# Patient Record
Sex: Male | Born: 1940 | Race: White | Hispanic: No | Marital: Married | Smoking: Former smoker
Health system: Southern US, Community
[De-identification: ages and names within clinical notes are randomized; demographics above are authoritative.]

## PROBLEM LIST (undated history)

## (undated) DIAGNOSIS — H3530 Unspecified macular degeneration: Secondary | ICD-10-CM

## (undated) DIAGNOSIS — R918 Other nonspecific abnormal finding of lung field: Secondary | ICD-10-CM

## (undated) DIAGNOSIS — K649 Unspecified hemorrhoids: Secondary | ICD-10-CM

## (undated) DIAGNOSIS — Z9889 Other specified postprocedural states: Secondary | ICD-10-CM

## (undated) DIAGNOSIS — Z972 Presence of dental prosthetic device (complete) (partial): Secondary | ICD-10-CM

## (undated) DIAGNOSIS — C61 Malignant neoplasm of prostate: Secondary | ICD-10-CM

## (undated) DIAGNOSIS — E785 Hyperlipidemia, unspecified: Secondary | ICD-10-CM

## (undated) DIAGNOSIS — J449 Chronic obstructive pulmonary disease, unspecified: Secondary | ICD-10-CM

## (undated) DIAGNOSIS — K5730 Diverticulosis of large intestine without perforation or abscess without bleeding: Secondary | ICD-10-CM

## (undated) DIAGNOSIS — R0600 Dyspnea, unspecified: Secondary | ICD-10-CM

## (undated) DIAGNOSIS — J302 Other seasonal allergic rhinitis: Secondary | ICD-10-CM

## (undated) DIAGNOSIS — I440 Atrioventricular block, first degree: Secondary | ICD-10-CM

## (undated) DIAGNOSIS — I2510 Atherosclerotic heart disease of native coronary artery without angina pectoris: Secondary | ICD-10-CM

## (undated) DIAGNOSIS — M1990 Unspecified osteoarthritis, unspecified site: Secondary | ICD-10-CM

## (undated) DIAGNOSIS — Z923 Personal history of irradiation: Secondary | ICD-10-CM

## (undated) DIAGNOSIS — I712 Thoracic aortic aneurysm, without rupture, unspecified: Secondary | ICD-10-CM

## (undated) DIAGNOSIS — Q6100 Congenital renal cyst, unspecified: Secondary | ICD-10-CM

## (undated) DIAGNOSIS — Z86010 Personal history of colonic polyps: Secondary | ICD-10-CM

## (undated) DIAGNOSIS — T7840XA Allergy, unspecified, initial encounter: Secondary | ICD-10-CM

## (undated) DIAGNOSIS — J189 Pneumonia, unspecified organism: Secondary | ICD-10-CM

## (undated) DIAGNOSIS — I10 Essential (primary) hypertension: Secondary | ICD-10-CM

## (undated) DIAGNOSIS — Z860101 Personal history of adenomatous and serrated colon polyps: Secondary | ICD-10-CM

## (undated) DIAGNOSIS — Z85828 Personal history of other malignant neoplasm of skin: Secondary | ICD-10-CM

## (undated) DIAGNOSIS — J432 Centrilobular emphysema: Secondary | ICD-10-CM

## (undated) DIAGNOSIS — J479 Bronchiectasis, uncomplicated: Secondary | ICD-10-CM

## (undated) DIAGNOSIS — I77819 Aortic ectasia, unspecified site: Secondary | ICD-10-CM

## (undated) DIAGNOSIS — R0609 Other forms of dyspnea: Secondary | ICD-10-CM

## (undated) DIAGNOSIS — H269 Unspecified cataract: Secondary | ICD-10-CM

## (undated) DIAGNOSIS — J439 Emphysema, unspecified: Secondary | ICD-10-CM

## (undated) DIAGNOSIS — H4010X0 Unspecified open-angle glaucoma, stage unspecified: Secondary | ICD-10-CM

## (undated) DIAGNOSIS — Z87442 Personal history of urinary calculi: Secondary | ICD-10-CM

## (undated) DIAGNOSIS — C801 Malignant (primary) neoplasm, unspecified: Secondary | ICD-10-CM

## (undated) HISTORY — DX: Emphysema, unspecified: J43.9

## (undated) HISTORY — DX: Malignant (primary) neoplasm, unspecified: C80.1

## (undated) HISTORY — PX: CARDIOVASCULAR STRESS TEST: SHX262

## (undated) HISTORY — DX: Atherosclerotic heart disease of native coronary artery without angina pectoris: I25.10

## (undated) HISTORY — PX: COLONOSCOPY: SHX174

## (undated) HISTORY — PX: PROSTATE BIOPSY: SHX241

## (undated) HISTORY — PX: POLYPECTOMY: SHX149

## (undated) HISTORY — DX: Unspecified osteoarthritis, unspecified site: M19.90

## (undated) HISTORY — DX: Unspecified macular degeneration: H35.30

## (undated) HISTORY — PX: CARDIAC CATHETERIZATION: SHX172

## (undated) HISTORY — DX: Unspecified cataract: H26.9

## (undated) HISTORY — PX: SHOULDER OPEN ROTATOR CUFF REPAIR: SHX2407

## (undated) HISTORY — DX: Allergy, unspecified, initial encounter: T78.40XA

## (undated) HISTORY — PX: COLONOSCOPY W/ POLYPECTOMY: SHX1380

## (undated) HISTORY — DX: Essential (primary) hypertension: I10

## (undated) HISTORY — PX: OTHER SURGICAL HISTORY: SHX169

---

## 1999-03-12 ENCOUNTER — Other Ambulatory Visit: Admission: RE | Admit: 1999-03-12 | Discharge: 1999-03-12 | Payer: Self-pay | Admitting: Gastroenterology

## 1999-03-12 ENCOUNTER — Encounter (INDEPENDENT_AMBULATORY_CARE_PROVIDER_SITE_OTHER): Payer: Self-pay | Admitting: Specialist

## 1999-04-09 ENCOUNTER — Encounter (INDEPENDENT_AMBULATORY_CARE_PROVIDER_SITE_OTHER): Payer: Self-pay | Admitting: Specialist

## 1999-04-09 ENCOUNTER — Ambulatory Visit (HOSPITAL_COMMUNITY): Admission: RE | Admit: 1999-04-09 | Discharge: 1999-04-09 | Payer: Self-pay | Admitting: Gastroenterology

## 1999-06-19 ENCOUNTER — Encounter: Payer: Self-pay | Admitting: General Surgery

## 1999-06-24 ENCOUNTER — Encounter (INDEPENDENT_AMBULATORY_CARE_PROVIDER_SITE_OTHER): Payer: Self-pay | Admitting: Specialist

## 1999-06-24 ENCOUNTER — Ambulatory Visit (HOSPITAL_COMMUNITY): Admission: RE | Admit: 1999-06-24 | Discharge: 1999-06-24 | Payer: Self-pay | Admitting: General Surgery

## 1999-07-03 HISTORY — PX: HEMORRHOID SURGERY: SHX153

## 2000-02-11 ENCOUNTER — Ambulatory Visit (HOSPITAL_COMMUNITY): Admission: RE | Admit: 2000-02-11 | Discharge: 2000-02-11 | Payer: Self-pay | Admitting: Gastroenterology

## 2000-02-11 ENCOUNTER — Encounter (INDEPENDENT_AMBULATORY_CARE_PROVIDER_SITE_OTHER): Payer: Self-pay | Admitting: Specialist

## 2000-05-28 ENCOUNTER — Emergency Department (HOSPITAL_COMMUNITY): Admission: EM | Admit: 2000-05-28 | Discharge: 2000-05-28 | Payer: Self-pay | Admitting: Emergency Medicine

## 2000-05-28 ENCOUNTER — Encounter: Payer: Self-pay | Admitting: Emergency Medicine

## 2000-06-02 ENCOUNTER — Emergency Department (HOSPITAL_COMMUNITY): Admission: EM | Admit: 2000-06-02 | Discharge: 2000-06-02 | Payer: Self-pay | Admitting: Emergency Medicine

## 2000-06-10 ENCOUNTER — Encounter: Admission: RE | Admit: 2000-06-10 | Discharge: 2000-08-09 | Payer: Self-pay | Admitting: Internal Medicine

## 2001-02-09 ENCOUNTER — Ambulatory Visit (HOSPITAL_COMMUNITY): Admission: RE | Admit: 2001-02-09 | Discharge: 2001-02-09 | Payer: Self-pay | Admitting: Gastroenterology

## 2001-02-17 ENCOUNTER — Inpatient Hospital Stay (HOSPITAL_COMMUNITY): Admission: EM | Admit: 2001-02-17 | Discharge: 2001-02-18 | Payer: Self-pay | Admitting: Emergency Medicine

## 2001-10-12 ENCOUNTER — Encounter: Payer: Self-pay | Admitting: Internal Medicine

## 2001-10-12 ENCOUNTER — Encounter: Admission: RE | Admit: 2001-10-12 | Discharge: 2001-10-12 | Payer: Self-pay | Admitting: Internal Medicine

## 2002-04-11 ENCOUNTER — Emergency Department (HOSPITAL_COMMUNITY): Admission: EM | Admit: 2002-04-11 | Discharge: 2002-04-11 | Payer: Self-pay | Admitting: Emergency Medicine

## 2002-04-11 ENCOUNTER — Encounter: Payer: Self-pay | Admitting: Emergency Medicine

## 2002-09-04 ENCOUNTER — Encounter: Payer: Self-pay | Admitting: Cardiology

## 2002-09-04 ENCOUNTER — Ambulatory Visit (HOSPITAL_COMMUNITY): Admission: RE | Admit: 2002-09-04 | Discharge: 2002-09-04 | Payer: Self-pay | Admitting: Cardiology

## 2003-03-15 ENCOUNTER — Ambulatory Visit (HOSPITAL_COMMUNITY): Admission: RE | Admit: 2003-03-15 | Discharge: 2003-03-15 | Payer: Self-pay | Admitting: Gastroenterology

## 2003-11-23 ENCOUNTER — Ambulatory Visit: Payer: Self-pay | Admitting: Internal Medicine

## 2004-08-04 ENCOUNTER — Ambulatory Visit: Payer: Self-pay | Admitting: Internal Medicine

## 2004-10-24 ENCOUNTER — Ambulatory Visit: Payer: Self-pay | Admitting: Internal Medicine

## 2004-11-04 ENCOUNTER — Ambulatory Visit: Payer: Self-pay | Admitting: Internal Medicine

## 2005-06-17 ENCOUNTER — Ambulatory Visit: Payer: Self-pay | Admitting: Internal Medicine

## 2005-11-18 ENCOUNTER — Ambulatory Visit: Payer: Self-pay | Admitting: Internal Medicine

## 2006-01-11 ENCOUNTER — Ambulatory Visit: Payer: Self-pay | Admitting: Internal Medicine

## 2006-01-15 ENCOUNTER — Ambulatory Visit: Payer: Self-pay | Admitting: Internal Medicine

## 2006-01-15 LAB — CONVERTED CEMR LAB
Cholesterol: 109 mg/dL (ref 0–200)
HDL: 30.2 mg/dL — ABNORMAL LOW (ref >39.0)

## 2006-04-05 ENCOUNTER — Ambulatory Visit: Payer: Self-pay | Admitting: Gastroenterology

## 2006-04-12 ENCOUNTER — Encounter (INDEPENDENT_AMBULATORY_CARE_PROVIDER_SITE_OTHER): Payer: Self-pay | Admitting: Specialist

## 2006-04-12 ENCOUNTER — Ambulatory Visit: Payer: Self-pay | Admitting: Gastroenterology

## 2006-09-24 ENCOUNTER — Ambulatory Visit: Payer: Self-pay | Admitting: Internal Medicine

## 2006-09-24 DIAGNOSIS — J309 Allergic rhinitis, unspecified: Secondary | ICD-10-CM | POA: Insufficient documentation

## 2006-09-24 DIAGNOSIS — I781 Nevus, non-neoplastic: Secondary | ICD-10-CM

## 2006-09-24 DIAGNOSIS — J45990 Exercise induced bronchospasm: Secondary | ICD-10-CM | POA: Insufficient documentation

## 2006-09-24 LAB — CONVERTED CEMR LAB
HDL goal, serum: 40 mg/dL
LDL Goal: 130 mg/dL

## 2006-12-03 ENCOUNTER — Ambulatory Visit: Payer: Self-pay | Admitting: Internal Medicine

## 2006-12-03 DIAGNOSIS — N400 Enlarged prostate without lower urinary tract symptoms: Secondary | ICD-10-CM

## 2006-12-03 DIAGNOSIS — I10 Essential (primary) hypertension: Secondary | ICD-10-CM

## 2006-12-03 DIAGNOSIS — I2510 Atherosclerotic heart disease of native coronary artery without angina pectoris: Secondary | ICD-10-CM

## 2006-12-03 DIAGNOSIS — E785 Hyperlipidemia, unspecified: Secondary | ICD-10-CM

## 2006-12-04 LAB — CONVERTED CEMR LAB
ALT: 26 units/L (ref 0–53)
AST: 31 units/L (ref 0–37)
Albumin: 4.1 g/dL (ref 3.5–5.2)
Alkaline Phosphatase: 40 units/L (ref 39–117)
BUN: 18 mg/dL (ref 6–23)
Basophils Absolute: 0 10*3/uL (ref 0.0–0.1)
Basophils Relative: 0.7 % (ref 0.0–1.0)
Bilirubin, Direct: 0.2 mg/dL (ref 0.0–0.3)
CO2: 28 meq/L (ref 19–32)
Calcium: 9.2 mg/dL (ref 8.4–10.5)
Chloride: 106 meq/L (ref 96–112)
Creatinine, Ser: 1.7 mg/dL — ABNORMAL HIGH (ref 0.4–1.5)
Eosinophils Absolute: 0.2 10*3/uL (ref 0.0–0.6)
Eosinophils Relative: 3.1 % (ref 0.0–5.0)
GFR calc Af Amer: 52 mL/min
GFR calc non Af Amer: 43 mL/min
Glucose, Bld: 94 mg/dL (ref 70–99)
HCT: 40.8 % (ref 39.0–52.0)
Hemoglobin: 14.6 g/dL (ref 13.0–17.0)
Lymphocytes Relative: 37.7 % (ref 12.0–46.0)
MCHC: 35.7 g/dL (ref 30.0–36.0)
MCV: 91.3 fL (ref 78.0–100.0)
Monocytes Absolute: 0.5 10*3/uL (ref 0.2–0.7)
Monocytes Relative: 7.9 % (ref 3.0–11.0)
Neutro Abs: 3.6 10*3/uL (ref 1.4–7.7)
Neutrophils Relative %: 50.6 % (ref 43.0–77.0)
PSA: 0.95 ng/mL (ref 0.10–4.00)
Platelets: 218 10*3/uL (ref 150–400)
Potassium: 3.4 meq/L — ABNORMAL LOW (ref 3.5–5.1)
RBC: 4.47 M/uL (ref 4.22–5.81)
RDW: 11.8 % (ref 11.5–14.6)
Sodium: 140 meq/L (ref 135–145)
TSH: 1.62 microintl units/mL (ref 0.35–5.50)
Total Bilirubin: 1.1 mg/dL (ref 0.3–1.2)
Total Protein: 7 g/dL (ref 6.0–8.3)
WBC: 6.9 10*3/uL (ref 4.5–10.5)

## 2006-12-06 ENCOUNTER — Encounter (INDEPENDENT_AMBULATORY_CARE_PROVIDER_SITE_OTHER): Payer: Self-pay | Admitting: *Deleted

## 2007-01-28 ENCOUNTER — Telehealth (INDEPENDENT_AMBULATORY_CARE_PROVIDER_SITE_OTHER): Payer: Self-pay | Admitting: *Deleted

## 2007-03-02 ENCOUNTER — Ambulatory Visit: Payer: Self-pay | Admitting: Internal Medicine

## 2007-03-28 ENCOUNTER — Ambulatory Visit: Payer: Self-pay | Admitting: Internal Medicine

## 2007-04-11 ENCOUNTER — Telehealth (INDEPENDENT_AMBULATORY_CARE_PROVIDER_SITE_OTHER): Payer: Self-pay | Admitting: *Deleted

## 2007-04-13 ENCOUNTER — Ambulatory Visit: Payer: Self-pay | Admitting: Internal Medicine

## 2007-04-14 ENCOUNTER — Encounter: Payer: Self-pay | Admitting: Internal Medicine

## 2007-04-14 ENCOUNTER — Encounter (INDEPENDENT_AMBULATORY_CARE_PROVIDER_SITE_OTHER): Payer: Self-pay | Admitting: *Deleted

## 2007-04-14 ENCOUNTER — Ambulatory Visit: Payer: Self-pay

## 2007-04-20 ENCOUNTER — Encounter (INDEPENDENT_AMBULATORY_CARE_PROVIDER_SITE_OTHER): Payer: Self-pay | Admitting: *Deleted

## 2007-04-28 ENCOUNTER — Telehealth (INDEPENDENT_AMBULATORY_CARE_PROVIDER_SITE_OTHER): Payer: Self-pay | Admitting: *Deleted

## 2007-05-26 ENCOUNTER — Ambulatory Visit: Payer: Self-pay | Admitting: Internal Medicine

## 2007-05-26 LAB — CONVERTED CEMR LAB: BUN: 18 mg/dL (ref 6–23)

## 2007-05-31 ENCOUNTER — Encounter: Payer: Self-pay | Admitting: Internal Medicine

## 2007-06-02 ENCOUNTER — Encounter (INDEPENDENT_AMBULATORY_CARE_PROVIDER_SITE_OTHER): Payer: Self-pay | Admitting: *Deleted

## 2007-08-25 ENCOUNTER — Telehealth (INDEPENDENT_AMBULATORY_CARE_PROVIDER_SITE_OTHER): Payer: Self-pay | Admitting: *Deleted

## 2007-08-30 ENCOUNTER — Telehealth (INDEPENDENT_AMBULATORY_CARE_PROVIDER_SITE_OTHER): Payer: Self-pay | Admitting: *Deleted

## 2007-10-28 ENCOUNTER — Telehealth (INDEPENDENT_AMBULATORY_CARE_PROVIDER_SITE_OTHER): Payer: Self-pay | Admitting: *Deleted

## 2007-11-03 ENCOUNTER — Telehealth: Payer: Self-pay | Admitting: Internal Medicine

## 2007-12-13 ENCOUNTER — Ambulatory Visit: Payer: Self-pay | Admitting: Internal Medicine

## 2007-12-13 DIAGNOSIS — Z860100 Personal history of colon polyps, unspecified: Secondary | ICD-10-CM | POA: Insufficient documentation

## 2007-12-13 LAB — CONVERTED CEMR LAB
Glucose, Urine, Semiquant: NEGATIVE
Nitrite: NEGATIVE
pH: 5

## 2007-12-15 ENCOUNTER — Encounter (INDEPENDENT_AMBULATORY_CARE_PROVIDER_SITE_OTHER): Payer: Self-pay | Admitting: *Deleted

## 2007-12-15 LAB — CONVERTED CEMR LAB
ALT: 34 units/L (ref 0–53)
Albumin: 4.3 g/dL (ref 3.5–5.2)
Alkaline Phosphatase: 46 units/L (ref 39–117)
Basophils Absolute: 0.1 10*3/uL (ref 0.0–0.1)
Bilirubin, Direct: 0.1 mg/dL (ref 0.0–0.3)
Cholesterol: 136 mg/dL (ref 0–200)
Eosinophils Absolute: 0.1 10*3/uL (ref 0.0–0.7)
GFR calc Af Amer: 60 mL/min
GFR calc non Af Amer: 50 mL/min
Glucose, Bld: 86 mg/dL (ref 70–99)
HCT: 43.9 % (ref 39.0–52.0)
LDL Cholesterol: 71 mg/dL (ref 0–99)
Lymphocytes Relative: 36.9 % (ref 12.0–46.0)
MCV: 94.6 fL (ref 78.0–100.0)
Monocytes Relative: 8.1 % (ref 3.0–12.0)
Neutro Abs: 3.5 10*3/uL (ref 1.4–7.7)
Neutrophils Relative %: 51.9 % (ref 43.0–77.0)
PSA: 1.31 ng/mL (ref 0.10–4.00)
Platelets: 202 10*3/uL (ref 150–400)
TSH: 1.91 microintl units/mL (ref 0.35–5.50)
Total Bilirubin: 1.4 mg/dL — ABNORMAL HIGH (ref 0.3–1.2)
Total CHOL/HDL Ratio: 3.7
Triglycerides: 141 mg/dL (ref 0–149)

## 2008-01-02 ENCOUNTER — Ambulatory Visit: Payer: Self-pay | Admitting: Internal Medicine

## 2008-01-02 LAB — CONVERTED CEMR LAB: OCCULT 3: NEGATIVE

## 2008-01-03 ENCOUNTER — Encounter (INDEPENDENT_AMBULATORY_CARE_PROVIDER_SITE_OTHER): Payer: Self-pay | Admitting: *Deleted

## 2008-03-08 ENCOUNTER — Telehealth (INDEPENDENT_AMBULATORY_CARE_PROVIDER_SITE_OTHER): Payer: Self-pay | Admitting: *Deleted

## 2008-05-22 ENCOUNTER — Telehealth (INDEPENDENT_AMBULATORY_CARE_PROVIDER_SITE_OTHER): Payer: Self-pay | Admitting: *Deleted

## 2008-05-24 ENCOUNTER — Telehealth (INDEPENDENT_AMBULATORY_CARE_PROVIDER_SITE_OTHER): Payer: Self-pay | Admitting: *Deleted

## 2008-12-03 ENCOUNTER — Telehealth (INDEPENDENT_AMBULATORY_CARE_PROVIDER_SITE_OTHER): Payer: Self-pay | Admitting: *Deleted

## 2009-01-15 ENCOUNTER — Telehealth (INDEPENDENT_AMBULATORY_CARE_PROVIDER_SITE_OTHER): Payer: Self-pay | Admitting: *Deleted

## 2009-02-21 ENCOUNTER — Telehealth (INDEPENDENT_AMBULATORY_CARE_PROVIDER_SITE_OTHER): Payer: Self-pay | Admitting: *Deleted

## 2009-02-21 ENCOUNTER — Ambulatory Visit: Payer: Self-pay | Admitting: Internal Medicine

## 2009-02-21 DIAGNOSIS — N529 Male erectile dysfunction, unspecified: Secondary | ICD-10-CM

## 2009-02-21 LAB — CONVERTED CEMR LAB
Nitrite: NEGATIVE
Specific Gravity, Urine: 1.02
WBC Urine, dipstick: NEGATIVE
pH: 5

## 2009-02-25 LAB — CONVERTED CEMR LAB
ALT: 29 units/L (ref 0–53)
BUN: 17 mg/dL (ref 6–23)
Calcium: 9.2 mg/dL (ref 8.4–10.5)
Cholesterol: 137 mg/dL (ref 0–200)
Direct LDL: 74.7 mg/dL
GFR calc non Af Amer: 49.55 mL/min (ref >60)
Glucose, Bld: 96 mg/dL (ref 70–99)
Lymphs Abs: 1.9 10*3/uL (ref 0.7–4.0)
MCV: 91.1 fL (ref 78.0–100.0)
Monocytes Relative: 9.8 % (ref 3.0–12.0)
Neutro Abs: 2.9 10*3/uL (ref 1.4–7.7)
Neutrophils Relative %: 50.3 % (ref 43.0–77.0)
PSA: 1.72 ng/mL (ref 0.10–4.00)
Platelets: 232 10*3/uL (ref 150.0–400.0)
RDW: 12.1 % (ref 11.5–14.6)
Sodium: 141 meq/L (ref 135–145)
TSH: 3.48 microintl units/mL (ref 0.35–5.50)
Total Protein: 7.4 g/dL (ref 6.0–8.3)
Triglycerides: 231 mg/dL — ABNORMAL HIGH (ref 0.0–149.0)
VLDL: 46.2 mg/dL — ABNORMAL HIGH (ref 0.0–40.0)
WBC: 5.7 10*3/uL (ref 4.5–10.5)

## 2009-03-07 ENCOUNTER — Ambulatory Visit: Payer: Self-pay | Admitting: Internal Medicine

## 2009-03-07 ENCOUNTER — Encounter (INDEPENDENT_AMBULATORY_CARE_PROVIDER_SITE_OTHER): Payer: Self-pay | Admitting: *Deleted

## 2009-03-07 LAB — CONVERTED CEMR LAB
OCCULT 1: NEGATIVE
OCCULT 2: NEGATIVE
OCCULT 3: NEGATIVE

## 2009-03-19 ENCOUNTER — Telehealth (INDEPENDENT_AMBULATORY_CARE_PROVIDER_SITE_OTHER): Payer: Self-pay | Admitting: *Deleted

## 2009-04-08 ENCOUNTER — Ambulatory Visit: Payer: Self-pay | Admitting: Internal Medicine

## 2009-04-08 DIAGNOSIS — J069 Acute upper respiratory infection, unspecified: Secondary | ICD-10-CM | POA: Insufficient documentation

## 2009-04-15 ENCOUNTER — Telehealth (INDEPENDENT_AMBULATORY_CARE_PROVIDER_SITE_OTHER): Payer: Self-pay | Admitting: *Deleted

## 2009-05-16 ENCOUNTER — Encounter: Payer: Self-pay | Admitting: Internal Medicine

## 2009-10-01 ENCOUNTER — Telehealth (INDEPENDENT_AMBULATORY_CARE_PROVIDER_SITE_OTHER): Payer: Self-pay | Admitting: *Deleted

## 2009-12-16 ENCOUNTER — Telehealth (INDEPENDENT_AMBULATORY_CARE_PROVIDER_SITE_OTHER): Payer: Self-pay | Admitting: *Deleted

## 2010-01-27 ENCOUNTER — Telehealth: Payer: Self-pay | Admitting: Internal Medicine

## 2010-02-11 NOTE — Letter (Signed)
Summary: Viann Fish MD Cardiology  Viann Fish MD Cardiology   Imported By: Lanelle Bal 05/27/2009 09:15:22  _____________________________________________________________________  External Attachment:    Type:   Image     Comment:   External Document

## 2010-02-11 NOTE — Progress Notes (Signed)
Summary: refill request  Phone Note Refill Request Call back at [phone] Message from:  Pharmacy on October 01, 2009 9:49 AM  Refills Requested: Medication #1:  FENOFIBRATE 200 MG  CAPS take one capsule by mouth each morning   Dosage confirmed as above?Dosage Confirmed   Supply Requested: 1 month   Last Refilled: 08/28/2009 [facility]  Next Appointment Scheduled: none Initial call taken by: Lavell Islam,  October 01, 2009 9:52 AM    Prescriptions: FENOFIBRATE 200 MG  CAPS (FENOFIBRATE MICRONIZED) take one capsule by mouth each morning  #30 x 5   Entered by:   Shonna Chock CMA   Authorized by:   Marga Melnick MD   Signed by:   Shonna Chock CMA on 10/01/2009   Method used:   Faxed to ...       [facility] (retail)       [address] [place]       [address]       [place], [place]  [address]       Ph: [phone]       Fax: [phone]   RxID:   [id number]

## 2010-02-11 NOTE — Assessment & Plan Note (Signed)
Summary: CHEST CONGESTION?/KDC   Vital Signs:  Patient profile:   70 year old male Weight:      228.6 pounds Temp:     98.7 degrees F oral Pulse rate:   72 / minute Resp:     15 per minute BP sitting:   124 / 80  (left arm) Cuff size:   large  Vitals Entered By: Shonna Chock (April 08, 2009 11:38 AM) CC: Cough(productive-discolored) and chest congestion since last Wed Comments REVIEWED MED LIST, PATIENT AGREED DOSE AND INSTRUCTION CORRECT    CC:  Cough(productive-discolored) and chest congestion since last Wed.  History of Present Illness: Onset 04/03/2009 as head & chest congestion with thick green mucus & DOE. Some PNDyspnea X 4-5 days . Associated  are wheezing , snoring, pleuritic chest pain & tightness and  fatigue. No f,c, or sweats.Pain resolves with productive cough. Quit smoking in 1972. PMH of EIB. PMH of PNA X 3, last in 2002. Rx: Mucinex , Neti pot w/o benefit. Flu shot & PNA vaccine up to date.  Allergies: 1)  ! Sulfa 2)  ! Codeine  Review of Systems General:  Denies chills, fever, and sweats. ENT:  Complains of nasal congestion; denies sinus pressure; No frontal headache, facial pain . Yellow from head; dental pain.. CV:  Complains of difficulty breathing at night and difficulty breathing while lying down. Resp:  Complains of chest pain with inspiration, pleuritic, shortness of breath, sputum productive, and wheezing; denies coughing up blood. MS:  Denies muscle aches.  Physical Exam  General:  Non toxic appearing,well-nourished,in no acute distress; alert,appropriate and cooperative throughout examination Ears:  External ear exam shows no significant lesions or deformities.  Otoscopic examination reveals clear canals, tympanic membranes are intact bilaterally without bulging, retraction, inflammation or discharge. Hearing is grossly normal bilaterally. Nose:  External nasal examination shows no deformity or inflammation. Nasal mucosa are pink and moist without  lesions or exudates. septal dislocation Mouth:  Oral mucosa and oropharynx without lesions or exudates.  Upper plate, lower partial. Mild pharyngeal erythema.   Lungs:  normal respiratory effort, no intercostal retractions, no accessory muscle use, R base crackles, and L base crackles. No whispered pectroriloquy or "E to A " changes.Dry cough; no increased WOB. O2 sats 98%   Heart:  Normal rate and regular rhythm. S1 and S2 normal without gallop, murmur, click, rub . S4 with slurring Cervical Nodes:  No lymphadenopathy noted Axillary Nodes:  No palpable lymphadenopathy Psych:  memory intact for recent and remote, normally interactive, and good eye contact.     Impression & Recommendations:  Problem # 1:  BRONCHITIS-ACUTE (ICD-466.0)  Probable RAD component His updated medication list for this problem includes:    Ventolin Hfa 108 (90 Base) Mcg/act Aers (Albuterol sulfate) .Marland Kitchen... 1 -2 puffs q 4 hrs as needed    Azithromycin 250 Mg Tabs (Azithromycin) .Marland Kitchen... As per pack    Cefuroxime Axetil 500 Mg Tabs (Cefuroxime axetil) .Marland Kitchen... 1 two times a day    Proair Hfa 108 (90 Base) Mcg/act Aers (Albuterol sulfate) .Marland Kitchen... 1-2 puffs every 4 hrs as needed    Symbicort 160-4.5 Mcg/act Aero (Budesonide-formoterol fumarate) .Marland Kitchen... 1-2 puffs every 12 hrs; gargle & spit after use  Orders: T-2 View CXR (71020TC)  Problem # 2:  URI (ICD-465.9)  Orders: T-2 View CXR (71020TC)  Complete Medication List: 1)  Fenofibrate 200 Mg Caps (Fenofibrate micronized) .... Take one capsule by mouth each morning 2)  Vytorin 10-40 Mg Tabs (Ezetimibe-simvastatin) .... Take one  tablet daily 3)  Levitra 20 Mg Tabs (Vardenafil hcl) .... 1/2-1 by mouth as needed 4)  Garlic  .... 2 once daily 5)  Fish Oil 1200 Mg Caps (Omega-3 fatty acids) .... 2 by mouth once daily 6)  Flax Seed 1300mg   .... 2 once daily 7)  B Complex  .... Once daily 8)  Baby Asa 81mg   .... 1 once daily 9)  Mucus Prn  10)  Ventolin Hfa 108 (90 Base)  Mcg/act Aers (Albuterol sulfate) .Marland Kitchen.. 1 -2 puffs q 4 hrs as needed 11)  Metoprolol Succinate 100 Mg Xr24h-tab (Metoprolol succinate) .... 1/4 of a tab daily 12)  Multivitamins Tabs (Multiple vitamin) .Marland Kitchen.. 1 by mouth once daily 13)  Vitamin C 1000 Mg Tabs (Ascorbic acid) .... 2am, 2pm 14)  Cyclobenzaprine Hcl 5 Mg Tabs (Cyclobenzaprine hcl) .... Take 1 to 2 tablets at bedtime as needed 15)  Coq 300mg   .... 1 by mouth once daily 16)  Azithromycin 250 Mg Tabs (Azithromycin) .... As per pack 17)  Cefuroxime Axetil 500 Mg Tabs (Cefuroxime axetil) .Marland Kitchen.. 1 two times a day 18)  Proair Hfa 108 (90 Base) Mcg/act Aers (Albuterol sulfate) .Marland Kitchen.. 1-2 puffs every 4 hrs as needed 19)  Symbicort 160-4.5 Mcg/act Aero (Budesonide-formoterol fumarate) .Marland Kitchen.. 1-2 puffs every 12 hrs; gargle & spit after use  Patient Instructions: 1)  Symbicort 2 puffs every 12 hrs ; gargle & spit after use.Pro Air 1-2 puffs every 4 hrs as needed wheezing. 2)  Drink as much fluid as you can tolerate for the next few days. Prescriptions: PROAIR HFA 108 (90 BASE) MCG/ACT AERS (ALBUTEROL SULFATE) 1-2 puffs every 4 hrs as needed  #1 x 0   Entered and Authorized by:   Marga Melnick MD   Signed by:   Marga Melnick MD on 04/08/2009   Method used:   Historical   RxID:   [id number] CEFUROXIME AXETIL 500 MG TABS (CEFUROXIME AXETIL) 1 two times a day  #14 x 0   Entered and Authorized by:   Marga Melnick MD   Signed by:   Marga Melnick MD on 04/08/2009   Method used:   Faxed to ...       [facility] (retail)       [address] [place]       [address]       [place], [place]  [address]       Ph: [phone]       Fax: [phone]   RxID:   [id number] AZITHROMYCIN 250 MG TABS (AZITHROMYCIN) as per pack  #1 x 0   Entered and Authorized by:   Marga Melnick MD   Signed by:   Marga Melnick MD on 04/08/2009   Method used:   Faxed to ...       [facility] (retail)       [address] [place]       [address]        [place], [place]  [address]       Ph: [phone]       Fax: [phone]   RxID:   [id number]

## 2010-02-11 NOTE — Progress Notes (Signed)
Summary: REFILL  Phone Note Refill Request Message from:  Fax from Pharmacy on April 15, 2009 12:00 PM  Refills Requested: Medication #1:  CEFUROXIME AXETIL 500 MG TABS 1 two times a day [facility] FAX [phone]   Method Requested: Fax to Local Pharmacy Next Appointment Scheduled: NO APPT Initial call taken by: Barb Merino,  April 15, 2009 12:00 PM  Follow-up for Phone Call        Left message for patient to return call when avaliable, reason for call:  ? what symptoms he is current having, the refill request is for an antibiotic (which we dont usually refill-unless valid reason) Follow-up by: Shonna Chock,  April 16, 2009 10:17 AM  Additional Follow-up for Phone Call Additional follow up Details #1::        Patient called to say he still has a lot of Head congestion and coughing up green stuff and thats why he feels he needs another round of ABX.  Dr.Hopper please advise Additional Follow-up by: Shonna Chock,  April 16, 2009 1:24 PM

## 2010-02-11 NOTE — Letter (Signed)
Summary: Results Follow up Letter  [facility]  [address] [place], [place] [address]   Phone: [phone]  Fax: [phone]    03/07/2009 MRN: [id number]  [facility] [address] [place], [place]  [address]  Dear Mr. BERGQUIST,  The following are the results of your recent test(s):  Test         Result    Pap Smear:        Normal _____  Not Normal _____ Comments: ______________________________________________________ Cholesterol: LDL(Bad cholesterol):         Your goal is less than:         HDL (Good cholesterol):       Your goal is more than: Comments:  ______________________________________________________ Mammogram:        Normal _____  Not Normal _____ Comments:  ___________________________________________________________________ Hemoccult:        Normal _X____  Not normal _______ Comments:    _____________________________________________________________________ Other Tests:    We routinely do not discuss normal results over the telephone.  If you desire a copy of the results, or you have any questions about this information we can discuss them at your next office visit.   Sincerely,

## 2010-02-11 NOTE — Assessment & Plan Note (Signed)
Summary: YEARLY EXAM, FASTING LABS///SPH---PER INS REP, DONT CHARGE COPAY   Vital Signs:  Patient profile:   70 year old male Height:      72.5 inches Weight:      225 pounds BMI:     30.20 Temp:     98.8 degrees F oral Pulse rate:   59 / minute Resp:     14 per minute BP sitting:   102 / 66  (left arm) Cuff size:   large  Vitals Entered By: Shonna Chock (February 21, 2009 8:19 AM)  Comments REVIEWED MED LIST, PATIENT AGREED DOSE AND INSTRUCTION CORRECT  Flu Vaccine Consent Questions     Do you have a history of severe allergic reactions to this vaccine? no    Any prior history of allergic reactions to egg and/or gelatin? no    Do you have a sensitivity to the preservative Thimersol? no    Do you have a past history of Guillan-Barre Syndrome? no    Do you currently have an acute febrile illness? no    Have you ever had a severe reaction to latex? no    Vaccine information given and explained to patient? yes    Are you currently pregnant? no    Lot Number:[id number]   Exp Date:07/11/2009   Site Given Right Deltoid IM    History of Present Illness: Mr Nuttall is here for a [employer]/ [employer] physical. Last week he had fever & myalgias/ arthralgias (2on 10 nscale); resolution with NSAIDS & Mucinex. No Flu shot.  Preventive Screening-Counseling & Management  Alcohol-Tobacco     Smoking Status: quit  Allergies: 1)  ! Sulfa 2)  ! Codeine  Past History:  Past Medical History: HYPERPLASIA PROSTATE UNS W/O UR OBST & OTH LUTS (ICD-600.90) C A D (ICD-414.00) HYPERTENSION (ICD-401.9) HYPERLIPIDEMIA (ICD-272.4) NEVUS, NON-NEOPLASTIC (ICD-448.1) RHINITIS, ALLERGIC NOS (ICD-477.9), Immunothrapy, PMH of  from  Dr  Corinda Gubler EXERCISE INDUCED ASTHMA (ICD-493.81) Colonic polyps, hx of, hyperplastic  Past Surgical History: Catheterization  2004 : 4 vessel disease, Dr Viann Fish Colon polypectomy 2008, Dr Jarold Motto, due 2011 Shoulder surgery RX2; L X1, Dr  Thomasena Edis Vasectomy Hemorrhoidectomy  Family History: Father: CVA, pacer Mother:d @  46 intraoperatively ,Gyn surgery for dysfunctional menses Siblings: bro MI @ 33, bro 2 stents; P uncle DM  Social History: [place]( modified) diet Married Alcohol use-no Regular exercise-no Retired Former Smoker: quit 1972  Review of Systems General:  Complains of sweats; denies chills and fever; Night sweats X 2 nights. Eyes:  Denies blurring, double vision, and vision loss-both eyes. ENT:  Denies difficulty swallowing, hoarseness, nasal congestion, sinus pressure, and sore throat; Intermittent R max sinus pressure. No purulence, facial pain , or frontal headache. CV:  Complains of shortness of breath with exertion; denies chest pain or discomfort, leg cramps with exertion, palpitations, swelling of feet, and swelling of hands. Resp:  Denies cough and sputum productive; Minor wheezing with recent URI. GI:  Denies bloody stools, constipation, dark tarry stools, diarrhea, indigestion, nausea, and vomiting; Intermittent brief lower abd pain past week. Occa loose stool X 1 month. GU:  Denies discharge, dysuria, hematuria, and nocturia. MS:  Complains of low back pain; denies joint pain, joint redness, joint swelling, mid back pain, and thoracic pain. Derm:  Denies changes in nail beds, dryness, and lesion(s). Neuro:  Complains of numbness and tingling; Occa N&T LUE . Psych:  Denies anxiety and depression. Endo:  Denies cold intolerance, excessive hunger, excessive thirst, excessive urination, and  heat intolerance. Heme:  Denies abnormal bruising and bleeding. Allergy:  Complains of itching eyes and sneezing; Perennial symptoms no better with Allergy shots.  Physical Exam  General:  well-nourished,in no acute distress; alert,appropriate and cooperative throughout examination Head:  Normocephalic and atraumatic without obvious abnormalities. Pattern  alopecia  Eyes:  No corneal or conjunctival  inflammation noted. Perrla. Funduscopic exam benign, without hemorrhages, exudates or papilledema. Slight ptosis bilaterally Ears:  External ear exam shows no significant lesions or deformities.  Otoscopic examination reveals clear canals, tympanic membranes are intact bilaterally without bulging, retraction, inflammation or discharge. Hearing is grossly normal bilaterally. Nose:  External nasal examination shows no deformity or inflammation. Nasal mucosa are pink and moist without lesions or exudates. septal dislocation to L Mouth:  Oral mucosa and oropharynx without lesions or exudates.  Upper plate; lower partial Neck:  No deformities, masses, or tenderness noted. Chest Wall:  R inferior thorax higher than L Lungs:  Normal respiratory effort, chest expands symmetrically. Lungs are clear to auscultation, no crackles or wheezes. Heart:  regular rhythm and bradycardia.   Slight slurring S2 Abdomen:  Bowel sounds positive,abdomen soft and non-tender without masses, organomegaly or hernias noted. Rectal:  No external abnormalities noted. Normal sphincter tone. No rectal masses or tenderness. Genitalia:  Testes bilaterally descended without nodularity, tenderness or masses. No scrotal masses or lesions. No penis lesions or urethral discharge. Prostate:  no nodules, no asymmetry, no induration, but  1+ enlarged.   Msk:  No deformity or scoliosis noted of thoracic or lumbar spine.   Pulses:  R and L carotid,radial,dorsalis pedis and posterior tibial pulses are full and equal bilaterally Extremities:  No clubbing, cyanosis, edema, or deformity noted with normal full range of motion of all joints.   Neurologic:  alert & oriented X3 and DTRs symmetrical and normal.   Skin:  Intact without suspicious lesions or rashes. Minor lipomata subcutaneously  Cervical Nodes:  No lymphadenopathy noted Axillary Nodes:  No palpable lymphadenopathy Inguinal Nodes:  No significant adenopathy Psych:  memory intact for  recent and remote, normally interactive, and good eye contact.     Impression & Recommendations:  Problem # 1:  PREVENTIVE HEALTH CARE (ICD-V70.0)  Orders: EKG w/ Interpretation ([address]) Venipuncture ([address]) TLB-Lipid Panel (80061-LIPID) TLB-BMP (Basic Metabolic Panel-BMET) (80048-METABOL) TLB-CBC Platelet - w/Differential (85025-CBCD) TLB-Hepatic/Liver Function Pnl (80076-HEPATIC) TLB-TSH (Thyroid Stimulating Hormone) (84443-TSH) TLB-PSA (Prostate Specific Antigen) (84153-PSA) UA Dipstick w/o Micro (manual) ([address])  Problem # 2:  C A D (ICD-414.00)  as per Dr Donnie Aho His updated medication list for this problem includes:    Metoprolol Succinate 100 Mg Xr24h-tab (Metoprolol succinate) .Marland Kitchen... 1/4 of a tab daily  Orders: EKG w/ Interpretation (93[address]) Venipuncture ([address])  Problem # 3:  HYPERTENSION (ICD-401.9)  Controlled His updated medication list for this problem includes:    Metoprolol Succinate 100 Mg Xr24h-tab (Metoprolol succinate) .Marland Kitchen... 1/4 of a tab daily  Orders: EKG w/ Interpretation (93[address]) Venipuncture (91478)  Problem # 4:  HYPERLIPIDEMIA (ICD-272.4)  His updated medication list for this problem includes:    Fenofibrate 200 Mg Caps (Fenofibrate micronized) .Marland Kitchen... Take one capsule by mouth each morning    Vytorin 10-40 Mg Tabs (Ezetimibe-simvastatin) .Marland Kitchen... Take one tablet daily  Orders: EKG w/ Interpretation (93[address]) Venipuncture ([address]) TLB-Lipid Panel (80061-LIPID)  Problem # 5:  HYPERPLASIA PROSTATE UNS W/O UR OBST & OTH LUTS (ICD-600.90)  Orders: EKG w/ Interpretation (93[address]) Venipuncture (13086) TLB-PSA (Prostate Specific Antigen) (84153-PSA)  Problem # 6:  ERECTILE DYSFUNCTION, ORGANIC (ICD-607.84)  His  updated medication list for this problem includes:    Levitra 20 Mg Tabs (Vardenafil hcl) .Marland Kitchen... 1/2-1 by mouth as needed  Orders: EKG w/ Interpretation (93000) Venipuncture ([address])  Problem # 7:  COLONIC POLYPS, HX OF (ICD-V12.72)  as per  Dr Jarold Motto  Orders: EKG w/ Interpretation (93[address]) Venipuncture ([address])  Complete Medication List: 1)  Fenofibrate 200 Mg Caps (Fenofibrate micronized) .... Take one capsule by mouth each morning 2)  Vytorin 10-40 Mg Tabs (Ezetimibe-simvastatin) .... Take one tablet daily 3)  Levitra 20 Mg Tabs (Vardenafil hcl) .... 1/2-1 by mouth as needed 4)  Garlic  .... 2 once daily 5)  Fish Oil 1200 Mg Caps (Omega-3 fatty acids) .... 2 by mouth once daily 6)  Flax Seed 1300mg   .... 2 once daily 7)  B Complex  .... Once daily 8)  Baby Asa 81mg   .... 1 once daily 9)  Mucus Prn  10)  Ventolin Hfa 108 (90 Base) Mcg/act Aers (Albuterol sulfate) .Marland Kitchen.. 1 -2 puffs q 4 hrs as needed 11)  Metoprolol Succinate 100 Mg Xr24h-tab (Metoprolol succinate) .... 1/4 of a tab daily 12)  Multivitamins Tabs (Multiple vitamin) .Marland Kitchen.. 1 by mouth once daily 13)  Vitamin C 1000 Mg Tabs (Ascorbic acid) .... 2am, 2pm 14)  Cyclobenzaprine Hcl 5 Mg Tabs (Cyclobenzaprine hcl) .... Take 1 to 2 tablets at bedtime as needed 15)  Coq 300mg   .... 1 by mouth once daily  Other Orders: Flu Vaccine 16yrs + ([address]) Administration Flu vaccine - MCR (B1478)  Patient Instructions: 1)  Check on cost of Crestor 20 mg once daily on your plan. Do not take Levitra  with NTG as discussed. Flu shot annually. Consider Shingles shot. Prescriptions: VIAGRA 100 MG  TABS (SILDENAFIL CITRATE) take one half or one tablet as needed  #6 x 6   Entered and Authorized by:   Marga Melnick MD   Signed by:   Marga Melnick MD on 02/21/2009   Method used:   Print then Give to Patient   RxID:   [id number]   Laboratory Results   Urine Tests   Date/Time Reported: February 21, 2009 9:38 AM   Routine Urinalysis   Color: yellow Appearance: Clear Glucose: negative   (Normal Range: Negative) Bilirubin: negative   (Normal Range: Negative) Ketone: negative   (Normal Range: Negative) Spec. Gravity: 1.020   (Normal Range: 1.003-1.035) Blood: negative    (Normal Range: Negative) pH: 5.0   (Normal Range: 5.0-8.0) Protein: negative   (Normal Range: Negative) Urobilinogen: negative   (Normal Range: 0-1) Nitrite: negative   (Normal Range: Negative) Leukocyte Esterace: negative   (Normal Range: Negative)    Comments: Floydene Flock  February 21, 2009 9:38 AM

## 2010-02-11 NOTE — Progress Notes (Signed)
Summary: REFILL  Phone Note Refill Request Message from:  Fax from Pharmacy on March 19, 2009 9:15 AM  Refills Requested: Medication #1:  FENOFIBRATE 200 MG  CAPS take one capsule by mouth each morning Geoffery Lyons [phone]   Method Requested: Fax to Local Pharmacy Next Appointment Scheduled: NO APPT Initial call taken by: Barb Merino,  March 19, 2009 9:15 AM    Prescriptions: FENOFIBRATE 200 MG  CAPS (FENOFIBRATE MICRONIZED) take one capsule by mouth each morning  #30 x 5   Entered by:   Shonna Chock   Authorized by:   Marga Melnick MD   Signed by:   Shonna Chock on 03/19/2009   Method used:   Faxed to ...       [facility] (retail)       [address] [place]       [address]       [place], [place]  [address]       Ph: [phone]       Fax: [phone]   RxID:   [id number]

## 2010-02-11 NOTE — Progress Notes (Signed)
Summary: Request for Med Change  Phone Note From Pharmacy Call back at [phone]   Caller: [facility] Summary of Call: Message left on VM: patient was given coupon for Viagra and it is expired. Patient would like to know if he could be swithched to Levitra or Cialis  Dr.Hopper please advise on med change./[facility]  February 21, 2009 4:08 PM   Follow-up for Phone Call        Sorry  about coupon. Levitra 20 mg # free allowed Follow-up by: Marga Melnick MD,  February 22, 2009 1:40 PM  Additional Follow-up for Phone Call Additional follow up Details #1::        Called the pharmacy and left RX change from Viagra to Levitra Additional Follow-up by: Shonna Chock,  February 22, 2009 2:03 PM    New/Updated Medications: LEVITRA 20 MG TABS (VARDENAFIL HCL) 1/2-1 by mouth as needed

## 2010-02-11 NOTE — Progress Notes (Signed)
Summary: REFILL  Phone Note Refill Request Message from:  Fax from Pharmacy on [facility] [phone]  Refills Requested: Medication #1:  FENOFIBRATE 200 MG  CAPS take one capsule by mouth each morning Initial call taken by: Barb Merino,  January 15, 2009 11:01 AM    Prescriptions: FENOFIBRATE 200 MG  CAPS (FENOFIBRATE MICRONIZED) take one capsule by mouth each morning  #30 x 1   Entered by:   Shonna Chock   Authorized by:   Marga Melnick MD   Signed by:   Shonna Chock on 01/15/2009   Method used:   Faxed to ...       [facility] (retail)       [address] [place]       [address]       [place], [place]  [address]       Ph: [phone]       Fax: [phone]   RxID:   [id number]

## 2010-02-11 NOTE — Progress Notes (Signed)
Summary: metoprolol refill   Phone Note Refill Request Message from:  Fax from Pharmacy on December 16, 2009 2:05 PM  Refills Requested: Medication #1:  METOPROLOL SUCCINATE 100 MG XR24H-TAB 1/4 OF A TAB DAILY [facility] fax [phone]  Initial call taken by: Okey Regal [place],  December 16, 2009 2:05 PM    Prescriptions: METOPROLOL SUCCINATE 100 MG XR24H-TAB (METOPROLOL SUCCINATE) 1/4 OF A TAB DAILY  #30 x 1   Entered by:   Doristine Devoid CMA   Authorized by:   Marga Melnick MD   Signed by:   Doristine Devoid CMA on 12/16/2009   Method used:   Faxed to ...       [facility] (retail)       [address] [place]       [address]       [place], [place]  [address]       Ph: [phone]       Fax: [phone]   RxID:   [id number]

## 2010-02-13 NOTE — Progress Notes (Signed)
Summary: Rx request  Phone Note Call from Patient Call back at Home Phone [phone]   Caller: Patient Call For: Marga Melnick MD Summary of Call: Patient is having the same symptoms his wife had on Friday and wants to know if Alfonse Flavors will call in an antibiotic.  Symptoms are Drainage, nausea, diarrhea.  Pharmacy: [facility]  Initial call taken by: Barnie Mort,  January 27, 2010 2:02 PM  Follow-up for Phone Call        antibiotics will very likely aggravate diarrhea; he should be seen if having fever or purulent secretions Follow-up by: Marga Melnick MD,  January 27, 2010 2:20 PM  Additional Follow-up for Phone Call Additional follow up Details #1::        Patient notified and states that he is having sinus issues/congestion and loose stools due to that. He will call back for appt if needed. Additional Follow-up by: Lucious Groves CMA,  January 27, 2010 3:48 PM

## 2010-04-01 ENCOUNTER — Other Ambulatory Visit: Payer: Self-pay | Admitting: Internal Medicine

## 2010-04-01 DIAGNOSIS — E785 Hyperlipidemia, unspecified: Secondary | ICD-10-CM

## 2010-04-01 MED ORDER — FENOFIBRATE MICRONIZED 200 MG PO CAPS: 200.0000 mg | ORAL_CAPSULE | Freq: Every day | ORAL | Status: DC

## 2010-04-25 ENCOUNTER — Other Ambulatory Visit: Payer: Self-pay | Admitting: *Deleted

## 2010-04-25 MED ORDER — METOPROLOL TARTRATE 100 MG PO TABS: ORAL_TABLET | ORAL | Status: DC

## 2010-05-30 NOTE — Op Note (Signed)
[place]  Patient:    ADALBERT, ALBERTO                        MRN: [id number] Proc. Date: 06/24/99 Adm. Date:  [id number] Disc. Date: [id number] Attending:  Carson Myrtle                           Operative Report  PREOPERATIVE DIAGNOSIS:  Internal and external hemorrhoids.  POSTOPERATIVE DIAGNOSIS:  Internal and external hemorrhoids.  PROCEDURE:  Hemorrhoidectomy.  SURGEON:  Timothy E. Earlene Plater, M.D.  ANESTHESIA:  General.  INDICATION:  Mr. Counsell is well-known to me, age 70, otherwise healthy, hypertension, treated, and has had hemorrhoids off and on for years. Recently, in the past six months, he has made every attempt at conservative management for pain, protrusion and bleeding without success.  Surgery was discussed and he is ready to proceed.  DESCRIPTION OF PROCEDURE:  Patient was brought to the operating room, placed supine, general LMA anesthesia provided.  He was placed in lithotomy position, perianal area inspected and prepped and draped in usual fashion.  Marcaine 0.5% with epinephrine mixed 9:1 with Wydase was injected around and about the anal orifice and massaged in well.  By far, the largest hemorrhoid was left lateral.  It was approached first by a suture ligature placed at its apex, careful incision around the hemorrhoidal complex and dissection from the underlying structures, notably the sphincters.  The hemorrhoidal mass was removed and the wound was closed with a running 2-0 chromic suture. Undermining of the edges removed the superficial varicosities and allowed for easy closure without tension.  The second largest hemorrhoid was the right anterior, which was removed in the same way through a smaller incision, and the right posterior hemorrhoid was internal only and in order to save circumference, I did a double-band ligation on the right posterior internal hemorrhoid.  An external tag posteriorly was removed and  closed with running 2-0 chromic.  There was a bit of bleeding from the apex of the left lateral, I suture-ligated that and it controlled it well. All wounds were checked, no evidence of trouble, Gelfoam and dry sterile dressing applied.  He tolerated it well and was removed to recovery room in good condition.  Written and verbal instructions including Percocet 5 mg, #40, were given to he and his wife and he will be followed as an outpatient. DD:  06/24/99 TD:  06/27/99 Job: [id number]

## 2010-05-30 NOTE — Cardiovascular Report (Signed)
NAMESTALIN, GRUENBERG                           ACCOUNT NO.:  [id number]   MEDICAL RECORD NO.:  [id number]                   PATIENT TYPE:  OIB   LOCATION:  2899                                 FACILITY:  [facility]   PHYSICIAN:  W. Ashley Royalty., M.D.         DATE OF BIRTH:  08-18-1940   DATE OF PROCEDURE:  09/04/2002  DATE OF DISCHARGE:                              CARDIAC CATHETERIZATION   HISTORY:  A 70 year old male with a positive family history of heart disease  who presented with prolonged episode of chest pain occurring at rest.  Previous efforts at noninvasive evaluation have not shown a cause for chest  pain.   PROCEDURE:  Left heart catheterization with coronary angiograms and left  ventriculogram.   COMMENTS ABOUT PROCEDURE:  The patient tolerated the procedure well without  complications.  The right femoral artery was entered using a single anterior  needle wall stick.  At the end of the procedure good hemostasis and pedal  pulses were noted.   HEMODYNAMIC DATA:  Aorta post contrast 129/77.  LV post contrast 129/9-13.   ANGIOGRAPHIC DATA:   LEFT VENTRICULOGRAM:  Performed in the 30 degree RAO projection.  The aortic  valve was normal.  The mitral valve was normal.  The left ventricle appears  normal in size.  The estimated ejection fraction is 60.   CORONARY ARTERIES:  1. Left dominant circulation with a small nondominant right coronary artery.     There is mild calcification involving the proximal LAD.  2. Left main coronary artery is normal.  3. Left anterior descending:  Large vessel that extends around the apex and     supplies the distal inferior wall.  There is a 40% proximal narrowing and     a 40% mid vessel narrowing noted.  4. Circumflex coronary artery:  Dominant vessel.  There are scattered     irregularities in the proximal vessel as well as the marginal branches.     The posterior descending artery has a segmental 70% stenosis prior to the  origin of two distal branches.  These are somewhat small and terminate in     the mid inferior wall.  The more lateral of these two branches has a     segmental 70-80% stenosis in its proximal portion.  The vessel appeared     to be around 1-1.5 mm vessel.  5. Right coronary artery:  Congenitally small vessel supplying mainly right     ventricular branches.   IMPRESSION:  1. Normal left ventricular function.  2. Coronary artery disease with mild to moderate disease involving the     proximal and mid left anterior descending,     significant disease involving the distal posterior descending artery off     the circumflex which appeared to be somewhat small and was not ideal for     percutaneous intervention.   RECOMMENDATIONS:  Continued medical therapy.  Intensive  lipid lowering.                                                Darden Palmer., M.D.    WST/MEDQ  D:  09/04/2002  T:  09/04/2002  Job:  [id number]   cc:   Titus Dubin. Alwyn Ren, M.D. [facility]

## 2010-05-30 NOTE — H&P (Signed)
[facility]  Patient:    David Cantrell, David Cantrell Visit Number: [id number] MRN: [id number]          Service Type: EMS Location: ED Attending Physician:  Ilene Qua Dictated by:   Sammuel Cooper, P.A.C. Admit Date:  02/17/2001   CC:         Vania Rea. Jarold Motto, M.D. [facility]  Titus Dubin. Alwyn Ren, M.D. [facility]   History and Physical  CHIEF COMPLAINT:  Rectal bleeding.  HISTORY OF PRESENT ILLNESS:  Mr. Manzo is a pleasant 70 year old white male, primary patient of Dr. Alwyn Ren, known to Dr. Sheryn Bison with history of multiple colon polyps.  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  He underwent follow-up colonoscopy on February 09, 2001, with the finding of a diminutive polyp in the sigmoid colon which was hot biopsied and one ascending colon polyp approximately 5 mm which was sessile and snared.  He was also noted to have large internal hemorrhoids.  The patient had done well until early this morning when he had a bowel movement and noticed a large amount of bright red blood.  She says he has not had any further episodes since that time.  He called the office and was advised to come to the ER.  He has no complaint of abdominal pain or cramping.  No nausea or vomiting.  No diaphoresis, shortness of breath, dizziness.  Vital signs are stable in the ER with blood pressure of 160/100, pulse of 75.  Laboratories in the ER show a WBC of 7.6, hemoglobin 14.6, hematocrit of 41.5, MCV of 87.4, platelets 214.  Protime 12.9, INR of 1, PTT of 30.  Electrolytes within normal limits.  LFTs within normal limits.  The patient is admitted at this time for an acute lower GI bleed, likely postpolypectomy etiology.  CURRENT MEDICATIONS:  Toprol XL 100 mg, 1/2 p.o. q.d.  No other regular medicines.  No aspirin or NSAIDs.  ALLERGIES:  SULFA, which causes hives.  CODEINE,  which produces constipation.  PAST MEDICAL HISTORY:  Pertinent for: 1. Hypertension. 2. Status post hemorrhoidectomy per Dr. Earlene Plater in June 2001. 3. History of multiple colon polyps. 4. Status post right shoulder repair x 2.  FAMILY HISTORY:  Pertinent for colon cancer in the patients father.  SOCIAL HISTORY:  The patient is married.  He is employed in refrigeration.  No tobacco and no ETOH.  REVIEW OF SYSTEMS:  CARDIOVASCULAR:  Negative for chest pain or anginal symptoms.  PULMONARY:  Negative for cough, shortness of breath, or sputum production.  GENITOURINARY:  Negative for dysuria, urgency, or frequency. MUSCULOSKELETAL:  Pertinent for aching rather diffusely over the past 24 hours.  GASTROINTESTINAL:  As outlined above.  PHYSICAL EXAMINATION:  GENERAL:  Well-developed white male in no acute distress.  He is alert and oriented x3.  SKIN:  Warm and dry.  VITAL SIGNS:  Blood pressure 160/100, pulse in the 70s, respirations 20, temperature 97.9.  HEENT:  Atraumatic, normocephalic.  EOMI.  PERRLA.  Sclerae anicteric.  NECK:  Supple, without nodes.  CARDIOVASCULAR:  Regular rate and rhythm with S1, S2.  LUNGS:  Clear to A&P.  ABDOMEN:  Soft and nontender.  There is no mass or hepatosplenomegaly.  Bowel sounds are active.  RECTAL:  Dark red blood on the examining glove.  EXTREMITIES:  Without clubbing, cyanosis, or edema.  IMPRESSION: 1. This is a 70 year old white male with  postpolypectomy hemorrhage, status    post polypectomy February 09, 2001. 2. History of multiple colon polyps and a large villous adenoma. 3. Positive family history of colon cancer. 4. Hypertension.  PLAN:  The patient is admitted to the service of Dr. Claudette Head, who is covering the hospital.  He will be kept at bowel rest, hydration, bed rest. Serial H&Hs.  Transfuse as needed for hemoglobin in the 8-9 range.  Will repeat colonoscopy with goal of control of bleeding if he continues to  bleed beyond 24 hours or becomes unstable.  Otherwise, will manage expectantly. Dictated by:   Sammuel Cooper, P.A.C. Attending Physician:  Ilene Qua DD:  02/17/01 TD:  02/17/01 Job: [id number]

## 2010-05-30 NOTE — Discharge Summary (Signed)
[facility]  Patient:    David Cantrell, David Cantrell Visit Number: [id number] MRN: [id number]          Service Type: MED Location: 3W 0345 01 Attending Physician:  Starr Sinclair Dictated by:   Sammuel Cooper, P.A. Admit Date:  02/17/2001 Discharge Date: 02/18/2001   CC:         David Cantrell, M.D. [facility]   Discharge Summary  ADMITTING DIAGNOSES: 18. A 70 year old male with post polypectomy hemorrhage status post colonoscopy    with polypectomy February 09, 2001. 2. History of multiple colon polyps and large villous adenoma. 3. Positive family history of colon cancer. 4. Hypertension.  DISCHARGE DIAGNOSES: 1. Stable status post resolved post polypectomy hemorrhage. 2. History of multiple colon polyps and large villous adenoma. 3. Positive family history of colon cancer. 4. Hypertension.  CONSULTATIONS:  None.  PROCEDURES:  None.  BRIEF HISTORY:  David Cantrell is a pleasant 70 year old white male primary patient of Dr. Alwyn Cantrell known to Dr. Sheryn Bison with history of multiple colon polyps.  The patient had undergone prior colonoscopy one year ago with removal of a large villous adenoma which had high grade dysplasia in the sigmoid colon.  He then underwent follow-up colonoscopy on February 09, 2001.  Was found to have a diminutive polyp in the sigmoid colon which was hot biopsied and one ascending colon polyp 5 mm which was sessile and snared.  He was also noted to have large internal hemorrhoids.  The patient had done well until early on the morning of admission when he had a bowel movement and noticed a large amount of bright red blood.  He says he has not noted any further episodes of bleeding since that time.  He called the office and was advised to come to the emergency room for evaluation.  He has had no associated complaints of shortness of breath, dizziness, lightheadedness, etcetera. Vital signs were stable in the emergency room with a  blood pressure 160/100, pulse 75.  Laboratories in the ER showed WBC 7.6, hemoglobin 14.6, hematocrit 41.5, protime 12.9, INR 1.  The patient is admitted to the hospital with an acute lower GI bleed likely post polypectomy in origin.  LABORATORIES:  February 17, 2001:  WBC 7.6, hemoglobin 14.6, hematocrit 41.5, MCV 87.4, platelets 214,000.  Serial H&H were obtained.  Later on February 6 hemoglobin was 14, hematocrit 39.3.  On the morning of February 7 hemoglobin was 13.8, hematocrit 39.5, protime 12.8, INR 1, PTT 30.  Electrolytes within normal limits.  BUN 16, creatinine 1.6, glucose 97, albumin 4.1.  Liver function studies normal.  Blood type A+.  HOSPITAL COURSE:  The patient was admitted to the service of Dr. Claudette Head who was covering the hospital.  He was initially kept at bed rest, placed on liquid diet, started on IV fluids, and had serial H&H.  The patient had a stable and benign hospital course.  He did not manifest any further active bleeding after admission.  The following morning his vital signs remained quite stable.  His hemoglobin had drifted a bit to 13.8 but he had not had any further bowel movements consistent with resolution of his bleed.  His diet was advanced and he was allowed discharge to home with instructions to limit his activity over the next few days, to go home and take it easy for two to three days.  He was advised to stay out of work next week due to necessity of heavy lifting at  his job.  He was advised no aspirin or anti-inflammatories and to use Tylenol if needed for pain.  He was to resume his Toprol XL as previous. He was discharged to home on a full liquid diet for 48 hours and then advance to low residue for one week.  He was to follow up with Dr. Jarold Motto on February 14 at 11:30 a.m. and to call for problems in the interim or evidence of rebleeding.  CONDITION ON DISCHARGE:  Stable. Dictated by:   Sammuel Cooper, P.A. Attending Physician:   Starr Sinclair DD:  02/28/01 TD:  02/28/01 Job: [id number]

## 2010-06-02 ENCOUNTER — Other Ambulatory Visit: Payer: Self-pay | Admitting: Cardiology

## 2010-06-02 ENCOUNTER — Ambulatory Visit
Admission: RE | Admit: 2010-06-02 | Discharge: 2010-06-02 | Disposition: A | Discharge status: Home / Self Care | Payer: Medicare Other | Source: Ambulatory Visit | Attending: Cardiology | Admitting: Cardiology

## 2010-06-02 DIAGNOSIS — I2510 Atherosclerotic heart disease of native coronary artery without angina pectoris: Secondary | ICD-10-CM

## 2010-06-05 ENCOUNTER — Inpatient Hospital Stay (HOSPITAL_BASED_OUTPATIENT_CLINIC_OR_DEPARTMENT_OTHER)
Admission: RE | Admit: 2010-06-05 | Discharge: 2010-06-05 | Disposition: A | Discharge status: Home / Self Care | Payer: Medicare Other | Source: Ambulatory Visit | Attending: Cardiology | Admitting: Cardiology

## 2010-06-05 DIAGNOSIS — R9439 Abnormal result of other cardiovascular function study: Secondary | ICD-10-CM | POA: Insufficient documentation

## 2010-06-05 DIAGNOSIS — E785 Hyperlipidemia, unspecified: Secondary | ICD-10-CM | POA: Insufficient documentation

## 2010-06-05 DIAGNOSIS — I10 Essential (primary) hypertension: Secondary | ICD-10-CM | POA: Insufficient documentation

## 2010-06-05 DIAGNOSIS — R0609 Other forms of dyspnea: Secondary | ICD-10-CM | POA: Insufficient documentation

## 2010-06-05 DIAGNOSIS — R0989 Other specified symptoms and signs involving the circulatory and respiratory systems: Secondary | ICD-10-CM | POA: Insufficient documentation

## 2010-06-05 DIAGNOSIS — I2510 Atherosclerotic heart disease of native coronary artery without angina pectoris: Secondary | ICD-10-CM | POA: Insufficient documentation

## 2010-06-06 ENCOUNTER — Other Ambulatory Visit: Payer: Self-pay | Admitting: *Deleted

## 2010-06-06 DIAGNOSIS — E785 Hyperlipidemia, unspecified: Secondary | ICD-10-CM

## 2010-06-06 MED ORDER — FENOFIBRATE MICRONIZED 200 MG PO CAPS: 200.0000 mg | ORAL_CAPSULE | Freq: Every day | ORAL | Status: DC

## 2010-07-04 ENCOUNTER — Other Ambulatory Visit: Payer: Self-pay | Admitting: *Deleted

## 2010-07-04 DIAGNOSIS — E785 Hyperlipidemia, unspecified: Secondary | ICD-10-CM

## 2010-07-04 MED ORDER — FENOFIBRATE MICRONIZED 200 MG PO CAPS: 200.0000 mg | ORAL_CAPSULE | Freq: Every day | ORAL | Status: DC

## 2010-07-15 NOTE — Cardiovascular Report (Signed)
  David Cantrell, David Cantrell                 ACCOUNT NO.:  [id number]  MEDICAL RECORD NO.:  [id number]            PATIENT TYPE:  LOCATION:                                 FACILITY:  PHYSICIAN:  Georga Hacking, M.D.DATE OF BIRTH:  30-Apr-1940  DATE OF PROCEDURE:  06/05/2010                           CARDIAC CATHETERIZATION   HISTORY:  This is a 69 year old male with a history of coronary artery disease, hypertension, and hyperlipidemia who had worsening dyspnea and has worsening inferolateral ischemia on Cardiolite study.  PROCEDURE:  Left heart catheterization with coronary angiograms and left ventriculogram.  DESCRIPTION OF PROCEDURE:  [facility] and prepped and draped in the usual manner.  After Xylocaine anesthesia, a 4-French sheath was placed in the right femoral percutaneously using a single anterior needle wall stick.  Angiograms were made using 4-French catheters and a 30-mL ventriculogram was performed.  Following the procedure, he was taken to the holding area for further sheath removal.  He tolerated the procedure well.  HEMODYNAMIC DATA:  Aorta postcontrast 113/64.  LV postcontrast 113/6-11.  ANGIOGRAPHIC DATA:  Left ventriculogram:  Performed in the 30-degree RAO projection.  The aortic valve is normal.  The mitral valve is normal. There is mild coronary calcification noted.  Left main coronary artery has a mild 10-20% distal stenosis.  Left anterior descending has mild irregularity with 20-30% stenosis noted.  A large intermediate branch arises and contained scattered irregularities.  The circumflex is a dominant vessel with scattered irregularities.  There is a severe 80% stenosis in the distal posterior descending artery proximally.  This was then a second branch of the PDA.  There is no significant proximal disease noted.  The right coronary artery is a nondominant vessel supplying predominantly right ventricular  branches.  IMPRESSION: 1. Severe coronary artery disease involving the posterior descending     artery of a dominant circumflex. 2. Very mild left main and mild left anterior descending disease. 3. Normal left ventricular function.  RECOMMENDATIONS:  Continued medical therapy.    Georga Hacking, M.D.    WST/MEDQ  D:  06/05/2010  T:  06/06/2010  Job:  [id number]  cc:   Titus Dubin. Alwyn Ren, MD,FACP,FCCP  Electronically Signed by Lacretia Nicks. Donnie Aho M.D. on 07/15/2010 05:00:52 PM

## 2010-07-28 ENCOUNTER — Other Ambulatory Visit: Payer: Self-pay | Admitting: Internal Medicine

## 2010-07-28 NOTE — Telephone Encounter (Signed)
Patient needs to schedule CPX with fasting labs  

## 2010-10-01 ENCOUNTER — Other Ambulatory Visit: Payer: Self-pay | Admitting: Internal Medicine

## 2010-10-30 ENCOUNTER — Other Ambulatory Visit: Payer: Self-pay | Admitting: Internal Medicine

## 2010-11-21 ENCOUNTER — Encounter: Payer: Self-pay | Admitting: Internal Medicine

## 2010-11-21 ENCOUNTER — Ambulatory Visit (INDEPENDENT_AMBULATORY_CARE_PROVIDER_SITE_OTHER): Payer: Medicare Other | Admitting: Internal Medicine

## 2010-11-21 VITALS — BP 124/88 | HR 72 | Temp 98.5°F | Wt 233.8 lb

## 2010-11-21 DIAGNOSIS — J019 Acute sinusitis, unspecified: Secondary | ICD-10-CM

## 2010-11-21 MED ORDER — AMOXICILLIN 500 MG PO CAPS: 500.0000 mg | ORAL_CAPSULE | Freq: Three times a day (TID) | ORAL | Status: AC

## 2010-11-21 NOTE — Progress Notes (Signed)
  Subjective:    Patient ID: David Cantrell, male    DOB: [birth year], 70 y.o.   MRN: [id number]  HPI Respiratory tract infection Onset/symptoms:10/18 as ST Exposures (illness/environmental/extrinsic):no Progression of symptoms:to headcongestion Treatments/response:Mucinex , Allegra, NSAIDS/ ? Some benefit Present symptoms: Fever/chills/sweats:intermittent fever to ? 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 Some benefit Smoking history:quit 1972           Review of Systems he describes cold-induced wheezing     Objective:   Physical Exam General appearance is of good health and nourishment; no acute distress or increased work of breathing is present.  No  lymphadenopathy about the head, neck, or axilla noted.   Eyes: No conjunctival inflammation or lid edema is present.   Ears:  External ear exam shows no significant lesions or deformities.  Otoscopic examination reveals clear canals, tympanic membranes are intact bilaterally without bulging, retraction, inflammation or discharge.  Nose:  External nasal examination shows no deformity or inflammation. Nasal mucosa are pink and moist without lesions or exudates. Minor  septal dislocation .No obstruction to airflow.   Oral exam: upper plate & lower partial; lips and gums are healthy appearing.There is no oropharyngeal erythema or exudate noted.      Heart:  Normal rate and regular rhythm. S1 and S2 normal without gallop, murmur, click, rub.S4 with minimal slurring intermittently Lungs:Chest clear to auscultation; no wheezes, rhonchi,rales ,or rubs present.No increased work of breathing.    Extremities:  No cyanosis,  edema, or clubbing  noted    Skin: Warm & dry .          Assessment & Plan:  #1 rhinosinusitis  Plan: See orders and recommendations

## 2010-11-21 NOTE — Patient Instructions (Signed)
Plain Mucinex for thick secretions ;force  fluids for next 48 hrs. Use a Neti pot daily as needed for sinus congestion

## 2010-12-01 ENCOUNTER — Other Ambulatory Visit: Payer: Self-pay | Admitting: Internal Medicine

## 2010-12-01 MED ORDER — CEFUROXIME AXETIL 500 MG PO TABS: 500.0000 mg | ORAL_TABLET | Freq: Two times a day (BID) | ORAL | Status: AC

## 2010-12-01 NOTE — Telephone Encounter (Signed)
Pt aware.

## 2010-12-01 NOTE — Telephone Encounter (Signed)
Dr.Hopper please advise on refill request for ABX 

## 2010-12-01 NOTE — Telephone Encounter (Signed)
Change to cefuroxime 500 mg twice a day dispense 14. The bacteria may be resistant to amoxicillin.

## 2010-12-01 NOTE — Telephone Encounter (Signed)
Pt seen on 11-21-10 for sinusitis, Pt still c/o cough up greenish mucous, drainage and head congestion,low grade fever,chills and sweats.. Pt denies sore throat, body aches

## 2010-12-03 ENCOUNTER — Other Ambulatory Visit: Payer: Self-pay | Admitting: Internal Medicine

## 2011-01-26 ENCOUNTER — Other Ambulatory Visit: Payer: Self-pay | Admitting: Gastroenterology

## 2011-01-26 ENCOUNTER — Other Ambulatory Visit: Payer: Self-pay | Admitting: Internal Medicine

## 2011-01-26 NOTE — Telephone Encounter (Signed)
Can not find an office visit in any system as far back as 2001. I do see a colonoscopy in 2008. Advised pt that he needs ov if he needs a rx. He wil lcall back if he decides he wants rx. He will call Dr Alwyn Ren to see if he can refills.

## 2011-02-16 ENCOUNTER — Ambulatory Visit (INDEPENDENT_AMBULATORY_CARE_PROVIDER_SITE_OTHER): Payer: Medicare Other | Admitting: Internal Medicine

## 2011-02-16 ENCOUNTER — Encounter: Payer: Self-pay | Admitting: Internal Medicine

## 2011-02-16 ENCOUNTER — Ambulatory Visit (INDEPENDENT_AMBULATORY_CARE_PROVIDER_SITE_OTHER)
Admission: RE | Admit: 2011-02-16 | Discharge: 2011-02-16 | Disposition: A | Discharge status: Home / Self Care | Payer: Medicare Other | Source: Ambulatory Visit | Attending: Internal Medicine | Admitting: Internal Medicine

## 2011-02-16 VITALS — BP 122/82 | HR 65 | Temp 98.5°F | Ht 73.5 in | Wt 238.0 lb

## 2011-02-16 DIAGNOSIS — J45909 Unspecified asthma, uncomplicated: Secondary | ICD-10-CM

## 2011-02-16 DIAGNOSIS — H109 Unspecified conjunctivitis: Secondary | ICD-10-CM

## 2011-02-16 DIAGNOSIS — H1010 Acute atopic conjunctivitis, unspecified eye: Secondary | ICD-10-CM

## 2011-02-16 MED ORDER — FLUTICASONE-SALMETEROL 250-50 MCG/DOSE IN AEPB: 1.0000 | INHALATION_SPRAY | Freq: Two times a day (BID) | RESPIRATORY_TRACT | Status: DC

## 2011-02-16 MED ORDER — MONTELUKAST SODIUM 10 MG PO TABS: 10.0000 mg | ORAL_TABLET | Freq: Every day | ORAL | Status: DC

## 2011-02-16 MED ORDER — ALBUTEROL SULFATE HFA 108 (90 BASE) MCG/ACT IN AERS: 2.0000 | INHALATION_SPRAY | RESPIRATORY_TRACT | Status: DC | PRN

## 2011-02-16 NOTE — Progress Notes (Signed)
  Subjective:    Patient ID: David Cantrell, male    DOB: 03/25/1940, 71 y.o.   MRN: [id number]  HPI SHORTNESS OF BREATH Onset: 2 weeks as EIB Context/character:now even @ rest @ times Severity/limitation:now unable to do yard work; winded taking garbage can to street Modifying factors:extreme temp changes exacerbate symptoms Associated signs/symptoms: variable  Wheezing; minor extrinsic symptoms which improve with Allegra(see below) Treatment/Response: old MDI did not help   Review of systems: Constitutional: No fever, chills, significant weight change, sleep dysfunction (no severe snoring or apnea), fatigue, night sweats Cardiovascular: No exertional chest pain, palpitations, racing, irregularity, syncope, diaphoresis, claudication Respiratory: Slight intermittent  Cough; no sputum production; no hemoptysis; no  Pleurisy; no hoarseness. Dr David Cantrell , Cardiologist recommended weight loss when seen 11/12 GI: No reflux, dysphagia, melena, rectal bleeding Endocrine: No skin/hair/nail changes Neuro/Muscular: No weakness, tremor, gait dysfunction Heme/lymph: No abnormal bruising, lymphadenopathy Allergy/immunologic: Some rhinoconjunctivitis; no  angioedema Past medical history: CAD; PMH of EIB w/o asthma Smoking history:quit 1972 Family history:no pulmonary disease; Father had CVAs & CAD.        Review of Systems He has not been exercising on regular basis. He denies paroxysmal nocturnal dyspnea or pedal edema.  He has been off allergy shots 4& a half years. 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  No  lymphadenopathy about the head, neck, or axilla noted.   Eyes: No conjunctival inflammation or lid edema is present.   Ears:  External ear exam shows no significant lesions or deformities.  Otoscopic examination reveals clear canals,  tympanic membranes are intact bilaterally without bulging, retraction, inflammation or discharge.  Nose:  External nasal examination shows no deformity or inflammation. Nasal mucosa are pink and moist without lesions or exudates. No septal dislocation or deviation.No obstruction to airflow.   Oral exam: Upper plate , lower partial; lips and gums are healthy appearing.There is no oropharyngeal erythema or exudate noted.   Neck:  No deformities, thyromegaly, masses, or tenderness noted. No neck vein distention is present @ 15   Heart:  Normal rate and regular rhythm. S1 and S2 normal without gallop, murmur, click, rub or other extra sounds.   Lungs:Chest clear to auscultation; no wheezes, rhonchi,rales ,or rubs present.No increased work of breathing. Intermittently some upper airway wheezing was noted.  Abdomen: He has no organomegaly, masses or tenderness. There is no hepatojugular reflux    Extremities:  No cyanosis, edema, or clubbing  noted . Homans sign is negative  Vasc: slightly decreased DPP; all others normal w/o bruits   Skin: Warm & dry w/o jaundice or tenting.          Assessment & Plan:    #1 exertional dyspnea and wheezing in the context of a past history of exercise-induced bronchospasm  #2 some extrinsic symptomatology  #3 coronary disease, clinically stable. Cardiac decompensation is not suggested  Plan: See orders and recommendations. Because of the extrinsic component singular will be added in addition to as needed bronchodilator and long-acting agent.  Pulmonary function testing would be indicated if symptoms don't respond to medications. Chest x-ray will be completed as well

## 2011-02-16 NOTE — Patient Instructions (Signed)
Order for x-rays entered into  the computer; these will be performed at [address] across from [facility]. No appointment is necessary. 

## 2011-02-17 ENCOUNTER — Other Ambulatory Visit: Payer: Self-pay

## 2011-02-17 NOTE — Telephone Encounter (Signed)
Pt called to advise that his insurance denied his medication for Ventolin and he wanted to make sure MD Alwyn Ren was aware of the change, spoke with MD Alwyn Ren CMA and was advised that MD Alwyn Ren has received a letter indicating that he no longer can receive this medication per insurnace, awaiting review from MD Hopper to advise a different medication for pt to replace the ventolin, called pt to advise the change is being processed. Pt understood

## 2011-02-17 NOTE — Telephone Encounter (Signed)
I called [phone] to begin Prior Auth process, I was told form will be faxed I gave back fax number [phone]

## 2011-02-17 NOTE — Telephone Encounter (Addendum)
Form received, signed by MD and Faxed back to [phone] along with last OV dated 02/16/2011

## 2011-03-03 ENCOUNTER — Other Ambulatory Visit: Payer: Self-pay | Admitting: Internal Medicine

## 2011-03-03 ENCOUNTER — Telehealth: Payer: Self-pay | Admitting: *Deleted

## 2011-03-03 MED ORDER — ALBUTEROL SULFATE HFA 108 (90 BASE) MCG/ACT IN AERS: 2.0000 | INHALATION_SPRAY | Freq: Four times a day (QID) | RESPIRATORY_TRACT | Status: DC | PRN

## 2011-03-03 NOTE — Telephone Encounter (Signed)
Per Dr Alwyn Ren ok to change to proair or whichever is preferred by insurance. Spoke to [employer] is the preferred med. New Rx sent in to pharmacy.

## 2011-04-01 ENCOUNTER — Other Ambulatory Visit: Payer: Self-pay | Admitting: Internal Medicine

## 2011-04-06 ENCOUNTER — Encounter: Payer: Self-pay | Admitting: Gastroenterology

## 2011-04-09 ENCOUNTER — Telehealth: Payer: Self-pay | Admitting: Internal Medicine

## 2011-04-09 ENCOUNTER — Emergency Department: Payer: Self-pay | Admitting: Emergency Medicine

## 2011-04-09 NOTE — Telephone Encounter (Signed)
Caller: David Cantrell/Patient; PCP: Marga Melnick; CB#: [phone]; ; ; Call regarding Injury/Trauma; states was fishing, and his fishing hook blew into his neck; his brother cut the line but the hook is stuck in the neck.  States is in [place] at [facility].  No bleeding currently.  Hook is stuck in the R side of the neck.  Occurred 1545 04/09/11.  Advised 911 due to #8 size hook in the side of the neck.  Declines; states is in the car and is almost at [facility] ED.  States is faster to go there now in the car than to call 911.  Again advised 911; patient states will go to ED via car.

## 2011-06-09 ENCOUNTER — Other Ambulatory Visit: Payer: Self-pay | Admitting: Internal Medicine

## 2011-06-10 NOTE — Telephone Encounter (Signed)
Patient needs to schedule fasting labs Lipid 272.4

## 2011-06-18 ENCOUNTER — Encounter: Payer: Self-pay | Admitting: Family Medicine

## 2011-06-18 ENCOUNTER — Ambulatory Visit (INDEPENDENT_AMBULATORY_CARE_PROVIDER_SITE_OTHER): Payer: Medicare Other | Admitting: Family Medicine

## 2011-06-18 VITALS — BP 142/84 | HR 64 | Temp 98.4°F | Wt 233.8 lb

## 2011-06-18 DIAGNOSIS — M549 Dorsalgia, unspecified: Secondary | ICD-10-CM

## 2011-06-18 DIAGNOSIS — J45990 Exercise induced bronchospasm: Secondary | ICD-10-CM

## 2011-06-18 DIAGNOSIS — E785 Hyperlipidemia, unspecified: Secondary | ICD-10-CM

## 2011-06-18 MED ORDER — HYDROCODONE-ACETAMINOPHEN 5-500 MG PO TABS: 1.0000 | ORAL_TABLET | Freq: Three times a day (TID) | ORAL | Status: AC | PRN

## 2011-06-18 MED ORDER — CYCLOBENZAPRINE HCL 10 MG PO TABS: 10.0000 mg | ORAL_TABLET | Freq: Three times a day (TID) | ORAL | Status: AC | PRN

## 2011-06-18 MED ORDER — ALBUTEROL SULFATE HFA 108 (90 BASE) MCG/ACT IN AERS: 2.0000 | INHALATION_SPRAY | Freq: Four times a day (QID) | RESPIRATORY_TRACT | Status: DC | PRN

## 2011-06-18 NOTE — Progress Notes (Signed)
  Subjective:    David Cantrell is a 71 y.o. male who presents for evaluation of low back pain. The patient has had no prior back problems. Symptoms have been present for 7 week and are gradually worsening.  Onset was related to / precipitated by no known injury. The pain is located in the interscapular area and does not radiate. The pain is described as burning and sharp and occurs all day. He rates his pain as severe. Symptoms are exacerbated by coughing, deep breathing and lying down. Symptoms are improved by heat and ice. He has also tried nothing which provided no symptom relief. He has no other symptoms associated with the back pain. The patient has no "red flag" history indicative of complicated back pain.  The following portions of the patient's history were reviewed and updated as appropriate: allergies, current medications, past family history, past medical history, past social history, past surgical history and problem list.  Review of Systems Pertinent items are noted in HPI.    Objective:   Normal reflexes, gait, strength and negative straight-leg raise. Inspection and palpation: paraspinal tenderness noted T4-7 on right, antalgic gait. Muscle tone and ROM exam: muscle spasm noted t4-7.    Assessment:    thoracic muscle spasm  Plan:    Natural history and expected course discussed. Questions answered. Short (2-4 day) period of relative rest recommended until acute symptoms improve. Heat to affected area as needed for local pain relief. Muscle relaxants per medication orders.  rto if symptoms do not improve

## 2011-06-18 NOTE — Patient Instructions (Signed)
Back Pain, Adult Low back pain is very common. About 1 in 5 people have back pain.The cause of low back pain is rarely dangerous. The pain often gets better over time.About half of people with a sudden onset of back pain feel better in just 2 weeks. About 8 in 10 people feel better by 6 weeks.  CAUSES Some common causes of back pain include:  Strain of the muscles or ligaments supporting the spine.   Wear and tear (degeneration) of the spinal discs.   Arthritis.   Direct injury to the back.  DIAGNOSIS Most of the time, the direct cause of low back pain is not known.However, back pain can be treated effectively even when the exact cause of the pain is unknown.Answering your caregiver's questions about your overall health and symptoms is one of the most accurate ways to make sure the cause of your pain is not dangerous. If your caregiver needs more information, he or she may order lab work or imaging tests (X-rays or MRIs).However, even if imaging tests show changes in your back, this usually does not require surgery. HOME CARE INSTRUCTIONS For many people, back pain returns.Since low back pain is rarely dangerous, it is often a condition that people can learn to manageon their own.   Remain active. It is stressful on the back to sit or stand in one place. Do not sit, drive, or stand in one place for more than 30 minutes at a time. Take short walks on level surfaces as soon as pain allows.Try to increase the length of time you walk each day.   Do not stay in bed.Resting more than 1 or 2 days can delay your recovery.   Do not avoid exercise or work.Your body is made to move.It is not dangerous to be active, even though your back may hurt.Your back will likely heal faster if you return to being active before your pain is gone.   Pay attention to your body when you bend and lift. Many people have less discomfortwhen lifting if they bend their knees, keep the load close to their  bodies,and avoid twisting. Often, the most comfortable positions are those that put less stress on your recovering back.   Find a comfortable position to sleep. Use a firm mattress and lie on your side with your knees slightly bent. If you lie on your back, put a pillow under your knees.   Only take over-the-counter or prescription medicines as directed by your caregiver. Over-the-counter medicines to reduce pain and inflammation are often the most helpful.Your caregiver may prescribe muscle relaxant drugs.These medicines help dull your pain so you can more quickly return to your normal activities and healthy exercise.   Put ice on the injured area.   Put ice in a plastic bag.   Place a towel between your skin and the bag.   Leave the ice on for 15 to 20 minutes, 3 to 4 times a day for the first 2 to 3 days. After that, ice and heat may be alternated to reduce pain and spasms.   Ask your caregiver about trying back exercises and gentle massage. This may be of some benefit.   Avoid feeling anxious or stressed.Stress increases muscle tension and can worsen back pain.It is important to recognize when you are anxious or stressed and learn ways to manage it.Exercise is a great option.  SEEK MEDICAL CARE IF:  You have pain that is not relieved with rest or medicine.   You have   pain that does not improve in 1 week.   You have new symptoms.   You are generally not feeling well.  SEEK IMMEDIATE MEDICAL CARE IF:   You have pain that radiates from your back into your legs.   You develop new bowel or bladder control problems.   You have unusual weakness or numbness in your arms or legs.   You develop nausea or vomiting.   You develop abdominal pain.   You feel faint.  Document Released: 12/29/2004 Document Revised: 12/18/2010 Document Reviewed: 05/19/2010 Exit[employer] Patient Information 2012 [employer]. 

## 2011-06-19 LAB — HEPATIC FUNCTION PANEL
Albumin: 4.3 g/dL (ref 3.5–5.2)
Bilirubin, Direct: 0.1 mg/dL (ref 0.0–0.3)
Total Protein: 7.5 g/dL (ref 6.0–8.3)

## 2011-06-26 ENCOUNTER — Encounter: Payer: Self-pay | Admitting: Cardiology

## 2011-07-11 ENCOUNTER — Other Ambulatory Visit: Payer: Self-pay | Admitting: Internal Medicine

## 2011-07-15 ENCOUNTER — Telehealth: Payer: Self-pay | Admitting: *Deleted

## 2011-07-15 NOTE — Telephone Encounter (Signed)
Pt states that since OV he has seen a chiropractor for the back pain which seem to get better for a while but has return. Pt would like to know if it would be possible for him to get a MRI done to see what is going on with his back. .Please advise

## 2011-07-15 NOTE — Telephone Encounter (Signed)
Radiology  & insurance organizations  require an updated, current  assessment and written note from the Primary Care physician  to review before they will schedule an MRI to assess symptoms or problems. If we do not have such  a current  assessment of your health issue or complaint in the chart (electronic medical record);you will need to  make an appointment to create this document THEY REQUIRE. It will be necessary to know prior evaluations and treatments of this symptom and response to these interventions. Please bring that medical history & all medications & supplements to that appointment so I can complete the required document.

## 2011-07-15 NOTE — Telephone Encounter (Signed)
Discuss with patient, Appt scheduled. 

## 2011-07-17 ENCOUNTER — Ambulatory Visit (INDEPENDENT_AMBULATORY_CARE_PROVIDER_SITE_OTHER): Payer: Medicare Other | Admitting: Internal Medicine

## 2011-07-17 ENCOUNTER — Encounter: Payer: Self-pay | Admitting: Internal Medicine

## 2011-07-17 ENCOUNTER — Ambulatory Visit (INDEPENDENT_AMBULATORY_CARE_PROVIDER_SITE_OTHER)
Admission: RE | Admit: 2011-07-17 | Discharge: 2011-07-17 | Disposition: A | Discharge status: Home / Self Care | Payer: Medicare Other | Source: Ambulatory Visit | Attending: Internal Medicine | Admitting: Internal Medicine

## 2011-07-17 VITALS — BP 126/82 | HR 83

## 2011-07-17 DIAGNOSIS — M545 Low back pain: Secondary | ICD-10-CM

## 2011-07-17 MED ORDER — CYCLOBENZAPRINE HCL 5 MG PO TABS: ORAL_TABLET | ORAL | Status: DC

## 2011-07-17 MED ORDER — HYDROCODONE-ACETAMINOPHEN 7.5-500 MG PO TABS: 1.0000 | ORAL_TABLET | ORAL | Status: AC | PRN

## 2011-07-17 NOTE — Patient Instructions (Addendum)
The best exercises for the low back include freestyle swimming, stretch aerobics, and yoga. Order for x-rays entered into  the computer; these will be performed at [address] [facility] across from [facility]. No appointment is necessary. Please try to go on My Chart within the next 24 hours to allow me to release the results directly to you.

## 2011-07-17 NOTE — Progress Notes (Signed)
Subjective:    Patient ID: David Cantrell, male    DOB: Jul 14, 1940, 71 y.o.   MRN: [id number]  HPI He describes constant dull right lumbosacral area pain for over a month. There was no  trigger or trauma prior to the onset of pain. He was seen and muscle relaxants and pain medications (generic Flexeril & Vicodin) prescribed previously; these were only partially of benefit. The pain will radiate to the left lumbosacral area and also to the right hip. It does seem to be better temporarily with rest such as reclining or with sitting or leaning forward.  He is seeing the chiropractor for manipulation of the spine; he's had some benefit with this on 2 occasions. 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 He has had open procedures on the right shoulder x2 and arthroscopy on the left shoulder x1. There is no other history of chronic steroid use or osteoporosis. Past medical history/family history/social history were all reviewed and updated.        Review of Systems Fecal/urinary incontinence: no Numbness/Weakness: no  Fever/chills/sweats: no  Night pain: some in early am Unexplained weight loss:no  h/o cancer/immunosuppression: no  He denies abdominal pain, melena, or rectal bleeding. There's been no associated dysuria, pyuria, or hematuria.     Objective:   Physical Exam Gen.:  well-nourished in appearance. Alert, appropriate and cooperative throughout exam. Neck: No deformities, masses, or tenderness noted. Range of motion normal Lungs: Normal respiratory effort; chest expands symmetrically. Lungs are clear to auscultation without rales, wheezes, or increased work of breathing. Heart: Normal rate and rhythm. Normal S1 and S2. No gallop, click, or rub. S4 w/o murmur. Abdomen: Bowel sounds normal; abdomen soft and nontender. No masses, organomegaly or hernias noted.No  AAA                                                                              Musculoskeletal/extremities: No deformity or scoliosis noted of  the thoracic or lumbar spine; but there is some asymmetry of the lower posterior thoracic musculature suggesting occult scoliosis. . No clubbing, cyanosis, edema, or deformity noted. Range of motion  normal .Tone & strength  normal.Joints normal. Nail health  good. He is able to complete heel and toe walking but has pain in the right LS area with these maneuvers. He is able to lie back and sit up without help but does so slowly with some obvious discomfort. Straight leg raising is negative bilaterally. Vascular: Carotid, radial artery, dorsalis pedis and  posterior tibial pulses are full and equal. No bruits present. Neurologic: Alert and oriented x3. Deep tendon reflexes symmetrical and normal.         Skin: Intact without suspicious lesions or rashes. He has multiple keratoses of the posterior spine Lymph: No cervical, axillary lymphadenopathy present. Psych: Mood and affect are normal. Normally interactive  Assessment & Plan:  #1 low back syndrome without neuromuscular deficit. Ruptured disc is not present clinically. Plain film should be performed. Medications will be renewed; referral to physical therapy is anticipated if  films reveal no acute change

## 2011-07-20 ENCOUNTER — Telehealth: Payer: Self-pay | Admitting: Internal Medicine

## 2011-07-20 ENCOUNTER — Other Ambulatory Visit: Payer: Self-pay | Admitting: Internal Medicine

## 2011-07-20 DIAGNOSIS — M545 Low back pain: Secondary | ICD-10-CM

## 2011-07-20 NOTE — Telephone Encounter (Signed)
See My Chart notes on report copy

## 2011-07-20 NOTE — Telephone Encounter (Signed)
Patient wife called and has questions regarding the visit on Friday as far as moving forward after getting xray results Please all patient at [phone], cell [phone]

## 2011-07-20 NOTE — Telephone Encounter (Signed)
Entered by Pecola Lawless, MD at 07/17/2011 2:57 PM Read by Raquel Sarna Mazer at 07/18/2011 7:17 AM There is no significant finding on the plain films. If symptoms persist or progress despite the stretching exercises and the pain medication; I recommend referral to Dr. Ethelene Hal. He has physical therapists in his practice. Thank you for using My Chart; it has served [place] well. Hopp   Pt informed; per patient, please place referral in EMR to begin process for OV w/Dr. Ramos/SLS

## 2011-07-31 ENCOUNTER — Other Ambulatory Visit: Payer: Self-pay | Admitting: Internal Medicine

## 2011-08-03 ENCOUNTER — Telehealth: Payer: Self-pay | Admitting: Internal Medicine

## 2011-08-03 NOTE — Telephone Encounter (Signed)
Caller: David Cantrell/Patient; PCP: Marga Melnick; CB#: [phone];  Call regarding Back Pain - lower back past few months/ joints hurt for past week. Xrays done 07/17/11 normal. Feel achy all over. Temp = 98.9 oral. Started with R shoulder muscle tightness and then pain in lower back. Referred to Ortho MD and took Prednisone, Hydrocodone and Muscle Relaxor and sx better - Prednisone stopped on 07/28/11 and now hurts just as before. PT on 07/29/11. Cancelled appnt on 08/05/11 since not feeling well. Coughed up some greenish mucos this morning -08/03/11. Headhaches for past week and facial pressure- too 2 Alleve this morning/ Took Ibuprofen 600mg s at 1300 and helped with headache. Triage and Care advice per Back Sx and Headache Protocol and appnt advised within 24 hours. Scheduled for  1115 08/04/11 with Dr. Laury Axon.

## 2011-08-04 ENCOUNTER — Encounter: Payer: Self-pay | Admitting: Family Medicine

## 2011-08-04 ENCOUNTER — Ambulatory Visit (INDEPENDENT_AMBULATORY_CARE_PROVIDER_SITE_OTHER): Payer: Medicare Other | Admitting: Family Medicine

## 2011-08-04 VITALS — BP 124/76 | HR 68 | Temp 98.4°F | Wt 228.2 lb

## 2011-08-04 DIAGNOSIS — J329 Chronic sinusitis, unspecified: Secondary | ICD-10-CM

## 2011-08-04 MED ORDER — CEFUROXIME AXETIL 500 MG PO TABS: 500.0000 mg | ORAL_TABLET | Freq: Two times a day (BID) | ORAL | Status: AC

## 2011-08-04 NOTE — Progress Notes (Signed)
  Subjective:     David Cantrell is a 71 y.o. male who presents for evaluation of sinus pain. Symptoms include: congestion, cough, facial pain, headaches, nasal congestion and sinus pressure. Onset of symptoms was a few days ago. Symptoms have been gradually worsening since that time. Past history is significant for emphysema. Patient is a former smoker, quit 1972..  The following portions of the patient's history were reviewed and updated as appropriate: allergies, current medications, past family history, past medical history, past social history, past surgical history and problem list.  Review of Systems Pertinent items are noted in HPI.   Objective:    BP 124/76  Pulse 68  Temp 98.4 F (36.9 C) (Oral)  Wt 228 lb 3.2 oz (103.511 kg)  SpO2 95% General appearance: alert, cooperative, appears stated age and mild distress Ears: normal TM's and external ear canals both ears Nose: green discharge, moderate congestion, turbinates red, swollen, sinus tenderness bilateral Throat: lips, mucosa, and tongue normal; teeth and gums normal Neck: mild anterior cervical adenopathy, supple, symmetrical, trachea midline and thyroid not enlarged, symmetric, no tenderness/mass/nodules Lungs: clear to auscultation bilaterally    Assessment:    Acute bacterial sinusitis.  Back pain-- encouraged pt to use pain med he has,   He has appointment with Dr Ethelene Hal Thursday Plan:    Nasal saline sprays. Nasal steroids per medication orders. Antihistamines per medication orders. Ceftin per medication orders. f/u prn

## 2011-08-04 NOTE — Patient Instructions (Signed)

## 2011-08-18 ENCOUNTER — Other Ambulatory Visit: Payer: Self-pay | Admitting: Internal Medicine

## 2011-08-18 NOTE — Telephone Encounter (Signed)
Dr.Hopper please advise, last OV 08/04/10 (Acute-[place]), seen you on 07/17/11. Medication last filled on 07/31/11 #20

## 2011-08-18 NOTE — Telephone Encounter (Signed)
Okay x1 as written. If symptoms persist; back specialist should be seen again

## 2011-09-21 ENCOUNTER — Encounter: Payer: Medicare Other | Admitting: Internal Medicine

## 2011-11-18 ENCOUNTER — Encounter: Payer: Self-pay | Admitting: Gastroenterology

## 2011-12-12 ENCOUNTER — Other Ambulatory Visit: Payer: Self-pay | Admitting: Internal Medicine

## 2011-12-14 NOTE — Telephone Encounter (Signed)
OV 08/04/11 last filled 08/18/11 # 20 no refills plz advise     MW

## 2011-12-14 NOTE — Telephone Encounter (Signed)
OK #14 , 1-2 qhs prn

## 2011-12-14 NOTE — Telephone Encounter (Signed)
Rx printed and faxed.   MW  

## 2011-12-18 ENCOUNTER — Other Ambulatory Visit: Payer: Self-pay

## 2011-12-18 NOTE — Telephone Encounter (Signed)
Because of age & associated risk of potential balance & mental status status impact; this would be the safest dose (beer's criteria). If this does not control symptoms he would need to be seen.

## 2011-12-18 NOTE — Telephone Encounter (Signed)
Pharmacy called [facility] for pt to advise pt stated usually takes Flexeril 10 mg but we sent in Flexeril 5 mg. Need to verify dosage. Plz advise     MW

## 2011-12-18 NOTE — Telephone Encounter (Signed)
Hopp please advise, I see where Dr.Lowne gave patient 10 mg TID for back pain when he seen her acutely on 06/18/2011. All other times that rx prescribed it was for 5 mg 1-2 by mouth at bedtime as needed (Rx'ed by you)

## 2011-12-18 NOTE — Telephone Encounter (Signed)
Pharmacy aware

## 2011-12-24 ENCOUNTER — Other Ambulatory Visit: Payer: Self-pay | Admitting: Internal Medicine

## 2012-01-05 ENCOUNTER — Ambulatory Visit (INDEPENDENT_AMBULATORY_CARE_PROVIDER_SITE_OTHER): Payer: Medicare Other | Admitting: Internal Medicine

## 2012-01-05 VITALS — BP 136/80 | HR 81 | Temp 98.3°F | Wt 235.0 lb

## 2012-01-05 DIAGNOSIS — B9789 Other viral agents as the cause of diseases classified elsewhere: Secondary | ICD-10-CM

## 2012-01-05 DIAGNOSIS — B349 Viral infection, unspecified: Secondary | ICD-10-CM

## 2012-01-05 DIAGNOSIS — J45990 Exercise induced bronchospasm: Secondary | ICD-10-CM

## 2012-01-05 MED ORDER — ALBUTEROL SULFATE HFA 108 (90 BASE) MCG/ACT IN AERS: 2.0000 | INHALATION_SPRAY | Freq: Four times a day (QID) | RESPIRATORY_TRACT | Status: DC | PRN

## 2012-01-05 MED ORDER — OSELTAMIVIR PHOSPHATE 75 MG PO CAPS: 75.0000 mg | ORAL_CAPSULE | Freq: Two times a day (BID) | ORAL | Status: DC

## 2012-01-05 NOTE — Patient Instructions (Addendum)
Rest, fluids , tylenol For cough, take Mucinex DM twice a day as needed  If the cough continue, take hydrocodone (norco) as needed It wheezing, use albuterol as prescribed  Take Tamiflu twice a day for 5 days if the symptoms are severe, you have high fever, short of breath: go to the ER Call in few days if not improving

## 2012-01-05 NOTE — Progress Notes (Signed)
  Subjective:    Patient ID: David Cantrell, male    DOB: 1940-02-04, 71 y.o.   MRN: [id number]  HPI Symptoms started yesterday acutely: A "raw"feeling in the trachea and upper chest, subjective fever, muscle aches upper and lower extremities, cough with green yellow sputum, moderate to severe headache with cough only.  Past Medical History  Diagnosis Date  . CAD (coronary artery disease)   . Exercise induced bronchospasm    Past Surgical History  Procedure Date  . Coronary catherization     Dr Donnie Aho; X 2 . No stents or CBAG  . Esi     Dr Ethelene Hal; for cervical radiculopathy  . Colonoscopy with polypectomy     X 5; Dr Jarold Motto  . Shoulder surgery     open procedure x2; Dr Fannie Knee & Dr Simonne Come  . Shoulder arthroscopy     L ; Dr Thomasena Edis    Review of Systems No nausea, vomiting, diarrhea or any rash. No shortness of breath per se, some wheezing. No lower extremity edema. Did not have a flu shot this season.     Objective:   Physical Exam General -- alert, well-developed, VSS HEENT -- TMs normal, throat w/o redness, face symmetric and not tender to palpation, no slt congested   Lungs -- normal respiratory effort, no intercostal retractions, no accessory muscle use, and normal breath sounds.   Heart-- normal rate, regular rhythm, no murmur, and no gallop.   Abdomen--soft, non-tender, no distention, no masses, no HSM, no guarding, and no rigidity.   Extremities-- no pretibial edema bilaterally  Psych-- Cognition and judgment appear intact. Alert and cooperative with normal attention span and concentration.  not anxious appearing and not depressed appearing.       Assessment & Plan:  Viral syndrome, Given presentation, this could be influenza. Plan: Will treat with tamiflu, see instructions Early pneumonia may present this way, he knows to the ER if not improving. Currently in no respiratory distress but reports wheezing, Rx  albuterol as needed.

## 2012-01-07 ENCOUNTER — Encounter: Payer: Self-pay | Admitting: Internal Medicine

## 2012-01-12 ENCOUNTER — Telehealth: Payer: Self-pay

## 2012-01-12 MED ORDER — MELOXICAM 7.5 MG PO TABS: ORAL_TABLET | ORAL | Status: DC

## 2012-01-12 NOTE — Telephone Encounter (Signed)
Spoke with patient, informed him per letter from [employer], cyclobenzaprine will no longer be covered as of 01/13/2012. Dr.Hopper selected alternative Meloxicam 7.5 mg daily as needed #30. (letter sent for scanning). Patient verbalized understanding and indicated he received letter as well

## 2012-01-21 ENCOUNTER — Ambulatory Visit (INDEPENDENT_AMBULATORY_CARE_PROVIDER_SITE_OTHER): Payer: PRIVATE HEALTH INSURANCE | Admitting: Internal Medicine

## 2012-01-21 ENCOUNTER — Encounter: Payer: Self-pay | Admitting: Internal Medicine

## 2012-01-21 VITALS — BP 122/88 | HR 76 | Temp 97.5°F | Resp 12 | Ht 73.0 in | Wt 229.0 lb

## 2012-01-21 DIAGNOSIS — I2510 Atherosclerotic heart disease of native coronary artery without angina pectoris: Secondary | ICD-10-CM

## 2012-01-21 DIAGNOSIS — E785 Hyperlipidemia, unspecified: Secondary | ICD-10-CM

## 2012-01-21 DIAGNOSIS — I10 Essential (primary) hypertension: Secondary | ICD-10-CM

## 2012-01-21 DIAGNOSIS — Z0000 Encounter for general adult medical examination without abnormal findings: Secondary | ICD-10-CM

## 2012-01-21 DIAGNOSIS — Z23 Encounter for immunization: Secondary | ICD-10-CM

## 2012-01-21 LAB — BASIC METABOLIC PANEL
BUN: 18 mg/dL (ref 6–23)
Calcium: 9.3 mg/dL (ref 8.4–10.5)
GFR: 46.61 mL/min — ABNORMAL LOW (ref >60.00)
Glucose, Bld: 105 mg/dL — ABNORMAL HIGH (ref 70–99)
Sodium: 138 mEq/L (ref 135–145)

## 2012-01-21 NOTE — Patient Instructions (Addendum)
Please review the medication list in the After Visit Summary provided.Verify name & dosage of meds.Please write the name of the prescribing physician to the right of the medication and share this with all medical staff seen at each appointment. This will help provide continuity of care; help optimize therapeutic interventions;and help prevent drug:drug adverse reaction.  Blood Pressure Goal  Ideally is an AVERAGE < 135/85. This AVERAGE should be calculated from @ least 5-7 BP readings taken @ different times of day on different days of week. You should not respond to isolated BP readings , but rather the AVERAGE for that week . Cardiovascular exercise, this can be as simple a program as walking, is recommended 30-45 minutes 3-4 times per week. If you're not exercising you should take 6-8 weeks to build up to this level.  Eat a low-fat diet with lots of fruits and vegetables, up to 7-9 servings per day. Consume less than 40 grams of sugar per day from foods & drinks with High Fructose Corn Sugar as #2,3 or # 4 on label. Health Care Power of Attorney & Living Will. Complete if not in place ; these place you in charge of your health care decisions.  As per the Standard of Care , screening Colonoscopy recommended @ 50 & every 5-10 years thereafter . More frequent monitor would be dictated by family history or findings @ Colonoscopy Review and correct the record as indicated.  Please share record with all medical staff seen.   If you activate My Chart; the results can be released to you as soon as they populate from the lab. If you choose not to use this program; the labs have to be reviewed, copied & mailed   causing a delay in getting the results to you.

## 2012-01-21 NOTE — Progress Notes (Signed)
Subjective:    Patient ID: David Cantrell, male    DOB: 1940/03/01, 72 y.o.   MRN: [id number]  HPI Medicare Wellness Visit:  The following psychosocial & medical history were reviewed as required by Medicare.   Social history: caffeine: 2-3 cups coffee & occasional chocolate , alcohol:  no ,  tobacco use : quit 1972  & exercise : minimal.   Home & personal  safety / fall risk: no issues, activities of daily living: no limitations , seatbelt use : yes , and smoke alarm employment : yes .  Power of Attorney/Living Will status : NO  Vision ( as recorded per Nurse) & Hearing  evaluation : Ophth exam 7/13; no hearing exam. Orientation :oriented X 3 , memory & recall : , spelling or math testing: good,and mood & affect : normal . Depression / anxiety: denied Travel history : 42 [place]  , immunization status : Flu  needed , transfusion history: no; and preventive health surveillance ( colonoscopies, BMD , etc as per protocol/ [facility]): colonoscopy due, Dental care: as needed . Chart reviewed &  Updated. Active issues reviewed & addressed.       Review of Systems HYPERTENSION: Disease Monitoring: Blood pressure range-118-120/72  Chest pain, palpitations- no      Dyspnea- only EIB Medications: Compliance- yes  Lightheadedness,Syncope- no    Edema- no  HYPERLIPIDEMIA: Disease Monitoring: See symptoms for Hypertension Medications: Compliance- meds & dosage unclear(to be verified)  Abd pain, bowel changes- some temporary viral related bowel changes  Muscle aches- no Labs:performed by Dr Donnie Aho. LFTs normal 06/18/11       Objective:   Physical Exam Gen.:  well-nourished in appearance. Alert, appropriate and cooperative throughout exam. Head: Normocephalic without obvious abnormalities;  pattern alopecia  Eyes: No corneal or conjunctival inflammation noted. Pupils equal round reactive to light and accommodation.  Extraocular motion intact. Vision grossly normal. Ears: External  ear exam  reveals no significant lesions or deformities. Canals clear .TMs normal. Hearing is grossly normal bilaterally. Nose: External nasal exam reveals no deformity or inflammation. Nasal mucosa are pink and moist. No lesions or exudates noted.  Mouth: Oral mucosa and oropharynx reveal no lesions or exudates. Upper plate & lower partial. Neck: No deformities, masses, or tenderness noted. Range of motion &Thyroid normal Lungs: Normal respiratory effort; chest expands symmetrically. Lungs are clear to auscultation without rales, wheezes, or increased work of breathing. Heart: Normal rate and rhythm. Normal S1 and S2. No gallop, click, or rub. S4 w/o murmur. Slightly distant heart sounds Abdomen: Bowel sounds normal; abdomen soft and nontender. No masses, organomegaly or hernias noted. Genitalia/DRE:  Genitalia normal .Prostate is normal without enlargement, asymmetry, nodularity, or induration.    Musculoskeletal/extremities: No deformity or scoliosis noted of  the thoracic or lumbar spine. No clubbing, cyanosis, edema, or deformity noted. Range of motion  normal .Tone & strength  normal.Joints normal. Nail health  good. Vascular: Carotid, radial artery, dorsalis pedis and  posterior tibial pulses are full and equal. No bruits present. Neurologic: Alert and oriented x3. Deep tendon reflexes symmetrical and normal.          Skin: Intact with myriad keratoses. Small polyp R buttock Lymph: No cervical, axillary, or inguinal lymphadenopathy present. Psych: Mood and affect are normal. Normally interactive  Assessment & Plan:  #1 Medicare Wellness Exam; criteria met ; data entered #2 Problem List reviewed ; Assessment/ Recommendations made Plan: see Orders

## 2012-01-22 ENCOUNTER — Encounter: Payer: Self-pay | Admitting: Gastroenterology

## 2012-02-04 ENCOUNTER — Ambulatory Visit (AMBULATORY_SURGERY_CENTER): Payer: PRIVATE HEALTH INSURANCE | Admitting: *Deleted

## 2012-02-04 ENCOUNTER — Telehealth: Payer: Self-pay | Admitting: Gastroenterology

## 2012-02-04 VITALS — Ht 73.0 in | Wt 234.2 lb

## 2012-02-04 DIAGNOSIS — Z1211 Encounter for screening for malignant neoplasm of colon: Secondary | ICD-10-CM

## 2012-02-04 DIAGNOSIS — Z860100 Personal history of colon polyps, unspecified: Secondary | ICD-10-CM

## 2012-02-04 MED ORDER — MOVIPREP 100 G PO SOLR: 1.0000 | Freq: Once | ORAL | Status: DC

## 2012-02-04 NOTE — Progress Notes (Signed)
No allergy to eggs or soy. ewm  No problems with sedation or intubation. ewm

## 2012-02-04 NOTE — Telephone Encounter (Signed)
Rx for MoviPrep called in to [facility] on [address].  Pt notified.

## 2012-02-09 ENCOUNTER — Encounter: Payer: Self-pay | Admitting: Gastroenterology

## 2012-02-17 ENCOUNTER — Encounter: Payer: Self-pay | Admitting: Gastroenterology

## 2012-02-17 ENCOUNTER — Ambulatory Visit (AMBULATORY_SURGERY_CENTER): Payer: PRIVATE HEALTH INSURANCE | Admitting: Gastroenterology

## 2012-02-17 VITALS — BP 103/56 | HR 57 | Temp 97.3°F | Resp 17 | Ht 73.0 in | Wt 234.0 lb

## 2012-02-17 DIAGNOSIS — D126 Benign neoplasm of colon, unspecified: Secondary | ICD-10-CM

## 2012-02-17 DIAGNOSIS — Z1211 Encounter for screening for malignant neoplasm of colon: Secondary | ICD-10-CM

## 2012-02-17 DIAGNOSIS — Z86010 Personal history of colonic polyps: Secondary | ICD-10-CM

## 2012-02-17 DIAGNOSIS — K5730 Diverticulosis of large intestine without perforation or abscess without bleeding: Secondary | ICD-10-CM

## 2012-02-17 MED ORDER — SODIUM CHLORIDE 0.9 % IV SOLN: 500.0000 mL | INTRAVENOUS | Status: DC

## 2012-02-17 MED ORDER — HYOSCYAMINE SULFATE 0.125 MG SL SUBL: 0.1250 mg | SUBLINGUAL_TABLET | SUBLINGUAL | Status: DC | PRN

## 2012-02-17 NOTE — Op Note (Signed)
[facility] [address] [place], [address]   COLONOSCOPY PROCEDURE REPORT  PATIENT: David Cantrell, David Cantrell  MR#: [id number] BIRTHDATE: 1940-05-30 , 71  yrs. old GENDER: Male ENDOSCOPIST: Mardella Layman, MD, [facility] REFERRED BY: PROCEDURE DATE:  02/17/2012 PROCEDURE:   Colonoscopy with snare polypectomy ASA CLASS:   Class III INDICATIONS:Patient's personal history of adenomatous colon polyps.  MEDICATIONS: propofol (Diprivan) 300mg  IV  DESCRIPTION OF PROCEDURE:   After the risks and benefits and of the procedure were explained, informed consent was obtained.  A digital rectal exam revealed no abnormalities of the rectum.    The LB CF-[id number]  endoscope was introduced through the anus and advanced to the cecum, which was identified by both the appendix and ileocecal valve .  The quality of the prep was excellent, using MoviPrep .  The instrument was then slowly withdrawn as the colon was fully examined.     COLON FINDINGS: Flat 5 mmm polyp cold snare removed from cecim...jar #1. Five small smooth sessile polyps were found in the sigmoid colon.  A polypectomy was performed with a cold snare.  The resection was complete and the polyp tissue was completely retrieved.   There was moderate diverticulosis noted in the sigmoid colon and descending colon with associated petechiae, colonic narrowing and muscular hypertrophy.     Retroflexed views revealed no abnormalities.     The scope was then withdrawn from the patient and the procedure completed.  COMPLICATIONS: There were no complications. ENDOSCOPIC IMPRESSION: 1.   Five small sessile polyps were found in the sigmoid colon; polypectomy was performed with a cold snare 2.   There was moderate diverticulosis noted in the sigmoid colon and descending colon 3. Cecal polyp removed.Marland KitchenMarland Kitchenprobable adenoma RECOMMENDATIONS: 1.  Await biopsy results 2.  Repeat Colonoscopy in 3 years. 3.  High fiber diet   REPEAT  EXAM:  cc:  _______________________________ eSignedMardella Layman, MD, [facility] 02/17/2012 9:03 AM

## 2012-02-17 NOTE — Patient Instructions (Signed)
YOU HAD AN ENDOSCOPIC PROCEDURE TODAY AT THE [facility]: Refer to the procedure report that was given to you for any specific questions about what was found during the examination.  If the procedure report does not answer your questions, please call your gastroenterologist to clarify.  If you requested that your care partner not be given the details of your procedure findings, then the procedure report has been included in a sealed envelope for you to review at your convenience later.  YOU SHOULD EXPECT: Some feelings of bloating in the abdomen. Passage of more gas than usual.  Walking can help get rid of the air that was put into your GI tract during the procedure and reduce the bloating. If you had a lower endoscopy (such as a colonoscopy or flexible sigmoidoscopy) you may notice spotting of blood in your stool or on the toilet paper. If you underwent a bowel prep for your procedure, then you may not have a normal bowel movement for a few days.  DIET: Your first meal following the procedure should be a light meal and then it is ok to progress to your normal diet.  A half-sandwich or bowl of soup is an example of a good first meal.  Heavy or fried foods are harder to digest and may make you feel nauseous or bloated.  Likewise meals heavy in dairy and vegetables can cause extra gas to form and this can also increase the bloating.  Drink plenty of fluids but you should avoid alcoholic beverages for 24 hours.  ACTIVITY: Your care partner should take you home directly after the procedure.  You should plan to take it easy, moving slowly for the rest of the day.  You can resume normal activity the day after the procedure however you should NOT DRIVE or use heavy machinery for 24 hours (because of the sedation medicines used during the test).    SYMPTOMS TO REPORT IMMEDIATELY: A gastroenterologist can be reached at any hour.  During normal business hours, 8:30 AM to 5:00 PM Monday through Friday,  call [phone].  After hours and on weekends, please call the GI answering service at [phone] who will take a message and have the physician on call contact you.   Following lower endoscopy (colonoscopy or flexible sigmoidoscopy):  Excessive amounts of blood in the stool  Significant tenderness or worsening of abdominal pains  Swelling of the abdomen that is new, acute  Fever of 100F or higher    FOLLOW UP: If any biopsies were taken you will be contacted by phone or by letter within the next 1-3 weeks.  Call your gastroenterologist if you have not heard about the biopsies in 3 weeks.  Our staff will call the home number listed on your records the next business day following your procedure to check on you and address any questions or concerns that you may have at that time regarding the information given to you following your procedure. This is a courtesy call and so if there is no answer at the home number and we have not heard from you through the emergency physician on call, we will assume that you have returned to your regular daily activities without incident.  SIGNATURES/CONFIDENTIALITY: You and/or your care partner have signed paperwork which will be entered into your electronic medical record.  These signatures attest to the fact that that the information above on your After Visit Summary has been reviewed and is understood.  Full responsibility of the confidentiality   of this discharge information lies with you and/or your care-partner.   Information on polyps ,diverticulosis ,& high fiber diet given to you today 

## 2012-02-17 NOTE — Progress Notes (Signed)
Patient did not experience any of the following events: a burn prior to discharge; a fall within the facility; wrong site/side/patient/procedure/implant event; or a hospital transfer or hospital admission upon discharge from the facility. (G8907) Patient did not have preoperative order for IV antibiotic SSI prophylaxis. (G8918)  

## 2012-02-18 ENCOUNTER — Telehealth: Payer: Self-pay

## 2012-02-18 NOTE — Telephone Encounter (Signed)
  Follow up Call-  Call back number 02/17/2012  Post procedure Call Back phone  # [phone]  Permission to leave phone message Yes     Patient questions:  Do you have a fever, pain , or abdominal swelling? no Pain Score  0 *  Have you tolerated food without any problems? yes  Have you been able to return to your normal activities? yes  Do you have any questions about your discharge instructions: Diet   no Medications  no Follow up visit  no  Do you have questions or concerns about your Care? no  Actions: * If pain score is 4 or above: No action needed, pain <4.

## 2012-02-22 ENCOUNTER — Encounter: Payer: Self-pay | Admitting: Gastroenterology

## 2012-04-08 ENCOUNTER — Telehealth: Payer: Self-pay | Admitting: Internal Medicine

## 2012-04-08 MED ORDER — FENOFIBRATE MICRONIZED 200 MG PO CAPS: ORAL_CAPSULE | ORAL | Status: DC

## 2012-04-08 NOTE — Telephone Encounter (Signed)
RX sent electronically 

## 2012-04-08 NOTE — Telephone Encounter (Signed)
Patient's wife called stating they have switched to [facility] [address]. Patient needs fenofibrate 200mg  sent ASAP.

## 2012-04-29 ENCOUNTER — Telehealth: Payer: Self-pay | Admitting: Internal Medicine

## 2012-04-29 MED ORDER — METOPROLOL TARTRATE 25 MG PO TABS: ORAL_TABLET | ORAL | Status: DC

## 2012-04-29 NOTE — Telephone Encounter (Signed)
Pt wife called bc her husband needs a rx for his blood pressure medicine called into [facility]  on [address] [place]

## 2012-04-29 NOTE — Telephone Encounter (Signed)
Refill done.  

## 2012-04-29 NOTE — Telephone Encounter (Signed)
Pt wife called in and stated that her husband is out of blood pressure medicine please call [facility] on [address] in [place] also patient would like to change his pharmacy to [facility].

## 2012-05-31 ENCOUNTER — Encounter: Payer: Self-pay | Admitting: Cardiology

## 2012-05-31 NOTE — Progress Notes (Signed)
Patient ID: David Cantrell, male   DOB: 1940/01/25, 72 y.o.   MRN: [id number]  Cantrell, David T  Date of visit:  05/31/2012 DOB:  04/06/1940    Age:  71 yrs. Medical record number:  [id number]     Account number:  [id number] Primary Care Provider: Marga Melnick Cantrell ____________________________ CURRENT DIAGNOSES  1. CAD,Native  2. Hypertension-Essential (Benign)  3. Hyperlipidemia  4. Angina Pectoris  5. Obesity(BMI30-40)  6. Dyspnea  7. Long Term Use of Other Medicine ____________________________ ALLERGIES  Altace, Cough  codeine sulfate, Constipation  Lipitor, Leg aches  Niaspan Extended-Release, Flushing  Sulfonamides, Rash ____________________________ MEDICATIONS  1. Fish Oil 300-1,000 mg capsule, BID  2. garlic 1 mg capsule, 1 p.o. daily  3. Flaxseed Oil 1,000 mg Capsule, BID  4. Multivitamin Tablet, 1 p.o. daily  5. Ventolin HFA 90 mcg/Actuation HFA Aerosol Inhaler, PRN  6. ProAir HFA 90 mcg/Actuation HFA Aerosol Inhaler, PRN  7. metoprolol tartrate 25 mg Tablet, 1/2 tab b.i.d.  8. Vitamin B-12 1,000 mcg Tablet, BID  9. Tylenol PM Extra Strength 25-500 mg Tablet, QHS  10. coQ10 (ubiquinol) 200 mg Capsule, 1 p.o. daily  11. aspirin 81 mg Tablet, Chewable, 1 p.o. q.d.  12. nitroglycerin 0.4 mg tablet, sublingual, PRN  13. fenofibrate 160 mg tablet, 1 p.o. daily  14. Crestor 10 mg tablet, 1 p.o. daily ____________________________ CHIEF COMPLAINTS  Followup of CAD,Native ____________________________ HISTORY OF PRESENT ILLNESS  Patient seen for cardiac followup. He has had a good year since he was previously here. He is exercising on a regular basis. He denies angina and has no PND, orthopnea, syncope, palpitations, or claudication. Since he was previously here he discontinued taking atorvastatin because of myalgias and is not currently taking a statin agent despite the presence of CAD. He was started on gemfibrozil recently. On reviewing the records I don't see that he has ever  been tried on Crestor. ___________________________ PAST HISTORY  Past Medical Illnesses:  hypertension, hyperlipidemia, cervical disc, obesity;  Cardiovascular Illnesses:  CAD;  Surgical Procedures:  hemorrhoidectomy, shoulder surgery-bil;  Cardiology Procedures-Invasive:  cardiac cath (left) May 2012;  Cardiology Procedures-Noninvasive:  treadmill cardiolite September 2008, treadmill cardiolite May 2012;  Cardiac Cath Results:  normal Left main, 40% stenosis proximal mid LAD, dominant CFX, 70% stenosis distal CFX, 80% stenosis proximal PDA, small and nondominant RCA;  LVEF of 57% documented via nuclear study on 05/29/2010,   ____________________________ CARDIO-PULMONARY TEST DATES EKG Date:  05/15/2010;   Cardiac Cath Date:  06/05/2010;  Nuclear Study Date:  05/29/2010;  Chest Xray Date: 06/02/2010;   ____________________________ SOCIAL HISTORY Alcohol Use:  no alcohol use;  Smoking:  used to smoke but quit Prior to 1980;  Diet:  [employer];  Lifestyle:  divorced, remarried and 13 years;  Exercise:  weight lifting;  Occupation:  retired, worked at [employer];  Residence:  lives with wife;   ____________________________ REVIEW OF SYSTEMS General:  denies recent weight change, fatique or change in exercise tolerance. Ears, Nose, Throat, Mouth:  tinnitus, recent sinusitis Respiratory: denies dyspnea, cough, wheezing or hemoptysis. Cardiovascular:  please review HPI Abdominal: hemmorroids Genitourinary-Male: erectile dysfunction  Musculoskeletal:  cervical spondylosis  ____________________________ PHYSICAL EXAMINATION VITAL SIGNS  Blood Pressure:  128/80 Sitting, Left arm, regular cuff  , 128/82 Standing, Left arm and regular cuff   Pulse:  76/min. Weight:  232.00 lbs. Height:  73"BMI: 30  Constitutional:  pleasant white male in no acute distress Skin:  warm and dry to touch, no apparent  skin lesions, or masses noted. Head:  normocephalic, balding male hair pattern Neck:  supple, without  massess. No JVD, thyromegaly or carotid bruits. Carotid upstroke normal. Chest:  normal symmetry, clear to auscultation and percussion. Cardiac:  regular rhythm, normal S1 and S2, No S3 or S4, no murmurs, gallops or rubs detected. Peripheral Pulses:  the femoral,dorsalis pedis, and posterior tibial pulses are full and equal bilaterally with no bruits auscultated. Extremities & Back:  no deformities, clubbing, cyanosis, erythema or edema observed. Normal muscle strength and tone. Neurological:  no gross motor or sensory deficits noted, affect appropriate, oriented x3. ____________________________ MOST RECENT LIPID PANEL 03/21/12  CHOL TOTL 224 mg/dl, LDL 161 calc, HDL 32 mg/dl, TRIGLYCER 096 mg/dl and CHOL/HDL 6.9 (Calc) ____________________________ IMPRESSIONS/PLAN  1. Coronary artery disease with no angina 2. Hyperlipidemia not currently on a statin 3. Hypertension 4. Obesity with need to lose weight  Recommendations:  Discussed importance of weight loss and dietary compliance. Recommended followup in one year. I initiated him on Crestor 10 mg twice weekly and he should have a followup lipid panel in 2 months. Hopefully he will be able to take intermittent statins in light of his known coronary artery disease. ____________________________ TODAYS ORDERS  1. Return Visit: 1 year  2. 12 Lead EKG: 1 year  3. Lipid Panel: 2 months                       ____________________________ Cardiology Physician:  Darden Palmer MD [facility]

## 2012-07-09 ENCOUNTER — Other Ambulatory Visit: Payer: Self-pay | Admitting: Internal Medicine

## 2012-07-11 NOTE — Telephone Encounter (Signed)
Refill done.  

## 2012-10-12 ENCOUNTER — Other Ambulatory Visit: Payer: Self-pay | Admitting: Internal Medicine

## 2012-10-13 NOTE — Telephone Encounter (Signed)
Refill done.  

## 2012-10-18 ENCOUNTER — Ambulatory Visit (INDEPENDENT_AMBULATORY_CARE_PROVIDER_SITE_OTHER): Payer: PRIVATE HEALTH INSURANCE

## 2012-10-18 DIAGNOSIS — Z23 Encounter for immunization: Secondary | ICD-10-CM

## 2012-10-31 ENCOUNTER — Other Ambulatory Visit: Payer: Self-pay | Admitting: Dermatology

## 2012-11-03 ENCOUNTER — Ambulatory Visit: Payer: PRIVATE HEALTH INSURANCE

## 2012-11-03 ENCOUNTER — Ambulatory Visit (INDEPENDENT_AMBULATORY_CARE_PROVIDER_SITE_OTHER): Payer: PRIVATE HEALTH INSURANCE | Admitting: *Deleted

## 2012-11-03 DIAGNOSIS — Z23 Encounter for immunization: Secondary | ICD-10-CM

## 2012-11-03 DIAGNOSIS — Z2911 Encounter for prophylactic immunotherapy for respiratory syncytial virus (RSV): Secondary | ICD-10-CM

## 2012-12-19 ENCOUNTER — Other Ambulatory Visit: Payer: Self-pay | Admitting: Internal Medicine

## 2012-12-20 ENCOUNTER — Other Ambulatory Visit: Payer: Self-pay | Admitting: Internal Medicine

## 2012-12-20 NOTE — Telephone Encounter (Signed)
Meloxicam refilled per protocol

## 2012-12-21 NOTE — Telephone Encounter (Signed)
Meloxicam refilled per protocol 

## 2012-12-31 ENCOUNTER — Encounter: Payer: Self-pay | Admitting: Internal Medicine

## 2012-12-31 ENCOUNTER — Ambulatory Visit (INDEPENDENT_AMBULATORY_CARE_PROVIDER_SITE_OTHER): Payer: PRIVATE HEALTH INSURANCE | Admitting: Internal Medicine

## 2012-12-31 VITALS — BP 118/72 | HR 65 | Temp 97.7°F | Wt 238.4 lb

## 2012-12-31 DIAGNOSIS — R350 Frequency of micturition: Secondary | ICD-10-CM

## 2012-12-31 DIAGNOSIS — R319 Hematuria, unspecified: Secondary | ICD-10-CM

## 2012-12-31 DIAGNOSIS — R109 Unspecified abdominal pain: Secondary | ICD-10-CM

## 2012-12-31 LAB — POCT URINALYSIS DIPSTICK
Glucose, UA: NEGATIVE
Nitrite, UA: NEGATIVE
Urobilinogen, UA: 0.2

## 2012-12-31 MED ORDER — CEFTRIAXONE SODIUM 1 G IJ SOLR
1.0000 g | Freq: Once | INTRAMUSCULAR | Status: AC
  Administered 2012-12-31: 1 g via INTRAMUSCULAR

## 2012-12-31 MED ORDER — AMPICILLIN 500 MG PO CAPS: 500.0000 mg | ORAL_CAPSULE | Freq: Four times a day (QID) | ORAL | Status: DC

## 2012-12-31 MED ORDER — OXYCODONE-ACETAMINOPHEN 5-325 MG PO TABS: 1.0000 | ORAL_TABLET | Freq: Three times a day (TID) | ORAL | Status: DC | PRN

## 2012-12-31 NOTE — Progress Notes (Signed)
   Subjective:    Patient ID: David Cantrell, male    DOB: 12/17/1940, 72 y.o.   MRN: [id number]  Urinary Frequency  This is a new problem. The current episode started yesterday. The problem has been waxing and waning. The quality of the pain is described as aching, burning and shooting. The pain is moderate. There has been no fever. There is a history of pyelonephritis (in 1960s). Associated symptoms include flank pain (right x 3 weeks), frequency, hesitancy (chronic) and urgency. Pertinent negatives include no chills, hematuria, nausea or vomiting. He has tried increased fluids and acetaminophen for the symptoms. The treatment provided mild relief. There is no history of kidney stones (but FH stones), urinary stasis or a urological procedure.   Past Medical History  Diagnosis Date  . CAD (coronary artery disease)   . Exercise induced bronchospasm     no childhood asthma  . Other and unspecified hyperlipidemia   . Hypertension   . Allergy     seasonal allergies    Review of Systems  Constitutional: Negative for chills.  Gastrointestinal: Negative for nausea and vomiting.  Genitourinary: Positive for hesitancy (chronic), urgency, frequency and flank pain (right x 3 weeks). Negative for hematuria.       Objective:   Physical Exam BP 118/72  Pulse 65  Temp(Src) 97.7 F (36.5 C) (Oral)  Wt 238 lb 6.4 oz (108.138 kg)  SpO2 93% Wt Readings from Last 3 Encounters:  12/31/12 238 lb 6.4 oz (108.138 kg)  02/17/12 234 lb (106.142 kg)  02/04/12 234 lb 3.2 oz (106.232 kg)   Constitutional: he appears well-developed and well-nourished. No distress.  wife at side  Neck: Normal range of motion. Neck supple. No JVD present. No thyromegaly present.  Cardiovascular: Normal rate, regular rhythm and normal heart sounds.  No murmur heard. No BLE edema. Pulmonary/Chest: Effort normal and breath sounds normal. No respiratory distress. he has no wheezes.  Abdominal: suprapubic discomfort to palp  but soft. Bowel sounds are normal. he exhibits no distension. no masses Musculoskeletal: mild R flank pain -back with normal range of motion, no joint effusions. No gross deformities Skin: no shingles. Skin is warm and dry. No rash noted. No erythema.    Lab Results  Component Value Date   WBC 5.7 02/21/2009   HGB 14.4 02/21/2009   HCT 42.8 02/21/2009   PLT 232.0 02/21/2009   GLUCOSE 105* 01/21/2012   CHOL 137 02/21/2009   TRIG 231.0* 02/21/2009   HDL 33.50* 02/21/2009   LDLDIRECT 74.7 02/21/2009   LDLCALC 71 12/13/2007   ALT 29 06/18/2011   AST 34 06/18/2011   NA 138 01/21/2012   K 4.1 01/21/2012   CL 105 01/21/2012   CREATININE 1.6* 01/21/2012   BUN 18 01/21/2012   CO2 27 01/21/2012   TSH 3.35 01/21/2012   PSA 1.72 02/21/2009   POC urine with mod blood and few LE, no nitrate    Assessment & Plan:   R flank pain - radiating to R testicle  ?stone vs infection associated with Urinary freq and dysuria -  Abn POC urine reviewed   IM rocpehin today and start ampicillin pending Ucx - erx done  Refer for CT noncm to r/o stone (to be done 12/22 - ok for pt to cancel if MARKEDLY improved symptoms)  #20 oxycodone to use if needed for increase pain

## 2012-12-31 NOTE — Progress Notes (Signed)
Pre-visit discussion using our clinic review tool. No additional management support is needed unless otherwise documented below in the visit note.  

## 2012-12-31 NOTE — Patient Instructions (Addendum)
It was good to see you today.  We have reviewed your prior records including labs and tests today  Test(s) ordered today. Your results will be released to MyChart (or called to you) after review, usually within 72hours after test completion. If any changes need to be made, you will be notified at that same time.  Injection of Rocehin antibiotics given today  we'll make referral for CT scan to look for kidney stone on Mon 01/02/13 . Our office will contact you regarding appointment(s) once made.  Percocet to use if needed for pain Start ampicillin antibiotics 3x/day until Urine culture results are back  Kidney Stones Kidney stones (urolithiasis) are solid masses that form inside your kidneys. The intense pain is caused by the stone moving through the kidney, ureter, bladder, and urethra (urinary tract). When the stone moves, the ureter starts to spasm around the stone. The stone is usually passed in your pee (urine).  HOME CARE  Drink enough fluids to keep your pee clear or pale yellow. This helps to get the stone out.  Strain all pee through the provided strainer. Do not pee without peeing through the strainer, not even once. If you pee the stone out, catch it in the strainer. The stone may be as small as a grain of salt. Take this to your doctor. This will help your doctor figure out what you can do to try to prevent more kidney stones.  Only take medicine as told by your doctor.  Follow up with your doctor as told.  Get follow-up X-rays as told by your doctor. GET HELP IF: You have pain that gets worse even if you have been taking pain medicine. GET HELP RIGHT AWAY IF:   Your pain does not get better with medicine.  You have a fever or shaking chills.  Your pain increases and gets worse over 18 hours.  You have new belly (abdominal) pain.  You feel faint or pass out.  You are unable to pee. MAKE SURE YOU:   Understand these instructions.  Will watch your  condition.  Will get help right away if you are not doing well or get worse. Document Released: 06/17/2007 Document Revised: 08/31/2012 Document Reviewed: 06/01/2012 [facility] Patient Information 2014 [place], [place].

## 2013-01-02 ENCOUNTER — Encounter: Payer: Self-pay | Admitting: Internal Medicine

## 2013-01-02 ENCOUNTER — Ambulatory Visit (INDEPENDENT_AMBULATORY_CARE_PROVIDER_SITE_OTHER)
Admission: RE | Admit: 2013-01-02 | Discharge: 2013-01-02 | Disposition: A | Discharge status: Home / Self Care | Payer: PRIVATE HEALTH INSURANCE | Source: Ambulatory Visit | Attending: Internal Medicine | Admitting: Internal Medicine

## 2013-01-02 DIAGNOSIS — R109 Unspecified abdominal pain: Secondary | ICD-10-CM

## 2013-01-02 DIAGNOSIS — R350 Frequency of micturition: Secondary | ICD-10-CM

## 2013-01-02 DIAGNOSIS — R319 Hematuria, unspecified: Secondary | ICD-10-CM

## 2013-01-03 ENCOUNTER — Telehealth: Payer: Self-pay | Admitting: *Deleted

## 2013-01-03 NOTE — Telephone Encounter (Signed)
Gayle,pts wife called states pain medication is causing a headache.  Please advise

## 2013-01-04 MED ORDER — TRAMADOL HCL 50 MG PO TABS: 50.0000 mg | ORAL_TABLET | Freq: Three times a day (TID) | ORAL | Status: DC | PRN

## 2013-01-04 NOTE — Telephone Encounter (Signed)
Ok to try tramadol as needed in place of oxy - rx faxed to local pharmacy

## 2013-01-04 NOTE — Telephone Encounter (Signed)
Called pt no answer LMOM with md response. Faxed script to [facility]...Raechel Chute

## 2013-01-07 ENCOUNTER — Other Ambulatory Visit: Payer: Self-pay | Admitting: Internal Medicine

## 2013-01-09 ENCOUNTER — Other Ambulatory Visit: Payer: Self-pay | Admitting: Internal Medicine

## 2013-01-09 NOTE — Telephone Encounter (Signed)
Fenofibrate and Metoprolol refilled per protocol. 30 days only since pt needs OV. JG//CMA

## 2013-01-09 NOTE — Telephone Encounter (Signed)
Fenofibrate and metoprolol refilled per protocol. JG//CMA

## 2013-01-10 ENCOUNTER — Other Ambulatory Visit: Payer: Self-pay | Admitting: Internal Medicine

## 2013-01-10 NOTE — Telephone Encounter (Signed)
Fenofibrate refilled per protocol. JG//CMA 

## 2013-01-11 ENCOUNTER — Other Ambulatory Visit: Payer: Self-pay | Admitting: Internal Medicine

## 2013-01-11 ENCOUNTER — Telehealth: Payer: Self-pay | Admitting: Internal Medicine

## 2013-01-11 NOTE — Telephone Encounter (Signed)
Is a message attached to this patient's chart thank you

## 2013-01-11 NOTE — Telephone Encounter (Signed)
No message. Phone encounter was error for this patient and routed in error. My apologies.

## 2013-01-11 NOTE — Telephone Encounter (Addendum)
error 

## 2013-01-16 ENCOUNTER — Telehealth: Payer: Self-pay

## 2013-01-16 NOTE — Telephone Encounter (Signed)
Update pharmacy info

## 2013-02-02 ENCOUNTER — Ambulatory Visit (HOSPITAL_COMMUNITY): Payer: Medicare HMO | Admitting: Anesthesiology

## 2013-02-02 ENCOUNTER — Other Ambulatory Visit: Payer: Self-pay | Admitting: Urology

## 2013-02-02 ENCOUNTER — Encounter (HOSPITAL_COMMUNITY): Payer: Self-pay | Admitting: *Deleted

## 2013-02-02 ENCOUNTER — Encounter (HOSPITAL_COMMUNITY): Payer: Medicare HMO | Admitting: Anesthesiology

## 2013-02-02 ENCOUNTER — Ambulatory Visit (HOSPITAL_COMMUNITY)
Admission: RE | Admit: 2013-02-02 | Discharge: 2013-02-02 | Disposition: A | Discharge status: Home / Self Care | Payer: Medicare HMO | Source: Ambulatory Visit | Attending: Urology | Admitting: Urology

## 2013-02-02 ENCOUNTER — Encounter (HOSPITAL_COMMUNITY)
Admission: RE | Disposition: A | Discharge status: Home / Self Care | Payer: Self-pay | Source: Ambulatory Visit | Attending: Urology

## 2013-02-02 DIAGNOSIS — E785 Hyperlipidemia, unspecified: Secondary | ICD-10-CM | POA: Insufficient documentation

## 2013-02-02 DIAGNOSIS — I2510 Atherosclerotic heart disease of native coronary artery without angina pectoris: Secondary | ICD-10-CM | POA: Insufficient documentation

## 2013-02-02 DIAGNOSIS — N1330 Unspecified hydronephrosis: Secondary | ICD-10-CM | POA: Insufficient documentation

## 2013-02-02 DIAGNOSIS — N200 Calculus of kidney: Secondary | ICD-10-CM | POA: Insufficient documentation

## 2013-02-02 DIAGNOSIS — I10 Essential (primary) hypertension: Secondary | ICD-10-CM | POA: Insufficient documentation

## 2013-02-02 DIAGNOSIS — N201 Calculus of ureter: Secondary | ICD-10-CM | POA: Insufficient documentation

## 2013-02-02 DIAGNOSIS — N135 Crossing vessel and stricture of ureter without hydronephrosis: Secondary | ICD-10-CM

## 2013-02-02 HISTORY — PX: CYSTOSCOPY WITH RETROGRADE PYELOGRAM, URETEROSCOPY AND STENT PLACEMENT: SHX5789

## 2013-02-02 LAB — BASIC METABOLIC PANEL
BUN: 22 mg/dL (ref 6–23)
CHLORIDE: 103 meq/L (ref 96–112)
CO2: 23 mEq/L (ref 19–32)
Calcium: 9.3 mg/dL (ref 8.4–10.5)
Creatinine, Ser: 1.89 mg/dL — ABNORMAL HIGH (ref 0.50–1.35)
GFR calc Af Amer: 40 mL/min — ABNORMAL LOW (ref >90)
GFR, EST NON AFRICAN AMERICAN: 34 mL/min — AB (ref >90)
Glucose, Bld: 103 mg/dL — ABNORMAL HIGH (ref 70–99)
Potassium: 3.9 mEq/L (ref 3.7–5.3)
Sodium: 141 mEq/L (ref 137–147)

## 2013-02-02 LAB — CBC
HEMATOCRIT: 41.5 % (ref 39.0–52.0)
HEMOGLOBIN: 14.5 g/dL (ref 13.0–17.0)
MCH: 30.9 pg (ref 26.0–34.0)
MCHC: 34.9 g/dL (ref 30.0–36.0)
MCV: 88.5 fL (ref 78.0–100.0)
Platelets: 220 10*3/uL (ref 150–400)
RBC: 4.69 MIL/uL (ref 4.22–5.81)
RDW: 12.6 % (ref 11.5–15.5)
WBC: 9 10*3/uL (ref 4.0–10.5)

## 2013-02-02 SURGERY — CYSTOURETEROSCOPY, WITH RETROGRADE PYELOGRAM AND STENT INSERTION
Anesthesia: General | Laterality: Right

## 2013-02-02 MED ORDER — LIDOCAINE HCL 2 % EX GEL
CUTANEOUS | Status: AC
  Filled 2013-02-02: qty 10

## 2013-02-02 MED ORDER — FENTANYL CITRATE 0.05 MG/ML IJ SOLN: 25.0000 ug | INTRAMUSCULAR | Status: DC | PRN

## 2013-02-02 MED ORDER — PROPOFOL 10 MG/ML IV BOLUS
INTRAVENOUS | Status: AC
  Filled 2013-02-02: qty 20

## 2013-02-02 MED ORDER — FENTANYL CITRATE 0.05 MG/ML IJ SOLN
INTRAMUSCULAR | Status: DC | PRN
  Administered 2013-02-02 (×2): 100 ug via INTRAVENOUS

## 2013-02-02 MED ORDER — PHENYLEPHRINE HCL 10 MG/ML IJ SOLN
INTRAMUSCULAR | Status: DC | PRN
  Administered 2013-02-02: 80 ug via INTRAVENOUS

## 2013-02-02 MED ORDER — ONDANSETRON HCL 4 MG/2ML IJ SOLN
INTRAMUSCULAR | Status: DC | PRN
  Administered 2013-02-02: 4 mg via INTRAVENOUS

## 2013-02-02 MED ORDER — BELLADONNA ALKALOIDS-OPIUM 16.2-60 MG RE SUPP
RECTAL | Status: DC | PRN
  Administered 2013-02-02: 1 via RECTAL

## 2013-02-02 MED ORDER — CEPHALEXIN 500 MG PO CAPS: 500.0000 mg | ORAL_CAPSULE | Freq: Three times a day (TID) | ORAL | Status: DC

## 2013-02-02 MED ORDER — ALFUZOSIN HCL ER 10 MG PO TB24: 10.0000 mg | ORAL_TABLET | Freq: Every day | ORAL | Status: DC

## 2013-02-02 MED ORDER — ONDANSETRON HCL 4 MG/2ML IJ SOLN
INTRAMUSCULAR | Status: AC
Start: 2013-02-02 — End: 2013-02-02
  Filled 2013-02-02: qty 2

## 2013-02-02 MED ORDER — MIDAZOLAM HCL 5 MG/5ML IJ SOLN
INTRAMUSCULAR | Status: DC | PRN
  Administered 2013-02-02: 1 mg via INTRAVENOUS

## 2013-02-02 MED ORDER — DEXAMETHASONE SODIUM PHOSPHATE 10 MG/ML IJ SOLN
INTRAMUSCULAR | Status: DC | PRN
  Administered 2013-02-02: 10 mg via INTRAVENOUS

## 2013-02-02 MED ORDER — MIDAZOLAM HCL 2 MG/2ML IJ SOLN
INTRAMUSCULAR | Status: AC
  Filled 2013-02-02: qty 2

## 2013-02-02 MED ORDER — BELLADONNA ALKALOIDS-OPIUM 16.2-60 MG RE SUPP
RECTAL | Status: AC
  Filled 2013-02-02: qty 1

## 2013-02-02 MED ORDER — LIDOCAINE HCL 2 % EX GEL
CUTANEOUS | Status: DC | PRN
  Administered 2013-02-02: 1

## 2013-02-02 MED ORDER — PHENAZOPYRIDINE HCL 100 MG PO TABS
100.0000 mg | ORAL_TABLET | Freq: Three times a day (TID) | ORAL | Status: DC | PRN
Start: 2013-02-02 — End: 2013-02-24

## 2013-02-02 MED ORDER — IOHEXOL 300 MG/ML  SOLN
INTRAMUSCULAR | Status: DC | PRN
  Administered 2013-02-02: 10 mL

## 2013-02-02 MED ORDER — CEFAZOLIN SODIUM-DEXTROSE 2-3 GM-% IV SOLR
2.0000 g | INTRAVENOUS | Status: AC
  Administered 2013-02-02: 2 g via INTRAVENOUS

## 2013-02-02 MED ORDER — PROMETHAZINE HCL 25 MG/ML IJ SOLN: 6.2500 mg | INTRAMUSCULAR | Status: DC | PRN

## 2013-02-02 MED ORDER — DEXAMETHASONE SODIUM PHOSPHATE 10 MG/ML IJ SOLN
INTRAMUSCULAR | Status: AC
Start: 2013-02-02 — End: 2013-02-02
  Filled 2013-02-02: qty 1

## 2013-02-02 MED ORDER — SENNOSIDES-DOCUSATE SODIUM 8.6-50 MG PO TABS: 1.0000 | ORAL_TABLET | Freq: Two times a day (BID) | ORAL | Status: DC

## 2013-02-02 MED ORDER — CEFAZOLIN SODIUM-DEXTROSE 2-3 GM-% IV SOLR
INTRAVENOUS | Status: AC
  Filled 2013-02-02: qty 50

## 2013-02-02 MED ORDER — LIDOCAINE HCL (CARDIAC) 10 MG/ML IV SOLN
INTRAVENOUS | Status: DC | PRN
  Administered 2013-02-02: 25 mg via INTRAVENOUS
  Administered 2013-02-02: 50 mg via INTRAVENOUS

## 2013-02-02 MED ORDER — FENTANYL CITRATE 0.05 MG/ML IJ SOLN
INTRAMUSCULAR | Status: AC
  Filled 2013-02-02: qty 5

## 2013-02-02 MED ORDER — PROPOFOL 10 MG/ML IV BOLUS
INTRAVENOUS | Status: DC | PRN
  Administered 2013-02-02: 200 mg via INTRAVENOUS

## 2013-02-02 MED ORDER — LIDOCAINE HCL (CARDIAC) 20 MG/ML IV SOLN
INTRAVENOUS | Status: AC
  Filled 2013-02-02: qty 5

## 2013-02-02 MED ORDER — LACTATED RINGERS IV SOLN
INTRAVENOUS | Status: DC | PRN
  Administered 2013-02-02: 16:00:00 via INTRAVENOUS

## 2013-02-02 MED ORDER — SODIUM CHLORIDE 0.9 % IR SOLN
Status: DC | PRN
  Administered 2013-02-02 (×2): 3000 mL

## 2013-02-02 SURGICAL SUPPLY — 30 items
BAG URO CATCHER STRL LF (DRAPE) IMPLANT
BASKET LASER NITINOL 1.9FR (BASKET) IMPLANT
BASKET STNLS GEMINI 4WIRE 3FR (BASKET) IMPLANT
BASKET ZERO TIP NITINOL 2.4FR (BASKET) IMPLANT
BSKT STON RTRVL 120 1.9FR (BASKET)
BSKT STON RTRVL GEM 120X11 3FR (BASKET)
BSKT STON RTRVL ZERO TP 2.4FR (BASKET)
CATH CLEAR GEL 3F BACKSTOP (CATHETERS) IMPLANT
CATH URET 5FR 28IN CONE TIP (BALLOONS)
CATH URET 5FR 28IN OPEN ENDED (CATHETERS) IMPLANT
CATH URET 5FR 70CM CONE TIP (BALLOONS) IMPLANT
CATH URET DUAL LUMEN 6-10FR 50 (CATHETERS) IMPLANT
CLOTH BEACON ORANGE TIMEOUT ST (SAFETY) ×3 IMPLANT
DRAPE CAMERA CLOSED 9X96 (DRAPES) ×3 IMPLANT
FIBER LASER FLEXIVA 200 (UROLOGICAL SUPPLIES) IMPLANT
FIBER LASER FLEXIVA 365 (UROLOGICAL SUPPLIES) IMPLANT
GLOVE BIOGEL M 7.0 STRL (GLOVE) ×3 IMPLANT
GOWN STRL REUS W/TWL LRG LVL3 (GOWN DISPOSABLE) ×3 IMPLANT
GUIDEWIRE ANG ZIPWIRE 038X150 (WIRE) IMPLANT
GUIDEWIRE STR DUAL SENSOR (WIRE) ×3 IMPLANT
IV NS IRRIG 3000ML ARTHROMATIC (IV SOLUTION) ×3 IMPLANT
PACK CYSTO (CUSTOM PROCEDURE TRAY) ×3 IMPLANT
SCRUB PCMX 4 OZ (MISCELLANEOUS) IMPLANT
SHEATH ACCESS URETERAL 38CM (SHEATH) IMPLANT
SHEATH URET ACCESS 12FR/35CM (UROLOGICAL SUPPLIES) IMPLANT
SHEATH URET ACCESS 12FR/55CM (UROLOGICAL SUPPLIES) IMPLANT
STENT POLARIS LOOP 8FR X 28 CM (STENTS) ×3 IMPLANT
SYRINGE IRR TOOMEY STRL 70CC (SYRINGE) IMPLANT
TUBING CONNECTING 10 (TUBING) ×2 IMPLANT
TUBING CONNECTING 10' (TUBING) ×1

## 2013-02-02 NOTE — Discharge Instructions (Signed)
DISCHARGE INSTRUCTIONS FOR KIDNEY STONES OR URETERAL STENT ° °MEDICATIONS:  ° °1.  Resume all your other meds from home. ° °ACTIVITY °1. No strenuous activity x 1week °2. No driving while on narcotic pain medications °3. Drink plenty of water °4. Continue to walk at home - you can still get blood clots when you are at home, so keep active, but don't over do it. °5. May return to work in 3 days. ° °BATHING °1. You can shower or take a bath. ° ° ° °SIGNS/SYMPTOMS TO CALL: °1. Please call us if you have a fever greater than 101.5, uncontrolled  °nausea/vomiting, uncontrolled pain, dizziness, unable to urinate, chest pain, shortness of breath, leg swelling, leg pain, redness around wound, drainage from wound, or any other concerns or questions. ° °You can reach us at [phone]. ° ° °

## 2013-02-02 NOTE — Anesthesia Preprocedure Evaluation (Addendum)
Anesthesia Evaluation  Patient identified by MRN, date of birth, ID band Patient awake    Reviewed: Allergy & Precautions, H&P , NPO status , Patient's Chart, lab work & pertinent test results  Airway Mallampati: II TM Distance: >3 FB Neck ROM: Full    Dental no notable dental hx.    Pulmonary neg pulmonary ROS, former smoker,  breath sounds clear to auscultation  Pulmonary exam normal       Cardiovascular hypertension, Pt. on medications and Pt. on home beta blockers Rhythm:Regular Rate:Normal     Neuro/Psych negative neurological ROS  negative psych ROS   GI/Hepatic negative GI ROS, Neg liver ROS,   Endo/Other  negative endocrine ROS  Renal/GU negative Renal ROS  negative genitourinary   Musculoskeletal negative musculoskeletal ROS (+)   Abdominal   Peds negative pediatric ROS (+)  Hematology negative hematology ROS (+)   Anesthesia Other Findings   Reproductive/Obstetrics negative OB ROS                         Anesthesia Physical Anesthesia Plan  ASA: II  Anesthesia Plan: General   Post-op Pain Management:    Induction: Intravenous  Airway Management Planned: Oral ETT  Additional Equipment:   Intra-op Plan:   Post-operative Plan:   Informed Consent: I have reviewed the patients History and Physical, chart, labs and discussed the procedure including the risks, benefits and alternatives for the proposed anesthesia with the patient or authorized representative who has indicated his/her understanding and acceptance.   Dental advisory given  Plan Discussed with: CRNA and Surgeon  Anesthesia Plan Comments: (Solids at 0700)       Anesthesia Quick Evaluation

## 2013-02-02 NOTE — Op Note (Signed)
Urology Operative Report  Date of Procedure: 02/02/13  Surgeon: Rema Jasmine, MD Assistant:  None  Preoperative Diagnosis: Right nephrolithiasis. Right ureter stone. Postoperative Diagnosis: Right hydronephrosis. Right distal ureter stenosis. Right nephrolithiasis.  Procedure(s): Cystoscopy. Right retrograde pyelogram with interpretation. Right ureter stent placement (8 x 28 polaris)  Estimated blood loss: None  Specimen: None  Drains: None  Complications: None  Findings: This patient had stenosis of his right distal ureter. He also had right proximal hydroureter and right hydronephrosis.  History of present illness: Patient presented to clinic today with symptomatic right nephrolithiasis and a history of a recent right ureter stone. He presents to the operating room for right ureter stent placement.   Procedure in detail: After informed consent was obtained, the patient was taken to the operating room. They were placed in the supine position. SCDs were turned on and in place. IV antibiotics were infused, and general anesthesia was induced. A timeout was performed in which the correct patient, surgical site, and procedure were identified and agreed upon by the team.  The patient was placed in a dorsolithotomy position, making sure to pad all pertinent neurovascular pressure points. The genitals were prepped and draped in the usual sterile fashion.  A rigid cystoscope was made through the urethra and into the bladder. The bladder was drained. It was then fully distended and evaluated in a systematic fashion with a 12 and 70 lens to evaluate the entire surface of the bladder. This was negative for bladder tumors. Attention was turned to the right ureter orifice. It was noted to be stenotic. It was cannulated with a 5 [place] ureter catheter and I injected 10 cc of Omnipaque to obtain a retrograde pyelogram. There was noted to be a short area of stenosis proximally once the meter  in length followed by hydroureter and right hydronephrosis. There is a possible filling defect in the right renal pelvis consistent with a right renal stone. This did not empty out promptly likely due to the stenosis of the right distal ureter.  I then placed a sensor wire up the right ureter orifice into the right renal pelvis on fluoroscopy. Because of the stenosis of the ureter and the plan for future ureteroscopy elected to leave a larger stent in place. An 8 x 28 Polaris stent without tethering strings was placed over the wire. This was initially more difficult placed through the stenotic area but was placed with ease once this was traversed. His was deployed with a good curl in the right upper pole calyx and the loops of the stent within the bladder.  The bladder was then drained, I placed 10 cc of lidocaine jelly into the urethra, and placed a belladonna and opium suppository into the rectum. Prostate was 35-40 g in size without nodules.  This patient has pain medication at home. He will follow up for interval ureteroscopy as discussed. I do not feel shockwave lithotripsy would be appropriate given the fact that he has such severe right distal ureter stenosis I do not feel that stones or stone fragments would pass this area. He might require ureter dilation with future ureteroscopy, but we will see how much passive dilation we get out of the stent.  All counts were correct at the end of the case.

## 2013-02-02 NOTE — Anesthesia Postprocedure Evaluation (Signed)
  Anesthesia Post-op Note  Patient: David Cantrell  Procedure(s) Performed: Procedure(s) (LRB): CYSTOSCOPY WITH RETROGRADE PYELOGRAM, AND Right STENT PLACEMENT (Right)  Patient Location: PACU  Anesthesia Type: General  Level of Consciousness: awake and alert   Airway and Oxygen Therapy: Patient Spontanous Breathing  Post-op Pain: mild  Post-op Assessment: Post-op Vital signs reviewed, Patient's Cardiovascular Status Stable, Respiratory Function Stable, Patent Airway and No signs of Nausea or vomiting  Last Vitals:  Filed Vitals:   02/02/13 1715  BP: 140/78  Pulse: 64  Temp:   Resp: 15    Post-op Vital Signs: stable   Complications: No apparent anesthesia complications

## 2013-02-02 NOTE — Transfer of Care (Signed)
Immediate Anesthesia Transfer of Care Note  Patient: David Cantrell  Procedure(s) Performed: Procedure(s): CYSTOSCOPY WITH RETROGRADE PYELOGRAM, AND Right STENT PLACEMENT (Right)  Patient Location: PACU  Anesthesia Type:General  Level of Consciousness: awake, alert , oriented and patient cooperative  Airway & Oxygen Therapy: Patient Spontanous Breathing and Patient connected to face mask oxygen  Post-op Assessment: Report given to PACU RN, Post -op Vital signs reviewed and stable and Patient moving all extremities X 4  Post vital signs: stable  Complications: No apparent anesthesia complications

## 2013-02-02 NOTE — H&P (Signed)
Urology History and Physical Exam  CC: Right nephrolithiasis. Right ureter stone.  HPI:  This 73 year old male returns today with several problems.  #1 right ureter stone.  This was in the distal right ureter.  It was 2 mm in size.  Seen on CT scan, but follow-up x-ray was difficult to tell whether the stone was visible due to the fact of he had multiple pelvic phleboliths. This patient was seen by my nurse practitioner 01/30/13.  He had worsening of his pain.  He had a KUB and a right renal ultrasound that day.  The right renal ultrasound showed some mild hydronephrosis.  A KUB did not definitely reveal a right distal stone as seen on CT scan, but I do not have the images of the CT scan.  There are also several pelvic calcifications which could represent a right distal stone.  It appears that the right kidney stone has shifted more medially and could be at the ureteropelvic junction.  This could be the cause for his discomfort.  He continues to have pain.  He has not been taking his narcotic as it keeps him awake.  The pain now radiates to his right groin.  #2 nephrolithiasis.  There located in the right side.  The stone is approximately 0.9 cm in size.  It appeared more lateral in the mid pole on previous KUB.  Most recent KUB 01/30/13 revealed the stone was more medial in nature.  This could be causing obstruction of the ureteropelvic junction.  Right-sided ultrasound that same day revealed some hydronephrosis.  This is not associated with fever.  He has had mild gross hematuria.  Nothing makes it better or worse.  This could be the cause of his flank pain as well.  We discussed management options as listed below.  I'm concerned that if we proceeded with shockwave lithotripsy that this would be successful for his proximal stone, but it is right distal stone is still obstructing and this could cause Steinstr[place].  Because of this I recommended ureteroscopy.  He has a history of heart disease.  Because of  this he would need clearance from his cardiologist before proceeding with ureteroscopy.  Given his acute symptoms I feel that a stent is most appropriate today.  This is urgent and therefore I will not obtain cardiac clearance for the stent.  Today we discussed the management of urinary stones. These options include observation, ureteroscopy, shockwave lithotripsy, and PCNL. We discussed which options are relevant to these particular stones. We discussed the natural history of stones as well as the complications of untreated stones and the impact on quality of life without treatment as well as with each of the above listed treatments. We also discussed the efficacy of each treatment in its ability to clear the stone burden. With any of these management options I discussed the signs and symptoms of infection and the need for emergent treatment should these be experienced. For each option we discussed the ability of each procedure to clear the patient of their stone burden. We also discussed risks, benefits, alternatives, and likelihood of achieving goals.  For ureteroscopy I described the risks which include heart attack, stroke, pulmonary embolus, death, bleeding, infection, damage to contiguous structures, positioning injury, ureteral stricture, ureteral avulsion, ureteral injury, need for ureteral stent, inability to perform ureteroscopy, need for an interval procedure, inability to clear stone burden, stent discomfort and pain. The risks for cystoscopy and stent placement are similar to ureteroscopy. We discussed specific risks of stent  placement including but not limited to inability to place stent and stent discomfort/pain.  PMH: Past Medical History  Diagnosis Date  . CAD (coronary artery disease)   . Exercise induced bronchospasm     no childhood asthma  . Other and unspecified hyperlipidemia   . Hypertension   . Allergy     seasonal allergies    PSH: Past Surgical History  Procedure  Laterality Date  . Coronary catherization      Dr Wynonia Lawman; X 2 . No stents or CBAG  . Esi      Dr Nelva Bush; for cervical radiculopathy  . Colonoscopy with polypectomy      X 5; Dr Sharlett Iles  . Shoulder surgery      open procedure x2; Dr Collie Siad & Dr Shellia Carwin  . Shoulder arthroscopy      L ; Dr Theda Sers  . Colonoscopy    . Polypectomy      Allergies: Allergies  Allergen Reactions  . Sulfonamide Derivatives     urticaria  . Codeine     constipation    Medications: No prescriptions prior to admission     Social History: History   Social History  . Marital Status: Married    Spouse Name: N/A    Number of Children: N/A  . Years of Education: N/A   Occupational History  . Not on file.   Social History Main Topics  . Smoking status: Former Smoker    Quit date: 01/12/1970  . Smokeless tobacco: Never Used     Comment: smoked 1964-72  . Alcohol Use: No  . Drug Use: No  . Sexual Activity: Not on file   Other Topics Concern  . Not on file   Social History Narrative  . No narrative on file    Family History: Family History  Problem Relation Age of Onset  . Stroke Father     in 52s  . Colon cancer Father     upper 62's  . Heart attack Brother 39    stent  . Cancer Brother     pre cancerous bladder lesion  . Diabetes Paternal Uncle   . Arthritis Neg Hx   . Asthma Neg Hx   . COPD Neg Hx   . Rectal cancer Neg Hx   . Stomach cancer Neg Hx     Review of Systems: Positive: Chills, flank pain. Negative: Fever, SOB, Chest pain.  A further 10 point review of systems was negative except what is listed in the HPI.  Physical Exam: Filed Vitals:   02/02/13 1410  BP: 132/90  Pulse: 73  Temp: 97.5 F (36.4 C)  Resp: 18    General: No acute distress.  Awake. Head:  Normocephalic.  Atraumatic. ENT:  EOMI.  Mucous membranes moist Neck:  Supple.  No lymphadenopathy. CV:  S1 present. S2 present. Regular rate. Pulmonary: Equal effort bilaterally.  Clear to  auscultation bilaterally. Abdomen: Soft.  Non- tender to palpation. Skin:  Normal turgor.  No visible rash. Extremity: No gross deformity of bilateral upper extremities.  No gross deformity of    bilateral lower extremities. Neurologic: Alert. Appropriate mood.    Studies:  No results found for this basename: HGB, WBC, PLT,  in the last 72 hours  No results found for this basename: NA, K, CL, CO2, BUN, CREATININE, CALCIUM, MAGNESIUM, GFRNONAA, GFRAA,  in the last 72 hours   No results found for this basename: PT, INR, APTT,  in the last 72 hours   No  components found with this basename: ABG,     Assessment:  Right nephrolithiasis. Right ureter stone.  Plan: To OR for cystoscopy, right retrograde pyelogram, right ureter stent placement.

## 2013-02-03 ENCOUNTER — Encounter (HOSPITAL_COMMUNITY): Payer: Self-pay | Admitting: Urology

## 2013-02-06 ENCOUNTER — Encounter: Payer: Self-pay | Admitting: Cardiology

## 2013-02-06 NOTE — Progress Notes (Unsigned)
Patient ID: David Cantrell, male   DOB: [birth year], 73 y.o.   MRN: [id number]   Cantrell, David T  Date of visit:  02/06/2013 DOB:  Dec 16, 1940    Age:  73 yrs. Medical record number:  [id number]     Account number:  [id number] Primary Care Provider: Unice Cobble F ____________________________ CURRENT DIAGNOSES  1. CAD,Native  2. Pre-Op Cardiovascular Exam  3. Hypertension-Essential (Benign)  4. Hyperlipidemia  5. Angina Pectoris  6. Obesity(BMI30-40)  7. Dyspnea  8. Long Term Use of Other Medicine ____________________________ ALLERGIES  Atorvastatin, Muscle aches  Codeine, Intolerance-diarrhea  Niacin, Flushing  Ramipril, Intolerance-cough  Sulfa (Sulfonamides), Rash ____________________________ MEDICATIONS  1. Fish Oil 300-1,000 mg capsule, BID  2. garlic 1 mg capsule, 1 p.o. daily  3. Flaxseed Oil 1,000 mg Capsule, BID  4. Multivitamin Tablet, 1 p.o. daily  5. Ventolin HFA 90 mcg/Actuation HFA Aerosol Inhaler, PRN  6. ProAir HFA 90 mcg/Actuation HFA Aerosol Inhaler, PRN  7. metoprolol tartrate 25 mg Tablet, 1/2 tab b.i.d.  8. Vitamin B-12 1,000 mcg Tablet, BID  9. Tylenol PM Extra Strength 25-500 mg Tablet, QHS  10. coQ10 (ubiquinol) 200 mg Capsule, 1 p.o. daily  11. aspirin 81 mg Tablet, Chewable, 1 p.o. q.d.  12. nitroglycerin 0.4 mg tablet, sublingual, PRN  13. fenofibrate 160 mg tablet, 1 p.o. daily  14. Crestor 10 mg tablet, 2 x week  15. senna 8.6 mg tablet, QHS  16. phenazopyridine 100 mg tablet, PRN  17. alfuzosin ER 10 mg tablet,extended release 24 hr, QHS  18. cephalexin 500 mg capsule, TID  19. oxybutynin chloride 5 mg tablet, PRN  20. Simbrinza 1 %-0.2 % eye drops,suspension, BID ____________________________ CHIEF COMPLAINTS  To have kidney stone surg ____________________________ HISTORY OF PRESENT ILLNESS  Patient seen for preoperative cardiac evaluation. The patient has developed kidney stones and is in need of significant urologic surgery. He has a  history of coronary artery disease and has very minimal angina. He has disease in the distal circumflex that has been treated medically. He has reasonable exercise tolerance but has gained weight. He denies PND, orthopnea, syncope, or palpitations. ____________________________ PAST HISTORY  Past Medical Illnesses:  hypertension, hyperlipidemia, cervical disc, obesity;  Cardiovascular Illnesses:  CAD;  Surgical Procedures:  hemorrhoidectomy, shoulder surgery-bil;  Cardiology Procedures-Invasive:  cardiac cath (left) May 2012;  Cardiology Procedures-Noninvasive:  treadmill cardiolite September 2008, treadmill cardiolite May 2012;  Cardiac Cath Results:  normal Left main, 40% stenosis proximal mid LAD, dominant CFX, 70% stenosis distal CFX, 80% stenosis proximal PDA, small and nondominant RCA;  LVEF of 57% documented via nuclear study on 05/29/2010,   ____________________________ CARDIO-PULMONARY TEST DATES EKG Date:  02/06/2013;   Cardiac Cath Date:  06/05/2010;  Nuclear Study Date:  05/29/2010;  Chest Xray Date: 06/02/2010;   ____________________________ FAMILY HISTORY Brother -- Brother alive and well Brother -- Coronary Artery Disease Brother -- Brother alive and well Brother -- Brother alive and well Brother -- Coronary Artery Disease Father -- Father dead, Coronary Artery Disease, CVA, Deceased Mother -- Mother dead, Death of unknown cause, Deceased Sister -- Sister alive and well ____________________________ SOCIAL HISTORY Alcohol Use:  no alcohol use;  Smoking:  used to smoke but quit Prior to 1980;  Diet:  [employer];  Lifestyle:  divorced, remarried and 13 years;  Exercise:  weight lifting;  Occupation:  retired, worked at [employer];  Residence:  lives with wife;   ____________________________ REVIEW OF SYSTEMS General:  weight gain of approximately 10 lbs  Ears, Nose, Throat, Mouth:  tinnitus, recent sinusitis Respiratory: denies dyspnea, cough, wheezing or hemoptysis.  Cardiovascular:  please review HPI Abdominal: hemmorroids Genitourinary-Male: erectile dysfunction  Musculoskeletal:  cervical spondylosis  ____________________________ PHYSICAL EXAMINATION VITAL SIGNS  Blood Pressure:  104/60 Sitting, Right arm, regular cuff  , 110/62 Standing, Right arm and regular cuff   Pulse:  100/min. Weight:  243.00 lbs. Height:  73"BMI: 32  Constitutional:  pleasant white male in no acute distress Skin:  warm and dry to touch, no apparent skin lesions, or masses noted. Head:  normocephalic, balding male hair pattern ENT:  ears, nose and throat reveal no gross abnormalities.  Dentition good. Neck:  supple, without massess. No JVD, thyromegaly or carotid bruits. Carotid upstroke normal. Chest:  normal symmetry, clear to auscultation and percussion. Cardiac:  regular rhythm, normal S1 and S2, No S3 or S4, no murmurs, gallops or rubs detected. Abdomen:  abdomen soft,non-tender, no masses, no hepatospenomegaly, or aneurysm noted Peripheral Pulses:  the femoral,dorsalis pedis, and posterior tibial pulses are full and equal bilaterally with no bruits auscultated. Extremities & Back:  no deformities, clubbing, cyanosis, erythema or edema observed. Normal muscle strength and tone. Neurological:  no gross motor or sensory deficits noted, affect appropriate, oriented x3. ____________________________ MOST RECENT LIPID PANEL 07/25/12  CHOL TOTL 199 mg/dl, LDL 117 calc, HDL 37 mg/dl, TRIGLYCER 223 mg/dl and CHOL/HDL 5.4 (Calc) ____________________________ IMPRESSIONS/PLAN  1. May proceed with planned urologic surgery from a cardiovascular viewpoint. He should continue his usual cardiac medications during the perioperative period. His surgical risks should be average. 2. Coronary artery disease with disease involving the distal branches of a dominant circumflex system 3. Hyperlipidemia under treatment 4. Hypertension 5. Obesity with weight gain since  here  Recommendations:  May proceed with surgery from a cardiovascular viewpoint. We discussed the importance of weight loss. I would continue his aspirin during the surgery. Please let me know if you need from me to be involved in the perioperative time. 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 ____________________________ TODAYS ORDERS  1. Return Visit: keep appt  2. 12 Lead EKG: Today                       ____________________________ Cardiology Physician:  Kerry Hough MD [facility]

## 2013-02-07 ENCOUNTER — Other Ambulatory Visit: Payer: Self-pay | Admitting: Urology

## 2013-02-09 ENCOUNTER — Encounter: Payer: Self-pay | Admitting: Internal Medicine

## 2013-02-09 ENCOUNTER — Encounter (HOSPITAL_BASED_OUTPATIENT_CLINIC_OR_DEPARTMENT_OTHER): Payer: Self-pay | Admitting: *Deleted

## 2013-02-09 DIAGNOSIS — Z9189 Other specified personal risk factors, not elsewhere classified: Secondary | ICD-10-CM | POA: Insufficient documentation

## 2013-02-09 NOTE — Progress Notes (Signed)
NPO AFTER MN. ARRIVE AT 1000. NEEDS ISTAT. CURRENT EKG IN EPIC AND CHART. WILL TAKE LOPRESSOR AM DOS W/ SIPS OF WATER AND IF NEEDED MAY TAKE OXYCODONE.

## 2013-02-09 NOTE — Progress Notes (Signed)
02/09/13 1302  OBSTRUCTIVE SLEEP APNEA  Have you ever been diagnosed with sleep apnea through a sleep study? No  Do you snore loudly (loud enough to be heard through closed doors)?  1  Do you often feel tired, fatigued, or sleepy during the daytime? 0  Has anyone observed you stop breathing during your sleep? 0  Do you have, or are you being treated for high blood pressure? 0  BMI more than 35 kg/m2? 0  Age over 72 years old? 1  Neck circumference greater than 40 cm/18 inches? 1  Gender: 1  Obstructive Sleep Apnea Score 4  Score 4 or greater  Results sent to PCP

## 2013-02-15 ENCOUNTER — Ambulatory Visit (HOSPITAL_BASED_OUTPATIENT_CLINIC_OR_DEPARTMENT_OTHER): Payer: Medicare HMO | Admitting: Anesthesiology

## 2013-02-15 ENCOUNTER — Ambulatory Visit (HOSPITAL_BASED_OUTPATIENT_CLINIC_OR_DEPARTMENT_OTHER)
Admission: RE | Admit: 2013-02-15 | Discharge: 2013-02-15 | Disposition: A | Discharge status: Home / Self Care | Payer: Medicare HMO | Source: Ambulatory Visit | Attending: Urology | Admitting: Urology

## 2013-02-15 ENCOUNTER — Encounter (HOSPITAL_BASED_OUTPATIENT_CLINIC_OR_DEPARTMENT_OTHER): Payer: Medicare HMO | Admitting: Anesthesiology

## 2013-02-15 ENCOUNTER — Encounter (HOSPITAL_BASED_OUTPATIENT_CLINIC_OR_DEPARTMENT_OTHER)
Admission: RE | Disposition: A | Discharge status: Home / Self Care | Payer: Self-pay | Source: Ambulatory Visit | Attending: Urology

## 2013-02-15 ENCOUNTER — Encounter (HOSPITAL_BASED_OUTPATIENT_CLINIC_OR_DEPARTMENT_OTHER): Payer: Self-pay

## 2013-02-15 DIAGNOSIS — I2510 Atherosclerotic heart disease of native coronary artery without angina pectoris: Secondary | ICD-10-CM | POA: Insufficient documentation

## 2013-02-15 DIAGNOSIS — H4010X0 Unspecified open-angle glaucoma, stage unspecified: Secondary | ICD-10-CM | POA: Insufficient documentation

## 2013-02-15 DIAGNOSIS — J4489 Other specified chronic obstructive pulmonary disease: Secondary | ICD-10-CM | POA: Insufficient documentation

## 2013-02-15 DIAGNOSIS — N200 Calculus of kidney: Secondary | ICD-10-CM

## 2013-02-15 DIAGNOSIS — Z860100 Personal history of colon polyps, unspecified: Secondary | ICD-10-CM | POA: Insufficient documentation

## 2013-02-15 DIAGNOSIS — I10 Essential (primary) hypertension: Secondary | ICD-10-CM | POA: Insufficient documentation

## 2013-02-15 DIAGNOSIS — Z888 Allergy status to other drugs, medicaments and biological substances status: Secondary | ICD-10-CM | POA: Insufficient documentation

## 2013-02-15 DIAGNOSIS — Z882 Allergy status to sulfonamides status: Secondary | ICD-10-CM | POA: Insufficient documentation

## 2013-02-15 DIAGNOSIS — E785 Hyperlipidemia, unspecified: Secondary | ICD-10-CM | POA: Insufficient documentation

## 2013-02-15 DIAGNOSIS — Z885 Allergy status to narcotic agent status: Secondary | ICD-10-CM | POA: Insufficient documentation

## 2013-02-15 DIAGNOSIS — I440 Atrioventricular block, first degree: Secondary | ICD-10-CM | POA: Insufficient documentation

## 2013-02-15 DIAGNOSIS — N135 Crossing vessel and stricture of ureter without hydronephrosis: Secondary | ICD-10-CM | POA: Insufficient documentation

## 2013-02-15 DIAGNOSIS — J301 Allergic rhinitis due to pollen: Secondary | ICD-10-CM | POA: Insufficient documentation

## 2013-02-15 DIAGNOSIS — J449 Chronic obstructive pulmonary disease, unspecified: Secondary | ICD-10-CM | POA: Insufficient documentation

## 2013-02-15 DIAGNOSIS — N201 Calculus of ureter: Secondary | ICD-10-CM | POA: Insufficient documentation

## 2013-02-15 HISTORY — DX: Atrioventricular block, first degree: I44.0

## 2013-02-15 HISTORY — DX: Other seasonal allergic rhinitis: J30.2

## 2013-02-15 HISTORY — DX: Unspecified open-angle glaucoma, stage unspecified: H40.10X0

## 2013-02-15 HISTORY — PX: HOLMIUM LASER APPLICATION: SHX5852

## 2013-02-15 HISTORY — DX: Chronic obstructive pulmonary disease, unspecified: J44.9

## 2013-02-15 HISTORY — DX: Personal history of colonic polyps: Z86.010

## 2013-02-15 HISTORY — DX: Hyperlipidemia, unspecified: E78.5

## 2013-02-15 HISTORY — PX: CYSTOSCOPY WITH RETROGRADE PYELOGRAM, URETEROSCOPY AND STENT PLACEMENT: SHX5789

## 2013-02-15 HISTORY — DX: Personal history of adenomatous and serrated colon polyps: Z86.0101

## 2013-02-15 LAB — POCT I-STAT 4, (NA,K, GLUC, HGB,HCT)
GLUCOSE: 111 mg/dL — AB (ref 70–99)
HCT: 46 % (ref 39.0–52.0)
HEMOGLOBIN: 15.6 g/dL (ref 13.0–17.0)
POTASSIUM: 4.1 meq/L (ref 3.7–5.3)
SODIUM: 141 meq/L (ref 137–147)

## 2013-02-15 SURGERY — CYSTOURETEROSCOPY, WITH RETROGRADE PYELOGRAM AND STENT INSERTION
Anesthesia: General | Site: Ureter | Laterality: Right

## 2013-02-15 MED ORDER — PHENAZOPYRIDINE HCL 100 MG PO TABS: 100.0000 mg | ORAL_TABLET | Freq: Three times a day (TID) | ORAL | Status: DC | PRN

## 2013-02-15 MED ORDER — HYDROMORPHONE HCL PF 1 MG/ML IJ SOLN
INTRAMUSCULAR | Status: AC
  Filled 2013-02-15: qty 1

## 2013-02-15 MED ORDER — MIDAZOLAM HCL 2 MG/2ML IJ SOLN
INTRAMUSCULAR | Status: AC
  Filled 2013-02-15: qty 2

## 2013-02-15 MED ORDER — PROMETHAZINE HCL 25 MG/ML IJ SOLN
6.2500 mg | INTRAMUSCULAR | Status: DC | PRN
  Filled 2013-02-15: qty 1

## 2013-02-15 MED ORDER — CEFAZOLIN SODIUM-DEXTROSE 2-3 GM-% IV SOLR
2.0000 g | INTRAVENOUS | Status: AC
  Administered 2013-02-15: 2 g via INTRAVENOUS
  Filled 2013-02-15: qty 50

## 2013-02-15 MED ORDER — LACTATED RINGERS IV SOLN
INTRAVENOUS | Status: DC
  Administered 2013-02-15: 10:00:00 via INTRAVENOUS
  Filled 2013-02-15: qty 1000

## 2013-02-15 MED ORDER — BELLADONNA ALKALOIDS-OPIUM 16.2-60 MG RE SUPP
RECTAL | Status: AC
  Filled 2013-02-15: qty 1

## 2013-02-15 MED ORDER — LACTATED RINGERS IV SOLN
INTRAVENOUS | Status: DC
  Administered 2013-02-15: 13:00:00 via INTRAVENOUS
  Filled 2013-02-15: qty 1000

## 2013-02-15 MED ORDER — PHENAZOPYRIDINE HCL 100 MG PO TABS
100.0000 mg | ORAL_TABLET | Freq: Three times a day (TID) | ORAL | Status: DC
  Administered 2013-02-15: 100 mg via ORAL
  Filled 2013-02-15: qty 1

## 2013-02-15 MED ORDER — SODIUM CHLORIDE 0.9 % IR SOLN
Status: DC | PRN
  Administered 2013-02-15: 3000 mL

## 2013-02-15 MED ORDER — OXYBUTYNIN CHLORIDE 5 MG PO TABS: 5.0000 mg | ORAL_TABLET | Freq: Four times a day (QID) | ORAL | Status: DC | PRN

## 2013-02-15 MED ORDER — CEPHALEXIN 500 MG PO CAPS: 500.0000 mg | ORAL_CAPSULE | Freq: Three times a day (TID) | ORAL | Status: DC

## 2013-02-15 MED ORDER — PROPOFOL 10 MG/ML IV BOLUS
INTRAVENOUS | Status: DC | PRN
  Administered 2013-02-15: 180 mg via INTRAVENOUS

## 2013-02-15 MED ORDER — LIDOCAINE HCL (CARDIAC) 20 MG/ML IV SOLN
INTRAVENOUS | Status: DC | PRN
  Administered 2013-02-15: 60 mg via INTRAVENOUS

## 2013-02-15 MED ORDER — DEXAMETHASONE SODIUM PHOSPHATE 4 MG/ML IJ SOLN
INTRAMUSCULAR | Status: DC | PRN
  Administered 2013-02-15: 10 mg via INTRAVENOUS

## 2013-02-15 MED ORDER — OXYCODONE-ACETAMINOPHEN 5-325 MG PO TABS
ORAL_TABLET | ORAL | Status: AC
  Filled 2013-02-15: qty 1

## 2013-02-15 MED ORDER — MIDAZOLAM HCL 5 MG/5ML IJ SOLN
INTRAMUSCULAR | Status: DC | PRN
  Administered 2013-02-15: 1 mg via INTRAVENOUS

## 2013-02-15 MED ORDER — OXYCODONE-ACETAMINOPHEN 5-325 MG PO TABS
1.0000 | ORAL_TABLET | ORAL | Status: DC | PRN
  Administered 2013-02-15: 1 via ORAL
  Filled 2013-02-15: qty 2

## 2013-02-15 MED ORDER — FENTANYL CITRATE 0.05 MG/ML IJ SOLN
INTRAMUSCULAR | Status: AC
  Filled 2013-02-15: qty 6

## 2013-02-15 MED ORDER — BELLADONNA ALKALOIDS-OPIUM 16.2-60 MG RE SUPP
RECTAL | Status: DC | PRN
  Administered 2013-02-15: 1 via RECTAL

## 2013-02-15 MED ORDER — HYOSCYAMINE SULFATE 0.125 MG PO TABS: 0.1250 mg | ORAL_TABLET | ORAL | Status: DC | PRN

## 2013-02-15 MED ORDER — ONDANSETRON HCL 4 MG/2ML IJ SOLN
INTRAMUSCULAR | Status: DC | PRN
  Administered 2013-02-15: 4 mg via INTRAVENOUS

## 2013-02-15 MED ORDER — PHENAZOPYRIDINE HCL 100 MG PO TABS
ORAL_TABLET | ORAL | Status: AC
  Filled 2013-02-15: qty 1

## 2013-02-15 MED ORDER — KETOROLAC TROMETHAMINE 30 MG/ML IJ SOLN
INTRAMUSCULAR | Status: DC | PRN
  Administered 2013-02-15: 30 mg via INTRAVENOUS

## 2013-02-15 MED ORDER — HYDROMORPHONE HCL PF 1 MG/ML IJ SOLN
0.2500 mg | INTRAMUSCULAR | Status: DC | PRN
  Administered 2013-02-15: 0.5 mg via INTRAVENOUS
  Filled 2013-02-15: qty 1

## 2013-02-15 MED ORDER — OXYCODONE-ACETAMINOPHEN 5-325 MG PO TABS: 1.0000 | ORAL_TABLET | ORAL | Status: DC | PRN

## 2013-02-15 MED ORDER — IOHEXOL 350 MG/ML SOLN
INTRAVENOUS | Status: DC | PRN
  Administered 2013-02-15: 13 mL

## 2013-02-15 MED ORDER — FENTANYL CITRATE 0.05 MG/ML IJ SOLN
INTRAMUSCULAR | Status: DC | PRN
  Administered 2013-02-15: 25 ug via INTRAVENOUS
  Administered 2013-02-15: 50 ug via INTRAVENOUS
  Administered 2013-02-15: 25 ug via INTRAVENOUS

## 2013-02-15 MED ORDER — LIDOCAINE HCL 2 % EX GEL
CUTANEOUS | Status: DC | PRN
  Administered 2013-02-15: 1 via URETHRAL

## 2013-02-15 SURGICAL SUPPLY — 51 items
BAG DRAIN URO-CYSTO SKYTR STRL (DRAIN) ×3 IMPLANT
BAG DRN UROCATH (DRAIN) ×1
BASKET LASER NITINOL 1.9FR (BASKET) IMPLANT
BASKET STNLS GEMINI 4WIRE 3FR (BASKET) IMPLANT
BASKET ZERO TIP NITINOL 2.4FR (BASKET) IMPLANT
BRUSH URET BIOPSY 3F (UROLOGICAL SUPPLIES) IMPLANT
BSKT STON RTRVL 120 1.9FR (BASKET)
BSKT STON RTRVL GEM 120X11 3FR (BASKET)
BSKT STON RTRVL ZERO TP 2.4FR (BASKET)
CANISTER SUCT LVC 12 LTR MEDI- (MISCELLANEOUS) ×3 IMPLANT
CATH CLEAR GEL 3F BACKSTOP (CATHETERS) IMPLANT
CATH INTERMIT  6FR 70CM (CATHETERS) IMPLANT
CATH URET 5FR 28IN CONE TIP (BALLOONS)
CATH URET 5FR 28IN OPEN ENDED (CATHETERS) ×3 IMPLANT
CATH URET 5FR 70CM CONE TIP (BALLOONS) IMPLANT
CATH URET DUAL LUMEN 6-10FR 50 (CATHETERS) IMPLANT
CLOTH BEACON ORANGE TIMEOUT ST (SAFETY) ×3 IMPLANT
DRAPE CAMERA CLOSED 9X96 (DRAPES) ×3 IMPLANT
ELECT REM PT RETURN 9FT ADLT (ELECTROSURGICAL)
ELECTRODE REM PT RTRN 9FT ADLT (ELECTROSURGICAL) IMPLANT
EXTRACTOR STONE NITINOL NGAGE (UROLOGICAL SUPPLIES) ×3 IMPLANT
FIBER LASER FLEXIVA 200 (UROLOGICAL SUPPLIES) IMPLANT
FIBER LASER FLEXIVA 365 (UROLOGICAL SUPPLIES) IMPLANT
FIBER LASER TRAC TIP (UROLOGICAL SUPPLIES) ×3 IMPLANT
GLOVE BIO SURGEON STRL SZ7 (GLOVE) ×3 IMPLANT
GLOVE BIOGEL M 6.5 STRL (GLOVE) ×3 IMPLANT
GLOVE BIOGEL PI IND STRL 6.5 (GLOVE) ×2 IMPLANT
GLOVE BIOGEL PI INDICATOR 6.5 (GLOVE) ×4
GLOVE ECLIPSE 7.0 STRL STRAW (GLOVE) ×3 IMPLANT
GLOVE INDICATOR 7.5 STRL GRN (GLOVE) ×3 IMPLANT
GOWN PREVENTION PLUS LG XLONG (DISPOSABLE) IMPLANT
GOWN STRL REUS W/TWL LRG LVL3 (GOWN DISPOSABLE) ×6 IMPLANT
GUIDEWIRE 0.038 PTFE COATED (WIRE) IMPLANT
GUIDEWIRE ANG ZIPWIRE 038X150 (WIRE) IMPLANT
GUIDEWIRE STR DUAL SENSOR (WIRE) ×6 IMPLANT
IV NS 1000ML (IV SOLUTION) ×9
IV NS 1000ML BAXH (IV SOLUTION) ×3 IMPLANT
IV NS IRRIG 3000ML ARTHROMATIC (IV SOLUTION) IMPLANT
KIT BALLIN UROMAX 15FX10 (LABEL) IMPLANT
KIT BALLN UROMAX 15FX4 (MISCELLANEOUS) IMPLANT
KIT BALLN UROMAX 26 75X4 (MISCELLANEOUS)
PACK CYSTOSCOPY (CUSTOM PROCEDURE TRAY) ×3 IMPLANT
SET HIGH PRES BAL DIL (LABEL)
SHEATH ACCESS URETERAL 38CM (SHEATH) IMPLANT
SHEATH ACCESS URETERAL 54CM (SHEATH) ×3 IMPLANT
SHEATH URET ACCESS 12FR/35CM (UROLOGICAL SUPPLIES) IMPLANT
SHEATH URET ACCESS 12FR/55CM (UROLOGICAL SUPPLIES) IMPLANT
STENT POLARIS LOOP 6FR X 28 CM (STENTS) ×3 IMPLANT
SYRINGE 10CC LL (SYRINGE) ×3 IMPLANT
SYRINGE IRR TOOMEY STRL 70CC (SYRINGE) IMPLANT
WATER STERILE IRR 500ML POUR (IV SOLUTION) ×3 IMPLANT

## 2013-02-15 NOTE — Op Note (Signed)
Urology Operative Report  Date of Procedure: 02/15/13  Surgeon: Rolan Bucco, MD Assistant:  None  Preoperative Diagnosis: Right ureter stone. Right nephrolithiasis. Right ureter stenosis. Postoperative Diagnosis: Right nephrolithiasis  Procedure(s): Right ureteroscopy with laser lithotripsy and stone removal. Right retrograde pyelogram with interpretation. Right ureter stent removal. Right ureter stent placement (6 x 28 Polaris with string). Cystoscopy.  Estimated blood loss: None  Specimen: Stones sent for chemical analysis.  Drains: None  Complications: None  Findings: Negative ureter stones. Ureter stenosis resolved with indwelling stent. Right kidney stone.  History of present illness: Patient presents today for nephrolithiasis. He has a right kidney stone which is proximal 0.9 [place] in size. I believe this is causing symptoms. Earlier in June ureter does 15 he also had a distal right ureter stone which was 2 mm in size and he had stenosis of his right distal ureter noted on stent placement later in January 2015. He presents today for ureteroscopy for the stones.   Procedure in detail: After informed consent was obtained, the patient was taken to the operating room. They were placed in the supine position. SCDs were turned on and in place. IV antibiotics were infused, and general anesthesia was induced. A timeout was performed in which the correct patient, surgical site, and procedure were identified and agreed upon by the team.  The patient was placed in a dorsolithotomy position, making sure to pad all pertinent neurovascular pressure points. A belladonna and opium suppository was placed into the rectum. The genitals were prepped and draped in the usual sterile fashion.  A rigid cystoscope was advanced through the urethra and into the bladder. Attention was turned to the right ureter orifice and the stent was grasped with a stent grasper and brought to the urethra  meatus. A sensor wire was placed through the stent and up into the right renal pelvis on fluoroscopy. The stent was removed and the safety wire was secured to the drape. A second sensor wire was placed up the right ureter and into the right renal pelvis on fluoroscopy. I then placed a 12-14 ureter access sheath over the wire under fluoroscopy by first placing the obturator and then the obturator and sheath. This was placed with ease. The obturator and working wire were then removed.  I then performed right ureteroscopy by placing flexible digital ureter scope through the access sheath, up the ureter, and into the right renal pelvis. I then injected 13 cc of Omnipaque into the renal pelvis to obtain a retrograde pyelogram. This was negative for filling defects. I then systematically evaluated all the calyces and found the larger stone in the inferior pole. I noted multiple Randall's plaques.  I then used a grasper to remove the stone into the upper pole calyx. I then perform lithotripsy with a holmium laser using a 200  filament at settings of 0.5 J and 20 Hz. The stone was fragmented with lithotripsy. The larger fragments were removed with a basket. The remaining stones were too small to remove. I then reevaluated all calyces and found no more large fragments.  I then withdrew the ureter scope and ureter access sheath. There was no injury to the mucosa. There were no ureter stones noted.  Given the remaining smaller fragments I elected to replace a ureter stent. The safety wire was loaded through the cystoscope And a 6 x 28 Polaris stent was placed over the wire under fluoroscopy with ease with a good curl in the right renal pelvis and the loops  in the bladder. The tethering string was left in place and secured to the patient's penis.  The cystoscope was removed, and I placed 10 cc of lidocaine jelly into the urethra.  He was placed in a supine position, anesthesia was reversed, and he was taken to the  [facility] in stable condition.  All counts were correct at the end of the case.  He will be given 6 days of Keflex and instructed to remove his stent on postoperative day 3.

## 2013-02-15 NOTE — H&P (Signed)
Urology History and Physical Exam  CC: Right nephrolithiasis. Right ureter stenosis.  HPI:  73 year old male presents today for nephrolithiasis and a right ureter stone.  He presented to my office on 02/02/13 with flank pain.  He had a right ureter stone.  Elected to proceed with right ureter stent placement which was done that day.  He had a CT scan in January 2015 which showed a 2 mm distal ureter stone.  He also had a right renal pelvis stone.  The stone about 0.9 cm in size.  There were no stones in the left kidney.  On follow-up KUB 01/30/13 it appeared that the renal pelvis stone shifted medially and could be intermittently obstructing the ureter.  As mentioned, he had his ureter stent placed.  It was noted at that time that he had right distal ureter stenosis at the ureter orifice.  He presents today for cystoscopy, right ureteroscopy, laser lithotripsy, right retrograde pyelogram, and right ureter stent exchange.  We discussed the risks, benefits, alternatives, and likelihood of achieving goals.  He is been cleared by Dr. Wynonia Lawman, his cardiologist, as an average surgical risk.  UA 02/02/13 was negative for signs of infection.  PMH: Past Medical History  Diagnosis Date  . Hypertension   . Hyperlipidemia   . CAD (coronary artery disease)     CARDIOLOGIST--  DR Wynonia Lawman  . Seasonal allergies   . Right nephrolithiasis   . Right ureteral stone   . COPD (chronic obstructive pulmonary disease)   . First degree heart block   . History of adenomatous polyp of colon     HIGH GRADE SIGMOID DYSPLASIA  S/P POLYPECTOMY  . Open-angle glaucoma     RIGHT EYE ONLY  . Exercise-induced asthma   . At risk for sleep apnea     STOP-BANG = 4  SENT TO PCP 02-09-2013    PSH: Past Surgical History  Procedure Laterality Date  . Esi      ; for cervical radiculopathy  . Cystoscopy with retrograde pyelogram, ureteroscopy and stent placement Right 02/02/2013    Procedure: CYSTOSCOPY WITH RETROGRADE PYELOGRAM,  AND Right STENT PLACEMENT;  Surgeon: Molli Hazard, MD;  Location: [facility];  Service: Urology;  Laterality: Right;  . Colonoscopy w/ polypectomy  x5  . Hemorrhoid surgery  07-03-1999  . Cardiac catheterization  09-04-2002  DR TILLEY    NORMAL LVF/  MILD TO MODERATE CAD INVOLVING PROXIMAL 40%  AND MID 40%  LAD/  POSTERIOR DESCENDING CX 70%  . Cardiac catheterization  06-05-2010  DR TILEY    NORMAL LM/  40% PROXIMAL MID LAD/ 70% DISTAL CFX/  80% PROXIMAL PDA/  SMALL & NONDOMINANT RCA  . Shoulder open rotator cuff repair Bilateral RIGHT X2  LAST ONE 1997/  LEFT 2005    Allergies: Allergies  Allergen Reactions  . Lipitor [Atorvastatin] Other (See Comments)    MUSCLE ACHES  . Morphine And Related Other (See Comments)    Severe headache  . Niacin And Related Other (See Comments)    FLUSHING  . Ramipril Other (See Comments)    COUGH  . Sulfa Antibiotics Hives  . Codeine Other (See Comments)    CONSTIPATION    Medications: Prescriptions prior to admission  Medication Sig Dispense Refill  . alfuzosin (UROXATRAL) 10 MG 24 hr tablet Take 10 mg by mouth as needed.      Marland Kitchen aspirin EC 81 MG tablet Take 81 mg by mouth daily. PER DR TILLEY DO NOT STOP FOR PROCEDURE      .  Brinzolamide-Brimonidine (SIMBRINZA) 1-0.2 % SUSP Place 1 drop into the right eye 2 (two) times daily.      . Coenzyme Q10 (COQ10) 100 MG CAPS Take 1 capsule by mouth daily.      . fenofibrate micronized (LOFIBRA) 200 MG capsule TAKE ONE CAPSULE BY MOUTH ONCE DAILY WITH BREAKFAST  30 capsule  0  . Flaxseed, Linseed, 1300 MG CAPS Take by mouth 2 (two) times daily with a meal.      . GARLIC PO Take 1 capsule by mouth daily.      Marland Kitchen ibuprofen (ADVIL,MOTRIN) 200 MG tablet Take 800 mg by mouth every 6 (six) hours as needed.      . metoprolol tartrate (LOPRESSOR) 25 MG tablet TAKE ONE-HALF TABLET BY MOUTH TWICE DAILY--      . Multiple Vitamins-Minerals (CENTRUM SILVER PO) Take 1 capsule by mouth daily.      . Omega-3 Fatty  Acids (FISH OIL) 1200 MG CAPS Take 1 capsule by mouth 2 (two) times daily. 1 by mouth daily      . phenazopyridine (PYRIDIUM) 100 MG tablet Take 1 tablet (100 mg total) by mouth every 8 (eight) hours as needed for pain (Burning urination.  Will turn urine and body fluids orange.).  30 tablet  6  . rosuvastatin (CRESTOR) 10 MG tablet Take 10 mg by mouth 2 (two) times a week. Monday and thursday      . senna-docusate (SENOKOT S) 8.6-50 MG per tablet Take 1 tablet by mouth 2 (two) times daily.  60 tablet  0  . albuterol (PROVENTIL HFA;VENTOLIN HFA) 108 (90 BASE) MCG/ACT inhaler Inhale 2 puffs into the lungs every 6 (six) hours as needed for wheezing.      Marland Kitchen NITROSTAT 0.4 MG SL tablet Place 0.4 mg under the tongue every 5 (five) minutes as needed.       Marland Kitchen oxyCODONE-acetaminophen (ROXICET) 5-325 MG per tablet Take 1 tablet by mouth every 8 (eight) hours as needed for severe pain.  20 tablet  0     Social History: History   Social History  . Marital Status: Married    Spouse Name: N/A    Number of Children: N/A  . Years of Education: N/A   Occupational History  . Not on file.   Social History Main Topics  . Smoking status: Former Smoker -- 2.00 packs/day for 3 years    Types: Cigarettes    Quit date: 01/12/1970  . Smokeless tobacco: Never Used  . Alcohol Use: No  . Drug Use: No  . Sexual Activity: Not on file   Other Topics Concern  . Not on file   Social History Narrative  . No narrative on file    Family History: Family History  Problem Relation Age of Onset  . Stroke Father     in 49s  . Colon cancer Father     upper 77's  . Heart attack Brother 26    stent  . Cancer Brother     pre cancerous bladder lesion  . Diabetes Paternal Uncle   . Arthritis Neg Hx   . Asthma Neg Hx   . COPD Neg Hx   . Rectal cancer Neg Hx   . Stomach cancer Neg Hx     Review of Systems: Positive: Bladder spasms. Negative: Fever, SOB, or chest pain.  A further 10 point review of systems  was negative except what is listed in the HPI.  Physical Exam: Filed Vitals:   02/15/13 [id number]  BP: 117/76  Pulse: 59  Temp: 96.9 F (36.1 C)  Resp: 18    General: No acute distress.  Awake. Head:  Normocephalic.  Atraumatic. ENT:  EOMI.  Mucous membranes moist Neck:  Supple.  No lymphadenopathy. CV:  S1 present. S2 present. Regular rate. Pulmonary: Equal effort bilaterally.  Clear to auscultation bilaterally. Abdomen: Soft.  Non tender to palpation. Skin:  Normal turgor.  No visible rash. Extremity: No gross deformity of bilateral upper extremities.  No gross deformity of    bilateral lower extremities. Neurologic: Alert. Appropriate mood.    Studies:  Recent Labs     02/15/13  1011  HGB  15.6    Recent Labs     02/15/13  1011  NA  141  K  4.1     No results found for this basename: PT, INR, APTT,  in the last 72 hours   No components found with this basename: ABG,     Assessment:  Right nephrolithiasis. Right ureter stenosis.  Plan: To OR for cystoscopy, right ureteroscopy, laser lithotripsy, right retrograde pyelogram, and right ureter stent exchange.

## 2013-02-15 NOTE — Anesthesia Preprocedure Evaluation (Addendum)
Anesthesia Evaluation  Patient identified by MRN, date of birth, ID band Patient awake    Reviewed: Allergy & Precautions, H&P , NPO status , Patient's Chart, lab work & pertinent test results  Airway Mallampati: II TM Distance: >3 FB Neck ROM: Full    Dental no notable dental hx. (+) Upper Dentures and Partial Lower   Pulmonary asthma , sleep apnea , COPDformer smoker,  breath sounds clear to auscultation  Pulmonary exam normal       Cardiovascular hypertension, Pt. on medications and Pt. on home beta blockers + CAD + dysrhythmias Rhythm:Regular Rate:Normal     Neuro/Psych negative neurological ROS  negative psych ROS   GI/Hepatic negative GI ROS, Neg liver ROS,   Endo/Other  negative endocrine ROS  Renal/GU Renal diseasenegative Renal ROS  negative genitourinary   Musculoskeletal negative musculoskeletal ROS (+)   Abdominal   Peds negative pediatric ROS (+)  Hematology negative hematology ROS (+)   Anesthesia Other Findings   Reproductive/Obstetrics negative OB ROS                          Anesthesia Physical Anesthesia Plan  ASA: III  Anesthesia Plan: General   Post-op Pain Management:    Induction: Intravenous  Airway Management Planned: LMA and Oral ETT  Additional Equipment:   Intra-op Plan:   Post-operative Plan: Extubation in OR  Informed Consent: I have reviewed the patients History and Physical, chart, labs and discussed the procedure including the risks, benefits and alternatives for the proposed anesthesia with the patient or authorized representative who has indicated his/her understanding and acceptance.   Dental advisory given  Plan Discussed with: CRNA  Anesthesia Plan Comments:         Anesthesia Quick Evaluation

## 2013-02-15 NOTE — Transfer of Care (Signed)
Immediate Anesthesia Transfer of Care Note  Patient: [place]  Procedure(s) Performed: Procedure(s) (LRB): CYSTOSCOPY WITH RETROGRADE PYELOGRAM, URETEROSCOPY AND STENT EXCHANGE (Right) HOLMIUM LASER APPLICATION (Right)  Patient Location: PACU  Anesthesia Type: General  Level of Consciousness: awake, oriented, sedated and patient cooperative  Airway & Oxygen Therapy: Patient Spontanous Breathing and Patient connected to face mask oxygen  Post-op Assessment: Report given to PACU RN and Post -op Vital signs reviewed and stable  Post vital signs: Reviewed and stable  Complications: No apparent anesthesia complications

## 2013-02-15 NOTE — Discharge Instructions (Signed)
DISCHARGE INSTRUCTIONS FOR KIDNEY STONES OR URETERAL STENT  MEDICATIONS:   1.  Resume all your other meds from home.  ACTIVITY 1. No strenuous activity x 1week 2. No driving while on narcotic pain medications 3. Drink plenty of water 4. Continue to walk at home - you can still get blood clots when you are at home, so keep active, but don't over do it. 5. May return to work in 3 days.  BATHING 1. You can shower. 2. If you have a string coming from your urethra:  The stent string is attached to your ureteral stent.  Do not pull on this.  If the stent gets pulled our partially before it is time to remove it, go ahead and remove the entire stent.  Call if you develop significant pain that lasts more than an hour.    SIGNS/SYMPTOMS TO CALL: 1. Please call us if you have a fever greater than 101.5, uncontrolled  nausea/vomiting, uncontrolled pain, dizziness, unable to urinate, chest pain, shortness of breath, leg swelling, leg pain, redness around wound, drainage from wound, or any other concerns or questions.  You can reach [place] at [phone].  FOLLOW-UP 1. If you have a string attached to your stent, you may remove it on Saturday, 02/18/13.  To do this, pull the string until the stent is completely removed.  You may feel an odd sensation in your back.   Post Anesthesia Home Care Instructions  Activity: Get plenty of rest for the remainder of the day. A responsible adult should stay with you for 24 hours following the procedure.  For the next 24 hours, DO NOT: -Drive a car -Paediatric nurse -Drink alcoholic beverages -Take any medication unless instructed by your physician -Make any legal decisions or sign important papers.  Meals: Start with liquid foods such as gelatin or soup. Progress to regular foods as tolerated. Avoid greasy, spicy, heavy foods. If nausea and/or vomiting occur, drink only clear liquids until the nausea and/or vomiting subsides. Call your physician if  vomiting continues.  Special Instructions/Symptoms: Your throat may feel dry or sore from the anesthesia or the breathing tube placed in your throat during surgery. If this causes discomfort, gargle with warm salt water. The discomfort should disappear within 24 hours.

## 2013-02-15 NOTE — Anesthesia Procedure Notes (Signed)
Procedure Name: LMA Insertion Date/Time: 02/15/2013 11:32 AM Performed by: Denna Haggard D Pre-anesthesia Checklist: Patient identified, Emergency Drugs available, Suction available and Patient being monitored Patient Re-evaluated:Patient Re-evaluated prior to inductionOxygen Delivery Method: Circle System Utilized Preoxygenation: Pre-oxygenation with 100% oxygen Intubation Type: IV induction Ventilation: Mask ventilation without difficulty LMA: LMA inserted LMA Size: 4.0 Number of attempts: 1 Airway Equipment and Method: bite block Placement Confirmation: positive ETCO2 Tube secured with: Tape Dental Injury: Teeth and Oropharynx as per pre-operative assessment

## 2013-02-16 ENCOUNTER — Encounter (HOSPITAL_BASED_OUTPATIENT_CLINIC_OR_DEPARTMENT_OTHER): Payer: Self-pay | Admitting: Urology

## 2013-02-16 NOTE — Anesthesia Postprocedure Evaluation (Signed)
Anesthesia Post Note  Patient: David Cantrell  Procedure(s) Performed: Procedure(s) (LRB): CYSTOSCOPY WITH RETROGRADE PYELOGRAM, URETEROSCOPY AND STENT EXCHANGE (Right) HOLMIUM LASER APPLICATION (Right)  Anesthesia type: General  Patient location: PACU  Post pain: Pain level controlled  Post assessment: Post-op Vital signs reviewed  Last Vitals:  Filed Vitals:   02/15/13 1515  BP: 137/77  Pulse:   Temp: 36.1 C  Resp: 14    Post vital signs: Reviewed  Level of consciousness: sedated  Complications: No apparent anesthesia complications

## 2013-02-24 ENCOUNTER — Encounter: Payer: Self-pay | Admitting: Internal Medicine

## 2013-02-24 ENCOUNTER — Ambulatory Visit (INDEPENDENT_AMBULATORY_CARE_PROVIDER_SITE_OTHER): Payer: Medicare HMO | Admitting: Internal Medicine

## 2013-02-24 VITALS — BP 110/80 | HR 67 | Temp 97.9°F | Wt 234.6 lb

## 2013-02-24 DIAGNOSIS — C44311 Basal cell carcinoma of skin of nose: Secondary | ICD-10-CM | POA: Insufficient documentation

## 2013-02-24 DIAGNOSIS — Z86010 Personal history of colonic polyps: Secondary | ICD-10-CM

## 2013-02-24 DIAGNOSIS — R9389 Abnormal findings on diagnostic imaging of other specified body structures: Secondary | ICD-10-CM

## 2013-02-24 DIAGNOSIS — C44319 Basal cell carcinoma of skin of other parts of face: Secondary | ICD-10-CM

## 2013-02-24 DIAGNOSIS — R93429 Abnormal radiologic findings on diagnostic imaging of unspecified kidney: Secondary | ICD-10-CM | POA: Insufficient documentation

## 2013-02-24 DIAGNOSIS — R918 Other nonspecific abnormal finding of lung field: Secondary | ICD-10-CM | POA: Insufficient documentation

## 2013-02-24 NOTE — Progress Notes (Signed)
   Subjective:    Patient ID: David Cantrell, male    DOB: 09/02/1940, 73 y.o.   MRN: [id number]  HPI  He is here discuss pulmonary nodules noted as an incidental finding during evaluation of renal calculi. Bibasilar pulmonary nodules were noted with the largest being 8 mm in diameter the left lower lobe.  The CAT scan also revealed a 2 mm nonobstructing distal right ureteral calculus. Bilateral renal cyst. An additional 9 mm nonobstructing right renal calculus in the mid right kidney with associated scarring.    Review of Systems   He has no significant constitutional symptoms of fever, chills, sweats, or unexplained weight loss. He will have occasional night sweats  He denies chest pain, dyspnea, sputum production, or hemoptysis.     Objective:   Physical Exam General appearance is one of good health and nourishment w/o distress.  Eyes: No conjunctival inflammation or scleral icterus is present.  Oral exam: Dental hygiene is good; lips and gums are healthy appearing.There is no oropharyngeal erythema or exudate noted.   Heart:  Normal rate and regular rhythm. S1 and S2 normal without gallop, murmur, click, rub or other extra sounds     Lungs:Chest clear to auscultation; no wheezes, rhonchi,rales ,or rubs present.No increased work of breathing.   Abdomen: bowel sounds normal, soft and non-tender without masses, organomegaly or hernias noted.  No guarding or rebound . No tenderness over the flanks to percussion  Musculoskeletal: Able to lie flat and sit up without help. Skin:Warm & dry.  Intact without suspicious lesions or rashes ; no jaundice or tenting. Seborrheic keratoses of posterior thorax  Lymphatic: No lymphadenopathy is noted about the head, neck, axilla              Assessment & Plan:  See Current Assessment & Plan in Problem List under specific Diagnosis

## 2013-02-24 NOTE — Assessment & Plan Note (Signed)
Annual F/U with Dr Allyson Sabal

## 2013-02-24 NOTE — Assessment & Plan Note (Signed)
As per Dr Jasmine December

## 2013-02-24 NOTE — Patient Instructions (Addendum)
CT of chest in June 2015. Please report any active pulmonary symptoms.

## 2013-02-24 NOTE — Progress Notes (Signed)
Pre visit review using our clinic review tool, if applicable. No additional management support is needed unless otherwise documented below in the visit note. 

## 2013-02-24 NOTE — Assessment & Plan Note (Signed)
Repeat CT in June 2015 to assess stability

## 2013-03-06 ENCOUNTER — Other Ambulatory Visit: Payer: Medicare HMO

## 2013-03-25 ENCOUNTER — Encounter: Payer: Self-pay | Admitting: Internal Medicine

## 2013-03-25 DIAGNOSIS — N200 Calculus of kidney: Secondary | ICD-10-CM | POA: Insufficient documentation

## 2013-03-29 ENCOUNTER — Other Ambulatory Visit: Payer: Self-pay | Admitting: *Deleted

## 2013-03-29 MED ORDER — ALBUTEROL SULFATE HFA 108 (90 BASE) MCG/ACT IN AERS: 2.0000 | INHALATION_SPRAY | Freq: Four times a day (QID) | RESPIRATORY_TRACT | Status: DC | PRN

## 2013-03-29 NOTE — Telephone Encounter (Signed)
Rx sent to the pharmacy by e-script.//AB/CMA 

## 2013-05-04 ENCOUNTER — Encounter: Payer: Self-pay | Admitting: Internal Medicine

## 2013-05-05 ENCOUNTER — Encounter: Payer: Self-pay | Admitting: Internal Medicine

## 2013-05-05 ENCOUNTER — Ambulatory Visit (INDEPENDENT_AMBULATORY_CARE_PROVIDER_SITE_OTHER): Payer: Medicare HMO | Admitting: Internal Medicine

## 2013-05-05 VITALS — BP 150/84 | HR 56 | Temp 98.0°F | Wt 238.8 lb

## 2013-05-05 DIAGNOSIS — J309 Allergic rhinitis, unspecified: Secondary | ICD-10-CM

## 2013-05-05 DIAGNOSIS — H4010X0 Unspecified open-angle glaucoma, stage unspecified: Secondary | ICD-10-CM

## 2013-05-05 DIAGNOSIS — R05 Cough: Secondary | ICD-10-CM

## 2013-05-05 DIAGNOSIS — I10 Essential (primary) hypertension: Secondary | ICD-10-CM

## 2013-05-05 DIAGNOSIS — R059 Cough, unspecified: Secondary | ICD-10-CM

## 2013-05-05 DIAGNOSIS — R062 Wheezing: Secondary | ICD-10-CM

## 2013-05-05 DIAGNOSIS — E785 Hyperlipidemia, unspecified: Secondary | ICD-10-CM

## 2013-05-05 DIAGNOSIS — H409 Unspecified glaucoma: Secondary | ICD-10-CM

## 2013-05-05 MED ORDER — METOPROLOL TARTRATE 25 MG PO TABS: ORAL_TABLET | ORAL | Status: DC

## 2013-05-05 MED ORDER — AMOXICILLIN 500 MG PO CAPS
500.0000 mg | ORAL_CAPSULE | Freq: Three times a day (TID) | ORAL | Status: DC
Start: 2013-05-05 — End: 2014-05-10

## 2013-05-05 MED ORDER — FLUTICASONE-SALMETEROL 250-50 MCG/DOSE IN AEPB: 1.0000 | INHALATION_SPRAY | Freq: Two times a day (BID) | RESPIRATORY_TRACT | Status: DC

## 2013-05-05 NOTE — Patient Instructions (Addendum)
Plain Mucinex (NOT D) for thick secretions ;force NON dairy fluids .   Nasal cleansing in the shower as discussed with lather of mild shampoo.After 10 seconds wash off lather while  exhaling through nostrils. Make sure that all residual soap is removed to prevent irritation.  Flonase OR Nasacort AQ 1 spray in each nostril twice a day as needed. Use the "crossover" technique into opposite nostril spraying toward opposite ear @ 45 degree angle, not straight up into nostril.  Use a Neti pot daily only  as needed for significant sinus congestion; going from open side to congested side . Plain Allegra (NOT D )  160 daily , Loratidine 10 mg , OR Zyrtec 10 mg @ bedtime  as needed for itchy eyes & sneezing. Advair one  inhalation every 12 hours; gargle and spit after use Fill the  prescription for antibiotic it there is not dramatic improvement in the next 72 hours. Minimal Blood Pressure Goal= AVERAGE < 140/90;  Ideal is an AVERAGE < 135/85. This AVERAGE should be calculated from @ least 5-7 BP readings taken @ different times of day on different days of week. You should not respond to isolated BP readings , but rather the AVERAGE for that week .Please bring your  blood pressure cuff to office visits to verify that it is reliable.It  can also be checked against the blood pressure device at the pharmacy. Finger or wrist cuffs are not dependable; an arm cuff is.

## 2013-05-05 NOTE — Progress Notes (Signed)
   Subjective:    Patient ID: David Cantrell, male    DOB: May 03, 1940, 73 y.o.   MRN: [id number]  HPI  Symptoms began 05/03/13 after mowing the lawn. The symptoms included sore throat, rhinitis, head congestion, and chest congestion and tightness. This was associated with a nonproductive cough. He also experienced itchy, watery eyes and sneezing. He believes he had low-grade fever as well as chills and sweats.  He used Mucinex and Allegra with only partial response. He also used nasal cleansing with a Neti pot   At this time he describes frontal and maxillary sinus congestion without pain. He has some discomfort in his teeth as well as ongoing sore throat. The cough remains dry but is associated with wheezing & shortness of breath. He has been using his albuterol only 1-2 times per week. He has no maintenance inhaler. The extrinsic symptoms persist.  He's requesting a shot of penicillin.    Review of Systems  Significant history includes open-angle glaucoma.  He is requesting a refill of his beta blocker as well as fenofibrate. The last lipids on record were 5/13 done by Dr. Wynonia Lawman. Dr. Wynonia Lawman saw him 5/14  &prescribed Crestor. There no other lipids on record.     Objective:   Physical Exam General appearance:good health ;well nourished; no acute distress or increased work of breathing is present.  No  lymphadenopathy about the head, neck, or axilla noted.   Eyes: No conjunctival inflammation or lid edema is present. There is no scleral icterus.  Ears:  External ear exam shows no significant lesions or deformities.  Otoscopic examination reveals clear canals, tympanic membranes are intact bilaterally without bulging, retraction, inflammation or discharge. Slight ridge R TM w/o perforation  Nose:  External nasal examination shows no deformity or inflammation. Nasal mucosa are pink and moist without lesions or exudates. No septal dislocation or deviation.No obstruction to airflow.    Oral exam: Dental hygiene is good; lips and gums are healthy appearing.There is no oropharyngeal erythema or exudate noted.   Neck:  No deformities,  masses, or tenderness noted.   Supple with full range of motion without pain.   Heart:  Normal rate and regular rhythm. S1 and S2 normal without gallop, murmur, click, rub or other extra sounds.   Lungs:Chest clear to auscultation; no wheezes, rhonchi,rales ,or rubs present.No increased work of breathing.    Extremities:  No cyanosis, edema, or clubbing  noted    Skin: Warm & dry          Assessment & Plan:  #1 allergic rhinitis  #2 cough with shortness breath and wheezing.  #3 hypertension; goals discussed  #4 dyslipidemia.  #5 open angle glaucoma  Plan:  I cannot renew the fibrate without review of his lipids. Dr. Wynonia Lawman can certainly prescribe this medication if he is going to monitor the lipids. Reasoning for this was discussed with him and his wife.

## 2013-05-05 NOTE — Progress Notes (Signed)
Pre visit review using our clinic review tool, if applicable. No additional management support is needed unless otherwise documented below in the visit note. 

## 2013-05-08 ENCOUNTER — Telehealth: Payer: Self-pay | Admitting: Internal Medicine

## 2013-05-08 ENCOUNTER — Encounter: Payer: Self-pay | Admitting: Internal Medicine

## 2013-05-08 MED ORDER — PREDNISONE 20 MG PO TABS: 20.0000 mg | ORAL_TABLET | Freq: Two times a day (BID) | ORAL | Status: DC

## 2013-05-08 NOTE — Telephone Encounter (Signed)
Relevant patient education assigned to patient using Emmi. ° °

## 2013-05-08 NOTE — Addendum Note (Signed)
Addended by: Shelly Coss on: 05/08/2013 08:31 AM   Modules accepted: Orders

## 2013-05-09 ENCOUNTER — Encounter: Payer: Self-pay | Admitting: Internal Medicine

## 2013-05-10 ENCOUNTER — Telehealth: Payer: Self-pay

## 2013-05-10 NOTE — Telephone Encounter (Signed)
05/09/13 letter has been faxed to Dr Landry Corporal at [phone]

## 2013-05-12 ENCOUNTER — Other Ambulatory Visit: Payer: Self-pay | Admitting: Internal Medicine

## 2013-05-12 MED ORDER — FENOFIBRATE MICRONIZED 200 MG PO CAPS: ORAL_CAPSULE | ORAL | Status: DC

## 2013-05-30 ENCOUNTER — Telehealth: Payer: Self-pay

## 2013-05-30 ENCOUNTER — Encounter: Payer: Self-pay | Admitting: Cardiology

## 2013-05-30 ENCOUNTER — Other Ambulatory Visit (INDEPENDENT_AMBULATORY_CARE_PROVIDER_SITE_OTHER): Payer: Medicare HMO

## 2013-05-30 DIAGNOSIS — E785 Hyperlipidemia, unspecified: Secondary | ICD-10-CM

## 2013-05-30 LAB — COMPREHENSIVE METABOLIC PANEL
ALBUMIN: 4.1 g/dL (ref 3.5–5.2)
ALK PHOS: 46 U/L (ref 39–117)
ALT: 28 U/L (ref 0–53)
AST: 25 U/L (ref 0–37)
BUN: 20 mg/dL (ref 6–23)
CO2: 27 mEq/L (ref 19–32)
Calcium: 9.5 mg/dL (ref 8.4–10.5)
Chloride: 106 mEq/L (ref 96–112)
Creatinine, Ser: 1.6 mg/dL — ABNORMAL HIGH (ref 0.4–1.5)
GFR: 46.43 mL/min — ABNORMAL LOW (ref >60.00)
Glucose, Bld: 99 mg/dL (ref 70–99)
POTASSIUM: 4.2 meq/L (ref 3.5–5.1)
Sodium: 139 mEq/L (ref 135–145)
Total Bilirubin: 0.9 mg/dL (ref 0.2–1.2)
Total Protein: 7.7 g/dL (ref 6.0–8.3)

## 2013-05-30 NOTE — Telephone Encounter (Signed)
Patient walked in requesting CK total and CMP lab orders be placed per Dr Viona Gilmore Doristine Church

## 2013-05-31 LAB — CK TOTAL AND CKMB (NOT AT ARMC): CK TOTAL: 58 U/L (ref 7–232)

## 2013-06-01 ENCOUNTER — Encounter: Payer: Self-pay | Admitting: Cardiology

## 2013-07-03 ENCOUNTER — Other Ambulatory Visit: Payer: Medicare HMO

## 2013-07-03 ENCOUNTER — Ambulatory Visit (INDEPENDENT_AMBULATORY_CARE_PROVIDER_SITE_OTHER)
Admission: RE | Admit: 2013-07-03 | Discharge: 2013-07-03 | Disposition: A | Discharge status: Home / Self Care | Payer: Medicare HMO | Source: Ambulatory Visit | Attending: Internal Medicine | Admitting: Internal Medicine

## 2013-07-03 DIAGNOSIS — R918 Other nonspecific abnormal finding of lung field: Secondary | ICD-10-CM

## 2013-07-06 ENCOUNTER — Telehealth: Payer: Self-pay | Admitting: Internal Medicine

## 2013-07-06 NOTE — Telephone Encounter (Signed)
Called pt no answer °

## 2013-07-06 NOTE — Telephone Encounter (Signed)
Patient states Dr. Linna Darner posted CT results to his my chart but he did not understand the results.  He is requesting a phone call to review the results.

## 2013-09-01 ENCOUNTER — Encounter: Payer: Self-pay | Admitting: Internal Medicine

## 2013-09-12 ENCOUNTER — Other Ambulatory Visit: Payer: Self-pay | Admitting: Dermatology

## 2013-10-03 ENCOUNTER — Ambulatory Visit (INDEPENDENT_AMBULATORY_CARE_PROVIDER_SITE_OTHER): Payer: Medicare HMO | Admitting: Internal Medicine

## 2013-10-03 ENCOUNTER — Encounter: Payer: Self-pay | Admitting: Internal Medicine

## 2013-10-03 VITALS — BP 132/80 | HR 59 | Temp 98.0°F | Resp 15 | Wt 236.2 lb

## 2013-10-03 DIAGNOSIS — M5136 Other intervertebral disc degeneration, lumbar region: Secondary | ICD-10-CM | POA: Insufficient documentation

## 2013-10-03 DIAGNOSIS — M5126 Other intervertebral disc displacement, lumbar region: Secondary | ICD-10-CM | POA: Insufficient documentation

## 2013-10-03 DIAGNOSIS — Z23 Encounter for immunization: Secondary | ICD-10-CM

## 2013-10-03 MED ORDER — GABAPENTIN 100 MG PO CAPS: ORAL_CAPSULE | ORAL | Status: DC

## 2013-10-03 NOTE — Progress Notes (Signed)
   Subjective:    Patient ID: David Cantrell, male    DOB: 1940-06-27, 73 y.o.   MRN: [id number]  HPI   He has had low back pain for at least 3-4 years. He has seen Dr. Nelva Bush and had an epidural steroid injection 2012 for bulging disc.  He went back to see Dr. Nelva Bush but apparently there is some insurance coverage issues.  He was requesting referral to [facility] for injections. I explained that he would need to be re- evaluated by a back specialist prior to such a referral  He does describes constant pain varying from dull, sharp, & electric at the level of the hips. It is nonradiating.   The pain is worse with activity such a shaving or brushing his teeth. It does improve standing straight up   He has had a history of 9 mm and 2 mm kidney stones treated by Dr. Jasmine December.    Review of Systems  He denies any constitutional symptoms, abdominal pain, melena, or rectal bleeding  He has no dysuria, pyuria, or hematuria.  He also denies weakness, numbness, or tingling in the lower extremities  There is no change in color or temperature of the skin in the  Area of the discomfort  He has no loss control bladder or bowels.      Objective:   Physical Exam   Pertinent or positive findings include: He "crawls " up from his chair and limps to the exam table. He is in pain lying flat and sitting up. He has pain in the lumbosacral area with straight leg raising. Deep tendon reflexes, strength, tone are normal.  General appearance :adequately nourished; in no distress. Eyes: No conjunctival inflammation or scleral icterus is present.  Heart:  Normal rate and regular rhythm. S1 and S2 normal without gallop, murmur, click, rub or other extra sounds     Lungs:Chest clear to auscultation; no wheezes, rhonchi,rales ,or rubs present.No increased work of breathing.   Abdomen: bowel sounds normal, soft and non-tender without masses, organomegaly or hernias noted.  No guarding or  rebound. No flank tenderness to percussion.  Skin:Warm & dry.  Intact without suspicious lesions or rashes ; no jaundice or tenting  Lymphatic: No lymphadenopathy is noted about the head, neck, axilla           Assessment & Plan:  #1 bulging disc  Plan see orders recommendations

## 2013-10-03 NOTE — Patient Instructions (Signed)
Use an anti-inflammatory cream such as Aspercreme or Zostrix cream twice a day to the affected area as needed. In lieu of this warm moist compresses or  hot water bottle can be used. Do not apply ice .  I recommend a non operative Back Specialty consultation to determine optimal therapy.

## 2013-10-03 NOTE — Progress Notes (Signed)
Pre visit review using our clinic review tool, if applicable. No additional management support is needed unless otherwise documented below in the visit note. 

## 2013-10-13 ENCOUNTER — Ambulatory Visit: Payer: Medicare HMO

## 2013-10-17 ENCOUNTER — Ambulatory Visit: Payer: Medicare HMO | Admitting: *Deleted

## 2013-10-17 DIAGNOSIS — Z23 Encounter for immunization: Secondary | ICD-10-CM

## 2013-10-17 MED ORDER — PNEUMOCOCCAL 13-VAL CONJ VACC IM SUSP: 0.5000 mL | INTRAMUSCULAR | Status: DC

## 2014-05-10 ENCOUNTER — Ambulatory Visit (INDEPENDENT_AMBULATORY_CARE_PROVIDER_SITE_OTHER): Payer: PPO | Admitting: Internal Medicine

## 2014-05-10 ENCOUNTER — Encounter: Payer: Self-pay | Admitting: Internal Medicine

## 2014-05-10 VITALS — BP 126/80 | HR 65 | Temp 98.2°F | Ht 73.0 in | Wt 236.5 lb

## 2014-05-10 DIAGNOSIS — R059 Cough, unspecified: Secondary | ICD-10-CM

## 2014-05-10 DIAGNOSIS — J310 Chronic rhinitis: Secondary | ICD-10-CM

## 2014-05-10 DIAGNOSIS — R05 Cough: Secondary | ICD-10-CM | POA: Diagnosis not present

## 2014-05-10 MED ORDER — AMOXICILLIN 500 MG PO CAPS: 500.0000 mg | ORAL_CAPSULE | Freq: Three times a day (TID) | ORAL | Status: DC

## 2014-05-10 NOTE — Progress Notes (Signed)
Pre visit review using our clinic review tool, if applicable. No additional management support is needed unless otherwise documented below in the visit note. 

## 2014-05-10 NOTE — Patient Instructions (Signed)
Plain Mucinex (NOT D) for thick secretions ;force NON dairy fluids .   Nasal cleansing in the shower as discussed with lather of mild shampoo.After 10 seconds wash off lather while  exhaling through nostrils. Make sure that all residual soap is removed to prevent irritation.  Flonase OR Nasacort AQ 1 spray in each nostril twice a day as needed. Use the "crossover" technique into opposite nostril spraying toward opposite ear @ 45 degree angle, not straight up into nostril.  Plain Allegra (NOT D )  160 daily , Loratidine 10 mg , OR Zyrtec 10 mg @ bedtime  as needed for itchy eyes & sneezing.  Zicam Melts or Zinc lozenges as per package label for sore throat .  Complementary options to boost immunity include  vitamin C 2000 mg daily; & Echinacea for 4-7 days.  Fill the  prescription for antibiotic if fever; discolored nasal or chest secretions; or frontal headache or facial pain  present

## 2014-05-10 NOTE — Progress Notes (Signed)
   Subjective:    Patient ID: David Cantrell, male    DOB: [birth year], 74 y.o.   MRN: [id number]  HPI His symptoms began 4/26/1 6 as sore throat, head congestion, chest congestion, and nonproductive cough. Also he had some paroxysms of sneezing. He felt achy and had low-grade fever. He has had the flu shot  He has taken Mucinex and Allegra. He's also been using nasal hygiene.  He's had some shortness of breath without wheezing.   Review of Systems He denies persistent frontal headache, facial pain, nasal purulence, otic pain, or otic discharge. There's been no purulent sputum.    Objective:   Physical Exam Pertinent or positive findings include: Pattern alopecia is present There is marked nasal erythema.  He has an upper plate and lower partial.  General appearance:Adequately nourished; no acute distress or increased work of breathing is present.   Lymphatic: No  lymphadenopathy about the head, neck, or axilla . Eyes: No conjunctival inflammation or lid edema is present. There is no scleral icterus. Ears:  External ear exam shows no significant lesions or deformities.  Otoscopic examination reveals clear canals, tympanic membranes are intact bilaterally without bulging, retraction, inflammation or discharge. Nose:  External nasal examination shows no deformity or inflammation. No septal dislocation or deviation.No obstruction to airflow.  Oral exam: lips and gums are healthy appearing.There is no oropharyngeal erythema or exudate . Neck:  No deformities, thyromegaly, masses, or tenderness noted.   Supple with full range of motion without pain.  Heart:  Normal rate and regular rhythm. S1 and S2 normal without gallop, murmur, click, rub or other extra sounds.  Lungs:Chest clear to auscultation; no wheezes, rhonchi,rales ,or rubs present. Extremities:  No cyanosis, edema, or clubbing  noted  Skin: Warm & dry w/o tenting or jaundice. No significant lesions or rash.       Assessment  & Plan:  #1 non allergic rhinitis #2 cough See Orders & AVS

## 2014-05-18 ENCOUNTER — Other Ambulatory Visit: Payer: Self-pay | Admitting: Internal Medicine

## 2014-07-25 ENCOUNTER — Telehealth: Payer: Self-pay

## 2014-07-25 NOTE — Telephone Encounter (Signed)
Call to the patient / had been out mowing yard in heat; stated he would come in for AWV / scheduled for Monday 7/18 at 8am.

## 2014-07-30 ENCOUNTER — Ambulatory Visit: Payer: PPO

## 2014-08-02 ENCOUNTER — Telehealth: Payer: Self-pay

## 2014-08-02 NOTE — Telephone Encounter (Signed)
Call to David Cantrell and stated he did report that he had missed the apt due to a death on the family. Will reschedule when he can; has direct number to call back when available; [phone]   Condolences for his loss.

## 2014-08-13 ENCOUNTER — Ambulatory Visit (INDEPENDENT_AMBULATORY_CARE_PROVIDER_SITE_OTHER)
Admission: RE | Admit: 2014-08-13 | Discharge: 2014-08-13 | Disposition: A | Discharge status: Home / Self Care | Payer: PPO | Source: Ambulatory Visit | Attending: Internal Medicine | Admitting: Internal Medicine

## 2014-08-13 ENCOUNTER — Ambulatory Visit (INDEPENDENT_AMBULATORY_CARE_PROVIDER_SITE_OTHER): Payer: PPO | Admitting: Internal Medicine

## 2014-08-13 ENCOUNTER — Encounter: Payer: Self-pay | Admitting: Internal Medicine

## 2014-08-13 ENCOUNTER — Other Ambulatory Visit: Payer: Self-pay | Admitting: Internal Medicine

## 2014-08-13 VITALS — BP 144/94 | HR 49 | Temp 98.1°F | Resp 22 | Wt 234.0 lb

## 2014-08-13 DIAGNOSIS — Z9189 Other specified personal risk factors, not elsewhere classified: Secondary | ICD-10-CM

## 2014-08-13 DIAGNOSIS — J4521 Mild intermittent asthma with (acute) exacerbation: Secondary | ICD-10-CM | POA: Diagnosis not present

## 2014-08-13 DIAGNOSIS — R918 Other nonspecific abnormal finding of lung field: Secondary | ICD-10-CM

## 2014-08-13 MED ORDER — PREDNISONE 20 MG PO TABS: 20.0000 mg | ORAL_TABLET | Freq: Two times a day (BID) | ORAL | Status: DC

## 2014-08-13 MED ORDER — ALBUTEROL SULFATE HFA 108 (90 BASE) MCG/ACT IN AERS: 2.0000 | INHALATION_SPRAY | Freq: Four times a day (QID) | RESPIRATORY_TRACT | Status: DC | PRN

## 2014-08-13 NOTE — Patient Instructions (Signed)
  Your next office appointment will be determined based upon review of your pending  xrays  Those written interpretation of the lab results and instructions will be transmitted to you by My Chart   Critical results will be called.   Followup as needed for any active or acute issue. Please report any significant change in your symptoms.  If albuterol, the rescue agent, is needed more than 2-3 times per week except  to prevent exercise-induced symptoms; the maintenance agent should be used  preventatively on a daily basis.  It may  be used 15-30 minutes before exercise if that is also  a trigger for your asthma.   Breo,Symbicort , Dulera , Advair or other maintenance agent should be considered to control smooth muscle spasm and airway inflammation  and to prevent adverse effects from excess albuterol use.Those adverse effects can include health or life threatening heart rhythm irregularities. .  To use Breo: Pull cap down to release medication. Blow out as much as possible then inhale powder as deeply as possible. Hold breath to count of ten then exhale.  Gargle and spit after use. Lot #: [id number] Expiration date:12/17

## 2014-08-13 NOTE — Progress Notes (Signed)
Pre visit review using our clinic review tool, if applicable. No additional management support is needed unless otherwise documented below in the visit note. 

## 2014-08-13 NOTE — Progress Notes (Signed)
   Subjective:    Patient ID: David Cantrell, male    DOB: 09-29-1940, 74 y.o.   MRN: [id number]  HPI He's had increasing difficulties over the last 2 months with respiratory function which is worsening progressively. He now cannot complete normal activities such as mowing the grass without associated fatigue and shortness of breath. He has stopped exercising because of the fatigue and shortness of breath. He describes a sensation of feeling throat "clogging". He tries to cough up secretions but none produced.He did have a cough with isolated green sputum last week but this did not persist.   He describes associated wheezing and occasional paroxysmal nocturnal dyspnea approximately twice a week. He describes some popping in his ears.  He's been using albuterol twice a day without benefit. He's also been using Allegra & Mucinex on occasion.  He has a history of exercise-induced bronchospasm but no other history of asthma. Family history is negative for such as well.  He denies fever or chills. He has no associated chest pain or generalized tightness. There are no symptoms of upper respiratory tract infection. He also denies significant reflux symptoms.  He has a history of pulmonary nodules which appear to be stable compared to imaging dating back to 2004. Follow-up was recommended at this time to rule out any progression.  Preoperatively the screening test for sleep apnea was potentially positive. His wife states he does snore but denies any associated apnea.    Review of Systems Frontal headache, facial pain , nasal purulence, dental pain, sore throat , otic pain or otic discharge denied. No fever , chills or sweats.Unexplained weight loss, abdominal pain, significant dyspepsia, dysphagia, melena, rectal bleeding, or persistently small caliber stools are denied.     Objective:   Physical Exam  Pertinent or positive findings include: Pattern alopecia is present. He has an upper plate and  lower partial. Intermittent slight expiratory wheezes suggested over the upper airway but not over the chest. Abdomen is protuberant.  General appearance :adequately nourished; in no distress.  Eyes: No conjunctival inflammation or scleral icterus is present.  Oral exam:  Lips and gums are healthy appearing.There is no oropharyngeal erythema or exudate noted.   Heart:  Normal rate and regular rhythm. S1 and S2 normal without gallop, murmur, click, rub or other extra sounds    Lungs:Chest clear to auscultation; no wheezes, rhonchi,rales ,or rubs present.No increased work of breathing.   Abdomen: bowel sounds normal, soft and non-tender without masses, organomegaly or hernias noted.  No guarding or rebound.    Vascular : all pulses equal ; no bruits present.  Skin:Warm & dry.  Intact without suspicious lesions or rashes ; no tenting or jaundice   Lymphatic: No lymphadenopathy is noted about the head, neck, axilla   Neuro: Strength, tone normal.         Assessment & Plan:  #1 asthma exacerbation  #2 at risk for sleep apnea  #3 pulmonary nodules  Plan see orders and after visit summary

## 2014-09-05 ENCOUNTER — Other Ambulatory Visit: Payer: Self-pay | Admitting: Internal Medicine

## 2014-09-05 ENCOUNTER — Ambulatory Visit (INDEPENDENT_AMBULATORY_CARE_PROVIDER_SITE_OTHER)
Admission: RE | Admit: 2014-09-05 | Discharge: 2014-09-05 | Disposition: A | Discharge status: Home / Self Care | Payer: PPO | Source: Ambulatory Visit | Attending: Internal Medicine | Admitting: Internal Medicine

## 2014-09-05 DIAGNOSIS — R918 Other nonspecific abnormal finding of lung field: Secondary | ICD-10-CM

## 2014-09-10 ENCOUNTER — Encounter: Payer: Self-pay | Admitting: Internal Medicine

## 2014-09-11 ENCOUNTER — Other Ambulatory Visit: Payer: PPO

## 2014-09-11 ENCOUNTER — Encounter: Payer: Self-pay | Admitting: Pulmonary Disease

## 2014-09-11 ENCOUNTER — Ambulatory Visit (INDEPENDENT_AMBULATORY_CARE_PROVIDER_SITE_OTHER): Payer: PPO | Admitting: Pulmonary Disease

## 2014-09-11 VITALS — BP 132/74 | HR 94 | Ht 73.0 in | Wt 231.0 lb

## 2014-09-11 DIAGNOSIS — R918 Other nonspecific abnormal finding of lung field: Secondary | ICD-10-CM | POA: Diagnosis not present

## 2014-09-11 DIAGNOSIS — J479 Bronchiectasis, uncomplicated: Secondary | ICD-10-CM

## 2014-09-11 MED ORDER — FLUTTER DEVI: Status: DC

## 2014-09-11 NOTE — Assessment & Plan Note (Signed)
He has bronchiectasis noted on his CT scan with scattered centrilobular emphysema. This fits with his clinical syndrome of recurrent episodes of bronchitis and pneumonia over the years. He has a minimal smoking history so I have concern for alpha-1 antitrypsin deficiency considering the emphysema. 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 A quick literature search did not reveal emphysema or bronchiectasis as a complication of her Mare [employer] in exposure.  He has minimal symptoms right now, so I think continuing the Symbicort alone is all that's necessary in terms of treatment.  Plan: Continue Symbicort Test for genetic causes of bronchiectasis as well as immunodeficiency (will send alpha-1 antitrypsin, CF screen, and immunoglobulin profile) Use flutter valve 4-5 times a day for mucociliary clearance Consider pulmonary function test on next visit

## 2014-09-11 NOTE — Patient Instructions (Signed)
Your CT scan shows emphysema and bronchiectasis (enlarged airways which are prone to recurrent infections) and suggests a chronic infection. To assess this we are going to do blood work for causes of bronchiectasis and test your mucus for evidence of infection. Bring in 3 separate samples of your mucus so that we can tested Use the flutter valve 4-5 breaths, 4-5 times a day Take 1200 mg of Mucinex twice a day If you were unable to provide mucus within the next week let us know so that we can schedule a bronchoscopy We will repeat a CT chest in 3 months and see you after that

## 2014-09-11 NOTE — Progress Notes (Signed)
Subjective:    Patient ID: David Cantrell, male    DOB: [birth year], 74 y.o.   MRN: [id number]  HPI Chief Complaint  Patient presents with  . Advice Only    referred by asthma by Dr. Linna Darner.  pt c/o doe, wheezing X3 mos.     this is a very pleasant 74 year old male who comes to our clinic today for evaluation of an abnormal CT scan of the chest. He had a normal childhood without respiratory illnesses and was one of 8 children. He says there is no history of lung disease in his family. 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 He smoked very briefly, for only 3 years, and quit 44 years ago. He never had a respiratory illness during his early adult life that he recalls. However, he has had multiple episodes of bronchitis over the years and he says he's been diagnosed with pneumonia 3 times. In the last several years he says that he has continued to experience bronchitis and at some point he was diagnosed with asthma and was started on Symbicort. He's not entirely clear when this started. He notes that since Mother's Day of this year he has had some chest tightness, wheezing, and shortness of breath. He has continued the Symbicort during this time. He's not sure if the Symbicort has helped. He says that his shortness of breath is actually improved in the last few days without much intervention. One year ago he came to the doctor's office and was diagnosed with a kidney stone by a CT scan. This CT showed a pulmonary nodule. He's had 2 CTs of his chest since then, the most recent was last week. He's been sent here to see me because the most recent CT scan showed multiple pulmonary nodules, one of which was 1.1 cm. He has some night sweats but no recent weight loss. He does have chest congestion on daily basis but he says he doesn't typically produce mucus. This morning however, he did cough up a bit more mucus. As noted, he worked in [employer] with [employer]. He also notes ammonia exposure over the years. 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  Past Medical History  Diagnosis Date  . Hypertension   . Hyperlipidemia   . CAD (coronary artery disease)     CARDIOLOGIST--  DR Wynonia Lawman  . Seasonal allergies   . Right nephrolithiasis   . Right ureteral stone   . COPD (chronic obstructive pulmonary disease)   . First degree heart block   . History of adenomatous polyp of colon     HIGH GRADE SIGMOID DYSPLASIA  S/P POLYPECTOMY  . Open-angle glaucoma     RIGHT EYE ONLY  . Exercise-induced asthma   . At risk for sleep apnea     STOP-BANG = 4  SENT TO PCP 02-09-2013     Family History  Problem Relation Age of Onset  . Stroke Father     in 33s  . Colon cancer Father     upper 50's  . Heart attack Brother 37    stent  . Cancer Brother     pre cancerous bladder lesion  . Diabetes Paternal Uncle   . Arthritis Neg Hx   . Asthma Neg Hx   . COPD Neg Hx   . Rectal cancer Neg Hx   . Stomach cancer Neg Hx  Social History   Social History  . Marital Status: Married    Spouse Name: N/A  . Number of Children: N/A  . Years of Education: N/A   Occupational History  . Not on file.   Social History Main Topics  . Smoking status: Former Smoker -- 2.00 packs/day for 3 years    Types: Cigarettes    Quit date: 01/12/1970  . Smokeless tobacco: Never Used     Comment: smoked [place], up to 2 ppd  . Alcohol Use: No  . Drug Use: No  . Sexual Activity: Not on file   Other Topics Concern  . Not on file   Social History Narrative     Allergies  Allergen Reactions  . Lipitor [Atorvastatin] Other (See Comments)    MUSCLE ACHES  . Morphine And Related Other (See Comments)    Severe headache  . Niacin And Related Other (See Comments)    FLUSHING  . Ramipril Other (See Comments)    COUGH  . Sulfa Antibiotics Hives  . Codeine Other (See Comments)    CONSTIPATION      Outpatient Prescriptions Prior to Visit  Medication Sig Dispense Refill  . albuterol (PROVENTIL HFA;VENTOLIN HFA) 108 (90 BASE) MCG/ACT inhaler Inhale 2 puffs into the lungs every 6 (six) hours as needed for wheezing or shortness of breath. 1 Inhaler 2  . aspirin EC 81 MG tablet Take 81 mg by mouth daily. PER DR TILLEY DO NOT STOP FOR PROCEDURE    . Brinzolamide-Brimonidine (SIMBRINZA) 1-0.2 % SUSP Place 1 drop into the right eye 2 (two) times daily.    . Coenzyme Q10 (COQ10) 100 MG CAPS Take 1 capsule by mouth daily.    . dorzolamide-timolol (COSOPT) 22.3-6.8 MG/ML ophthalmic solution Place 1 drop into the right eye 2 (two) times daily.    . fenofibrate micronized (LOFIBRA) 200 MG capsule TAKE ONE CAPSULE BY MOUTH ONCE DAILY WITH BREAKFAST 30 capsule 0  . Flaxseed, Linseed, 1300 MG CAPS Take by mouth 2 (two) times daily with a meal.    . Fluticasone-Salmeterol (ADVAIR DISKUS) 250-50 MCG/DOSE AEPB Inhale 1 puff into the lungs 2 (two) times daily. (Patient taking differently: Inhale 1 puff into the lungs 2 (two) times daily as needed. ) 14 each 0  . GARLIC PO Take 1 capsule by mouth daily.    Marland Kitchen ibuprofen (ADVIL,MOTRIN) 200 MG tablet Take 800 mg by mouth every 6 (six) hours as needed.    . metoprolol tartrate (LOPRESSOR) 25 MG tablet TAKE ONE-HALF TABLET BY MOUTH TWICE DAILY 90 tablet 1  . Multiple Vitamins-Minerals (CENTRUM SILVER PO) Take 1 capsule by mouth daily.    Marland Kitchen NITROSTAT 0.4 MG SL tablet Place 0.4 mg under the tongue every 5 (five) minutes as needed.     . Omega-3 Fatty Acids (FISH OIL) 1200 MG CAPS Take 1 capsule by mouth 2 (two) times daily. 1 by mouth daily    . rosuvastatin (CRESTOR) 10 MG tablet Take 10 mg by mouth 2 (two) times a week. Monday and thursday    . senna-docusate (SENOKOT S) 8.6-50 MG per tablet Take 1 tablet by mouth 2 (two) times daily. (Patient taking differently: Take 1 tablet by mouth 2 (two) times daily as needed. ) 60 tablet 0  . amoxicillin (AMOXIL) 500 MG  capsule Take 1 capsule (500 mg total) by mouth 3 (three) times daily. 21 capsule 0  . gabapentin (NEURONTIN) 100 MG capsule One pill every eight hours as needed; dose may be  increased by one pill each dose after 72 hours if only partially effective (Patient not taking: Reported on 09/11/2014) 30 capsule 1  . predniSONE (DELTASONE) 20 MG tablet Take 1 tablet (20 mg total) by mouth 2 (two) times daily with a meal. 15 tablet 0  . UNABLE TO FIND Place 2 drops into the right eye daily. Alcon eye drops for Glaucoma     Facility-Administered Medications Prior to Visit  Medication Dose Route Frequency Provider Last Rate Last Dose  . pneumococcal 13-valent conjugate vaccine (PREVNAR 13) injection 0.5 mL  0.5 mL Intramuscular Tomorrow-1000 Hendricks Limes, MD           Review of Systems  Constitutional: Negative for fever and unexpected weight change.  HENT: Positive for sinus pressure. Negative for congestion, dental problem, ear pain, nosebleeds, postnasal drip, rhinorrhea, sneezing, sore throat and trouble swallowing.   Eyes: Negative for redness and itching.  Respiratory: Positive for cough, chest tightness, shortness of breath and wheezing.   Cardiovascular: Negative for palpitations and leg swelling.  Gastrointestinal: Negative for nausea and vomiting.  Genitourinary: Negative for dysuria.  Musculoskeletal: Negative for joint swelling.  Skin: Negative for rash.  Neurological: Negative for headaches.  Hematological: Does not bruise/bleed easily.  Psychiatric/Behavioral: Negative for dysphoric mood. The patient is not nervous/anxious.        Objective:   Physical Exam Filed Vitals:   09/11/14 0917  BP: 132/74  Pulse: 94  Height: 6\' 1"  (1.854 m)  Weight: 231 lb (104.781 kg)  SpO2: 93%   RA  Gen: well appearing, no acute distress HENT: NCAT, OP clear, neck supple without masses Eyes: PERRL, EOMi Lymph: no cervical lymphadenopathy PULM: CTA B CV: RRR, no mgr, no JVD GI: BS+,  soft, nontender, no hsm Derm: no rash or skin breakdown MSK: normal bulk and tone Neuro: A&Ox4, CN II-XII intact, strength 5/5 in all 4 extremities Psyche: normal mood and affect  09/05/2014 CT chest images personally reviewed> mild bronchiectasis in the right middle lobe and the lower lobes bilaterally with associated scattered nodules and mild centrilobular emphysema  Dr. Clayborn Heron notes were reviewed from where he was treated for asthma and referred to [place] for pulmonary nodules.    Assessment & Plan:  Multiple pulmonary nodules I have personally reviewed the images of his CT chest which reveals multiple pulmonary nodules, the largest of which is 1.1 cm and located in the right upper lobe. This was noted to be new. 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  Certainly, the differential diagnosis of these pulmonary nodules includes other benign causes and malignancy. He has a very limited smoking history so I don't consider his wrist compared to other men of his age excessively high. Notably, the emphysema does predispose him somewhat to lung cancer thereby increasing his risk somewhat.  Plan: I think the best approach moving forward is to try to diagnose him with MAI while carefully monitoring the nodules. Collect 3 sputum samples and send for AFB culture, if he is unable to provide this then we will perform a bronchoscopy Plan repeat CT chest in 3 months  Bronchiectasis without acute exacerbation He has bronchiectasis noted on his CT scan with scattered centrilobular emphysema. This fits with his clinical syndrome of recurrent episodes of bronchitis and pneumonia over the years.  He has a minimal smoking history so I have concern for alpha-1 antitrypsin  deficiency considering the emphysema. 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 A quick literature search did not reveal emphysema or bronchiectasis as a complication of her Mare [employer] in exposure.  He has minimal symptoms right now, so I think continuing the Symbicort alone is all that's necessary in terms of treatment.  Plan: Continue Symbicort Test for genetic causes of bronchiectasis as well as immunodeficiency (will send alpha-1 antitrypsin, CF screen, and immunoglobulin profile) Use flutter valve 4-5 times a day for mucociliary clearance Consider pulmonary function test on next visit     Current outpatient prescriptions:  .  albuterol (PROVENTIL HFA;VENTOLIN HFA) 108 (90 BASE) MCG/ACT inhaler, Inhale 2 puffs into the lungs every 6 (six) hours as needed for wheezing or shortness of breath., Disp: 1 Inhaler, Rfl: 2 .  aspirin EC 81 MG tablet, Take 81 mg by mouth daily. PER DR TILLEY DO NOT STOP FOR PROCEDURE, Disp: , Rfl:  .  Brinzolamide-Brimonidine (SIMBRINZA) 1-0.2 % SUSP, Place 1 drop into the right eye 2 (two) times daily., Disp: , Rfl:  .  Coenzyme Q10 (COQ10) 100 MG CAPS, Take 1 capsule by mouth daily., Disp: , Rfl:  .  dorzolamide-timolol (COSOPT) 22.3-6.8 MG/ML ophthalmic solution, Place 1 drop into the right eye 2 (two) times daily., Disp: , Rfl:  .  fenofibrate micronized (LOFIBRA) 200 MG capsule, TAKE ONE CAPSULE BY MOUTH ONCE DAILY WITH BREAKFAST, Disp: 30 capsule, Rfl: 0 .  Flaxseed, Linseed, 1300 MG CAPS, Take by mouth 2 (two) times daily with a meal., Disp: , Rfl:  .  Fluticasone-Salmeterol (ADVAIR DISKUS) 250-50 MCG/DOSE AEPB, Inhale 1 puff into the lungs 2 (two) times daily. (Patient taking differently: Inhale 1 puff into the lungs 2 (two) times daily as needed. ), Disp: 14 each, Rfl: 0 .  GARLIC PO, Take 1 capsule by mouth daily., Disp: , Rfl:  .   ibuprofen (ADVIL,MOTRIN) 200 MG tablet, Take 800 mg by mouth every 6 (six) hours as needed., Disp: , Rfl:  .  metoprolol tartrate (LOPRESSOR) 25 MG tablet, TAKE ONE-HALF TABLET BY MOUTH TWICE DAILY, Disp: 90 tablet, Rfl: 1 .  Multiple Vitamins-Minerals (CENTRUM SILVER PO), Take 1 capsule by mouth daily., Disp: , Rfl:  .  NITROSTAT 0.4 MG SL tablet, Place 0.4 mg under the tongue every 5 (five) minutes as needed. , Disp: , Rfl:  .  Omega-3 Fatty Acids (FISH OIL) 1200 MG CAPS, Take 1 capsule by mouth 2 (two) times daily. 1 by mouth daily, Disp: , Rfl:  .  rosuvastatin (CRESTOR) 10 MG tablet, Take 10 mg by mouth 2 (two) times a week. Monday and thursday, Disp: , Rfl:  .  senna-docusate (SENOKOT S) 8.6-50 MG per tablet, Take 1 tablet by mouth 2 (two) times daily. (Patient taking differently: Take 1 tablet by mouth 2 (two) times daily as needed. ), Disp: 60 tablet, Rfl: 0 .  Respiratory Therapy Supplies (FLUTTER) DEVI, Use as directed, Disp: 1 each, Rfl: 0  Current facility-administered medications:  .  pneumococcal 13-valent conjugate vaccine (PREVNAR 13) injection 0.5 mL, 0.5 mL, Intramuscular, Tomorrow-1000, Hendricks Limes, MD

## 2014-09-11 NOTE — Telephone Encounter (Signed)
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

## 2014-09-11 NOTE — Assessment & Plan Note (Signed)
I have personally reviewed the images of his CT chest which reveals multiple pulmonary nodules, the largest of which is 1.1 cm and located in the right upper lobe. This was noted to be new. 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  Certainly, the differential diagnosis of these pulmonary nodules includes other benign causes and malignancy. He has a very limited smoking history so I don't consider his wrist compared to other men of his age excessively high. Notably, the emphysema does predispose him somewhat to lung cancer thereby increasing his risk somewhat.  Plan: I think the best approach moving forward is to try to diagnose him with MAI while carefully monitoring the nodules. Collect 3 sputum samples and send for AFB culture, if he is unable to provide this then we will perform a bronchoscopy Plan repeat CT chest in 3 months

## 2014-09-12 LAB — IGG, IGA, IGM
IgA: 240 mg/dL (ref 68–379)
IgG (Immunoglobin G), Serum: 942 mg/dL (ref 650–1600)
IgM, Serum: 1000 mg/dL — ABNORMAL HIGH (ref 41–251)

## 2014-09-12 NOTE — Telephone Encounter (Signed)
Please advise 

## 2014-09-13 ENCOUNTER — Other Ambulatory Visit: Payer: Self-pay | Admitting: Pulmonary Disease

## 2014-09-13 DIAGNOSIS — R768 Other specified abnormal immunological findings in serum: Secondary | ICD-10-CM

## 2014-09-13 DIAGNOSIS — D472 Monoclonal gammopathy: Secondary | ICD-10-CM

## 2014-09-18 ENCOUNTER — Telehealth: Payer: Self-pay | Admitting: Pulmonary Disease

## 2014-09-18 ENCOUNTER — Ambulatory Visit (INDEPENDENT_AMBULATORY_CARE_PROVIDER_SITE_OTHER): Payer: PPO

## 2014-09-18 DIAGNOSIS — Z23 Encounter for immunization: Secondary | ICD-10-CM

## 2014-09-18 LAB — CYSTIC FIBROSIS GENE TEST

## 2014-09-18 NOTE — Telephone Encounter (Signed)
LMTCB

## 2014-09-18 NOTE — Telephone Encounter (Signed)
Spoke with the pt  He states calling to let Dr. Lake Bells know that he has not been able to produce any sputum at all  He is unsure if bronch is needed  He states his breathing has improved and he feels well  He has been having some PND that gives him the sensation he needs to cough, but not coughing much at all  I rec that he try chlortrimeton at night to see if this helps  BQ- please advise on bronch thanks

## 2014-09-18 NOTE — Telephone Encounter (Signed)
Pt returned call [phone]

## 2014-09-19 LAB — ALPHA-1 ANTITRYPSIN PHENOTYPE: A-1 Antitrypsin: 163 mg/dL (ref 83–199)

## 2014-09-19 NOTE — Telephone Encounter (Signed)
I would like to arrange a bronchoscopy at [facility] anytime tomorrow.  He will need a CBC and PT/INR.  If he takes a blood thinner tell him to hold it tomorrow AM.  Stay NPO after midnight.

## 2014-09-19 NOTE — Telephone Encounter (Signed)
Spoke with BQ, he is ok with this time frame.  lmtcb for Baxter Flattery at cardiopulmonary to hold this spot for the pt.  Will call back

## 2014-09-19 NOTE — Telephone Encounter (Signed)
I called and spoke with David Cantrell to try and schedule bronch tomorrow.  Per David Cantrell they are booked tomorrow and Friday, and the next available at either location is 11:00 on Monday.    BQ please advise if this is ok to schedule.  Thanks!

## 2014-09-20 ENCOUNTER — Telehealth: Payer: Self-pay | Admitting: Pulmonary Disease

## 2014-09-20 ENCOUNTER — Encounter: Payer: Self-pay | Admitting: Internal Medicine

## 2014-09-20 NOTE — Telephone Encounter (Signed)
Patient scheduled for Bronchoscopy w/ Fluoro on Monday, 9/12 at 11am at [facility] Patient notified. Patient notified not to take blood thinners.  FYI to Dr. Lake Bells

## 2014-09-20 NOTE — Telephone Encounter (Signed)
See 09/20/14 phone note. Sharyn Lull has already scheduled bronch and she spoke with pt. Will sign off.

## 2014-09-20 NOTE — Telephone Encounter (Signed)
thanks

## 2014-09-21 ENCOUNTER — Other Ambulatory Visit: Payer: Self-pay | Admitting: Internal Medicine

## 2014-09-21 DIAGNOSIS — I712 Thoracic aortic aneurysm, without rupture, unspecified: Secondary | ICD-10-CM

## 2014-09-24 ENCOUNTER — Inpatient Hospital Stay (HOSPITAL_COMMUNITY)
Admission: RE | Admit: 2014-09-24 | Discharge: 2014-09-24 | Disposition: A | Discharge status: Home / Self Care | Payer: PPO | Source: Ambulatory Visit

## 2014-09-24 ENCOUNTER — Ambulatory Visit (HOSPITAL_COMMUNITY): Admission: AD | Admit: 2014-09-24 | Payer: Self-pay | Source: Ambulatory Visit | Admitting: Pulmonary Disease

## 2014-09-24 ENCOUNTER — Encounter (HOSPITAL_COMMUNITY)
Admission: RE | Disposition: A | Discharge status: Home / Self Care | Payer: Self-pay | Source: Ambulatory Visit | Attending: Pulmonary Disease

## 2014-09-24 ENCOUNTER — Encounter (HOSPITAL_COMMUNITY): Admission: AD | Payer: Self-pay | Source: Ambulatory Visit

## 2014-09-24 ENCOUNTER — Ambulatory Visit (HOSPITAL_COMMUNITY)
Admission: RE | Admit: 2014-09-24 | Discharge: 2014-09-24 | Disposition: A | Discharge status: Home / Self Care | Payer: PPO | Source: Ambulatory Visit | Attending: Pulmonary Disease | Admitting: Pulmonary Disease

## 2014-09-24 DIAGNOSIS — E785 Hyperlipidemia, unspecified: Secondary | ICD-10-CM | POA: Insufficient documentation

## 2014-09-24 DIAGNOSIS — Z87891 Personal history of nicotine dependence: Secondary | ICD-10-CM | POA: Diagnosis not present

## 2014-09-24 DIAGNOSIS — J449 Chronic obstructive pulmonary disease, unspecified: Secondary | ICD-10-CM | POA: Insufficient documentation

## 2014-09-24 DIAGNOSIS — I2510 Atherosclerotic heart disease of native coronary artery without angina pectoris: Secondary | ICD-10-CM | POA: Diagnosis not present

## 2014-09-24 DIAGNOSIS — J479 Bronchiectasis, uncomplicated: Secondary | ICD-10-CM | POA: Insufficient documentation

## 2014-09-24 DIAGNOSIS — R918 Other nonspecific abnormal finding of lung field: Secondary | ICD-10-CM | POA: Diagnosis not present

## 2014-09-24 DIAGNOSIS — I10 Essential (primary) hypertension: Secondary | ICD-10-CM | POA: Insufficient documentation

## 2014-09-24 HISTORY — PX: VIDEO BRONCHOSCOPY: SHX5072

## 2014-09-24 LAB — CBC
HCT: 43.9 % (ref 39.0–52.0)
Hemoglobin: 15.3 g/dL (ref 13.0–17.0)
MCH: 31.9 pg (ref 26.0–34.0)
MCHC: 34.9 g/dL (ref 30.0–36.0)
MCV: 91.5 fL (ref 78.0–100.0)
PLATELETS: 248 10*3/uL (ref 150–400)
RBC: 4.8 MIL/uL (ref 4.22–5.81)
RDW: 13.1 % (ref 11.5–15.5)
WBC: 7.8 10*3/uL (ref 4.0–10.5)

## 2014-09-24 SURGERY — VIDEO BRONCHOSCOPY WITHOUT FLUORO
Anesthesia: Moderate Sedation

## 2014-09-24 SURGERY — BRONCHOSCOPY, WITH FLUOROSCOPY
Anesthesia: Moderate Sedation | Laterality: Bilateral

## 2014-09-24 MED ORDER — SODIUM CHLORIDE 0.9 % IV SOLN
INTRAVENOUS | Status: DC
  Administered 2014-09-24: 11:00:00 via INTRAVENOUS

## 2014-09-24 MED ORDER — FENTANYL CITRATE (PF) 100 MCG/2ML IJ SOLN
INTRAMUSCULAR | Status: DC | PRN
  Administered 2014-09-24: 50 ug via INTRAVENOUS

## 2014-09-24 MED ORDER — FENTANYL CITRATE (PF) 100 MCG/2ML IJ SOLN
INTRAMUSCULAR | Status: AC
  Filled 2014-09-24: qty 4

## 2014-09-24 MED ORDER — MIDAZOLAM HCL 10 MG/2ML IJ SOLN
INTRAMUSCULAR | Status: DC | PRN
  Administered 2014-09-24: 2 mg via INTRAVENOUS

## 2014-09-24 MED ORDER — MIDAZOLAM HCL 5 MG/ML IJ SOLN
INTRAMUSCULAR | Status: AC
  Filled 2014-09-24: qty 2

## 2014-09-24 MED ORDER — PHENYLEPHRINE HCL 0.25 % NA SOLN
NASAL | Status: DC | PRN
Start: 2014-09-24 — End: 2014-09-24
  Administered 2014-09-24: 1 via NASAL

## 2014-09-24 MED ORDER — PHENYLEPHRINE HCL 0.25 % NA SOLN
1.0000 | Freq: Four times a day (QID) | NASAL | Status: DC | PRN
  Filled 2014-09-24: qty 15

## 2014-09-24 MED ORDER — LIDOCAINE HCL (PF) 1 % IJ SOLN
INTRAMUSCULAR | Status: DC | PRN
  Administered 2014-09-24: 6 mL

## 2014-09-24 MED ORDER — LIDOCAINE HCL 2 % EX GEL
CUTANEOUS | Status: DC | PRN
  Administered 2014-09-24: 1

## 2014-09-24 MED ORDER — BUTAMBEN-TETRACAINE-BENZOCAINE 2-2-14 % EX AERO: 1.0000 | INHALATION_SPRAY | Freq: Once | CUTANEOUS | Status: DC

## 2014-09-24 MED ORDER — LIDOCAINE HCL 2 % EX GEL: 1.0000 "application " | Freq: Once | CUTANEOUS | Status: DC

## 2014-09-24 NOTE — Op Note (Signed)
PCCM Video Bronchoscopy Procedure Note  The patient was informed of the risks (including but not limited to bleeding, infection, respiratory failure, lung injury, tooth/oral injury) and benefits of the procedure and gave consent, see chart.  Indication: pulmonary nodules, bronchiectasis  Post Procedure Diagnosis:  Pulmonary nodules, bronchiectasis  Location: [facility]  Condition pre procedure: Stable  Medications for procedure: Versed 2mg  IV, Fentanyl 64mcg IV  Procedure description: The bronchoscope was introduced through the nose and passed to the bilateral lungs to the level of the subsegmental bronchi throughout the tracheobronchial tree.  Airway exam revealed normal larynx appearance and function, normal trachea, sharp carina.  The tracheobronchial tree bilaterally was normal in appearance without endobronchial lesion or mass.  Procedures performed:  1) BAL RUL posterior segment 2) BAL RML lateral segment  Specimens sent:  1) Right upper lobe BAL> bacterial, fungal, AFB culture 2) Right middle lobe BAL> bacterial, fungal, AFB culture  Condition post procedure:  1) Stable  EBL:  < 5cc  Complications: none immediate  Roselie Awkward, MD [place] [facility] Pager: [phone] Cell: [phone] After 3pm or if no response, call [phone]

## 2014-09-24 NOTE — Progress Notes (Signed)
Respiratory care note- Pt tolerated procedure well.  Vital signs stable through out procedure.  2 washings performed during bronch procedure. No complications noted.  Report called to endo nurse.  Pt transferred to endo for recovery.

## 2014-09-24 NOTE — Discharge Instructions (Signed)
Flexible Bronchoscopy, Care After These instructions give you information on caring for yourself after your procedure. Your doctor may also give you more specific instructions. Call your doctor if you have any problems or questions after your procedure. HOME CARE  Do not eat or drink anything for 2 hours after your procedure. If you try to eat or drink before the medicine wears off, food or drink could go into your lungs. You could also burn yourself.  After 2 hours have passed and when you can cough and gag normally, you may eat soft food and drink liquids slowly.  The day after the test, you may eat your normal diet.  You may do your normal activities.  Keep all doctor visits. GET HELP RIGHT AWAY IF:  You get more and more short of breath.  You get light-headed.  You feel like you are going to pass out (faint).  You have chest pain.  You have new problems that worry you.  You cough up more than a little blood.  You cough up more blood than before. MAKE SURE YOU:  Understand these instructions.  Will watch your condition.  Will get help right away if you are not doing well or get worse. Document Released: 10/26/2008 Document Revised: 01/03/2013 Document Reviewed: 09/02/2012 [facility] Patient Information 2015 [place], [place]. This information is not intended to replace advice given to you by your health care provider. Make sure you discuss any questions you have with your health care provider.  YOU MAY DRINK FLUIDS AT 1:35

## 2014-09-24 NOTE — H&P (Signed)
  [facility] PCCM H&P  HPI: David Cantrell is my office patient and has pulmonary nodules and bronchiectasis.  He may have an atypical lung infection but couldn't cough up any mucus for [place] to send for culture.  He is here today for a bronchoscopy for the same.  Past Medical History  Diagnosis Date  . Hypertension   . Hyperlipidemia   . CAD (coronary artery disease)     CARDIOLOGIST--  DR Wynonia Lawman  . Seasonal allergies   . Right nephrolithiasis   . Right ureteral stone   . COPD (chronic obstructive pulmonary disease)   . First degree heart block   . History of adenomatous polyp of colon     HIGH GRADE SIGMOID DYSPLASIA  S/P POLYPECTOMY  . Open-angle glaucoma     RIGHT EYE ONLY  . Exercise-induced asthma   . At risk for sleep apnea     STOP-BANG = 4  SENT TO PCP 02-09-2013     Family History  Problem Relation Age of Onset  . Stroke Father     in 65s  . Colon cancer Father     upper 5's  . Heart attack Brother 46    stent  . Cancer Brother     pre cancerous bladder lesion  . Diabetes Paternal Uncle   . Arthritis Neg Hx   . Asthma Neg Hx   . COPD Neg Hx   . Rectal cancer Neg Hx   . Stomach cancer Neg Hx      Social History   Social History  . Marital Status: Married    Spouse Name: N/A  . Number of Children: N/A  . Years of Education: N/A   Occupational History  . Not on file.   Social History Main Topics  . Smoking status: Former Smoker -- 2.00 packs/day for 3 years    Types: Cigarettes    Quit date: 01/12/1970  . Smokeless tobacco: Never Used     Comment: smoked [employer], up to 2 ppd  . Alcohol Use: No  . Drug Use: No  . Sexual Activity: Not on file   Other Topics Concern  . Not on file   Social History Narrative     Allergies  Allergen Reactions  . Lipitor [Atorvastatin] Other (See Comments)    MUSCLE ACHES  . Morphine And Related Other (See Comments)    Severe headache  . Niacin And Related Other (See Comments)    FLUSHING  . Ramipril Other (See  Comments)    COUGH  . Sulfa Antibiotics Hives  . Codeine Other (See Comments)    CONSTIPATION     @encmedstart @   Filed Vitals:   09/24/14 1025  BP: 137/91  Pulse: 53  Temp:   Resp: 11   Gen: well appearing HENT: OP clear, TM's clear, neck supple PULM: CTA B, normal percussion CV: RRR, no mgr, trace edema GI: BS+, soft, nontender Derm: no cyanosis or rash Psyche: normal mood and affect  CT images reviewed again> multiple pulmonary nodules in the right lung, largest RUL, posterior segment vs RML lateral segment.  Impresion/Plan: Pulmonary nodules> needs CBC stat, afterwards plan bronchoscopy with BAL  Roselie Awkward, MD [place] [facility] Pager: [phone] Cell: [phone] After 3pm or if no response, call [phone]

## 2014-09-26 ENCOUNTER — Encounter (HOSPITAL_COMMUNITY): Payer: Self-pay | Admitting: Pulmonary Disease

## 2014-09-27 LAB — CULTURE, RESPIRATORY W GRAM STAIN: Gram Stain: NONE SEEN

## 2014-09-27 LAB — CULTURE, RESPIRATORY: GRAM STAIN: NONE SEEN

## 2014-10-03 ENCOUNTER — Encounter: Payer: Self-pay | Admitting: Surgery

## 2014-10-03 ENCOUNTER — Institutional Professional Consult (permissible substitution) (INDEPENDENT_AMBULATORY_CARE_PROVIDER_SITE_OTHER): Payer: PPO | Admitting: Surgery

## 2014-10-03 VITALS — BP 122/80 | HR 56 | Resp 16 | Ht 73.0 in | Wt 235.0 lb

## 2014-10-03 DIAGNOSIS — I712 Thoracic aortic aneurysm, without rupture, unspecified: Secondary | ICD-10-CM

## 2014-10-03 NOTE — Progress Notes (Signed)
Cardiothoracic Surgery Consultation   PCP is Unice Cobble, MD Referring Provider is Hendricks Limes, MD Cardiologist: Tollie Eth, MD Chief Complaint  Patient presents with  . TAA    eaval with CT CHEST 09/05/14    HPI:  The patient is a 74 year old gentleman with a history of hypertension, hyperlipidemia, and coronary disease who was recently evaluated by Dr. Lake Bells for a few month history of dyspnea on exertion and wheezing and a CT scan of the chest showing multiple pulmonary nodules, one of which was 1.1 cm. He is being worked up for possible MAI. His chest CT showed bronchiectasis and scattered centrilobular emphysema. His CT scan also showed fusiform enlargement of the ascending aorta to 4.4 cm with the sinuses measuring 4.3 cm and the descending aorta 3.0 cm. This was unchanged from his last CT of the chest done on 07/03/2013. He has a CT of the chest in 2003 and 2004 for chest pain that ruled out PE. There was no mention of the aorta and I can not access the films from that long ago.   Past Medical History  Diagnosis Date  . Hypertension   . Hyperlipidemia   . CAD (coronary artery disease)     CARDIOLOGIST--  DR Wynonia Lawman  . Seasonal allergies   . Right nephrolithiasis   . Right ureteral stone   . COPD (chronic obstructive pulmonary disease)   . First degree heart block   . History of adenomatous polyp of colon     HIGH GRADE SIGMOID DYSPLASIA  S/P POLYPECTOMY  . Open-angle glaucoma     RIGHT EYE ONLY  . Exercise-induced asthma   . At risk for sleep apnea     STOP-BANG = 4  SENT TO PCP 02-09-2013    Past Surgical History  Procedure Laterality Date  . Esi      ; for cervical radiculopathy  . Cystoscopy with retrograde pyelogram, ureteroscopy and stent placement Right 02/02/2013    Procedure: CYSTOSCOPY WITH RETROGRADE PYELOGRAM, AND Right STENT PLACEMENT;  Surgeon: Molli Hazard, MD;  Location: [facility];  Service: Urology;  Laterality: Right;  .  Colonoscopy w/ polypectomy  x5  . Hemorrhoid surgery  07-03-1999  . Cardiac catheterization  09-04-2002  DR TILLEY    NORMAL LVF/  MILD TO MODERATE CAD INVOLVING PROXIMAL 40%  AND MID 40%  LAD/  POSTERIOR DESCENDING CX 70%  . Cardiac catheterization  06-05-2010  DR TILEY    NORMAL LM/  40% PROXIMAL MID LAD/ 70% DISTAL CFX/  80% PROXIMAL PDA/  SMALL & NONDOMINANT RCA  . Shoulder open rotator cuff repair Bilateral RIGHT X2  LAST ONE 1997/  LEFT 2005  . Cystoscopy with retrograde pyelogram, ureteroscopy and stent placement Right 02/15/2013    Procedure: CYSTOSCOPY WITH RETROGRADE PYELOGRAM, URETEROSCOPY AND STENT EXCHANGE;  Surgeon: Molli Hazard, MD;  Location: [facility];  Service: Urology;  Laterality: Right;  . Holmium laser application Right 04/17/8590    Procedure: HOLMIUM LASER APPLICATION;  Surgeon: Molli Hazard, MD;  Location: [facility];  Service: Urology;  Laterality: Right;  . Video bronchoscopy Bilateral 09/24/2014    Procedure: VIDEO BRONCHOSCOPY WITH FLUORO;  Surgeon: Juanito Doom, MD;  Location: [place];  Service: Cardiopulmonary;  Laterality: Bilateral;    Family History  Problem Relation Age of Onset  . Stroke Father     in 66s  . Colon cancer Father     upper 66's  . Heart  attack Brother 59    stent  . Cancer Brother     pre cancerous bladder lesion  . Diabetes Paternal Uncle   . Arthritis Neg Hx   . Asthma Neg Hx   . COPD Neg Hx   . Rectal cancer Neg Hx   . Stomach cancer Neg Hx     Social History Social History  Substance Use Topics  . Smoking status: Former Smoker -- 2.00 packs/day for 3 years    Types: Cigarettes    Quit date: 01/12/1970  . Smokeless tobacco: Never Used     Comment: smoked [employer], up to 2 ppd  . Alcohol Use: No    Current Outpatient Prescriptions  Medication Sig Dispense Refill  . albuterol (PROVENTIL HFA;VENTOLIN HFA) 108 (90 BASE) MCG/ACT inhaler Inhale 2 puffs into the lungs  every 6 (six) hours as needed for wheezing or shortness of breath. 1 Inhaler 2  . aspirin EC 81 MG tablet Take 81 mg by mouth daily. PER DR TILLEY DO NOT STOP FOR PROCEDURE    . Brinzolamide-Brimonidine (SIMBRINZA) 1-0.2 % SUSP Place 1 drop into the right eye 2 (two) times daily.    . Coenzyme Q10 (COQ10) 100 MG CAPS Take 1 capsule by mouth daily.    . dorzolamide-timolol (COSOPT) 22.3-6.8 MG/ML ophthalmic solution Place 1 drop into the right eye 2 (two) times daily.    . fenofibrate micronized (LOFIBRA) 200 MG capsule TAKE ONE CAPSULE BY MOUTH ONCE DAILY WITH BREAKFAST 30 capsule 0  . Flaxseed, Linseed, 1300 MG CAPS Take by mouth 2 (two) times daily with a meal.    . Fluticasone-Salmeterol (ADVAIR DISKUS) 250-50 MCG/DOSE AEPB Inhale 1 puff into the lungs 2 (two) times daily. (Patient taking differently: Inhale 1 puff into the lungs 2 (two) times daily as needed. ) 14 each 0  . GARLIC PO Take 1 capsule by mouth daily.    Marland Kitchen ibuprofen (ADVIL,MOTRIN) 200 MG tablet Take 800 mg by mouth every 6 (six) hours as needed.    . metoprolol tartrate (LOPRESSOR) 25 MG tablet TAKE ONE-HALF TABLET BY MOUTH TWICE DAILY 90 tablet 1  . Multiple Vitamins-Minerals (CENTRUM SILVER PO) Take 1 capsule by mouth daily.    Marland Kitchen NITROSTAT 0.4 MG SL tablet Place 0.4 mg under the tongue every 5 (five) minutes as needed.     . Omega-3 Fatty Acids (FISH OIL) 1200 MG CAPS Take 1 capsule by mouth 2 (two) times daily. 1 by mouth daily    . Respiratory Therapy Supplies (FLUTTER) DEVI Use as directed 1 each 0  . rosuvastatin (CRESTOR) 10 MG tablet Take 10 mg by mouth 2 (two) times a week. Monday and thursday    . senna-docusate (SENOKOT S) 8.6-50 MG per tablet Take 1 tablet by mouth 2 (two) times daily. (Patient taking differently: Take 1 tablet by mouth 2 (two) times daily as needed. ) 60 tablet 0   Current Facility-Administered Medications  Medication Dose Route Frequency Provider Last Rate Last Dose  . pneumococcal 13-valent  conjugate vaccine (PREVNAR 13) injection 0.5 mL  0.5 mL Intramuscular Tomorrow-1000 Hendricks Limes, MD        Allergies  Allergen Reactions  . Lipitor [Atorvastatin] Other (See Comments)    MUSCLE ACHES  . Morphine And Related Other (See Comments)    Severe headache  . Niacin And Related Other (See Comments)    FLUSHING  . Ramipril Other (See Comments)    COUGH  . Sulfa Antibiotics Hives  . Codeine Other (  See Comments)    CONSTIPATION    Review of Systems  Constitutional: Positive for fever, activity change, appetite change and fatigue. Negative for chills and diaphoresis.  HENT: Positive for hearing loss.   Eyes:       Blurry vision, floaters  Respiratory: Positive for apnea, cough and shortness of breath.   Cardiovascular: Positive for chest pain. Negative for leg swelling.  Gastrointestinal: Positive for constipation.       Reflux and frequent heartburn recently  Endocrine: Negative.   Genitourinary:       BPH, Kidney stones  Musculoskeletal: Negative.   Skin: Negative.   Allergic/Immunologic: Negative.   Neurological: Positive for headaches.  Hematological: Negative.   Psychiatric/Behavioral: The patient is nervous/anxious.     BP 122/80 mmHg  Pulse 56  Resp 16  Ht 6\' 1"  (1.854 m)  Wt 235 lb (106.595 kg)  BMI 31.01 kg/m2  SpO2 97% Physical Exam  Constitutional: He is oriented to person, place, and time. He appears well-developed and well-nourished. No distress.  HENT:  Head: Normocephalic.  Mouth/Throat: Oropharynx is clear and moist.  Eyes: EOM are normal. Pupils are equal, round, and reactive to light.  Neck: Normal range of motion. Neck supple. No JVD present. No thyromegaly present.  Cardiovascular: Normal rate, regular rhythm, normal heart sounds and intact distal pulses.   No murmur heard. Pulmonary/Chest: Effort normal and breath sounds normal. No respiratory distress. He has no wheezes. He has no rales. He exhibits no tenderness.  Abdominal: Soft.  Bowel sounds are normal. He exhibits no distension and no mass. There is no tenderness.  Musculoskeletal: Normal range of motion. He exhibits no edema.  Lymphadenopathy:    He has no cervical adenopathy.  Neurological: He is alert and oriented to person, place, and time. He has normal strength. No cranial nerve deficit or sensory deficit.  Skin: Skin is warm and dry.  Psychiatric: He has a normal mood and affect.     Diagnostic Tests:  CLINICAL DATA: Shortness of breath. Evaluate pulmonary nodules.  EXAM: CT CHEST WITHOUT CONTRAST  TECHNIQUE: Multidetector CT imaging of the chest was performed following the standard protocol without IV contrast.  COMPARISON: Chest CT 07/03/2013  FINDINGS: No significant chest lymphadenopathy. There are coronary artery calcifications. The ascending thoracic aorta is enlarged. The mid ascending thoracic aorta measures up to 4.4 cm, previously measured 4.3 cm. The sinuses of Valsalva at the aortic root roughly measures 4.3 cm. Descending thoracic aorta measures 3.0 cm and stable.  Again noted is a large cyst involving the right kidney upper pole measuring up to 5.9 cm. No acute abnormality in the upper abdomen. There is no significant pericardial or pleural fluid.  The trachea and mainstem bronchi are patent. There is centrilobular emphysema which is similar to the previous examination. Stable punctate peripheral nodule the medial right upper lobe on sequence 3, image 16. New small ill-defined nodular densities in the posterior right upper lobe on sequence 3, image 23. Stable punctate nodule in the right upper lobe on sequence 3, image 28. Stable punctate pleural-based nodule in right middle lobe on image 47. Stable small nodules in the right lower lobe on images 57 and 54. Stable 6 mm nodule along the pleural surface of the right lower lobe on image 51. Stable 7 mm nodule in right lower lobe on image 47. Additional small stable  nodules in the right lower lobe. There is a new irregular nodular density along the medial right lower lobe on image 34 measuring  7 mm. New irregular nodular density in the right upper lobe adjacent to the fissures on image 37 measuring up to 1.1 cm. Stable nodule in the left upper lobe on image 34. Additional small stable nodules in the left upper lobe. Stable 7 mm nodule in left lower lobe on image 47. Probable stable nodule in left lower lobe on image 48. There is no significant airspace disease in the left lung.  Surgical screw in the right scapula. No acute bone abnormality.  IMPRESSION: Numerous pulmonary nodules. Many of the nodules are unchanged but there are new nodules and subtle parenchymal disease in the right lung. The most prominent lesion is a 1.1 cm irregular nodule in the right upper lobe. This could represent infectious or inflammatory changes. Recommend follow-up chest CT in 3 months to ensure resolution or stability.  Emphysema.  Aneurysm of the ascending thoracic aorta measuring up to 4.4 cm. Recommend annual imaging followup by CTA or MRA. This recommendation follows 2010 ACCF/AHA/AATS/ACR/ASA/SCA/SCAI/SIR/STS/SVM Guidelines for the Diagnosis and Management of Patients with Thoracic Aortic Disease. Circulation. 2010; 121: W620-B559  Coronary artery calcifications.   Electronically Signed  By: Markus Daft M.D.  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 His descending aorta is 3.0 cm and he is a large frame man so this is not as significant. He does not require any treatment for this at this time but will need a follow up scan in one year. His BP appears to be well-controlled and he is on a beta blocker.  I am more concerned about the possibility of significant coronary ischemia in this patient. He reports progressive exertional dyspnea over the past few months  as well as some left sided chest discomfort radiating down his left arm to the elbow with numbness and tingling into his fingers. He had a cath in 05/2010 that showed significant stenosis of the PDA with mild left main and LAD disease and has calcifications noted in the LAD on CT scan. He had a Lexiscan in 05/2013 that was normal. I think he should follow up with Dr. Wynonia Lawman concerning these symptoms.   Plan:  I will see him back in one year with a CTA of the chest to follow up on his ascending aortic aneurysm.  Gaye Pollack, MD [facility] [phone]

## 2014-10-15 ENCOUNTER — Encounter: Payer: Self-pay | Admitting: Cardiology

## 2014-10-16 ENCOUNTER — Encounter: Payer: Self-pay | Admitting: Pulmonary Disease

## 2014-10-16 NOTE — Telephone Encounter (Signed)
BQ please advise. Pt is aware that BQ is on vacation this week.

## 2014-10-18 ENCOUNTER — Encounter: Payer: Self-pay | Admitting: Internal Medicine

## 2014-10-18 NOTE — Telephone Encounter (Signed)
Please advise 

## 2014-10-21 LAB — FUNGUS CULTURE W SMEAR
Fungal Smear: NONE SEEN
Fungal Smear: NONE SEEN

## 2014-10-23 ENCOUNTER — Encounter: Payer: Self-pay | Admitting: Pulmonary Disease

## 2014-11-06 LAB — AFB CULTURE WITH SMEAR (NOT AT ARMC)
ACID FAST SMEAR: NONE SEEN
Acid Fast Smear: NONE SEEN

## 2014-11-20 ENCOUNTER — Encounter: Payer: Self-pay | Admitting: Gastroenterology

## 2014-11-26 ENCOUNTER — Other Ambulatory Visit: Payer: Self-pay | Admitting: Internal Medicine

## 2014-12-05 ENCOUNTER — Ambulatory Visit (INDEPENDENT_AMBULATORY_CARE_PROVIDER_SITE_OTHER)
Admission: RE | Admit: 2014-12-05 | Discharge: 2014-12-05 | Disposition: A | Discharge status: Home / Self Care | Payer: PPO | Source: Ambulatory Visit | Attending: Pulmonary Disease | Admitting: Pulmonary Disease

## 2014-12-05 DIAGNOSIS — R918 Other nonspecific abnormal finding of lung field: Secondary | ICD-10-CM

## 2014-12-10 NOTE — Progress Notes (Signed)
Quick Note:  Contacted pt with results per BQ Pt expressed understanding, nothing further needed ______ 

## 2014-12-11 ENCOUNTER — Ambulatory Visit (INDEPENDENT_AMBULATORY_CARE_PROVIDER_SITE_OTHER): Payer: PPO | Admitting: Pulmonary Disease

## 2014-12-11 ENCOUNTER — Encounter: Payer: Self-pay | Admitting: Pulmonary Disease

## 2014-12-11 VITALS — BP 136/76 | HR 77 | Ht 73.0 in | Wt 235.0 lb

## 2014-12-11 DIAGNOSIS — J479 Bronchiectasis, uncomplicated: Secondary | ICD-10-CM

## 2014-12-11 DIAGNOSIS — R918 Other nonspecific abnormal finding of lung field: Secondary | ICD-10-CM | POA: Diagnosis not present

## 2014-12-11 LAB — PULMONARY FUNCTION TEST
DL/VA % PRED: 64 %
DL/VA: 3.03 ml/min/mmHg/L
DLCO unc % pred: 61 %
DLCO unc: 21.6 ml/min/mmHg
FEF 25-75 POST: 2.49 L/s
FEF 25-75 PRE: 1.82 L/s
FEF2575-%Change-Post: 36 %
FEF2575-%PRED-PRE: 72 %
FEF2575-%Pred-Post: 99 %
FEV1-%Change-Post: 5 %
FEV1-%PRED-PRE: 95 %
FEV1-%Pred-Post: 100 %
FEV1-PRE: 3.25 L
FEV1-Post: 3.42 L
FEV1FVC-%Change-Post: 0 %
FEV1FVC-%PRED-PRE: 95 %
FEV6-%CHANGE-POST: 3 %
FEV6-%PRED-POST: 108 %
FEV6-%PRED-PRE: 104 %
FEV6-POST: 4.74 L
FEV6-Pre: 4.57 L
FEV6FVC-%CHANGE-POST: 0 %
FEV6FVC-%PRED-POST: 104 %
FEV6FVC-%Pred-Pre: 104 %
FVC-%Change-Post: 4 %
FVC-%PRED-POST: 104 %
FVC-%Pred-Pre: 100 %
FVC-PRE: 4.66 L
FVC-Post: 4.86 L
POST FEV6/FVC RATIO: 99 %
PRE FEV1/FVC RATIO: 70 %
Post FEV1/FVC ratio: 70 %
Pre FEV6/FVC Ratio: 98 %

## 2014-12-11 NOTE — Patient Instructions (Signed)
We will order a lung function testing call you with the results Keep taking Advair We will order a CT scan in 6 months and see you after that  Use David Cantrell Med rinses with distilled water at least twice per day using the instructions on the package. 1/2 hour after using the [facility] Med rinse, use Nasacort two puffs in each nostril once per day.  Remember that the Nasacort can take 1-2 weeks to work after regular use. Use generic zyrtec (cetirizine) every day.  If this doesn't help, then stop taking it and use chlorpheniramine-phenylephrine combination tablets.

## 2014-12-11 NOTE — Progress Notes (Signed)
Subjective:    Patient ID: David Cantrell, male    DOB: March 26, 1940, 74 y.o.   MRN: [id number]   Synopsis: idiopathic bronchiectasis, emphysema. 09/05/2014 CT chest images personally reviewed> mild bronchiectasis in the right middle lobe and the lower lobes bilaterally with associated scattered nodules and mild centrilobular emphysema 08/2014 Alpha 1 pheno MM 08/2014 CF genetic screen negative 01/02/13 multiple bibasilar pulmonary nodules noted incidentally on CAT scan done to assess renal calculi. The largest nodules 8 mm He has a smoking history 1969-1972, up to 2 packs per day. 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 He also worked in [employer].   Past history of exercise-induced bronchospasm. There is no family history of asthma, bronchitis, tuberculosis, or lung cancer. 11/2014 CT chest> RUL nodule resolved, stable nodule RML with emphysema, they recommend f/u in 6 months.  HPI Chief Complaint  Patient presents with  . Follow-up    review CT 12/05/14.  c/o increased DOE.  denies any mucus production.     Wilmer has been doing OK He has some shortness of breath that has been worse with the rain today.  He feels like the air won't go all the way in and he can't get as deep enough of a breath. He has a loud upper airway wheeze form time to time. He has been having sinus drainage from time to time as well.  He does feel congestion, just facial pressure from time to time.  He does have sinus drainage.  He used to take allegra, now on OTC cetirizine. 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  Past Medical History  Diagnosis Date  . Hypertension   . Hyperlipidemia   . CAD (coronary artery disease)     CARDIOLOGIST--  DR Wynonia Lawman  . Seasonal allergies   . Right nephrolithiasis   . Right ureteral stone   . COPD (chronic obstructive pulmonary disease) ([place])   . First degree heart block   . History of adenomatous polyp of colon     HIGH  GRADE SIGMOID DYSPLASIA  S/P POLYPECTOMY  . Open-angle glaucoma     RIGHT EYE ONLY  . Exercise-induced asthma   . At risk for sleep apnea     STOP-BANG = 4  SENT TO PCP 02-09-2013      Review of Systems  Constitutional: Negative for fever, chills and fatigue.  HENT: Positive for postnasal drip, rhinorrhea and sinus pressure. Negative for nosebleeds.   Respiratory: Positive for shortness of breath and wheezing. Negative for cough.   Cardiovascular: Negative for chest pain, palpitations and leg swelling.       Objective:   Physical Exam Filed Vitals:   12/11/14 1353  BP: 136/76  Pulse: 77  Height: 6\' 1"  (1.854 m)  Weight: 235 lb (106.595 kg)  SpO2: 95%  RA  Gen: well appearing HENT: OP clear, TM's clear, neck supple PULM: CTA B, normal percussion CV: RRR, no mgr, trace edema GI: BS+, soft, nontender Derm: no cyanosis or rash Psyche: normal mood and affect   CT chest images today personally reviewed in clinic showing mild bronchiectasis bilaterally, mild centrilobular emphysema, and stable nodules Microbiology results from the bronchoscopy earlier this year reviewed again today in clinic showing no infectious pathogen of any kind        Assessment & Plan:  Bronchiectasis without acute exacerbation ([place]) He has bronchiectasis which is most likely related to multiple episodes of pneumonia as an adult. He has not had an exacerbation.  Flu shot is up-to-date  Plan: Pulmonary function test given dyspnea Continue Advair for now Follow up 6 months  Multiple pulmonary nodules The most recent CT chest showed stability of some nodules but then growth of others. I believe that these are inflammatory changes related to his bronchiectasis. Fortunately his bronchoscopy results of not show evidence of a pulmonary infection.  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  albuterol (PROVENTIL HFA;VENTOLIN HFA) 108 (90 BASE) MCG/ACT inhaler, Inhale 2 puffs into the lungs every 6 (six) hours as needed for wheezing or shortness of breath., Disp: 1 Inhaler, Rfl: 2 .  aspirin EC 81 MG tablet, Take 81 mg by mouth daily. PER DR TILLEY DO NOT STOP FOR PROCEDURE, Disp: , Rfl:  .  Brinzolamide-Brimonidine (SIMBRINZA) 1-0.2 % SUSP, Place 1 drop into the right eye 2 (two) times daily., Disp: , Rfl:  .  Coenzyme Q10 (COQ10) 100 MG CAPS, Take 1 capsule by mouth daily., Disp: , Rfl:  .  dorzolamide-timolol (COSOPT) 22.3-6.8 MG/ML ophthalmic solution, Place 1 drop into the right eye 2 (two) times daily., Disp: , Rfl:  .  fenofibrate micronized (LOFIBRA) 200 MG capsule, TAKE ONE CAPSULE BY MOUTH ONCE DAILY WITH BREAKFAST, Disp: 30 capsule, Rfl: 0 .  Flaxseed, Linseed, 1300 MG CAPS, Take by mouth 2 (two) times daily with a meal., Disp: , Rfl:  .  Fluticasone-Salmeterol (ADVAIR DISKUS) 250-50 MCG/DOSE AEPB, Inhale 1 puff into the lungs 2 (two) times daily. (Patient taking differently: Inhale 1 puff into the lungs 2 (two) times daily as needed. ), Disp: 14 each, Rfl: 0 .  GARLIC PO, Take 1 capsule by mouth daily., Disp: , Rfl:  .  ibuprofen (ADVIL,MOTRIN) 200 MG tablet, Take 800 mg by mouth every 6 (six) hours as needed., Disp: , Rfl:  .  metoprolol tartrate (LOPRESSOR) 25 MG tablet, TAKE ONE-HALF TABLET BY MOUTH TWICE DAILY, Disp: 90 tablet, Rfl: 0 .  Multiple Vitamins-Minerals (CENTRUM SILVER PO), Take 1 capsule by mouth daily., Disp: , Rfl:  .  NITROSTAT 0.4 MG SL tablet, Place 0.4 mg under the tongue every 5 (five) minutes as needed. , Disp: , Rfl:  .  Omega-3 Fatty Acids (FISH OIL) 1200 MG CAPS, Take 1 capsule by mouth 2 (two) times daily. 1 by mouth daily, Disp: , Rfl:  .  Respiratory Therapy Supplies (FLUTTER) DEVI, Use as directed, Disp: 1 each, Rfl: 0 .  rosuvastatin (CRESTOR) 10 MG tablet, Take 10 mg by mouth 2  (two) times a week. Monday and thursday, Disp: , Rfl:  .  senna-docusate (SENOKOT S) 8.6-50 MG per tablet, Take 1 tablet by mouth 2 (two) times daily. (Patient taking differently: Take 1 tablet by mouth 2 (two) times daily as needed. ), Disp: 60 tablet, Rfl: 0  Current facility-administered medications:  .  pneumococcal 13-valent conjugate vaccine (PREVNAR 13) injection 0.5 mL, 0.5 mL, Intramuscular, Tomorrow-1000, Hendricks Limes, MD

## 2014-12-11 NOTE — Assessment & Plan Note (Signed)
The most recent CT chest showed stability of some nodules but then growth of others. I believe that these are inflammatory changes related to his bronchiectasis. Fortunately his bronchoscopy results of not show evidence of a pulmonary infection.  Plan: Repeat CT chest in 6 months

## 2014-12-11 NOTE — Progress Notes (Signed)
PFT done today. 

## 2014-12-11 NOTE — Assessment & Plan Note (Signed)
He has bronchiectasis which is most likely related to multiple episodes of pneumonia as an adult. He has not had an exacerbation.  Flu shot is up-to-date  Plan: Pulmonary function test given dyspnea Continue Advair for now Follow up 6 months

## 2014-12-12 ENCOUNTER — Encounter: Payer: Self-pay | Admitting: Pulmonary Disease

## 2014-12-12 DIAGNOSIS — J432 Centrilobular emphysema: Secondary | ICD-10-CM | POA: Insufficient documentation

## 2014-12-13 ENCOUNTER — Telehealth: Payer: Self-pay | Admitting: Pulmonary Disease

## 2014-12-13 NOTE — Telephone Encounter (Signed)
I spoke with patient about results and he verbalized understanding and had no questions 

## 2014-12-13 NOTE — Progress Notes (Signed)
Quick Note:  LVM for patient to return call. ______ 

## 2014-12-13 NOTE — Telephone Encounter (Signed)
[phone], pt cb

## 2014-12-13 NOTE — Telephone Encounter (Signed)
Per PFT result note: Please let him know that these were very normal with the exception of some very mild changes due to the emphysema we discussed in his last visit. ---  [id number] x1

## 2014-12-15 ENCOUNTER — Encounter: Payer: Self-pay | Admitting: Pulmonary Disease

## 2015-01-22 DIAGNOSIS — I25119 Atherosclerotic heart disease of native coronary artery with unspecified angina pectoris: Secondary | ICD-10-CM | POA: Diagnosis not present

## 2015-01-22 DIAGNOSIS — E668 Other obesity: Secondary | ICD-10-CM | POA: Diagnosis not present

## 2015-01-22 DIAGNOSIS — E785 Hyperlipidemia, unspecified: Secondary | ICD-10-CM | POA: Diagnosis not present

## 2015-03-01 ENCOUNTER — Other Ambulatory Visit: Payer: Self-pay | Admitting: Internal Medicine

## 2015-03-07 DIAGNOSIS — L570 Actinic keratosis: Secondary | ICD-10-CM | POA: Diagnosis not present

## 2015-03-07 DIAGNOSIS — Z85828 Personal history of other malignant neoplasm of skin: Secondary | ICD-10-CM | POA: Diagnosis not present

## 2015-03-07 DIAGNOSIS — D1801 Hemangioma of skin and subcutaneous tissue: Secondary | ICD-10-CM | POA: Diagnosis not present

## 2015-03-07 DIAGNOSIS — L821 Other seborrheic keratosis: Secondary | ICD-10-CM | POA: Diagnosis not present

## 2015-03-07 DIAGNOSIS — L812 Freckles: Secondary | ICD-10-CM | POA: Diagnosis not present

## 2015-03-15 ENCOUNTER — Encounter: Payer: Self-pay | Admitting: Internal Medicine

## 2015-03-15 ENCOUNTER — Ambulatory Visit (INDEPENDENT_AMBULATORY_CARE_PROVIDER_SITE_OTHER): Payer: PPO | Admitting: Internal Medicine

## 2015-03-15 VITALS — BP 104/74 | HR 57 | Temp 98.0°F | Resp 16 | Wt 236.0 lb

## 2015-03-15 DIAGNOSIS — I2510 Atherosclerotic heart disease of native coronary artery without angina pectoris: Secondary | ICD-10-CM | POA: Diagnosis not present

## 2015-03-15 DIAGNOSIS — I714 Abdominal aortic aneurysm, without rupture, unspecified: Secondary | ICD-10-CM | POA: Insufficient documentation

## 2015-03-15 DIAGNOSIS — I10 Essential (primary) hypertension: Secondary | ICD-10-CM

## 2015-03-15 DIAGNOSIS — E785 Hyperlipidemia, unspecified: Secondary | ICD-10-CM | POA: Diagnosis not present

## 2015-03-15 DIAGNOSIS — J432 Centrilobular emphysema: Secondary | ICD-10-CM

## 2015-03-15 NOTE — Assessment & Plan Note (Signed)
Due for CT 08/2015

## 2015-03-15 NOTE — Patient Instructions (Signed)
  We have reviewed your prior records including labs and tests today.  Test(s) ordered today. Your results will be released to MyChart (or called to you) after review, usually within 72hours after test completion. If any changes need to be made, you will be notified at that same time.  All other Health Maintenance issues reviewed.   All recommended immunizations and age-appropriate screenings are up-to-date.  No immunizations administered today.   Medications reviewed and updated.  No changes recommended at this time.        

## 2015-03-15 NOTE — Assessment & Plan Note (Signed)
BP well controlled Continue current meds Check cmp

## 2015-03-15 NOTE — Assessment & Plan Note (Signed)
Follows with pulmonary Continue current inhalers

## 2015-03-15 NOTE — Progress Notes (Signed)
Pre visit review using our clinic review tool, if applicable. No additional management support is needed unless otherwise documented below in the visit note. 

## 2015-03-15 NOTE — Progress Notes (Signed)
Subjective:    Patient ID: Gerlene Fee, male    DOB: Jun 08, 1940, 75 y.o.   MRN: [id number]  HPI He is here to establish with a new pcp.   He is here for follow up.  CAD, Hypertension: He is taking his medication daily. He is compliant with a low sodium diet.  He denies chest pain, palpitations, edema, shortness of breath and regular headaches. He is exercising regularly, but should walk more.  He does monitor his blood pressure at home and it is controlled.    Hyperlipidemia: He is taking his medication daily. He is compliant with a low fat/cholesterol diet. He is exercising regularly. He denies myalgias.   COPD:  He follows with pulmonary.  He uses the inhalers as prescribed. He is unsure how much the inhalers help.  He has daily SOB and wheeze, but denies daily cough.   CRI:  He takes advil twice a week, but daily. He drinks lot of water fluid throughout the day. He denies any change in urination, blood in the urine or difficulty urinating.    Medications and allergies reviewed with patient and updated if appropriate.  Patient Active Problem List   Diagnosis Date Noted  . Centrilobular emphysema ([place]) 12/12/2014  . Bronchiectasis without acute exacerbation ([place]) 09/11/2014  . Bulging lumbar disc 10/03/2013  . Open-angle glaucoma 05/05/2013  . 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 Multiple pulmonary nodules 02/24/2013  . Abnormal CT scan, kidney 02/24/2013  . Basal cell carcinoma of ala nasi 02/24/2013  . At risk for obstructive sleep apnea 02/09/2013  . 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 COLONIC POLYPS, HX OF 12/13/2007  . HYPERLIPIDEMIA 12/03/2006  . HYPERTENSION 12/03/2006  . C A D 12/03/2006  . 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 Nevus, non-neoplastic 09/24/2006  . RHINITIS, ALLERGIC NOS 09/24/2006  . Exercise-induced bronchospasm 09/24/2006    Current Outpatient Prescriptions on File Prior to Visit  Medication Sig Dispense  Refill  . albuterol (PROVENTIL HFA;VENTOLIN HFA) 108 (90 BASE) MCG/ACT inhaler Inhale 2 puffs into the lungs every 6 (six) hours as needed for wheezing or shortness of breath. 1 Inhaler 2  . aspirin EC 81 MG tablet Take 81 mg by mouth daily. PER DR TILLEY DO NOT STOP FOR PROCEDURE    . Brinzolamide-Brimonidine (SIMBRINZA) 1-0.2 % SUSP Place 1 drop into the right eye 2 (two) times daily.    . Coenzyme Q10 (COQ10) 100 MG CAPS Take 1 capsule by mouth daily.    . dorzolamide-timolol (COSOPT) 22.3-6.8 MG/ML ophthalmic solution Place 1 drop into the right eye 2 (two) times daily.    . fenofibrate micronized (LOFIBRA) 200 MG capsule TAKE ONE CAPSULE BY MOUTH ONCE DAILY WITH BREAKFAST 30 capsule 0  . Flaxseed, Linseed, 1300 MG CAPS Take by mouth 2 (two) times daily with a meal.    . Fluticasone-Salmeterol (ADVAIR DISKUS) 250-50 MCG/DOSE AEPB Inhale 1 puff into the lungs 2 (two) times daily. (Patient taking differently: Inhale 1 puff into the lungs 2 (two) times daily as needed. ) 14 each 0  . GARLIC PO Take 1 capsule by mouth daily.    Marland Kitchen ibuprofen (ADVIL,MOTRIN) 200 MG tablet Take 800 mg by mouth every 6 (six) hours as needed.    . metoprolol tartrate (LOPRESSOR) 25 MG tablet TAKE ONE-HALF TABLET BY MOUTH TWICE DAILY**MUST ESTAB WITH NEW PROVIDER FOR FUTURE REFILLS** 90 tablet 0  . Multiple Vitamins-Minerals (CENTRUM SILVER PO) Take 1 capsule by mouth daily.    Marland Kitchen  NITROSTAT 0.4 MG SL tablet Place 0.4 mg under the tongue every 5 (five) minutes as needed.     . Omega-3 Fatty Acids (FISH OIL) 1200 MG CAPS Take 1 capsule by mouth 2 (two) times daily. 1 by mouth daily    . Respiratory Therapy Supplies (FLUTTER) DEVI Use as directed 1 each 0  . rosuvastatin (CRESTOR) 10 MG tablet Take 10 mg by mouth 2 (two) times a week. Monday and thursday    . senna-docusate (SENOKOT S) 8.6-50 MG per tablet Take 1 tablet by mouth 2 (two) times daily. (Patient taking differently: Take 1 tablet by mouth 2 (two) times daily as  needed. ) 60 tablet 0   Current Facility-Administered Medications on File Prior to Visit  Medication Dose Route Frequency Provider Last Rate Last Dose  . pneumococcal 13-valent conjugate vaccine (PREVNAR 13) injection 0.5 mL  0.5 mL Intramuscular Tomorrow-1000 Hendricks Limes, MD        Past Medical History  Diagnosis Date  . Hypertension   . Hyperlipidemia   . CAD (coronary artery disease)     CARDIOLOGIST--  DR Wynonia Lawman  . Seasonal allergies   . Right nephrolithiasis   . Right ureteral stone   . COPD (chronic obstructive pulmonary disease) ([place])   . First degree heart block   . History of adenomatous polyp of colon     HIGH GRADE SIGMOID DYSPLASIA  S/P POLYPECTOMY  . Open-angle glaucoma     RIGHT EYE ONLY  . Exercise-induced asthma   . At risk for sleep apnea     STOP-BANG = 4  SENT TO PCP 02-09-2013    Past Surgical History  Procedure Laterality Date  . Esi      ; for cervical radiculopathy  . Cystoscopy with retrograde pyelogram, ureteroscopy and stent placement Right 02/02/2013    Procedure: CYSTOSCOPY WITH RETROGRADE PYELOGRAM, AND Right STENT PLACEMENT;  Surgeon: Molli Hazard, MD;  Location: [facility];  Service: Urology;  Laterality: Right;  . Colonoscopy w/ polypectomy  x5  . Hemorrhoid surgery  07-03-1999  . Cardiac catheterization  09-04-2002  DR TILLEY    NORMAL LVF/  MILD TO MODERATE CAD INVOLVING PROXIMAL 40%  AND MID 40%  LAD/  POSTERIOR DESCENDING CX 70%  . Cardiac catheterization  06-05-2010  DR TILEY    NORMAL LM/  40% PROXIMAL MID LAD/ 70% DISTAL CFX/  80% PROXIMAL PDA/  SMALL & NONDOMINANT RCA  . Shoulder open rotator cuff repair Bilateral RIGHT X2  LAST ONE 1997/  LEFT 2005  . Cystoscopy with retrograde pyelogram, ureteroscopy and stent placement Right 02/15/2013    Procedure: CYSTOSCOPY WITH RETROGRADE PYELOGRAM, URETEROSCOPY AND STENT EXCHANGE;  Surgeon: Molli Hazard, MD;  Location: [facility];  Service: Urology;  Laterality:  Right;  . Holmium laser application Right [id number]    Procedure: HOLMIUM LASER APPLICATION;  Surgeon: Molli Hazard, MD;  Location: [facility];  Service: Urology;  Laterality: Right;  . Video bronchoscopy Bilateral 09/24/2014    Procedure: VIDEO BRONCHOSCOPY WITH FLUORO;  Surgeon: Juanito Doom, MD;  Location: [facility];  Service: Cardiopulmonary;  Laterality: Bilateral;    Social History   Social History  . Marital Status: Married    Spouse Name: N/A  . Number of Children: N/A  . Years of Education: N/A   Social History Main Topics  . Smoking status: Former Smoker -- 2.00 packs/day for 3 years    Types: Cigarettes    Quit date: 01/12/1970  .  Smokeless tobacco: Never Used     Comment: smoked [place], up to 2 ppd  . Alcohol Use: No  . Drug Use: No  . Sexual Activity: Not Asked   Other Topics Concern  . None   Social History Narrative    Family History  Problem Relation Age of Onset  . Stroke Father     in 68s  . Colon cancer Father     upper 52's  . Heart attack Brother 20    stent  . Cancer Brother     pre cancerous bladder lesion  . Diabetes Paternal Uncle   . Arthritis Neg Hx   . Asthma Neg Hx   . COPD Neg Hx   . Rectal cancer Neg Hx   . Stomach cancer Neg Hx     Review of Systems  Constitutional: Negative for fever and chills.  Respiratory: Positive for shortness of breath (from copd, chronic and unchanged) and wheezing (from copd, almost daily). Negative for cough.   Cardiovascular: Negative for chest pain, palpitations and leg swelling.  Neurological: Negative for dizziness, light-headedness and headaches.       Objective:   Filed Vitals:   03/15/15 1544  BP: 104/74  Pulse: 57  Temp: 98 F (36.7 C)  Resp: 16   Filed Weights   03/15/15 1544  Weight: 236 lb (107.049 kg)   Body mass index is 31.14 kg/(m^2).   Physical Exam Constitutional: Appears well-developed and well-nourished. No distress.  Neck: Neck  supple. No tracheal deviation present. No thyromegaly present.  No carotid bruit. No cervical adenopathy.   Cardiovascular: Normal rate, regular rhythm and normal heart sounds.   No murmur heard.  No edema Pulmonary/Chest: Effort normal and breath sounds normal. No respiratory distress. No wheezes.       Assessment & Plan:   See Problem List for Assessment and Plan of chronic medical problems.  Due for colonoscopy - will schedule  Follow up annually  Check blood work

## 2015-03-15 NOTE — Assessment & Plan Note (Signed)
No chest pain Follows with Dr Wynonia Lawman Continue current meds Check blood work Increase exercise

## 2015-03-15 NOTE — Assessment & Plan Note (Signed)
Check lipid panel On crestor - prescribed by Dr. Wynonia Lawman

## 2015-03-18 ENCOUNTER — Encounter: Payer: Self-pay | Admitting: Internal Medicine

## 2015-03-19 ENCOUNTER — Other Ambulatory Visit (INDEPENDENT_AMBULATORY_CARE_PROVIDER_SITE_OTHER): Payer: PPO

## 2015-03-19 DIAGNOSIS — I2510 Atherosclerotic heart disease of native coronary artery without angina pectoris: Secondary | ICD-10-CM

## 2015-03-19 DIAGNOSIS — I10 Essential (primary) hypertension: Secondary | ICD-10-CM

## 2015-03-19 DIAGNOSIS — E785 Hyperlipidemia, unspecified: Secondary | ICD-10-CM | POA: Diagnosis not present

## 2015-03-19 DIAGNOSIS — J432 Centrilobular emphysema: Secondary | ICD-10-CM

## 2015-03-19 DIAGNOSIS — I714 Abdominal aortic aneurysm, without rupture, unspecified: Secondary | ICD-10-CM

## 2015-03-19 LAB — COMPREHENSIVE METABOLIC PANEL
ALT: 19 U/L (ref 0–53)
AST: 18 U/L (ref 0–37)
Albumin: 4.3 g/dL (ref 3.5–5.2)
Alkaline Phosphatase: 52 U/L (ref 39–117)
BUN: 19 mg/dL (ref 6–23)
CHLORIDE: 107 meq/L (ref 96–112)
CO2: 22 meq/L (ref 19–32)
Calcium: 9.5 mg/dL (ref 8.4–10.5)
Creatinine, Ser: 1.6 mg/dL — ABNORMAL HIGH (ref 0.40–1.50)
GFR: 45.2 mL/min — AB (ref >60.00)
GLUCOSE: 108 mg/dL — AB (ref 70–99)
POTASSIUM: 4 meq/L (ref 3.5–5.1)
Sodium: 141 mEq/L (ref 135–145)
Total Bilirubin: 0.9 mg/dL (ref 0.2–1.2)
Total Protein: 7.6 g/dL (ref 6.0–8.3)

## 2015-03-19 LAB — CBC WITH DIFFERENTIAL/PLATELET
BASOS ABS: 0 10*3/uL (ref 0.0–0.1)
Basophils Relative: 0.6 % (ref 0.0–3.0)
EOS PCT: 5.3 % — AB (ref 0.0–5.0)
Eosinophils Absolute: 0.4 10*3/uL (ref 0.0–0.7)
HCT: 45 % (ref 39.0–52.0)
HEMOGLOBIN: 15.5 g/dL (ref 13.0–17.0)
LYMPHS ABS: 2.5 10*3/uL (ref 0.7–4.0)
Lymphocytes Relative: 31.3 % (ref 12.0–46.0)
MCHC: 34.4 g/dL (ref 30.0–36.0)
MCV: 92.1 fl (ref 78.0–100.0)
MONO ABS: 0.9 10*3/uL (ref 0.1–1.0)
MONOS PCT: 11 % (ref 3.0–12.0)
NEUTROS PCT: 51.8 % (ref 43.0–77.0)
Neutro Abs: 4.2 10*3/uL (ref 1.4–7.7)
PLATELETS: 286 10*3/uL (ref 150.0–400.0)
RBC: 4.89 Mil/uL (ref 4.22–5.81)
RDW: 13.2 % (ref 11.5–15.5)
WBC: 8.1 10*3/uL (ref 4.0–10.5)

## 2015-03-19 LAB — LIPID PANEL
CHOL/HDL RATIO: 4
Cholesterol: 146 mg/dL (ref 0–200)
HDL: 35.7 mg/dL — AB (ref >39.00)
LDL CALC: 80 mg/dL (ref 0–99)
NonHDL: 110.74
TRIGLYCERIDES: 153 mg/dL — AB (ref 0.0–149.0)
VLDL: 30.6 mg/dL (ref 0.0–40.0)

## 2015-03-19 LAB — HEMOGLOBIN A1C: Hgb A1c MFr Bld: 5.6 % (ref 4.6–6.5)

## 2015-03-19 LAB — TSH: TSH: 3.15 u[IU]/mL (ref 0.35–4.50)

## 2015-03-21 ENCOUNTER — Encounter: Payer: Self-pay | Admitting: Internal Medicine

## 2015-03-26 DIAGNOSIS — H40012 Open angle with borderline findings, low risk, left eye: Secondary | ICD-10-CM | POA: Diagnosis not present

## 2015-03-26 DIAGNOSIS — H401111 Primary open-angle glaucoma, right eye, mild stage: Secondary | ICD-10-CM | POA: Diagnosis not present

## 2015-04-04 DIAGNOSIS — Q6100 Congenital renal cyst, unspecified: Secondary | ICD-10-CM | POA: Diagnosis not present

## 2015-04-04 DIAGNOSIS — N402 Nodular prostate without lower urinary tract symptoms: Secondary | ICD-10-CM | POA: Diagnosis not present

## 2015-04-04 DIAGNOSIS — N200 Calculus of kidney: Secondary | ICD-10-CM | POA: Diagnosis not present

## 2015-04-04 DIAGNOSIS — D3002 Benign neoplasm of left kidney: Secondary | ICD-10-CM | POA: Diagnosis not present

## 2015-04-04 DIAGNOSIS — Z0000 Encounter for general adult medical examination without abnormal findings: Secondary | ICD-10-CM | POA: Diagnosis not present

## 2015-04-19 ENCOUNTER — Telehealth: Payer: Self-pay | Admitting: Pulmonary Disease

## 2015-04-19 NOTE — Telephone Encounter (Signed)
Called and spoke to pt. Pt questioning why he needs another CT chest. Advised pt that he is needing a f/u CT to f/u on pulmonary nodules. Pt verbalized understanding and states he would like to cancel the CT chest d/t too much radiation exposure per pt. Pt does not have a pending appt as this was not specified in last OV note.   Will send to BQ as FYI.   Patient Instructions       We will order a lung function testing call you with the results Keep taking Advair We will order a CT scan in 6 months and see you after that  Use Milta Deiters Med rinses with distilled water at least twice per day using the instructions on the package. 1/2 hour after using the [facility] Med rinse, use Nasacort two puffs in each nostril once per day.  Remember that the Nasacort can take 1-2 weeks to work after regular use. Use generic zyrtec (cetirizine) every day.  If this doesn't help, then stop taking it and use chlorpheniramine-phenylephrine combination tablets.

## 2015-04-20 ENCOUNTER — Encounter: Payer: Self-pay | Admitting: Internal Medicine

## 2015-04-23 ENCOUNTER — Encounter: Payer: Self-pay | Admitting: Internal Medicine

## 2015-04-23 NOTE — Telephone Encounter (Signed)
Please advise 

## 2015-04-28 NOTE — Telephone Encounter (Signed)
OK, but please make sure he has an appointment with me in the next 2 months to discuss this decision.

## 2015-05-14 ENCOUNTER — Inpatient Hospital Stay: Admission: RE | Admit: 2015-05-14 | Payer: PPO | Source: Ambulatory Visit

## 2015-05-15 ENCOUNTER — Ambulatory Visit (AMBULATORY_SURGERY_CENTER): Payer: Self-pay

## 2015-05-15 VITALS — Ht 73.0 in | Wt 234.8 lb

## 2015-05-15 DIAGNOSIS — Z860100 Personal history of colon polyps, unspecified: Secondary | ICD-10-CM

## 2015-05-15 MED ORDER — SUPREP BOWEL PREP KIT 17.5-3.13-1.6 GM/177ML PO SOLN: 1.0000 | Freq: Once | ORAL | Status: DC

## 2015-05-15 NOTE — Progress Notes (Signed)
No allergies to eggs or soy No past problems with anesthesia No diet meds No home oxygen  Has email and internet; refused emmi

## 2015-05-17 DIAGNOSIS — R972 Elevated prostate specific antigen [PSA]: Secondary | ICD-10-CM | POA: Diagnosis not present

## 2015-05-21 ENCOUNTER — Encounter: Payer: Self-pay | Admitting: Internal Medicine

## 2015-05-28 ENCOUNTER — Telehealth: Payer: Self-pay | Admitting: Internal Medicine

## 2015-05-28 NOTE — Telephone Encounter (Signed)
Information passed on to [facility] May 07, 2022, CRNA

## 2015-05-29 ENCOUNTER — Ambulatory Visit (AMBULATORY_SURGERY_CENTER): Payer: PPO | Admitting: Internal Medicine

## 2015-05-29 ENCOUNTER — Encounter: Payer: Self-pay | Admitting: Internal Medicine

## 2015-05-29 VITALS — BP 121/79 | HR 50 | Temp 98.2°F | Resp 14 | Ht 73.0 in | Wt 234.0 lb

## 2015-05-29 DIAGNOSIS — D124 Benign neoplasm of descending colon: Secondary | ICD-10-CM

## 2015-05-29 DIAGNOSIS — D123 Benign neoplasm of transverse colon: Secondary | ICD-10-CM

## 2015-05-29 DIAGNOSIS — J449 Chronic obstructive pulmonary disease, unspecified: Secondary | ICD-10-CM | POA: Diagnosis not present

## 2015-05-29 DIAGNOSIS — Z86010 Personal history of colonic polyps: Secondary | ICD-10-CM

## 2015-05-29 DIAGNOSIS — I10 Essential (primary) hypertension: Secondary | ICD-10-CM | POA: Diagnosis not present

## 2015-05-29 DIAGNOSIS — K621 Rectal polyp: Secondary | ICD-10-CM | POA: Diagnosis not present

## 2015-05-29 DIAGNOSIS — D122 Benign neoplasm of ascending colon: Secondary | ICD-10-CM | POA: Diagnosis not present

## 2015-05-29 DIAGNOSIS — D128 Benign neoplasm of rectum: Secondary | ICD-10-CM

## 2015-05-29 MED ORDER — SODIUM CHLORIDE 0.9 % IV SOLN: 500.0000 mL | INTRAVENOUS | Status: DC

## 2015-05-29 NOTE — Progress Notes (Signed)
Report to PACU, RN, vss, BBS= Clear.  

## 2015-05-29 NOTE — Op Note (Signed)
[place] Patient Name: David Cantrell Procedure Date: 05/29/2015 9:28 AM MRN: [id number] Endoscopist: Jerene Bears , MD Age: 75 Referring MD:  Date of Birth: 1940-06-22 Gender: Male Procedure:                Colonoscopy Indications:              Surveillance: Personal history of adenomatous                            polyps on last colonoscopy 3 years ago Medicines:                Monitored Anesthesia Care Procedure:                Pre-Anesthesia Assessment:                           - Prior to the procedure, a History and Physical                            was performed, and patient medications and                            allergies were reviewed. The patient's tolerance of                            previous anesthesia was also reviewed. The risks                            and benefits of the procedure and the sedation                            options and risks were discussed with the patient.                            All questions were answered, and informed consent                            was obtained. Prior Anticoagulants: The patient has                            taken no previous anticoagulant or antiplatelet                            agents. ASA Grade Assessment: III - A patient with                            severe systemic disease. After reviewing the risks                            and benefits, the patient was deemed in                            satisfactory condition to undergo the procedure.  After obtaining informed consent, the colonoscope                            was passed under direct vision. Throughout the                            procedure, the patient's blood pressure, pulse, and                            oxygen saturations were monitored continuously. The                            Model CF-HQ190L [id number] scope was introduced                            through the anus and advanced to the the cecum,                           identified by appendiceal orifice and ileocecal                            valve. The colonoscopy was performed without                            difficulty. The patient tolerated the procedure                            well. The quality of the bowel preparation was                            good. The ileocecal valve, appendiceal orifice, and                            rectum were photographed. Scope In: 9:37:11 AM Scope Out: 10:07:13 AM Scope Withdrawal Time: 0 hours 27 minutes 46 seconds  Total Procedure Duration: 0 hours 30 minutes 2 seconds  Findings:                 The perianal exam findings include non-thrombosed                            external hemorrhoids.                           A 8 mm polyp was found in the ascending colon. The                            polyp was sessile. The polyp was removed with a hot                            snare. Resection and retrieval were complete.                           A 3 mm polyp was found in the ascending colon. The  polyp was sessile. The polyp was removed with a                            cold snare. Resection and retrieval were complete.                           Two sessile polyps were found in the transverse                            colon. The polyps were 5 to 12 mm in size. These                            polyps were removed with a cold snare. Resection                            and retrieval were complete.                           A 5 mm polyp was found in the descending colon. The                            polyp was sessile. The polyp was removed with a                            cold snare. Resection and retrieval were complete.                           A 5 mm polyp was found in the rectum. The polyp was                            sessile. The polyp was removed with a saline                            injection-lift technique using a hot snare. The                             polyp was removed with a cold snare. Resection and                            retrieval were complete.                           Multiple small and large-mouthed diverticula were                            found in the left colon.                           Internal hemorrhoids were found during                            retroflexion. The hemorrhoids were moderate. Complications:  No immediate complications. Estimated Blood Loss:     Estimated blood loss was minimal. Impression:               - Non-thrombosed external hemorrhoids found on                            perianal exam.                           - One 8 mm polyp in the ascending colon, removed                            with a hot snare. Resected and retrieved.                           - One 3 mm polyp in the ascending colon, removed                            with a cold snare. Resected and retrieved.                           - Two 5 to 12 mm polyps in the transverse colon,                            removed with a cold snare. Resected and retrieved.                           - One 5 mm polyp in the descending colon, removed                            with a cold snare. Resected and retrieved.                           - One 5 mm polyp in the rectum, removed with a cold                            snare and removed using injection-lift and a hot                            snare. Resected and retrieved.                           - Moderate diverticulosis in the left colon.                           - Internal hemorrhoids. Recommendation:           - Patient has a contact number available for                            emergencies. The signs and symptoms of potential                            delayed complications were discussed with the  patient. Return to normal activities tomorrow.                            Written discharge instructions were provided to the                             patient.                           - Resume previous diet.                           - Continue present medications.                           - No aspirin, ibuprofen, naproxen, or other                            non-steroidal anti-inflammatory drugs for 2 weeks                            after polyp removal.                           - Await pathology results.                           - Repeat colonoscopy is recommended for                            surveillance. The colonoscopy date will be                            determined after pathology results from today's                            exam become available for review. Jerene Bears, MD 05/29/2015 10:13:15 AM This report has been signed electronically.

## 2015-05-29 NOTE — Progress Notes (Signed)
Called to room to assist during endoscopic procedure.  Patient ID and intended procedure confirmed with present staff. Received instructions for my participation in the procedure from the performing physician.  

## 2015-05-29 NOTE — Patient Instructions (Addendum)
Colon polyps removed today. Handouts given on polyps,diverticulosis,hemorrhoids.  NO extra aspirin,ibuprofen,naproxen,Aleve,or other non-steroid anti-inflammatory for 2 weeks!!  May resume on June 1,2017. May use Tylenol as directed as needed.  Result letter in your mail in 2-3 weeks. Call us with any questions or concerns. Thank you!!  YOU HAD AN ENDOSCOPIC PROCEDURE TODAY AT [facility]:   Refer to the procedure report that was given to you for any specific questions about what was found during the examination.  If the procedure report does not answer your questions, please call your gastroenterologist to clarify.  If you requested that your care partner not be given the details of your procedure findings, then the procedure report has been included in a sealed envelope for you to review at your convenience later.  YOU SHOULD EXPECT: Some feelings of bloating in the abdomen. Passage of more gas than usual.  Walking can help get rid of the air that was put into your GI tract during the procedure and reduce the bloating. If you had a lower endoscopy (such as a colonoscopy or flexible sigmoidoscopy) you may notice spotting of blood in your stool or on the toilet paper. If you underwent a bowel prep for your procedure, you may not have a normal bowel movement for a few days.  Please Note:  You might notice some irritation and congestion in your nose or some drainage.  This is from the oxygen used during your procedure.  There is no need for concern and it should clear up in a day or so.  SYMPTOMS TO REPORT IMMEDIATELY:   Following lower endoscopy (colonoscopy or flexible sigmoidoscopy):  Excessive amounts of blood in the stool  Significant tenderness or worsening of abdominal pains  Swelling of the abdomen that is new, acute  Fever of 100F or higher   For urgent or emergent issues, a gastroenterologist can be reached at any hour by calling [phone].   DIET: Your first  meal following the procedure should be a small meal and then it is ok to progress to your normal diet. Heavy or fried foods are harder to digest and may make you feel nauseous or bloated.  Likewise, meals heavy in dairy and vegetables can increase bloating.  Drink plenty of fluids but you should avoid alcoholic beverages for 24 hours.  ACTIVITY:  You should plan to take it easy for the rest of today and you should NOT DRIVE or use heavy machinery until tomorrow (because of the sedation medicines used during the test).    FOLLOW UP: Our staff will call the number listed on your records the next business day following your procedure to check on you and address any questions or concerns that you may have regarding the information given to you following your procedure. If we do not reach you, we will leave a message.  However, if you are feeling well and you are not experiencing any problems, there is no need to return our call.  We will assume that you have returned to your regular daily activities without incident.  If any biopsies were taken you will be contacted by phone or by letter within the next 1-3 weeks.  Please call us at [phone] if you have not heard about the biopsies in 3 weeks.    SIGNATURES/CONFIDENTIALITY: You and/or your care partner have signed paperwork which will be entered into your electronic medical record.  These signatures attest to the fact that that the information above on your  After Visit Summary has been reviewed and is understood.  Full responsibility of the confidentiality of this discharge information lies with you and/or your care-partner.

## 2015-05-30 ENCOUNTER — Telehealth: Payer: Self-pay

## 2015-05-30 NOTE — Telephone Encounter (Signed)
  Follow up Call-  Call back number 05/29/2015  Post procedure Call Back phone  # [phone]  Permission to leave phone message Yes     Patient questions:  Do you have a fever, pain , or abdominal swelling? No. Pain Score  0 *  Have you tolerated food without any problems? Yes.    Have you been able to return to your normal activities? Yes.    Do you have any questions about your discharge instructions: Diet   No. Medications  No. Follow up visit  No.  Do you have questions or concerns about your Care? No.  Actions: * If pain score is 4 or above: No action needed, pain <4.

## 2015-06-03 ENCOUNTER — Encounter: Payer: Self-pay | Admitting: Internal Medicine

## 2015-06-09 ENCOUNTER — Other Ambulatory Visit: Payer: Self-pay | Admitting: Internal Medicine

## 2015-06-19 ENCOUNTER — Ambulatory Visit (INDEPENDENT_AMBULATORY_CARE_PROVIDER_SITE_OTHER): Payer: PPO | Admitting: Internal Medicine

## 2015-06-19 ENCOUNTER — Encounter: Payer: Self-pay | Admitting: Internal Medicine

## 2015-06-19 VITALS — BP 124/74 | HR 58 | Temp 98.2°F | Resp 18 | Wt 235.0 lb

## 2015-06-19 DIAGNOSIS — K047 Periapical abscess without sinus: Secondary | ICD-10-CM

## 2015-06-19 DIAGNOSIS — J0100 Acute maxillary sinusitis, unspecified: Secondary | ICD-10-CM

## 2015-06-19 MED ORDER — AMOXICILLIN-POT CLAVULANATE 875-125 MG PO TABS: 1.0000 | ORAL_TABLET | Freq: Two times a day (BID) | ORAL | Status: DC

## 2015-06-19 NOTE — Assessment & Plan Note (Addendum)
Acute sinus infection - likely bacterial With possible mild COPD exacerbation - lung sound good, so no need to steroids -- continue inhalers Start augmentin bid x 10 days otc cold medication for symptom relief if needed Call if no improvement

## 2015-06-19 NOTE — Progress Notes (Signed)
Pre visit review using our clinic review tool, if applicable. No additional management support is needed unless otherwise documented below in the visit note. 

## 2015-06-19 NOTE — Assessment & Plan Note (Signed)
Possible tooth infection augmentin started If no improvement will need to see a dentist

## 2015-06-19 NOTE — Progress Notes (Signed)
Subjective:    Patient ID: David Cantrell, male    DOB: 1941/01/09, 75 y.o.   MRN: [id number]  HPI He is here for an acute visit.   Cold symptoms:  Symptoms started yesterday yesterday.  He is sneezing, has tooth pain and is concerned he may have a tooth infection, his eyes are watering, sore throat at times, fever intermittently, nasal congestion, pnd, sinus pain, cough, sob, wheeze,muslce aches, headaches, dizziness and lightheadedness.  He does not believe how bad he feels and is concerned the infection will go into his chest.  He has had a tooth infection in the past and has had almost all of his teeth removed - he only has 5 teeth left.  He does not follow with a dentist regularly.   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  He is taking his inhalers, but does not feel they are helping much.     Medications and allergies reviewed with patient and updated if appropriate.  Patient Active Problem List   Diagnosis Date Noted  . AAA (abdominal aortic aneurysm) without rupture ([place]) 03/15/2015  . Centrilobular emphysema ([place]) 12/12/2014  . Bronchiectasis without acute exacerbation ([place]) 09/11/2014  . Bulging lumbar disc 10/03/2013  . Open-angle glaucoma 05/05/2013  . 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 Multiple pulmonary nodules 02/24/2013  . Abnormal CT scan, kidney 02/24/2013  . Basal cell carcinoma of ala nasi 02/24/2013  . At risk for obstructive sleep apnea 02/09/2013  . 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 COLONIC POLYPS, HX OF 12/13/2007  . Hyperlipidemia 12/03/2006  . Essential hypertension 12/03/2006  . Coronary atherosclerosis 12/03/2006  . 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 Nevus, non-neoplastic 09/24/2006  . RHINITIS, ALLERGIC NOS 09/24/2006  . Exercise-induced bronchospasm 09/24/2006    Current Outpatient Prescriptions on File Prior to Visit  Medication Sig Dispense Refill  . albuterol (PROVENTIL HFA;VENTOLIN HFA) 108 (90 BASE) MCG/ACT inhaler Inhale 2  puffs into the lungs every 6 (six) hours as needed for wheezing or shortness of breath. 1 Inhaler 2  . aspirin EC 81 MG tablet Take 81 mg by mouth daily. PER DR TILLEY DO NOT STOP FOR PROCEDURE    . Brinzolamide-Brimonidine (SIMBRINZA) 1-0.2 % SUSP Place 1 drop into the right eye 2 (two) times daily.    . Coenzyme Q10 (COQ10) 100 MG CAPS Take 1 capsule by mouth daily.    . dorzolamide-timolol (COSOPT) 22.3-6.8 MG/ML ophthalmic solution Place 1 drop into the right eye 2 (two) times daily.    . fenofibrate micronized (LOFIBRA) 200 MG capsule TAKE ONE CAPSULE BY MOUTH ONCE DAILY WITH BREAKFAST 30 capsule 0  . Flaxseed, Linseed, 1300 MG CAPS Take by mouth 2 (two) times daily with a meal.    . Fluticasone-Salmeterol (ADVAIR DISKUS) 250-50 MCG/DOSE AEPB Inhale 1 puff into the lungs 2 (two) times daily. (Patient taking differently: Inhale 1 puff into the lungs 2 (two) times daily as needed. ) 14 each 0  . GARLIC PO Take 1 capsule by mouth daily.    Marland Kitchen ibuprofen (ADVIL,MOTRIN) 200 MG tablet Take 800 mg by mouth every 6 (six) hours as needed.    . metoprolol tartrate (LOPRESSOR) 25 MG tablet Take 1 tablet (25 mg total) by mouth 2 (two) times daily. 180 tablet 3  . Multiple Vitamins-Minerals (CENTRUM SILVER PO) Take 1 capsule by mouth daily.    Marland Kitchen NITROSTAT 0.4 MG SL tablet Place 0.4 mg under the tongue every 5 (five) minutes as needed.     Marland Kitchen  Omega-3 Fatty Acids (FISH OIL) 1200 MG CAPS Take 1 capsule by mouth 2 (two) times daily. 1 by mouth daily    . Respiratory Therapy Supplies (FLUTTER) DEVI Use as directed 1 each 0  . rosuvastatin (CRESTOR) 10 MG tablet Take 10 mg by mouth 2 (two) times a week. Monday and thursday    . senna-docusate (SENOKOT S) 8.6-50 MG per tablet Take 1 tablet by mouth 2 (two) times daily. (Patient taking differently: Take 1 tablet by mouth 2 (two) times daily as needed. ) 60 tablet 0   Current Facility-Administered Medications on File Prior to Visit  Medication Dose Route Frequency  Provider Last Rate Last Dose  . pneumococcal 13-valent conjugate vaccine (PREVNAR 13) injection 0.5 mL  0.5 mL Intramuscular Tomorrow-1000 Hendricks Limes, MD        Past Medical History  Diagnosis Date  . Hypertension   . Hyperlipidemia   . CAD (coronary artery disease)     CARDIOLOGIST--  DR Wynonia Lawman  . Seasonal allergies   . Right nephrolithiasis   . Right ureteral stone   . COPD (chronic obstructive pulmonary disease) ([place])   . First degree heart block   . History of adenomatous polyp of colon     HIGH GRADE SIGMOID DYSPLASIA  S/P POLYPECTOMY  . Open-angle glaucoma     RIGHT EYE ONLY  . Exercise-induced asthma   . At risk for sleep apnea     STOP-BANG = 4  SENT TO PCP 02-09-2013    Past Surgical History  Procedure Laterality Date  . Esi      ; for cervical radiculopathy  . Cystoscopy with retrograde pyelogram, ureteroscopy and stent placement Right 02/02/2013    Procedure: CYSTOSCOPY WITH RETROGRADE PYELOGRAM, AND Right STENT PLACEMENT;  Surgeon: Molli Hazard, MD;  Location: [facility];  Service: Urology;  Laterality: Right;  . Colonoscopy w/ polypectomy  x5  . Hemorrhoid surgery  07-03-1999  . Cardiac catheterization  09-04-2002  DR TILLEY    NORMAL LVF/  MILD TO MODERATE CAD INVOLVING PROXIMAL 40%  AND MID 40%  LAD/  POSTERIOR DESCENDING CX 70%  . Cardiac catheterization  06-05-2010  DR TILEY    NORMAL LM/  40% PROXIMAL MID LAD/ 70% DISTAL CFX/  80% PROXIMAL PDA/  SMALL & NONDOMINANT RCA  . Shoulder open rotator cuff repair Bilateral RIGHT X2  LAST ONE 1997/  LEFT 2005  . Cystoscopy with retrograde pyelogram, ureteroscopy and stent placement Right 02/15/2013    Procedure: CYSTOSCOPY WITH RETROGRADE PYELOGRAM, URETEROSCOPY AND STENT EXCHANGE;  Surgeon: Molli Hazard, MD;  Location: [facility];  Service: Urology;  Laterality: Right;  . Holmium laser application Right [id number]    Procedure: HOLMIUM LASER APPLICATION;  Surgeon: Molli Hazard,  MD;  Location: [facility];  Service: Urology;  Laterality: Right;  . Video bronchoscopy Bilateral 09/24/2014    Procedure: VIDEO BRONCHOSCOPY WITH FLUORO;  Surgeon: Juanito Doom, MD;  Location: [facility];  Service: Cardiopulmonary;  Laterality: Bilateral;    Social History   Social History  . Marital Status: Married    Spouse Name: N/A  . Number of Children: N/A  . Years of Education: N/A   Social History Main Topics  . Smoking status: Former Smoker -- 2.00 packs/day for 3 years    Types: Cigarettes    Quit date: 01/12/1970  . Smokeless tobacco: Never Used     Comment: smoked [place], up to 2 ppd  . Alcohol Use:  No  . Drug Use: No  . Sexual Activity: Not on file   Other Topics Concern  . Not on file   Social History Narrative    Family History  Problem Relation Age of Onset  . Stroke Father     in 39s  . Colon cancer Father     upper 81's  . Heart attack Brother 39    stent  . Cancer Brother     pre cancerous bladder lesion  . Diabetes Paternal Uncle   . Arthritis Neg Hx   . Asthma Neg Hx   . COPD Neg Hx   . Rectal cancer Neg Hx   . Stomach cancer Neg Hx     Review of Systems  Constitutional: Positive for fever.  HENT: Positive for congestion, postnasal drip, sinus pressure, sneezing and sore throat. Negative for ear pain (ear pressure).   Eyes:       Eyes watery  Respiratory: Positive for cough (occasional cough), shortness of breath and wheezing. Negative for chest tightness.   Cardiovascular: Negative for chest pain.  Gastrointestinal: Negative for nausea, abdominal pain and diarrhea.  Musculoskeletal: Positive for myalgias.  Neurological: Positive for dizziness, light-headedness and headaches.       Objective:   Filed Vitals:   06/19/15 1335  BP: 124/74  Pulse: 58  Temp: 98.2 F (36.8 C)  Resp: 18   Filed Weights   06/19/15 1335  Weight: 235 lb (106.595 kg)   Body mass index is 31.01 kg/(m^2).   Physical  Exam GENERAL APPEARANCE: Appears stated age, well appearing, NAD EYES: conjunctiva clear, no icterus HEENT: bilateral tympanic membranes and ear canals normal, oropharynx with no erythema, no pus from tooth, sinus pressure, no thyromegaly, trachea midline, no cervical or supraclavicular lymphadenopathy LUNGS: Clear to auscultation without wheeze or crackles, unlabored breathing, good air entry bilaterally HEART: Normal S1,S2 without murmurs EXTREMITIES: Without clubbing, cyanosis, or edema      Assessment & Plan:   See Problem List for Assessment and Plan of chronic medical problems.

## 2015-06-19 NOTE — Patient Instructions (Signed)
You probably have a sinus infection and possible tooth infection.   Your prescription(s) have been submitted to your pharmacy or been printed and provided for you. Please take as directed and contact our office if you believe you are having problem(s) with the medication(s) or have any questions.  If your symptoms worsen or fail to improve, please contact our office for further instruction, or in case of emergency go directly to the emergency room at the closest medical facility.   General Recommendations:    Please drink plenty of fluids.  Get plenty of rest   Sleep in humidified air  Use saline nasal sprays  Netti pot  OTC Medications:  Decongestants - helps relieve congestion   Flonase (generic fluticasone) or Nasacort (generic triamcinolone) - please make sure to use the "cross-over" technique at a 45 degree angle towards the opposite eye as opposed to straight up the nasal passageway.   Sudafed (generic pseudoephedrine - Note this is the one that is available behind the pharmacy counter); Products with phenylephrine (-PE) may also be used but is often not as effective as pseudoephedrine.   If you have HIGH BLOOD PRESSURE - Coricidin HBP; AVOID any product that is -D as this contains pseudoephedrine which may increase your blood pressure.  Afrin (oxymetazoline) every 6-8 hours for up to 3 days.  Allergies - helps relieve runny nose, itchy eyes and sneezing   Claritin (generic loratidine), Allegra (fexofenidine), or Zyrtec (generic cyrterizine) for runny nose. These medications should not cause drowsiness.  Note - Benadryl (generic diphenhydramine) may be used however may cause drowsiness  Cough -   Delsym or Robitussin (generic dextromethorphan)  Expectorants - helps loosen mucus to ease removal   Mucinex (generic guaifenesin) as directed on the package.  Headaches / General Aches   Tylenol (generic acetaminophen) - DO NOT EXCEED 3 grams (3,000  mg) in a 24 hour time period  Advil/Motrin (generic ibuprofen)  Sore Throat -   Salt water gargle   Chloraseptic (generic benzocaine) spray or lozenges / Sucrets (generic dyclonine)

## 2015-07-02 DIAGNOSIS — H401131 Primary open-angle glaucoma, bilateral, mild stage: Secondary | ICD-10-CM | POA: Diagnosis not present

## 2015-07-02 DIAGNOSIS — H2513 Age-related nuclear cataract, bilateral: Secondary | ICD-10-CM | POA: Diagnosis not present

## 2015-07-08 ENCOUNTER — Other Ambulatory Visit: Payer: Self-pay

## 2015-07-08 NOTE — Telephone Encounter (Signed)
Okay to send doxycycline to pharmacy if he agrees. Medication pending

## 2015-07-08 NOTE — Telephone Encounter (Signed)
Patient called and said he finished his abx for his sinus infection. But he is not feeling any better. He wanted to know if you could call something else in for him. Please advise or follow up.

## 2015-07-09 MED ORDER — DOXYCYCLINE HYCLATE 100 MG PO TABS: 100.0000 mg | ORAL_TABLET | Freq: Two times a day (BID) | ORAL | Status: DC

## 2015-07-09 NOTE — Telephone Encounter (Signed)
Spoke with pt to inform.  

## 2015-07-23 DIAGNOSIS — R0609 Other forms of dyspnea: Secondary | ICD-10-CM | POA: Diagnosis not present

## 2015-07-23 DIAGNOSIS — I25119 Atherosclerotic heart disease of native coronary artery with unspecified angina pectoris: Secondary | ICD-10-CM | POA: Diagnosis not present

## 2015-07-23 DIAGNOSIS — R0602 Shortness of breath: Secondary | ICD-10-CM | POA: Diagnosis not present

## 2015-07-23 DIAGNOSIS — E785 Hyperlipidemia, unspecified: Secondary | ICD-10-CM | POA: Diagnosis not present

## 2015-08-01 DIAGNOSIS — R972 Elevated prostate specific antigen [PSA]: Secondary | ICD-10-CM | POA: Diagnosis not present

## 2015-08-07 DIAGNOSIS — N402 Nodular prostate without lower urinary tract symptoms: Secondary | ICD-10-CM | POA: Diagnosis not present

## 2015-08-07 DIAGNOSIS — R972 Elevated prostate specific antigen [PSA]: Secondary | ICD-10-CM | POA: Diagnosis not present

## 2015-08-12 DIAGNOSIS — M5136 Other intervertebral disc degeneration, lumbar region: Secondary | ICD-10-CM | POA: Diagnosis not present

## 2015-08-13 DIAGNOSIS — M5136 Other intervertebral disc degeneration, lumbar region: Secondary | ICD-10-CM | POA: Diagnosis not present

## 2015-08-21 DIAGNOSIS — M5136 Other intervertebral disc degeneration, lumbar region: Secondary | ICD-10-CM | POA: Diagnosis not present

## 2015-08-26 DIAGNOSIS — M47816 Spondylosis without myelopathy or radiculopathy, lumbar region: Secondary | ICD-10-CM | POA: Diagnosis not present

## 2015-08-26 DIAGNOSIS — M5136 Other intervertebral disc degeneration, lumbar region: Secondary | ICD-10-CM | POA: Diagnosis not present

## 2015-09-05 ENCOUNTER — Other Ambulatory Visit: Payer: Self-pay | Admitting: *Deleted

## 2015-09-05 DIAGNOSIS — I712 Thoracic aortic aneurysm, without rupture: Secondary | ICD-10-CM

## 2015-09-05 DIAGNOSIS — I7121 Aneurysm of the ascending aorta, without rupture: Secondary | ICD-10-CM

## 2015-09-23 DIAGNOSIS — M5136 Other intervertebral disc degeneration, lumbar region: Secondary | ICD-10-CM | POA: Diagnosis not present

## 2015-09-26 DIAGNOSIS — R972 Elevated prostate specific antigen [PSA]: Secondary | ICD-10-CM | POA: Diagnosis not present

## 2015-09-26 DIAGNOSIS — D075 Carcinoma in situ of prostate: Secondary | ICD-10-CM | POA: Diagnosis not present

## 2015-10-02 DIAGNOSIS — H401131 Primary open-angle glaucoma, bilateral, mild stage: Secondary | ICD-10-CM | POA: Diagnosis not present

## 2015-10-04 ENCOUNTER — Ambulatory Visit (INDEPENDENT_AMBULATORY_CARE_PROVIDER_SITE_OTHER): Payer: PPO

## 2015-10-04 DIAGNOSIS — Z23 Encounter for immunization: Secondary | ICD-10-CM

## 2015-10-07 DIAGNOSIS — M5136 Other intervertebral disc degeneration, lumbar region: Secondary | ICD-10-CM | POA: Diagnosis not present

## 2015-10-09 ENCOUNTER — Ambulatory Visit (INDEPENDENT_AMBULATORY_CARE_PROVIDER_SITE_OTHER): Payer: PPO | Admitting: Surgery

## 2015-10-09 ENCOUNTER — Encounter: Payer: Self-pay | Admitting: Surgery

## 2015-10-09 ENCOUNTER — Ambulatory Visit
Admission: RE | Admit: 2015-10-09 | Discharge: 2015-10-09 | Disposition: A | Discharge status: Home / Self Care | Payer: PPO | Source: Ambulatory Visit | Attending: Surgery | Admitting: Surgery

## 2015-10-09 VITALS — BP 128/74 | HR 58 | Resp 16 | Ht 73.0 in | Wt 234.0 lb

## 2015-10-09 DIAGNOSIS — I712 Thoracic aortic aneurysm, without rupture, unspecified: Secondary | ICD-10-CM

## 2015-10-09 DIAGNOSIS — I7121 Aneurysm of the ascending aorta, without rupture: Secondary | ICD-10-CM

## 2015-10-09 MED ORDER — IOPAMIDOL (ISOVUE-370) INJECTION 76%
60.0000 mL | Freq: Once | INTRAVENOUS | Status: AC | PRN
  Administered 2015-10-09: 60 mL via INTRAVENOUS

## 2015-10-10 DIAGNOSIS — M5136 Other intervertebral disc degeneration, lumbar region: Secondary | ICD-10-CM | POA: Diagnosis not present

## 2015-10-13 ENCOUNTER — Encounter: Payer: Self-pay | Admitting: Surgery

## 2015-10-13 NOTE — Progress Notes (Signed)
HPI:  The patient is a 75 year old gentleman with a history of hypertension, hyperlipidemia, and coronary disease who was evaluated by Dr. Lake Bells for a few month history of dyspnea on exertion and wheezing and a CT scan of the chest showing multiple pulmonary nodules, one of which was 1.1 cm which was felt to possibly be due to MAI.  His CT scan also showed fusiform enlargement of the ascending aorta to 4.4 cm with the sinuses measuring 4.3 cm and the descending aorta 3.0 cm. This was unchanged from his prior CT of the chest done on 07/03/2013. He had a CT of the chest in 2003 and 2004 for chest pain that ruled out PE. There was no mention of the aorta and I could not access the films from that long ago. He denies any chest or back pain. He has been diagnosed with prostate cancer recently and is seeing Dr. Junious Silk for that.   Current Outpatient Prescriptions  Medication Sig Dispense Refill  . albuterol (PROVENTIL HFA;VENTOLIN HFA) 108 (90 BASE) MCG/ACT inhaler Inhale 2 puffs into the lungs every 6 (six) hours as needed for wheezing or shortness of breath. 1 Inhaler 2  . aspirin EC 81 MG tablet Take 81 mg by mouth daily. PER DR TILLEY DO NOT STOP FOR PROCEDURE    . Brinzolamide-Brimonidine (SIMBRINZA) 1-0.2 % SUSP Place 1 drop into the right eye 2 (two) times daily.    . Coenzyme Q10 (COQ10) 100 MG CAPS Take 1 capsule by mouth daily.    . dorzolamide-timolol (COSOPT) 22.3-6.8 MG/ML ophthalmic solution Place 1 drop into the right eye 2 (two) times daily.    Marland Kitchen doxycycline (VIBRA-TABS) 100 MG tablet Take 1 tablet (100 mg total) by mouth 2 (two) times daily. 20 tablet 0  . fenofibrate micronized (LOFIBRA) 200 MG capsule TAKE ONE CAPSULE BY MOUTH ONCE DAILY WITH BREAKFAST 30 capsule 0  . Flaxseed, Linseed, 1300 MG CAPS Take by mouth 2 (two) times daily with a meal.    . Fluticasone-Salmeterol (ADVAIR DISKUS) 250-50 MCG/DOSE AEPB Inhale 1 puff into the lungs 2 (two) times daily. (Patient taking  differently: Inhale 1 puff into the lungs 2 (two) times daily as needed. ) 14 each 0  . GARLIC PO Take 1 capsule by mouth daily.    Marland Kitchen ibuprofen (ADVIL,MOTRIN) 200 MG tablet Take 800 mg by mouth every 6 (six) hours as needed.    . metoprolol tartrate (LOPRESSOR) 25 MG tablet Take 1 tablet (25 mg total) by mouth 2 (two) times daily. 180 tablet 3  . Multiple Vitamins-Minerals (CENTRUM SILVER PO) Take 1 capsule by mouth daily.    Marland Kitchen NITROSTAT 0.4 MG SL tablet Place 0.4 mg under the tongue every 5 (five) minutes as needed.     . Omega-3 Fatty Acids (FISH OIL) 1200 MG CAPS Take 1 capsule by mouth 2 (two) times daily. 1 by mouth daily    . Respiratory Therapy Supplies (FLUTTER) DEVI Use as directed 1 each 0  . rosuvastatin (CRESTOR) 10 MG tablet Take 10 mg by mouth 2 (two) times a week. Monday and thursday    . senna-docusate (SENOKOT S) 8.6-50 MG per tablet Take 1 tablet by mouth 2 (two) times daily. (Patient taking differently: Take 1 tablet by mouth 2 (two) times daily as needed. ) 60 tablet 0   Current Facility-Administered Medications  Medication Dose Route Frequency Provider Last Rate Last Dose  . pneumococcal 13-valent conjugate vaccine (PREVNAR 13) injection 0.5 mL  0.5  mL Intramuscular Tomorrow-1000 Hendricks Limes, MD         Physical Exam: BP 128/74   Pulse (!) 58   Resp 16   Ht 6\' 1"  (1.854 m)   Wt 234 lb (106.1 kg)   SpO2 97% Comment: RA  BMI 30.87 kg/m  He looks well Cardiac exam shows a regular rate and rhythm with normal heart sounds Lung exam is clear   Diagnostic Tests:  Study Result   CLINICAL DATA:  Ascending thoracic aortic aneurysm. Routine revaluation.  EXAM: CT ANGIOGRAPHY CHEST WITH CONTRAST  TECHNIQUE: Multidetector CT imaging of the chest was performed using the standard protocol during bolus administration of intravenous contrast. Multiplanar CT image reconstructions and MIPs were obtained to evaluate the vascular anatomy.  CONTRAST:  60 mL  Isovue 370  COMPARISON:  12/05/2014, 09/05/2014, 07/03/2013  FINDINGS: Cardiovascular: Preferential opacification of the thoracic aorta. No evidence of thoracic aortic dissection. Ascending thoracic aortic aneurysm measuring 4.2 cm in diameter. Normal caliber aortic arch. Normal caliber descending thoracic aorta. Mild thoracic aortic atherosclerosis. Coronary artery atherosclerosis in the LAD. Normal heart size. No pericardial effusion.  Mediastinum/Nodes: No enlarged mediastinal, hilar, or axillary lymph nodes. Thyroid gland, trachea, and esophagus demonstrate no significant findings.  Lungs/Pleura: Bilateral moderate centrilobular emphysema. No pleural effusion or pneumothorax.  Right lower lobe pulmonary nodule (image 85/ series 5) mildly enlarged compared with 12/05/2014.  Slightly more caudally subpleural 8 mm right lower lobe pulmonary nodule enlarged compared with the prior exam.  Image 82/series 5 demonstrate small cluster of pulmonary nodules in the posterior right lower lobe the largest measuring 4 mm which are more conspicuous compared with the prior exam.  5 mm right lower lobe pulmonary nodule (image 75/series 5). 4 mm pulmonary nodule along the major fissure (image 50/series 5) unchanged in the prior exam.  Other small scattered sub 4 mm pulmonary nodules noted bilaterally unchanged from the prior exam.  Upper Abdomen: No acute abnormality. Partially visualized are multiple hypodense, fluid attenuating right renal mass is most consistent with cysts with the largest in the right upper pole measuring 5.5 cm.  Musculoskeletal: No acute osseous abnormality. Prior right anterior glenoid repair with the fixation screw retracted from the glenoid.  Review of the MIP images confirms the above findings.  IMPRESSION: 1. Stable ascending thoracic aortic aneurysm measuring 4.2 cm in diameter at the level of the right main pulmonary artery.  Recommend annual imaging followup by CTA or MRA. This recommendation follows 2010 ACCF/AHA/AATS/ACR/ASA/SCA/SCAI/SIR/STS/SVM Guidelines for the Diagnosis and Management of Patients with Thoracic Aortic Disease. Circulation. 2010; 121: [id number] 2. Multiple bilateral pulmonary nodules with the largest of the right lung base measuring 8 mm. Given the presence of centrilobular emphysema, follow-up CT chest is recommended in 6 months. 3. Prior right glenoid repair with the fixation screw retracted from the glenoid. 4. Aortic Atherosclerosis (ICD10-170.0) 5.  Emphysema. [id number])   Electronically Signed   By: Kathreen Devoid   On: 10/09/2015 10:30      Impression:  His ascending aortic aneurysm is 4.2 cm and stable in size. His BP is under good control and he is on a beta blocker. This will not require treatment unless it progressively enlarges to 5.5 cm, which is unlikely in his lifetime. I reveiwed the CT scan with the patient and his wife and answered their questions. The current recommendation is to follow this yearly.  Plan:  He will return to see me in one year with a CTA of the chest.  Gaye Pollack, MD [facility] [phone]

## 2015-10-18 DIAGNOSIS — C61 Malignant neoplasm of prostate: Secondary | ICD-10-CM | POA: Diagnosis not present

## 2015-10-23 DIAGNOSIS — M5136 Other intervertebral disc degeneration, lumbar region: Secondary | ICD-10-CM | POA: Diagnosis not present

## 2015-10-24 ENCOUNTER — Ambulatory Visit
Admission: RE | Admit: 2015-10-24 | Discharge: 2015-10-24 | Disposition: A | Discharge status: Home / Self Care | Payer: PPO | Source: Ambulatory Visit | Attending: Radiation Oncology | Admitting: Radiation Oncology

## 2015-10-24 ENCOUNTER — Encounter: Payer: Self-pay | Admitting: Radiation Oncology

## 2015-10-24 VITALS — BP 127/87 | HR 67 | Resp 16 | Ht 73.0 in | Wt 228.6 lb

## 2015-10-24 DIAGNOSIS — Z888 Allergy status to other drugs, medicaments and biological substances status: Secondary | ICD-10-CM | POA: Diagnosis not present

## 2015-10-24 DIAGNOSIS — Z87442 Personal history of urinary calculi: Secondary | ICD-10-CM | POA: Diagnosis not present

## 2015-10-24 DIAGNOSIS — Z8249 Family history of ischemic heart disease and other diseases of the circulatory system: Secondary | ICD-10-CM | POA: Diagnosis not present

## 2015-10-24 DIAGNOSIS — Z7951 Long term (current) use of inhaled steroids: Secondary | ICD-10-CM | POA: Diagnosis not present

## 2015-10-24 DIAGNOSIS — C61 Malignant neoplasm of prostate: Secondary | ICD-10-CM

## 2015-10-24 DIAGNOSIS — J449 Chronic obstructive pulmonary disease, unspecified: Secondary | ICD-10-CM | POA: Diagnosis not present

## 2015-10-24 DIAGNOSIS — H4010X0 Unspecified open-angle glaucoma, stage unspecified: Secondary | ICD-10-CM | POA: Insufficient documentation

## 2015-10-24 DIAGNOSIS — I2510 Atherosclerotic heart disease of native coronary artery without angina pectoris: Secondary | ICD-10-CM | POA: Insufficient documentation

## 2015-10-24 DIAGNOSIS — Z885 Allergy status to narcotic agent status: Secondary | ICD-10-CM | POA: Insufficient documentation

## 2015-10-24 DIAGNOSIS — Z7982 Long term (current) use of aspirin: Secondary | ICD-10-CM | POA: Diagnosis not present

## 2015-10-24 DIAGNOSIS — Z882 Allergy status to sulfonamides status: Secondary | ICD-10-CM | POA: Diagnosis not present

## 2015-10-24 DIAGNOSIS — Z87891 Personal history of nicotine dependence: Secondary | ICD-10-CM | POA: Diagnosis not present

## 2015-10-24 DIAGNOSIS — I10 Essential (primary) hypertension: Secondary | ICD-10-CM | POA: Insufficient documentation

## 2015-10-24 DIAGNOSIS — Z79899 Other long term (current) drug therapy: Secondary | ICD-10-CM | POA: Diagnosis not present

## 2015-10-24 DIAGNOSIS — Z800 Family history of malignant neoplasm of digestive organs: Secondary | ICD-10-CM | POA: Diagnosis not present

## 2015-10-24 DIAGNOSIS — E785 Hyperlipidemia, unspecified: Secondary | ICD-10-CM | POA: Diagnosis not present

## 2015-10-24 DIAGNOSIS — Z823 Family history of stroke: Secondary | ICD-10-CM | POA: Insufficient documentation

## 2015-10-24 HISTORY — DX: Malignant neoplasm of prostate: C61

## 2015-10-24 NOTE — Progress Notes (Signed)
See progress note under physician encounter. 

## 2015-10-24 NOTE — Progress Notes (Signed)
GU Location of Tumor / Histology: prostatic adenocarcinoma  If Prostate Cancer, Gleason Score is (3 + 4) and PSA is (4.7)  David Cantrell presented to Dr. Junious Silk on 08/07/15 to discuss management of prostate nodule discovered on 04/04/15.  Biopsies of prostate (if applicable) revealed:    Past/Anticipated interventions by urology, if any: biopsy, referral to radiation oncology to discuss brachytherapy  Past/Anticipated interventions by medical oncology, if any: no  Weight changes, if any: intentional weight loss from 240 lb to 228 lb  Bowel/Bladder complaints, if any:  IPSS 11 with incomplete emptying, urgency, weak stream, straining and nocturia x 1. Denies any bowel complaints. Reports persistent perineal numbness and penis pain. Denies dysuria, hematuria, incontinence or leakage.  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 no  Pacemaker/ICD? no  Possible current pregnancy? no  Is the patient on methotrexate? no  Current Complaints / other details:  75 year old male. Married. Retired. Prostate volume: 23.72 gram. Brother and cousin both with prostate ca.

## 2015-10-25 DIAGNOSIS — M5136 Other intervertebral disc degeneration, lumbar region: Secondary | ICD-10-CM | POA: Diagnosis not present

## 2015-10-29 ENCOUNTER — Telehealth: Payer: Self-pay | Admitting: *Deleted

## 2015-10-29 DIAGNOSIS — M5136 Other intervertebral disc degeneration, lumbar region: Secondary | ICD-10-CM | POA: Diagnosis not present

## 2015-10-29 NOTE — Telephone Encounter (Signed)
CALLED PATIENT TO UPDATE ABOUT GOLD SEED PLACEMENT, INFORMED HIM THAT I AM CONTINUALLY LEAVING VM, I HAVE NO APPT. AT THIS TIME, I WILL CONTINUALLY UPDATE

## 2015-11-01 DIAGNOSIS — M5136 Other intervertebral disc degeneration, lumbar region: Secondary | ICD-10-CM | POA: Diagnosis not present

## 2015-11-04 ENCOUNTER — Encounter: Payer: Self-pay | Admitting: Medical Oncology

## 2015-11-04 NOTE — Progress Notes (Unsigned)
I introduced myself to the Wards and my role as a navigator. He is here today to discuss radiation treatments with Dr. Tammi Klippel. He voiced interest in brachytherapy with radiation.  I will continue to follow him and asked he call with questions or concerns.

## 2015-11-08 ENCOUNTER — Telehealth: Payer: Self-pay | Admitting: *Deleted

## 2015-11-08 NOTE — Telephone Encounter (Signed)
CALLED PATIENT TO INFORM OF GOLD SEED PLACEMENT ON 12-04-15 - ARRIVAL TIME - 2:45 PM @ DR. Lyndal Rainbow OFFICE AND SIM ON 12-12-15 @ 9 AM @ DR. MANNNG'S OFFICE, SPOKE WITH PATIENT AND HE IS AWARE OF THESE APPTS.

## 2015-11-14 ENCOUNTER — Ambulatory Visit: Payer: PPO | Admitting: Radiation Oncology

## 2015-12-04 DIAGNOSIS — C61 Malignant neoplasm of prostate: Secondary | ICD-10-CM | POA: Diagnosis not present

## 2015-12-04 NOTE — Addendum Note (Signed)
Encounter addended by: Hayden Pedro, PA-C on: 12/04/2015  7:36 AM<BR>    Actions taken: Sign clinical note, LOS modified, Follow-up modified

## 2015-12-04 NOTE — Progress Notes (Signed)
Addendum 12/04/15   Radiation Oncology         [phone] ________________________________  Initial Outpatient Consultation  Name: David Cantrell MRN: [id number]  Date: 10/24/2015  DOB: [birth year]  [id number] [name], MD  Festus Aloe, MD   REFERRING PHYSICIAN: Festus Aloe, MD  DIAGNOSIS: The encounter diagnosis was Malignant neoplasm of prostate [facility]).    ICD-9-CM ICD-10-CM   1. Malignant neoplasm of prostate ([place]) 185 C61     HISTORY OF PRESENT ILLNESS: David Cantrell is a 75 y.o. male seen at the request of Dr. Junious Silk for a new diagnosis of prostate cancer. He was found to have a palpable prostate nodule on exam by PCP. He presented for PSA which was 4.61 in March 2017, 4.35 in May 2017, and 4.7 on 07/29/15. A subsequent biopsy was performed on 09/26/15 which revealed a 3+4 adenocarcinoma in 4/12 cores, and a 3+3 in 2/12 cores. He has been referred to discuss radiotherapy options for his cancer care.   PREVIOUS RADIATION THERAPY: No  PAST MEDICAL HISTORY:  Past Medical History:  Diagnosis Date  . At risk for sleep apnea    STOP-BANG = 4  SENT TO PCP 02-09-2013  . CAD (coronary artery disease)    CARDIOLOGIST--  DR Wynonia Lawman  . COPD (chronic obstructive pulmonary disease) ([place])   . Exercise-induced asthma   . First degree heart block   . History of adenomatous polyp of colon    HIGH GRADE SIGMOID DYSPLASIA  S/P POLYPECTOMY  . Hyperlipidemia   . Hypertension   . Open-angle glaucoma    RIGHT EYE ONLY/OPEN ANGLE  . Prostate cancer ([place])   . Right nephrolithiasis   . Right ureteral stone   . Seasonal allergies       PAST SURGICAL HISTORY: Past Surgical History:  Procedure Laterality Date  . CARDIAC CATHETERIZATION  09-04-2002  DR Wynonia Lawman   NORMAL LVF/  MILD TO MODERATE CAD INVOLVING PROXIMAL 40%  AND MID 40%  LAD/  POSTERIOR DESCENDING CX 70%  . CARDIAC CATHETERIZATION  06-05-2010  DR Fidela Juneau   NORMAL LM/  40% PROXIMAL MID LAD/ 70% DISTAL CFX/  80% PROXIMAL PDA/   SMALL & NONDOMINANT RCA  . COLONOSCOPY W/ POLYPECTOMY  x5  . CYSTOSCOPY WITH RETROGRADE PYELOGRAM, URETEROSCOPY AND STENT PLACEMENT Right 02/02/2013   Procedure: CYSTOSCOPY WITH RETROGRADE PYELOGRAM, AND Right STENT PLACEMENT;  Surgeon: Molli Hazard, MD;  Location: [facility];  Service: Urology;  Laterality: Right;  . CYSTOSCOPY WITH RETROGRADE PYELOGRAM, URETEROSCOPY AND STENT PLACEMENT Right 02/15/2013   Procedure: CYSTOSCOPY WITH RETROGRADE PYELOGRAM, URETEROSCOPY AND STENT EXCHANGE;  Surgeon: Molli Hazard, MD;  Location[facility];  Service: Urology;  Laterality: Right;  . ESI     ; for cervical radiculopathy  . HEMORRHOID SURGERY  07-03-1999  . HOLMIUM LASER APPLICATION Right [id number]   Procedure: HOLMIUM LASER APPLICATION;  Surgeon: Molli Hazard, MD;  Location: [facility];  Service: Urology;  Laterality: Right;  . PROSTATE BIOPSY    . SHOULDER OPEN ROTATOR CUFF REPAIR Bilateral RIGHT X2  LAST ONE 1997/  LEFT 2005  . VIDEO BRONCHOSCOPY Bilateral 09/24/2014   Procedure: VIDEO BRONCHOSCOPY WITH FLUORO;  Surgeon: Juanito Doom, MD;  Location: [facility];  Service: Cardiopulmonary;  Laterality: Bilateral;    FAMILY HISTORY:  Family History  Problem Relation Age of Onset  . Stroke Father     in 48s  . Colon cancer Father     upper 58's  .  Cancer Father     colon  . Heart attack Brother 44    stent  . Cancer Brother     pre cancerous bladder lesion  . Diabetes Paternal Uncle   . Cancer Cousin     prostate  . Cancer Other     bladder  . Arthritis Neg Hx   . Asthma Neg Hx   . COPD Neg Hx   . Rectal cancer Neg Hx   . Stomach cancer Neg Hx     SOCIAL HISTORY:  Social History   Social History  . Marital status: Married    Spouse name: N/A  . Number of children: N/A  . Years of education: N/A   Occupational History  . Not on file.   Social History Main Topics  . Smoking status: Former Smoker    Packs/day: 2.00      Years: 3.00    Types: Cigarettes    Quit date: 01/12/1970  . Smokeless tobacco: Never Used     Comment: smoked Altha, up to 2 ppd  . Alcohol use No  . Drug use: No  . Sexual activity: Not Currently   Other Topics Concern  . Not on file   Social History Narrative  . No narrative on file  The patient resides in [place].  ALLERGIES: Lipitor [atorvastatin]; Morphine and related; Niacin and related; Ramipril; Sulfa antibiotics; and Codeine  MEDICATIONS:  Current Outpatient Prescriptions  Medication Sig Dispense Refill  . acetaminophen (TYLENOL) 325 MG tablet Take 500 mg by mouth every 6 (six) hours as needed.    Marland Kitchen albuterol (PROVENTIL HFA;VENTOLIN HFA) 108 (90 BASE) MCG/ACT inhaler Inhale 2 puffs into the lungs every 6 (six) hours as needed for wheezing or shortness of breath. 1 Inhaler 2  . aspirin EC 81 MG tablet Take 81 mg by mouth daily. PER DR TILLEY DO NOT STOP FOR PROCEDURE    . Brinzolamide-Brimonidine (SIMBRINZA) 1-0.2 % SUSP Place 1 drop into the right eye 2 (two) times daily.    . Coenzyme Q10 (COQ10) 100 MG CAPS Take 1 capsule by mouth daily.    . dorzolamide-timolol (COSOPT) 22.3-6.8 MG/ML ophthalmic solution Place 1 drop into the right eye 2 (two) times daily.    . fenofibrate micronized (LOFIBRA) 200 MG capsule TAKE ONE CAPSULE BY MOUTH ONCE DAILY WITH BREAKFAST 30 capsule 0  . Flaxseed, Linseed, 1300 MG CAPS Take by mouth 2 (two) times daily with a meal.    . Fluticasone-Salmeterol (ADVAIR DISKUS) 250-50 MCG/DOSE AEPB Inhale 1 puff into the lungs 2 (two) times daily. (Patient taking differently: Inhale 1 puff into the lungs 2 (two) times daily as needed. ) 14 each 0  . GARLIC PO Take 1 capsule by mouth daily.    . metoprolol tartrate (LOPRESSOR) 25 MG tablet Take 1 tablet (25 mg total) by mouth 2 (two) times daily. 180 tablet 3  . Multiple Vitamins-Minerals (MULTIVITAMIN ADULTS PO) Take by mouth.    Marland Kitchen NITROSTAT 0.4 MG SL tablet Place 0.4 mg under the tongue every  5 (five) minutes as needed.     . Omega-3 Fatty Acids (FISH OIL) 1200 MG CAPS Take 1 capsule by mouth 2 (two) times daily. 1 by mouth daily    . rosuvastatin (CRESTOR) 10 MG tablet Take 10 mg by mouth 2 (two) times a week. Monday and thursday     Current Facility-Administered Medications  Medication Dose Route Frequency Provider Last Rate Last Dose  . pneumococcal 13-valent conjugate vaccine (PREVNAR 13) injection  0.5 mL  0.5 mL Intramuscular Tomorrow-1000 Hendricks Limes, MD        REVIEW OF SYSTEMS:  On review of systems, the patient reports that he is doing well overall. He denies any chest pain, shortness of breath, cough, fevers, chills, night sweats, unintended weight changes. He denies any bowel or bladder disturbances, and denies abdominal pain, nausea or vomiting. He denies any new musculoskeletal or joint aches or pains. A complete review of systems is obtained and is otherwise negative.    PHYSICAL EXAM:  Wt Readings from Last 3 Encounters:  10/24/15 228 lb 9.6 oz (103.7 kg)  10/09/15 234 lb (106.1 kg)  06/19/15 235 lb (106.6 kg)   Temp Readings from Last 3 Encounters:  06/19/15 98.2 F (36.8 C) (Oral)  05/29/15 98.2 F (36.8 C)  03/15/15 98 F (36.7 C) (Oral)   BP Readings from Last 3 Encounters:  10/24/15 127/87  10/09/15 128/74  06/19/15 124/74   Pulse Readings from Last 3 Encounters:  10/24/15 67  10/09/15 (!) 58  06/19/15 (!) 58    Pain Scale 0/10 In general this is a well appearing Caucasian male in no acute distress. He is alert and oriented x4 and appropriate throughout the examination. HEENT reveals that the patient is normocephalic, atraumatic. EOMs are intact. PERRLA. Skin is intact without any evidence of gross lesions. Cardiovascular exam reveals a regular rate and rhythm, no clicks rubs or murmurs are auscultated. Chest is clear to auscultation bilaterally. Lymphatic assessment is performed and does not reveal any adenopathy in the cervical,  supraclavicular, axillary, or inguinal chains. Abdomen has active bowel sounds in all quadrants and is intact. The abdomen is soft, non tender, non distended. Lower extremities are negative for pretibial pitting edema, deep calf tenderness, cyanosis or clubbing.   KPS = 100  100 - Normal; no complaints; no evidence of disease. 90   - Able to carry on normal activity; minor signs or symptoms of disease. 80   - Normal activity with effort; some signs or symptoms of disease. 49   - Cares for self; unable to carry on normal activity or to do active work. 60   - Requires occasional assistance, but is able to care for most of his personal needs. 50   - Requires considerable assistance and frequent medical care. 2   - Disabled; requires special care and assistance. 64   - Severely disabled; hospital admission is indicated although death not imminent. 10   - Very sick; hospital admission necessary; active supportive treatment necessary. 10   - Moribund; fatal processes progressing rapidly. 0     - Dead  Karnofsky DA, Abelmann [place], Craver LS and Burchenal [facility] [phone]) The use of the nitrogen mustards in the palliative treatment of carcinoma: with particular reference to bronchogenic carcinoma Cancer 1 634-56  LABORATORY DATA:  Lab Results  Component Value Date   WBC 8.1 03/19/2015   HGB 15.5 03/19/2015   HCT 45.0 03/19/2015   MCV 92.1 03/19/2015   PLT 286.0 03/19/2015   Lab Results  Component Value Date   NA 141 03/19/2015   K 4.0 03/19/2015   CL 107 03/19/2015   CO2 22 03/19/2015   Lab Results  Component Value Date   ALT 19 03/19/2015   AST 18 03/19/2015   ALKPHOS 52 03/19/2015   BILITOT 0.9 03/19/2015     RADIOGRAPHY: No results found.    IMPRESSION/PLAN: 1. 75 y.o. gentleman with T1c, adenocarcinoma of the prostate with a Gleason score  of 3+4 and a PSA of 4.7. Dr. Tammi Klippel reviews the findings and work up to date, he reviews the pathologic diagnosis and reviews that in  intermediate risk prostate cancer, recommendations would include treatment with either external raditoherapy versus radioactive seed implant. The patient is not felt to be a good surgical candidate, and in reviewing the options for radiotherapy, after consideration, due to the high volume of disease, he would be best suited for external radiotherapy. The patient is interested in moving forward with the planning of this. We will schedule him to meet back with Dr. Junious Silk for fiducial marker placement, and move forward with simulation and subsequent treatment. The risks, benefits, short, and long term effects were reviewed and we will move forward.  The above documentation reflects my direct findings during this shared patient visit. Please see the separate note by Dr. Tammi Klippel on this date for the remainder of the patient's plan of care.   Carola Rhine, PAC

## 2015-12-07 ENCOUNTER — Other Ambulatory Visit: Payer: Self-pay | Admitting: Radiation Oncology

## 2015-12-11 ENCOUNTER — Telehealth: Payer: Self-pay | Admitting: *Deleted

## 2015-12-11 NOTE — Telephone Encounter (Signed)
Called patient to inform that sim appt. Has been moved to 01-31-16 @ 10 am due to patient not having his Lupron, spoke with patient and he is aware of this appt.  change.

## 2015-12-12 ENCOUNTER — Ambulatory Visit: Payer: PPO | Admitting: Radiation Oncology

## 2015-12-19 DIAGNOSIS — R102 Pelvic and perineal pain: Secondary | ICD-10-CM | POA: Diagnosis not present

## 2015-12-19 DIAGNOSIS — C61 Malignant neoplasm of prostate: Secondary | ICD-10-CM | POA: Diagnosis not present

## 2016-01-21 ENCOUNTER — Encounter: Payer: Self-pay | Admitting: Internal Medicine

## 2016-01-22 NOTE — Telephone Encounter (Signed)
Can you write a letter - I recommended massage therapy for what he is getting it for. Thanks.

## 2016-01-27 ENCOUNTER — Encounter: Payer: Self-pay | Admitting: Emergency Medicine

## 2016-01-27 DIAGNOSIS — E668 Other obesity: Secondary | ICD-10-CM | POA: Diagnosis not present

## 2016-01-27 DIAGNOSIS — I25119 Atherosclerotic heart disease of native coronary artery with unspecified angina pectoris: Secondary | ICD-10-CM | POA: Diagnosis not present

## 2016-01-27 DIAGNOSIS — I10 Essential (primary) hypertension: Secondary | ICD-10-CM | POA: Diagnosis not present

## 2016-01-27 DIAGNOSIS — E785 Hyperlipidemia, unspecified: Secondary | ICD-10-CM | POA: Diagnosis not present

## 2016-01-31 ENCOUNTER — Ambulatory Visit
Admission: RE | Admit: 2016-01-31 | Discharge: 2016-01-31 | Disposition: A | Discharge status: Home / Self Care | Payer: PPO | Source: Ambulatory Visit | Attending: Radiation Oncology | Admitting: Radiation Oncology

## 2016-01-31 ENCOUNTER — Encounter: Payer: Self-pay | Admitting: Medical Oncology

## 2016-01-31 DIAGNOSIS — C61 Malignant neoplasm of prostate: Secondary | ICD-10-CM | POA: Diagnosis not present

## 2016-01-31 DIAGNOSIS — Z510 Encounter for antineoplastic radiation therapy: Secondary | ICD-10-CM | POA: Insufficient documentation

## 2016-01-31 NOTE — Progress Notes (Signed)
  Radiation Oncology         [phone] ________________________________  Name: Eliano Srader Terrien MRN: [id number]  Date: 01/31/2016  DOB: 08/26/1940  SIMULATION AND TREATMENT PLANNING NOTE    ICD-9-CM ICD-10-CM   1. Malignant neoplasm of prostate 185 C61     DIAGNOSIS:  76 y.o. gentleman with T1c, adenocarcinoma of the prostate with a Gleason score of 3+4 and a PSA of 4.7  NARRATIVE:  The patient was brought to the [facility].  Identity was confirmed.  All relevant records and images related to the planned course of therapy were reviewed.  The patient freely provided informed written consent to proceed with treatment after reviewing the details related to the planned course of therapy. The consent form was witnessed and verified by the simulation staff.  Then, the patient was set-up in a stable reproducible supine position for radiation therapy.  A vacuum lock pillow device was custom fabricated to position his legs in a reproducible immobilized position.  Then, I performed a urethrogram under sterile conditions to identify the prostatic apex.  CT images were obtained.  Surface markings were placed.  The CT images were loaded into the planning software.  Then the prostate target and avoidance structures including the rectum, bladder, bowel and hips were contoured.  Treatment planning then occurred.  The radiation prescription was entered and confirmed.  A total of 5 complex treatment devices were fabricated including one custom fit BodyFix pillow and 4 MLC's to shield bowel, bladder, rectum and hips. I have requested : 3D Simulation  I have requested a DVH of the following structures: rectum, bladder, prostate, left hip, and right hip.  PUBIC ARCH EVALUATION:  The patient also underwent evaluation for possible prostate seed implant. His 3-dimensional image study set was uploaded to the planning computer. There, on each axial slice, I contoured the prostate gland. Then, using  three-dimensional radiation planning tools I reconstructed the prostate in view of the structures from the transperineal needle pathway to assess for possible pubic arch interference. In doing so, I did not appreciate any pubic arch interference. Also, the patient's prostate volume was estimated based on the drawn structure. The volume was 26 cc.  Given the pubic arch appearance and prostate volume, patient remains a good candidate to proceed with prostate seed implant.     PLAN:  The patient will receive 45 Gy in 25 fractions followed by prostate seed implant  ________________________________  Sheral Apley. Tammi Klippel, M.D.

## 2016-02-05 DIAGNOSIS — Z510 Encounter for antineoplastic radiation therapy: Secondary | ICD-10-CM | POA: Diagnosis not present

## 2016-02-05 DIAGNOSIS — C61 Malignant neoplasm of prostate: Secondary | ICD-10-CM | POA: Diagnosis not present

## 2016-02-06 ENCOUNTER — Telehealth: Payer: Self-pay | Admitting: Medical Oncology

## 2016-02-06 NOTE — Progress Notes (Signed)
Gayle-wife called stating they have been doing some research on healthy diet for prostate cancer. She asked if I have any information on foods that he should eat/not eat.  I informed them that I have a handout and a cook book that I will provide for them 02/11/16 when he comes for his first radiation treatment. He voiced understanding.

## 2016-02-11 ENCOUNTER — Ambulatory Visit: Payer: PPO | Admitting: Radiation Oncology

## 2016-02-11 ENCOUNTER — Ambulatory Visit
Admission: RE | Admit: 2016-02-11 | Discharge: 2016-02-11 | Disposition: A | Discharge status: Home / Self Care | Payer: PPO | Source: Ambulatory Visit | Attending: Radiation Oncology | Admitting: Radiation Oncology

## 2016-02-11 ENCOUNTER — Encounter: Payer: Self-pay | Admitting: Medical Oncology

## 2016-02-11 DIAGNOSIS — C61 Malignant neoplasm of prostate: Secondary | ICD-10-CM | POA: Diagnosis not present

## 2016-02-11 DIAGNOSIS — Z510 Encounter for antineoplastic radiation therapy: Secondary | ICD-10-CM | POA: Diagnosis not present

## 2016-02-11 NOTE — Progress Notes (Signed)
nav 

## 2016-02-11 NOTE — Progress Notes (Signed)
Pt 's wife requested some information regarding diet. I gave her a handout regarding prostate cancer and diet and also a cookbook. We discussed diet and treatment and how to alter if patient develops side effects such as diarrhea. I will continue to follow her and asked them to call with any questions or concerns.

## 2016-02-12 ENCOUNTER — Ambulatory Visit: Payer: PPO

## 2016-02-12 ENCOUNTER — Ambulatory Visit
Admission: RE | Admit: 2016-02-12 | Discharge: 2016-02-12 | Disposition: A | Discharge status: Home / Self Care | Payer: PPO | Source: Ambulatory Visit | Attending: Radiation Oncology | Admitting: Radiation Oncology

## 2016-02-12 DIAGNOSIS — C61 Malignant neoplasm of prostate: Secondary | ICD-10-CM | POA: Diagnosis not present

## 2016-02-12 DIAGNOSIS — Z510 Encounter for antineoplastic radiation therapy: Secondary | ICD-10-CM | POA: Diagnosis not present

## 2016-02-13 ENCOUNTER — Ambulatory Visit
Admission: RE | Admit: 2016-02-13 | Discharge: 2016-02-13 | Disposition: A | Discharge status: Home / Self Care | Payer: PPO | Source: Ambulatory Visit | Attending: Radiation Oncology | Admitting: Radiation Oncology

## 2016-02-13 ENCOUNTER — Ambulatory Visit: Payer: PPO

## 2016-02-13 DIAGNOSIS — C61 Malignant neoplasm of prostate: Secondary | ICD-10-CM | POA: Diagnosis not present

## 2016-02-13 DIAGNOSIS — Z510 Encounter for antineoplastic radiation therapy: Secondary | ICD-10-CM | POA: Diagnosis not present

## 2016-02-14 ENCOUNTER — Ambulatory Visit
Admission: RE | Admit: 2016-02-14 | Discharge: 2016-02-14 | Disposition: A | Discharge status: Home / Self Care | Payer: PPO | Source: Ambulatory Visit | Attending: Radiation Oncology | Admitting: Radiation Oncology

## 2016-02-14 ENCOUNTER — Ambulatory Visit: Payer: PPO

## 2016-02-14 VITALS — BP 125/81 | HR 73 | Resp 18 | Wt 229.8 lb

## 2016-02-14 DIAGNOSIS — C61 Malignant neoplasm of prostate: Secondary | ICD-10-CM | POA: Diagnosis not present

## 2016-02-14 DIAGNOSIS — Z510 Encounter for antineoplastic radiation therapy: Secondary | ICD-10-CM | POA: Diagnosis not present

## 2016-02-14 NOTE — Progress Notes (Signed)
Weight and vitals stable. Reports rectal pain yesterday that radiated to his groin. Reports he received his initial ADT injection in December. Reports hot flashes and night sweat since injection. Reports nocturia x 1. Denies dysuria or hematuria. Denies urgency or frequency. Describes strong steady urine stream that can be intermittent toward the end of emptying. Denies difficulty emptying his bladdder. Denies incontinence or leakage. Reports loose frequent stools. Reports mild fatigue.   BP 125/81 (BP Location: Right Arm, Patient Position: Sitting, Cuff Size: Large)   Pulse 73   Resp 18   Wt 229 lb 12.8 oz (104.2 kg)   SpO2 97%   BMI 30.32 kg/m  Wt Readings from Last 3 Encounters:  02/14/16 229 lb 12.8 oz (104.2 kg)  10/24/15 228 lb 9.6 oz (103.7 kg)  10/09/15 234 lb (106.1 kg)

## 2016-02-14 NOTE — Progress Notes (Signed)
  Radiation Oncology         [phone]   Name: David Cantrell MRN: [id number]   Date: 02/14/2016  DOB: 05/25/1940     Weekly Radiation Therapy Management    ICD-9-CM ICD-10-CM   1. 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  Weight and vitals stable. Reports rectal pain yesterday that radiated to his groin. Reports he received his initial ADT injection in December. Reports hot flashes and night sweat since injection. Reports nocturia x 1. Denies dysuria or hematuria. Denies urgency or frequency. Describes strong steady urine stream that can be intermittent toward the end of emptying. Denies difficulty emptying his bladder. Denies incontinence or leakage. Reports loose frequent stools. Reports mild fatigue.     Set-up films were reviewed. The chart was checked.  Physical Findings  weight is 229 lb 12.8 oz (104.2 kg). His blood pressure is 125/81 and his pulse is 73. His respiration is 18 and oxygen saturation is 97%. . Weight essentially stable.  No significant changes. Alert and in no acute distress.   Impression The patient is tolerating radiation. We discussed a prescription for Flomax to alleviate urinary symptoms. The patient is not interested at this time.   Plan Continue treatment as planned.     Sheral Apley Tammi Klippel, M.D.  This document serves as a record of services personally performed by Tyler Pita, MD. It was created on his behalf by Arlyce Harman, a trained medical scribe. The creation of this record is based on the scribe's personal observations and the provider's statements to them. This document has been checked and approved by the attending provider.

## 2016-02-17 ENCOUNTER — Ambulatory Visit
Admission: RE | Admit: 2016-02-17 | Discharge: 2016-02-17 | Disposition: A | Discharge status: Home / Self Care | Payer: PPO | Source: Ambulatory Visit | Attending: Radiation Oncology | Admitting: Radiation Oncology

## 2016-02-17 ENCOUNTER — Ambulatory Visit: Payer: PPO

## 2016-02-17 DIAGNOSIS — Z510 Encounter for antineoplastic radiation therapy: Secondary | ICD-10-CM | POA: Diagnosis not present

## 2016-02-17 DIAGNOSIS — C61 Malignant neoplasm of prostate: Secondary | ICD-10-CM | POA: Diagnosis not present

## 2016-02-18 ENCOUNTER — Ambulatory Visit
Admission: RE | Admit: 2016-02-18 | Discharge: 2016-02-18 | Disposition: A | Discharge status: Home / Self Care | Payer: PPO | Source: Ambulatory Visit | Attending: Radiation Oncology | Admitting: Radiation Oncology

## 2016-02-18 ENCOUNTER — Ambulatory Visit: Payer: PPO

## 2016-02-18 DIAGNOSIS — Z510 Encounter for antineoplastic radiation therapy: Secondary | ICD-10-CM | POA: Diagnosis not present

## 2016-02-18 DIAGNOSIS — C61 Malignant neoplasm of prostate: Secondary | ICD-10-CM | POA: Diagnosis not present

## 2016-02-19 ENCOUNTER — Ambulatory Visit: Payer: PPO

## 2016-02-19 ENCOUNTER — Ambulatory Visit
Admission: RE | Admit: 2016-02-19 | Discharge: 2016-02-19 | Disposition: A | Discharge status: Home / Self Care | Payer: PPO | Source: Ambulatory Visit | Attending: Radiation Oncology | Admitting: Radiation Oncology

## 2016-02-19 DIAGNOSIS — Z510 Encounter for antineoplastic radiation therapy: Secondary | ICD-10-CM | POA: Diagnosis not present

## 2016-02-19 DIAGNOSIS — C61 Malignant neoplasm of prostate: Secondary | ICD-10-CM | POA: Diagnosis not present

## 2016-02-20 ENCOUNTER — Ambulatory Visit
Admission: RE | Admit: 2016-02-20 | Discharge: 2016-02-20 | Disposition: A | Discharge status: Home / Self Care | Payer: PPO | Source: Ambulatory Visit | Attending: Radiation Oncology | Admitting: Radiation Oncology

## 2016-02-20 ENCOUNTER — Ambulatory Visit: Payer: PPO

## 2016-02-20 ENCOUNTER — Encounter: Payer: Self-pay | Admitting: Medical Oncology

## 2016-02-20 VITALS — BP 123/91 | HR 71 | Resp 18 | Wt 229.6 lb

## 2016-02-20 DIAGNOSIS — Z510 Encounter for antineoplastic radiation therapy: Secondary | ICD-10-CM | POA: Diagnosis not present

## 2016-02-20 DIAGNOSIS — C61 Malignant neoplasm of prostate: Secondary | ICD-10-CM | POA: Diagnosis not present

## 2016-02-20 NOTE — Progress Notes (Signed)
Weight and vitals stable. Reports continued occasional mild brief rectal pain that radiates to his groin. Reports hot flashes and night sweat since ADT injection in December continue. Reports nocturia x1. Denies dysuria, hematuria, frequency or urgency. Reports his urine stream is intermittent toward the end of each void without difficulty emptying his bladder completely. Denies leakage or incontinence. Reports loose bowels. Reports mild fatigue. Questions if there is an alternative for Metamucil. Questioning because of the increasing cost of Metamucil.   BP (!) 123/91 (BP Location: Right Arm, Patient Position: Sitting, Cuff Size: Normal)   Pulse 71   Resp 18   Wt 229 lb 9.6 oz (104.1 kg)   SpO2 100%   BMI 30.29 kg/m  Wt Readings from Last 3 Encounters:  02/20/16 229 lb 9.6 oz (104.1 kg)  02/14/16 229 lb 12.8 oz (104.2 kg)  10/24/15 228 lb 9.6 oz (103.7 kg)

## 2016-02-20 NOTE — Progress Notes (Signed)
  Radiation Oncology         [phone]   Name: David Cantrell MRN: [id number]   Date: 02/20/2016  DOB: Mar 26, 1940     Weekly Radiation Therapy Management    ICD-9-CM ICD-10-CM   1. 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  Weight and vitals stable. Reports rectal pain yesterday that radiated to his groin. Reports he received his initial ADT injection in December. Reports hot flashes and night sweat since injection. Reports nocturia x 1. Denies dysuria or hematuria. Denies urgency or frequency. Describes strong steady urine stream that can be intermittent toward the end of emptying. Denies difficulty emptying his bladdder. Denies incontinence or leakage. Reports loose frequent stools. Reports mild fatigue  Set-up films were reviewed. The chart was checked.  Physical Findings  weight is 229 lb 9.6 oz (104.1 kg). His blood pressure is 123/91 (abnormal) and his pulse is 71. His respiration is 18 and oxygen saturation is 100%. . Weight essentially stable.  No significant changes. Alert and in no acute distress.   Impression The patient is tolerating radiation.   Plan Continue treatment as planned.     Sheral Apley Tammi Klippel, M.D.  This document serves as a record of services personally performed by Tyler Pita, MD. It was created on his behalf by Darcus Austin, a trained medical scribe. The creation of this record is based on the scribe's personal observations and the provider's statements to them. This document has been checked and approved by the attending provider.

## 2016-02-21 ENCOUNTER — Ambulatory Visit
Admission: RE | Admit: 2016-02-21 | Discharge: 2016-02-21 | Disposition: A | Discharge status: Home / Self Care | Payer: PPO | Source: Ambulatory Visit | Attending: Radiation Oncology | Admitting: Radiation Oncology

## 2016-02-21 ENCOUNTER — Ambulatory Visit: Payer: PPO

## 2016-02-21 DIAGNOSIS — C61 Malignant neoplasm of prostate: Secondary | ICD-10-CM | POA: Diagnosis not present

## 2016-02-21 DIAGNOSIS — Z510 Encounter for antineoplastic radiation therapy: Secondary | ICD-10-CM | POA: Diagnosis not present

## 2016-02-24 ENCOUNTER — Ambulatory Visit: Payer: PPO

## 2016-02-24 ENCOUNTER — Ambulatory Visit
Admission: RE | Admit: 2016-02-24 | Discharge: 2016-02-24 | Disposition: A | Discharge status: Home / Self Care | Payer: PPO | Source: Ambulatory Visit | Attending: Radiation Oncology | Admitting: Radiation Oncology

## 2016-02-24 DIAGNOSIS — Z510 Encounter for antineoplastic radiation therapy: Secondary | ICD-10-CM | POA: Diagnosis not present

## 2016-02-24 DIAGNOSIS — C61 Malignant neoplasm of prostate: Secondary | ICD-10-CM | POA: Diagnosis not present

## 2016-02-25 ENCOUNTER — Ambulatory Visit
Admission: RE | Admit: 2016-02-25 | Discharge: 2016-02-25 | Disposition: A | Discharge status: Home / Self Care | Payer: PPO | Source: Ambulatory Visit | Attending: Radiation Oncology | Admitting: Radiation Oncology

## 2016-02-25 ENCOUNTER — Ambulatory Visit: Payer: PPO

## 2016-02-25 DIAGNOSIS — Z510 Encounter for antineoplastic radiation therapy: Secondary | ICD-10-CM | POA: Diagnosis not present

## 2016-02-25 DIAGNOSIS — C61 Malignant neoplasm of prostate: Secondary | ICD-10-CM | POA: Diagnosis not present

## 2016-02-26 ENCOUNTER — Ambulatory Visit
Admission: RE | Admit: 2016-02-26 | Discharge: 2016-02-26 | Disposition: A | Discharge status: Home / Self Care | Payer: PPO | Source: Ambulatory Visit | Attending: Radiation Oncology | Admitting: Radiation Oncology

## 2016-02-26 ENCOUNTER — Ambulatory Visit: Payer: PPO

## 2016-02-26 DIAGNOSIS — Z510 Encounter for antineoplastic radiation therapy: Secondary | ICD-10-CM | POA: Diagnosis not present

## 2016-02-26 DIAGNOSIS — C61 Malignant neoplasm of prostate: Secondary | ICD-10-CM | POA: Diagnosis not present

## 2016-02-27 ENCOUNTER — Ambulatory Visit
Admission: RE | Admit: 2016-02-27 | Discharge: 2016-02-27 | Disposition: A | Discharge status: Home / Self Care | Payer: PPO | Source: Ambulatory Visit | Attending: Radiation Oncology | Admitting: Radiation Oncology

## 2016-02-27 ENCOUNTER — Ambulatory Visit: Payer: PPO

## 2016-02-27 VITALS — BP 134/88 | HR 65 | Resp 18 | Wt 229.2 lb

## 2016-02-27 DIAGNOSIS — Z510 Encounter for antineoplastic radiation therapy: Secondary | ICD-10-CM | POA: Diagnosis not present

## 2016-02-27 DIAGNOSIS — C61 Malignant neoplasm of prostate: Secondary | ICD-10-CM | POA: Diagnosis not present

## 2016-02-27 NOTE — Progress Notes (Signed)
Weight and vitals are stable. Reports occasional rectal pain that radiates to his groin. Reports hot flashes and night sweats since ADT in December. Nocturia x 1. Denies dysuria or hematuria. Reports occasional difficulty emptying his bladder during the night. Denies leakage or incontinence. Reports a small bowel movement last night with bright red blood. Reports a history of hemorrhoids. Reports mild fatigue.   BP 134/88 (BP Location: Right Arm, Patient Position: Sitting, Cuff Size: Normal)   Pulse 65   Resp 18   Wt 229 lb 3.2 oz (104 kg)   SpO2 100%   BMI 30.24 kg/m  Wt Readings from Last 3 Encounters:  02/27/16 229 lb 3.2 oz (104 kg)  02/20/16 229 lb 9.6 oz (104.1 kg)  02/14/16 229 lb 12.8 oz (104.2 kg)

## 2016-02-27 NOTE — Progress Notes (Signed)
  Radiation Oncology         [phone]   Name: David Cantrell MRN: [id number]   Date: 02/27/2016  DOB: June 15, 1940     Weekly Radiation Therapy Management    ICD-9-CM ICD-10-CM   1. 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  Weight and vitals are stable. The patient reports occasional rectal pain that radiates to the groin. He reports mild fatigue. He reports having hot flashed and night sweats since ADT in December. He reports nocturia x 1. He reports occasional difficulty emptying his bladder during the night. The patient denies urinary leakage, incontinence, dysuria, or hematuria.  He reports a small bowel movement last night with bright red blood. He reports a history of hemorrhoids. The patient questions if some of his back pain and muscle soreness could be related to his current radiation treatments.   Set-up films were reviewed. The chart was checked.  Physical Findings  weight is 229 lb 3.2 oz (104 kg). His blood pressure is 134/88 and his pulse is 65. His respiration is 18 and oxygen saturation is 100%.  Weight essentially stable.  No significant changes. Alert and in no acute distress.   Impression The patient is tolerating radiation. I advised the patient that his back pain could be related in part to radiation, but also to other factors. We discussed starting Flomax to alleviate some of the patient's urinary symptoms.  Plan Continue treatment as planned. I encouraged the patient to stay active if this seems to help his muscle soreness and back pain. I will prescribe Flomax today.     Sheral Apley Tammi Klippel, M.D.  This document serves as a record of services personally performed by Tyler Pita, MD. It was created on his behalf by Maryla Morrow, a trained medical scribe. The creation of this record is based on the scribe's personal  observations and the provider's statements to them. This document has been checked and approved by the attending provider.

## 2016-02-28 ENCOUNTER — Ambulatory Visit: Payer: PPO

## 2016-02-28 ENCOUNTER — Ambulatory Visit
Admission: RE | Admit: 2016-02-28 | Discharge: 2016-02-28 | Disposition: A | Discharge status: Home / Self Care | Payer: PPO | Source: Ambulatory Visit | Attending: Radiation Oncology | Admitting: Radiation Oncology

## 2016-02-28 ENCOUNTER — Encounter: Payer: Self-pay | Admitting: Medical Oncology

## 2016-02-28 ENCOUNTER — Other Ambulatory Visit: Payer: Self-pay | Admitting: Urology

## 2016-02-28 ENCOUNTER — Encounter: Payer: Self-pay | Admitting: Radiation Oncology

## 2016-02-28 DIAGNOSIS — C61 Malignant neoplasm of prostate: Secondary | ICD-10-CM | POA: Diagnosis not present

## 2016-02-28 DIAGNOSIS — Z510 Encounter for antineoplastic radiation therapy: Secondary | ICD-10-CM | POA: Diagnosis not present

## 2016-02-28 NOTE — Progress Notes (Signed)
Mr. Audet tolerating radiation well but does have some pain in low back and groin area. He saw Dr. Tammi Klippel today and discussed this with him. He is not sure this is related to radiation but will continue to monitor. I will continue to follow and asked him to call me with any questions or concerns.

## 2016-03-02 ENCOUNTER — Other Ambulatory Visit: Payer: Self-pay | Admitting: Radiation Oncology

## 2016-03-02 ENCOUNTER — Ambulatory Visit: Payer: PPO

## 2016-03-02 ENCOUNTER — Telehealth: Payer: Self-pay | Admitting: Pulmonary Disease

## 2016-03-02 ENCOUNTER — Ambulatory Visit
Admission: RE | Admit: 2016-03-02 | Discharge: 2016-03-02 | Disposition: A | Discharge status: Home / Self Care | Payer: PPO | Source: Ambulatory Visit | Attending: Radiation Oncology | Admitting: Radiation Oncology

## 2016-03-02 ENCOUNTER — Telehealth: Payer: Self-pay | Admitting: *Deleted

## 2016-03-02 ENCOUNTER — Telehealth: Payer: Self-pay | Admitting: Radiation Oncology

## 2016-03-02 DIAGNOSIS — C61 Malignant neoplasm of prostate: Secondary | ICD-10-CM

## 2016-03-02 DIAGNOSIS — Z510 Encounter for antineoplastic radiation therapy: Secondary | ICD-10-CM | POA: Diagnosis not present

## 2016-03-02 MED ORDER — SILODOSIN 8 MG PO CAPS: 8.0000 mg | ORAL_CAPSULE | Freq: Every day | ORAL | 5 refills | Status: DC

## 2016-03-02 NOTE — Telephone Encounter (Signed)
Spoke with pt. He is wanting to know when his last EKG and CXR were. We do not have documentation of an EKG being done. Advised of his last CXR and CT chest. Nothing further was needed.

## 2016-03-02 NOTE — Telephone Encounter (Signed)
CALLED PATIENT TO INFORM OF IMPLANT FOR 04-07-16, SPOKE WITH PATIENT AND HE IS AWARE OF THIS IMPLANT

## 2016-03-02 NOTE — Telephone Encounter (Signed)
Per Dr. Johny Shears order called in Rapaflo 8 mg to Manuela Schwartz, pharmacist @ [facility]. Then, phoned patient's home and spoke with his wife explaining this had been done. She verbalized her intent to pick this up for her husband later today.

## 2016-03-03 ENCOUNTER — Ambulatory Visit
Admission: RE | Admit: 2016-03-03 | Discharge: 2016-03-03 | Disposition: A | Discharge status: Home / Self Care | Payer: PPO | Source: Ambulatory Visit | Attending: Radiation Oncology | Admitting: Radiation Oncology

## 2016-03-03 ENCOUNTER — Ambulatory Visit: Payer: PPO

## 2016-03-03 ENCOUNTER — Encounter: Payer: Self-pay | Admitting: Medical Oncology

## 2016-03-03 DIAGNOSIS — Z510 Encounter for antineoplastic radiation therapy: Secondary | ICD-10-CM | POA: Diagnosis not present

## 2016-03-03 DIAGNOSIS — C61 Malignant neoplasm of prostate: Secondary | ICD-10-CM | POA: Diagnosis not present

## 2016-03-04 ENCOUNTER — Other Ambulatory Visit: Payer: Self-pay | Admitting: Surgery

## 2016-03-04 ENCOUNTER — Other Ambulatory Visit: Payer: Self-pay

## 2016-03-04 ENCOUNTER — Ambulatory Visit: Payer: PPO

## 2016-03-04 ENCOUNTER — Ambulatory Visit
Admission: RE | Admit: 2016-03-04 | Discharge: 2016-03-04 | Disposition: A | Discharge status: Home / Self Care | Payer: PPO | Source: Ambulatory Visit | Attending: Radiation Oncology | Admitting: Radiation Oncology

## 2016-03-04 ENCOUNTER — Ambulatory Visit (HOSPITAL_COMMUNITY)
Admission: RE | Admit: 2016-03-04 | Discharge: 2016-03-04 | Disposition: A | Discharge status: Home / Self Care | Payer: PPO | Source: Ambulatory Visit | Attending: Urology | Admitting: Urology

## 2016-03-04 DIAGNOSIS — Z01818 Encounter for other preprocedural examination: Secondary | ICD-10-CM | POA: Insufficient documentation

## 2016-03-04 DIAGNOSIS — Z01810 Encounter for preprocedural cardiovascular examination: Secondary | ICD-10-CM | POA: Diagnosis not present

## 2016-03-04 DIAGNOSIS — C61 Malignant neoplasm of prostate: Secondary | ICD-10-CM | POA: Insufficient documentation

## 2016-03-04 DIAGNOSIS — R918 Other nonspecific abnormal finding of lung field: Secondary | ICD-10-CM

## 2016-03-04 DIAGNOSIS — Z510 Encounter for antineoplastic radiation therapy: Secondary | ICD-10-CM | POA: Diagnosis not present

## 2016-03-04 DIAGNOSIS — Z8546 Personal history of malignant neoplasm of prostate: Secondary | ICD-10-CM | POA: Diagnosis not present

## 2016-03-05 ENCOUNTER — Telehealth: Payer: Self-pay | Admitting: Medical Oncology

## 2016-03-05 ENCOUNTER — Ambulatory Visit: Payer: PPO

## 2016-03-05 ENCOUNTER — Ambulatory Visit
Admission: RE | Admit: 2016-03-05 | Discharge: 2016-03-05 | Disposition: A | Discharge status: Home / Self Care | Payer: PPO | Source: Ambulatory Visit | Attending: Radiation Oncology | Admitting: Radiation Oncology

## 2016-03-05 DIAGNOSIS — Z510 Encounter for antineoplastic radiation therapy: Secondary | ICD-10-CM | POA: Diagnosis not present

## 2016-03-05 DIAGNOSIS — C61 Malignant neoplasm of prostate: Secondary | ICD-10-CM | POA: Diagnosis not present

## 2016-03-05 NOTE — Progress Notes (Signed)
Nav 

## 2016-03-05 NOTE — Progress Notes (Signed)
Left a message with David Cantrell to inform him that I have reached out to [facility]- Dr. Lyndal Rainbow nurse for samples of Rapaflo. When I hear from her I will contact him. I asked him to call me with any questions or concerns.

## 2016-03-05 NOTE — Progress Notes (Signed)
I called David Cantrell- nurse with Dr. Junious Silk to ask about Rapaflo samples. David Cantrell is having urinary side effects from radiation but he is unable to take Flomax due to glaucoma and he could not afford the cost of the drug. She did have 3 weeks worth. I called David Cantrell and asked him to pick samples up at [facility]. [name]- Dr. Johny Shears nurse notified.

## 2016-03-05 NOTE — Progress Notes (Signed)
Left a message for [facility]- Dr. Lyndal Rainbow nurse inquiring about samples of Rapaflo for David Cantrell. I asked him to call me if they have samples.

## 2016-03-05 NOTE — Progress Notes (Signed)
Received a note from [name]- Dr. Johny Shears nurse asking if I can help get David Cantrell some samples of Rapaflo. He can not take Flomax due to this glaucoma and when he went to purchase the Rapaflo he could not afford the cost. I will reach out to [facility] regarding samples.

## 2016-03-05 NOTE — Progress Notes (Signed)
Mr. Davidoff asking if I have heard back from [place] about samples of Rapaflo. He has received a call and asked if I could follow up for him.

## 2016-03-06 ENCOUNTER — Ambulatory Visit
Admission: RE | Admit: 2016-03-06 | Discharge: 2016-03-06 | Disposition: A | Discharge status: Home / Self Care | Payer: PPO | Source: Ambulatory Visit | Attending: Radiation Oncology | Admitting: Radiation Oncology

## 2016-03-06 ENCOUNTER — Encounter: Payer: Self-pay | Admitting: Radiation Oncology

## 2016-03-06 ENCOUNTER — Ambulatory Visit: Payer: PPO

## 2016-03-06 VITALS — BP 128/83 | HR 66 | Temp 98.2°F | Ht 73.0 in | Wt 228.6 lb

## 2016-03-06 DIAGNOSIS — C61 Malignant neoplasm of prostate: Secondary | ICD-10-CM

## 2016-03-06 DIAGNOSIS — Z510 Encounter for antineoplastic radiation therapy: Secondary | ICD-10-CM | POA: Diagnosis not present

## 2016-03-06 NOTE — Progress Notes (Signed)
  Radiation Oncology         [phone]   Name: David Cantrell MRN: [id number]   Date: 03/06/2016  DOB: 1940-03-26     Weekly Radiation Therapy Management    ICD-9-CM ICD-10-CM   1. 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  David Cantrell has completed 18 fractions to his prostate.  He denies pain, hematuria, or dysuria. He reports his urinary flow is better and said he was having trouble starting his stream.  He started taking rapaflo yesterday and has noticed an improvement.  He reports nocturia x 1 and fatigue.    Set-up films were reviewed. The chart was checked.  Physical Findings  height is 6\' 1"  (1.854 m) and weight is 228 lb 9.6 oz (103.7 kg). His oral temperature is 98.2 F (36.8 C). His blood pressure is 128/83 and his pulse is 66. His oxygen saturation is 100%.  Weight essentially stable.  No significant changes. Alert and in no acute distress.   Impression The patient is tolerating radiation.  Plan Continue treatment as planned. I advised the patient to continue taking his cholesterol medication around the time he has his seed implant. Seed implant is scheduled on 04/07/16.     Sheral Apley Tammi Klippel, M.D.  This document serves as a record of services personally performed by Tyler Pita, MD. It was created on his behalf by Darcus Austin, a trained medical scribe. The creation of this record is based on the scribe's personal observations and the provider's statements to them. This document has been checked and approved by the attending provider.

## 2016-03-06 NOTE — Progress Notes (Addendum)
David Cantrell has completed 18 fractions to his prostate.  He denies having pain.  He reports his urinary flow is better and said he was having trouble starting his stream.  He started taking rapaflo yesterday and has noticed an improvement.  He reports nocturia x 1. He denies having hematuria, dysuria or dysuria.  He reports having fatigue.    BP 128/83 (BP Location: Right Arm, Patient Position: Sitting)   Pulse 66   Temp 98.2 F (36.8 C) (Oral)   Ht 6\' 1"  (1.854 m)   Wt 228 lb 9.6 oz (103.7 kg)   SpO2 100%   BMI 30.16 kg/m    Wt Readings from Last 3 Encounters:  03/06/16 228 lb 9.6 oz (103.7 kg)  02/27/16 229 lb 3.2 oz (104 kg)  02/20/16 229 lb 9.6 oz (104.1 kg)

## 2016-03-09 ENCOUNTER — Ambulatory Visit
Admission: RE | Admit: 2016-03-09 | Discharge: 2016-03-09 | Disposition: A | Discharge status: Home / Self Care | Payer: PPO | Source: Ambulatory Visit | Attending: Radiation Oncology | Admitting: Radiation Oncology

## 2016-03-09 ENCOUNTER — Ambulatory Visit: Payer: PPO

## 2016-03-09 DIAGNOSIS — C61 Malignant neoplasm of prostate: Secondary | ICD-10-CM | POA: Diagnosis not present

## 2016-03-09 DIAGNOSIS — Z510 Encounter for antineoplastic radiation therapy: Secondary | ICD-10-CM | POA: Diagnosis not present

## 2016-03-10 ENCOUNTER — Ambulatory Visit: Payer: PPO

## 2016-03-10 ENCOUNTER — Ambulatory Visit
Admission: RE | Admit: 2016-03-10 | Discharge: 2016-03-10 | Disposition: A | Discharge status: Home / Self Care | Payer: PPO | Source: Ambulatory Visit | Attending: Radiation Oncology | Admitting: Radiation Oncology

## 2016-03-10 DIAGNOSIS — Z510 Encounter for antineoplastic radiation therapy: Secondary | ICD-10-CM | POA: Diagnosis not present

## 2016-03-10 DIAGNOSIS — C61 Malignant neoplasm of prostate: Secondary | ICD-10-CM | POA: Diagnosis not present

## 2016-03-11 ENCOUNTER — Ambulatory Visit
Admission: RE | Admit: 2016-03-11 | Discharge: 2016-03-11 | Disposition: A | Discharge status: Home / Self Care | Payer: PPO | Source: Ambulatory Visit | Attending: Radiation Oncology | Admitting: Radiation Oncology

## 2016-03-11 ENCOUNTER — Ambulatory Visit: Payer: PPO

## 2016-03-11 DIAGNOSIS — C61 Malignant neoplasm of prostate: Secondary | ICD-10-CM | POA: Diagnosis not present

## 2016-03-11 DIAGNOSIS — Z510 Encounter for antineoplastic radiation therapy: Secondary | ICD-10-CM | POA: Diagnosis not present

## 2016-03-12 ENCOUNTER — Ambulatory Visit: Payer: PPO

## 2016-03-12 ENCOUNTER — Ambulatory Visit
Admission: RE | Admit: 2016-03-12 | Discharge: 2016-03-12 | Disposition: A | Discharge status: Home / Self Care | Payer: PPO | Source: Ambulatory Visit | Attending: Radiation Oncology | Admitting: Radiation Oncology

## 2016-03-12 ENCOUNTER — Encounter: Payer: Self-pay | Admitting: Radiation Oncology

## 2016-03-12 VITALS — BP 113/72 | HR 64 | Temp 98.5°F | Resp 20 | Wt 228.8 lb

## 2016-03-12 DIAGNOSIS — C61 Malignant neoplasm of prostate: Secondary | ICD-10-CM | POA: Diagnosis not present

## 2016-03-12 DIAGNOSIS — Z510 Encounter for antineoplastic radiation therapy: Secondary | ICD-10-CM | POA: Diagnosis not present

## 2016-03-12 NOTE — Progress Notes (Signed)
Weekly rad txs prostate 23/25 completed, has hesitancy and slow stream,but emptys  All the way stated, feels pressure at bowel movements, no pain, appetie good 10:15 AM BP 113/72 (BP Location: Left Arm, Patient Position: Sitting, Cuff Size: Normal)   Pulse 64   Temp 98.5 F (36.9 C) (Oral)   Resp 20   Wt 228 lb 12.8 oz (103.8 kg)   BMI 30.19 kg/m   Wt Readings from Last 3 Encounters:  03/12/16 228 lb 12.8 oz (103.8 kg)  03/06/16 228 lb 9.6 oz (103.7 kg)  02/27/16 229 lb 3.2 oz (104 kg)

## 2016-03-12 NOTE — Progress Notes (Signed)
  Radiation Oncology         [phone]   Name: David Cantrell MRN: [id number]   Date: 03/12/2016  DOB: 09-Jul-1940     Weekly Radiation Therapy Management    ICD-9-CM ICD-10-CM   1. Malignant neoplasm of prostate ([place]) 185 C61     Current Dose: 41.1 Gy  Planned Dose:  45 Gy + seed implant  Narrative The patient presents for routine under treatment assessment.  David Cantrell has completed 23 fractions to his prostate.  He reports hesitancy and slow stream, but complete emptying. He notes feeling pressure with bowel movement, but denies pain. He notes a good appetite.  Set-up films were reviewed. The chart was checked.  Physical Findings  weight is 228 lb 12.8 oz (103.8 kg). His oral temperature is 98.5 F (36.9 C). His blood pressure is 113/72 and his pulse is 64. His respiration is 20.  Weight essentially stable.  No significant changes. Alert and in no acute distress.   Impression The patient is tolerating radiation.  Plan Continue treatment as planned. Seed implant is scheduled on 04/07/16.     Sheral Apley Tammi Klippel, M.D.  This document serves as a record of services personally performed by Tyler Pita, MD. It was created on his behalf by Bethann Humble, a trained medical scribe. The creation of this record is based on the scribe's personal observations and the provider's statements to them. This document has been checked and approved by the attending provider.

## 2016-03-13 ENCOUNTER — Ambulatory Visit
Admission: RE | Admit: 2016-03-13 | Discharge: 2016-03-13 | Disposition: A | Discharge status: Home / Self Care | Payer: PPO | Source: Ambulatory Visit | Attending: Radiation Oncology | Admitting: Radiation Oncology

## 2016-03-13 ENCOUNTER — Ambulatory Visit: Payer: PPO

## 2016-03-13 DIAGNOSIS — Z510 Encounter for antineoplastic radiation therapy: Secondary | ICD-10-CM | POA: Diagnosis not present

## 2016-03-13 DIAGNOSIS — C61 Malignant neoplasm of prostate: Secondary | ICD-10-CM | POA: Diagnosis not present

## 2016-03-16 ENCOUNTER — Ambulatory Visit
Admission: RE | Admit: 2016-03-16 | Discharge: 2016-03-16 | Disposition: A | Discharge status: Home / Self Care | Payer: PPO | Source: Ambulatory Visit | Attending: Radiation Oncology | Admitting: Radiation Oncology

## 2016-03-16 ENCOUNTER — Ambulatory Visit: Payer: PPO

## 2016-03-16 DIAGNOSIS — Z510 Encounter for antineoplastic radiation therapy: Secondary | ICD-10-CM | POA: Diagnosis not present

## 2016-03-16 DIAGNOSIS — C61 Malignant neoplasm of prostate: Secondary | ICD-10-CM | POA: Diagnosis not present

## 2016-03-17 ENCOUNTER — Telehealth: Payer: Self-pay | Admitting: Medical Oncology

## 2016-03-17 ENCOUNTER — Ambulatory Visit: Payer: PPO

## 2016-03-17 NOTE — Progress Notes (Signed)
David Cantrell completed radiation treatments yesterday. He has some mild fatigue and his urgency has improved with Rapaflo. He has seed implant scheduled for 04/07/16. I asked him to call me with any questions or concerns.

## 2016-03-18 ENCOUNTER — Encounter: Payer: Self-pay | Admitting: Radiation Oncology

## 2016-03-18 ENCOUNTER — Ambulatory Visit: Payer: PPO

## 2016-03-18 NOTE — Progress Notes (Signed)
  Radiation Oncology         [phone] ________________________________  Name: David Cantrell MRN: [id number]  Date: 03/18/2016  DOB: February 19, 1940  End of Treatment Note  Diagnosis:  76 y.o. 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    Indication for treatment:  Curative      Radiation treatment dates:  02/01/16-03/16/16  Site/dose:  1) Prostate/ 45 Gy in 25 fractions   2) Prostate seed boost/ 110 Gy in 1 fraction - planned  Beams/energy:  1) 3D/ 15X    2) Radioactive seed / Iodine-125 - planned on 04/07/16  Narrative: The patient tolerated radiation treatment relatively well. During treatment, the patient noted hesitancy and a slow stream, but he is able to empty completely. He also noted pressure with bowel movements.  Plan: The patient has completed radiation treatment. The patient will return for seed implant boost on 04/07/16. I advised him to call or return sooner if he has any questions or concerns related to his recovery or treatment. ________________________________  Sheral Apley. Tammi Klippel, M.D.   This document serves as a record of services personally performed by Tyler Pita, MD. It was created on his behalf by Bethann Humble, a trained medical scribe. The creation of this record is based on the scribe's personal observations and the provider's statements to them. This document has been checked and approved by the attending provider.

## 2016-03-19 ENCOUNTER — Ambulatory Visit: Payer: PPO

## 2016-03-20 ENCOUNTER — Ambulatory Visit: Payer: PPO

## 2016-03-23 ENCOUNTER — Ambulatory Visit: Payer: PPO

## 2016-03-24 ENCOUNTER — Ambulatory Visit: Payer: PPO

## 2016-03-25 ENCOUNTER — Ambulatory Visit: Payer: PPO

## 2016-03-26 ENCOUNTER — Ambulatory Visit: Payer: PPO

## 2016-03-27 ENCOUNTER — Ambulatory Visit: Payer: PPO

## 2016-03-30 ENCOUNTER — Telehealth: Payer: Self-pay | Admitting: *Deleted

## 2016-03-30 ENCOUNTER — Ambulatory Visit: Payer: PPO

## 2016-03-30 NOTE — Telephone Encounter (Signed)
Called patient to remind of blood work for 03-31-16 for implant on 04-07-16, spoke with patient and he is aware of this appt.

## 2016-03-31 ENCOUNTER — Ambulatory Visit: Payer: PPO

## 2016-03-31 DIAGNOSIS — Z9889 Other specified postprocedural states: Secondary | ICD-10-CM | POA: Diagnosis not present

## 2016-03-31 DIAGNOSIS — Z87891 Personal history of nicotine dependence: Secondary | ICD-10-CM | POA: Diagnosis not present

## 2016-03-31 DIAGNOSIS — Z8051 Family history of malignant neoplasm of kidney: Secondary | ICD-10-CM | POA: Diagnosis not present

## 2016-03-31 DIAGNOSIS — M1991 Primary osteoarthritis, unspecified site: Secondary | ICD-10-CM | POA: Diagnosis not present

## 2016-03-31 DIAGNOSIS — R911 Solitary pulmonary nodule: Secondary | ICD-10-CM | POA: Diagnosis not present

## 2016-03-31 DIAGNOSIS — Q6100 Congenital renal cyst, unspecified: Secondary | ICD-10-CM | POA: Diagnosis not present

## 2016-03-31 DIAGNOSIS — J45909 Unspecified asthma, uncomplicated: Secondary | ICD-10-CM | POA: Diagnosis not present

## 2016-03-31 DIAGNOSIS — Z885 Allergy status to narcotic agent status: Secondary | ICD-10-CM | POA: Diagnosis not present

## 2016-03-31 DIAGNOSIS — D1771 Benign lipomatous neoplasm of kidney: Secondary | ICD-10-CM | POA: Diagnosis not present

## 2016-03-31 DIAGNOSIS — Z7982 Long term (current) use of aspirin: Secondary | ICD-10-CM | POA: Diagnosis not present

## 2016-03-31 DIAGNOSIS — Z888 Allergy status to other drugs, medicaments and biological substances status: Secondary | ICD-10-CM | POA: Diagnosis not present

## 2016-03-31 DIAGNOSIS — E785 Hyperlipidemia, unspecified: Secondary | ICD-10-CM | POA: Diagnosis not present

## 2016-03-31 DIAGNOSIS — I2510 Atherosclerotic heart disease of native coronary artery without angina pectoris: Secondary | ICD-10-CM | POA: Diagnosis not present

## 2016-03-31 DIAGNOSIS — C61 Malignant neoplasm of prostate: Secondary | ICD-10-CM | POA: Diagnosis not present

## 2016-03-31 DIAGNOSIS — Z87442 Personal history of urinary calculi: Secondary | ICD-10-CM | POA: Diagnosis not present

## 2016-03-31 DIAGNOSIS — H409 Unspecified glaucoma: Secondary | ICD-10-CM | POA: Diagnosis not present

## 2016-03-31 DIAGNOSIS — Z7951 Long term (current) use of inhaled steroids: Secondary | ICD-10-CM | POA: Diagnosis not present

## 2016-03-31 DIAGNOSIS — Z823 Family history of stroke: Secondary | ICD-10-CM | POA: Diagnosis not present

## 2016-03-31 DIAGNOSIS — Z79899 Other long term (current) drug therapy: Secondary | ICD-10-CM | POA: Diagnosis not present

## 2016-03-31 DIAGNOSIS — Z882 Allergy status to sulfonamides status: Secondary | ICD-10-CM | POA: Diagnosis not present

## 2016-03-31 DIAGNOSIS — I10 Essential (primary) hypertension: Secondary | ICD-10-CM | POA: Diagnosis not present

## 2016-03-31 LAB — COMPREHENSIVE METABOLIC PANEL
ALBUMIN: 4.1 g/dL (ref 3.5–5.0)
ALK PHOS: 61 U/L (ref 38–126)
ALT: 30 U/L (ref 17–63)
ANION GAP: 7 (ref 5–15)
AST: 33 U/L (ref 15–41)
BUN: 16 mg/dL (ref 6–20)
CHLORIDE: 109 mmol/L (ref 101–111)
CO2: 24 mmol/L (ref 22–32)
Calcium: 9.7 mg/dL (ref 8.9–10.3)
Creatinine, Ser: 1.36 mg/dL — ABNORMAL HIGH (ref 0.61–1.24)
GFR calc Af Amer: 58 mL/min — ABNORMAL LOW (ref >60)
GFR calc non Af Amer: 50 mL/min — ABNORMAL LOW (ref >60)
GLUCOSE: 110 mg/dL — AB (ref 65–99)
POTASSIUM: 4.7 mmol/L (ref 3.5–5.1)
Sodium: 140 mmol/L (ref 135–145)
Total Bilirubin: 0.9 mg/dL (ref 0.3–1.2)
Total Protein: 7.9 g/dL (ref 6.5–8.1)

## 2016-03-31 LAB — CBC
HEMATOCRIT: 37.6 % — AB (ref 39.0–52.0)
HEMOGLOBIN: 13.6 g/dL (ref 13.0–17.0)
MCH: 32 pg (ref 26.0–34.0)
MCHC: 36.2 g/dL — AB (ref 30.0–36.0)
MCV: 88.5 fL (ref 78.0–100.0)
Platelets: 261 10*3/uL (ref 150–400)
RBC: 4.25 MIL/uL (ref 4.22–5.81)
RDW: 13.2 % (ref 11.5–15.5)
WBC: 7.6 10*3/uL (ref 4.0–10.5)

## 2016-03-31 LAB — PROTIME-INR
INR: 1.08
Prothrombin Time: 14.1 seconds (ref 11.4–15.2)

## 2016-03-31 LAB — APTT: APTT: 28 s (ref 24–36)

## 2016-04-01 ENCOUNTER — Other Ambulatory Visit: Payer: Self-pay | Admitting: Urology

## 2016-04-01 ENCOUNTER — Encounter (HOSPITAL_BASED_OUTPATIENT_CLINIC_OR_DEPARTMENT_OTHER): Payer: Self-pay | Admitting: *Deleted

## 2016-04-01 ENCOUNTER — Ambulatory Visit: Payer: PPO

## 2016-04-01 DIAGNOSIS — K625 Hemorrhage of anus and rectum: Secondary | ICD-10-CM

## 2016-04-01 DIAGNOSIS — K627 Radiation proctitis: Secondary | ICD-10-CM

## 2016-04-01 MED ORDER — HYDROCORTISONE ACETATE 25 MG RE SUPP: 25.0000 mg | Freq: Two times a day (BID) | RECTAL | 0 refills | Status: DC

## 2016-04-02 ENCOUNTER — Ambulatory Visit: Payer: PPO

## 2016-04-02 ENCOUNTER — Telehealth: Payer: Self-pay | Admitting: Radiation Oncology

## 2016-04-02 ENCOUNTER — Encounter (HOSPITAL_BASED_OUTPATIENT_CLINIC_OR_DEPARTMENT_OTHER): Payer: Self-pay | Admitting: *Deleted

## 2016-04-02 NOTE — Telephone Encounter (Signed)
Phoned patient per Ashlyn's order. Explained that radiation is the likely cause of his rectal irritation. Informed the patient that Proctosol suppositories have been Rx to [facility] pharmacy. Patient reports his druggist phoned this morning to inform him insurance wouldn't cover the Proctosol. Patient reports he is unable to afford the medication without assistance from insurance. Patient goes onto explain he purchased preparation H and hydrocortisone based upon the advise of his druggist and began using it immediately. Patient understands this RN will inform Dr. Tammi Klippel of this finding and phone him back if further instructions are needed. Encouraged patient to continue to prepare for his implant as originally planned. Patient verbalized understanding and expressed appreciation for the call back.

## 2016-04-02 NOTE — Telephone Encounter (Signed)
-----   Message from Freeman Caldron, [place] sent at 04/01/2016  4:43 PM EDT ----- Regarding: RE: Rectal Bleeding Contact: [phone] [name], Sorry for the confusion!  I have cancelled the referral to GI and sent Rx for Proctosol suppositories to [name]. Please inform patient that this is most likely related to the recent EBRT to the prostate causing some irritation/inflammation in the rectum.  He should pick up the Rx and begin using tonight.  This should allow the rectal irritation to heal quickly. Thanks! -Ash ----- Message ----- From: Tyler Pita, MD Sent: 04/01/2016   4:24 PM To: Freeman Caldron, PA-C, # Subject: RE: Rectal Bleeding                            Alternatively, we could try Proctoscol HC (hydrocortisone) suppositories BID for the next week and see if a little time and anti-inflmammatory turns it around?  GI consult is a good idea, but, in the setting of acute radiation proctitis, they may not have a lot to add to our supportive care.   ----- Message ----- From: Freeman Caldron, PA-C Sent: 04/01/2016   4:15 PM To: Tyler Pita, MD, # Subject: RE: Rectal Bleeding                            [name], Please let him know that he should have this evaluated with GI ASAP.  I will put in a stat referral to GI in hopes that this can be completed prior to his scheduled procedure. Thanks, -Lia Foyer ----- Message ----- From: Heywood Footman, RN Sent: 04/01/2016   9:16 AM To: Tyler Pita, MD, Freeman Caldron, PA-C, # Subject: Rectal Bleeding                                Patient phoned today concerned that new onset of rectal bleeding would interfere with seed implant scheduled for 3/27. Patient reports seeing bright red blood on the tissue when he wipes x 1 week. Denies any blood in the commode water or clots. Reports for 2 days within that week he had no bleeding with bowel movements. Reports a hx of hemorrhoids. Reports his stool is formed but, not hard. Denies any  straining to defecate.  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    Indication for treatment:  Curative      Radiation treatment dates:  02/01/16-03/16/16  Site/dose:  1) Prostate/ 45 Gy in 25 fractions                         2) Prostate seed boost/ 110 Gy in 1 fraction - planned  Beams/energy:  1) 3D/ 15X                                     2) Radioactive seed / Iodine-125 - planned on 04/07/16

## 2016-04-02 NOTE — Progress Notes (Signed)
NPO AFTER MN.  ARRIVE AT 0915.  CURRENT LAB RESULTS, EKG, CXR AND IN CHART AND EPIC.  WILL TAKE METOPROLOL AND FENOFIBRATE AM DOS W/ SIPS OF WATER AND WILL DO FLEET ENEMA.

## 2016-04-03 ENCOUNTER — Ambulatory Visit: Payer: PPO

## 2016-04-05 NOTE — H&P (Signed)
Office Visit Report     03/16/2016    Mora Appl. Cudd         MRN: [id number]  PRIMARY CARE:  Billey Gosling, MD  DOB: [birth year], 76 year old Male  REFERRING:  Georgette Dover, MD  SSN:   PROVIDER:  Festus Aloe, M.D.    TREATING:  Jiles Crocker    LOCATION:  [facility] [phone]    CC/HPI: He completed IMRT (25 doses) today. He is scheduled to have brachytherapy seeds placed at the end of this month. He has also had ADT with Lupron. He has prostate cancer and is here for pre-op PE.   Path: T2aNxMx  PSA 4.7  24 grams  Gleason 3+4=7, 4 cores, left and right - 20-90%, (pattern 4, 2-40%)  Gleason 3+3=6 2 cores, left and right, 5 - 60%    Not having fevers, constipation, dysuria or hematuria. Denies pelvic pain. His main complaint is of urine hesitancy and weak stream. He remains on rapaflo.   ALLERGIES: Altace Codeine Derivatives Lipitor Morphine Sulfate TBSO Niaspan TBCR Sulfa Drugs   MEDICATIONS: Rapaflo 8 mg capsule  Aleve 220 mg tablet Oral  Allegra 60 mg capsule Oral  Aspirin Ec 81 mg tablet, delayed release Oral  Brimonidine Tartrate 0.15 % drops Ophthalmic  Co Q-10 100 mg-5 unit capsule Oral  Cosopt 22.3 mg-6.8 mg/ml drops Ophthalmic  Crestor 10 mg tablet Oral  Fenofibrate 200 mg capsule Oral  Fish Oil CAPS Oral  Flax Seed Oil 1,300 mg-670 mg-155 mg-175 mg-100 mg capsule Oral  Garlic CAPS Oral  Lupron Depot 45 mg (6 month) syringe kit 45 mg IM Administered by: Alcide Goodness  Metoprolol Tartrate 25 mg tablet Oral  Nitroglycerin 0.4 MG Sublingual Tablet Sublingual Sublingual  Proair Hfa 90 mcg hfa aerosol with adapter Inhalation    GU PSH: Cysto Uretero Lithotripsy - 2015 Cystoscopy Insert Stent - 2015, 2015 PLACE RT DEVICE/MARKER, PROS - 12/04/2015 Prostate Needle Biopsy - 09/26/2015     PSH Notes: Cystoscopy With Insertion Of Ureteral Stent Right, Cystoscopy With Ureteroscopy With Lithotripsy, Cystoscopy With Insertion Of Ureteral Stent  Right, Hemorrhoidectomy, Shoulder Surgery Left, Shoulder Surgery Right   NON-GU PSH: Hemorrhoidectomy (favorite) - 2014 Surgical Pathology, Gross And Microscopic Examination For Prostate Needle - 09/26/2015   GU PMH: Pelvic and perineal pain - 12/19/2015 Prostate Cancer - 12/19/2015, - 12/04/2015, - 10/18/2015 Elevated PSA - 09/26/2015, - 08/07/2015, PSA elevation, - 05/20/2015 Nodular prostate w/o LUTS - 09/26/2015, - 08/07/2015, Prostate nodule, - 04/04/2015 Benign Neo Left Kidney, 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 Kidney Stone, Nephrolithiasis - 04/04/2015 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, unspecified, Organic impotence - 2015 Oth urine abnormal findings, Uricosuria - 2015 Disorder of Penis Ot, Penile pain - 2015 Urinary Urgency, Urinary urgency - 2015 Urinary Tract Inf, Unspec site, Pyuria - 2015   NON-GU PMH: Congenital renal cyst, unspecified, 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 Encounter for general adult medical examination without abnormal findings, Encounter for preventive health examination - 04/04/2015 Unspecified disorder of calcium metabolism, Hypercalciuria - 2015 Solitary pulmonary nodule, Pulmonary nodule - 2015 Atherosclerotic heart disease of native coronary artery without angina pectoris, Coronary artery disease - 2014 Personal history of other diseases of the circulatory system, History of angina pectoris - 2014, History of hypertension, - 2014 Personal history of other diseases of the musculoskeletal system and connective tissue, History of arthritis - 2014 Personal history of other diseases of the nervous system and sense organs, History of glaucoma - 2014 Personal history of other  diseases of the respiratory system, History of asthma - 27-Mar-2012 Personal history of other endocrine, nutritional and metabolic disease, History of hyperlipidemia - 27-Mar-2012   FAMILY HISTORY: Death of family member - Runs In Family Kidney Stones - Runs In Family malignant neoplasm of urinary bladder - Runs In Family Strokes  - Runs In Family   SOCIAL HISTORY: Marital Status: Married Current Smoking Status: Patient does not smoke anymore.  Does drink.  Does not drink caffeine.    Notes: Number of children, Former smoker, Caffeine use, Alcohol use, Retired, Married   REVIEW OF SYSTEMS:    GU Review Male:   Patient denies frequent urination, hard to postpone urination, burning/ pain with urination, get up at night to urinate, leakage of urine, stream starts and stops, trouble starting your stream, have to strain to urinate , erection problems, and penile pain.  Gastrointestinal (Upper):   Patient denies nausea, vomiting, and indigestion/ heartburn.  Gastrointestinal (Lower):   Patient reports diarrhea. Patient denies constipation.  Constitutional:   Patient reports night sweats. Patient denies fever, weight loss, and fatigue.  Skin:   Patient denies skin rash/ lesion and itching.  Eyes:   Patient reports blurred vision. Patient denies double vision.  Ears/ Nose/ Throat:   Patient reports sinus problems. Patient denies sore throat.  Hematologic/Lymphatic:   Patient denies swollen glands and easy bruising.  Cardiovascular:   Patient denies leg swelling and chest pains.  Respiratory:   Patient reports shortness of breath. Patient denies cough.  Endocrine:   Patient denies excessive thirst.  Musculoskeletal:   Patient reports back pain. Patient denies joint pain.  Neurological:   Patient reports dizziness. Patient denies headaches.  Psychologic:   Patient denies depression and anxiety.   VITAL SIGNS:      03/16/2016 02:40 PM  Weight 230 lb / 104.33 kg  Height 73 in / 185.42 cm  BP 124/79 mmHg  Pulse 73 /min  Temperature 97.6 F / 36 C  BMI 30.3 kg/m   MULTI-SYSTEM PHYSICAL EXAMINATION:    Constitutional: Well-nourished. No physical deformities. Normally developed. Good grooming.  Neck: Neck symmetrical, not swollen. Normal tracheal position.  Respiratory: No labored breathing, no use of accessory muscles.  CTA.  Cardiovascular: Normal temperature, normal extremity pulses, no swelling, no varicosities. RRR.  Skin: No paleness, no jaundice, no cyanosis. No lesion, no ulcer, no rash.  Neurologic / Psychiatric: Oriented to time, oriented to place, oriented to person. No depression, no anxiety, no agitation.  Gastrointestinal: No hernia. No mass, no tenderness, no rigidity, non obese abdomen. No flank or suprapubic tenderness.  Musculoskeletal: Spine, ribs, pelvis no bilateral tenderness. Normal gait and station of head and neck.    PAST DATA REVIEWED:  Source Of History:  Patient, Family/Caregiver  Records Review:   Pathology Reports, Previous Patient Records  Urine Test Review:   Urinalysis   08/01/15 05/18/15 04/05/15  PSA  Total PSA 4.7  4.33  4.61   Free PSA 0.45  0.38    % Free PSA [id number]  9      03/16/16  Urinalysis  Urine Appearance Clear   Urine Color Yellow   Urine Glucose Neg   Urine Bilirubin Neg   Urine Ketones Neg   Urine Specific Gravity 1.015   Urine Blood Neg   Urine pH 5.5   Urine Protein Neg   Urine Urobilinogen 0.2   Urine Nitrites Neg   Urine Leukocyte Esterase Neg    PROCEDURES:  Urinalysis Dipstick Dipstick Cont'd  Color: Yellow Bilirubin: Neg  Appearance: Clear Ketones: Neg  Specific Gravity: 1.015 Blood: Neg  pH: 5.5 Protein: Neg  Glucose: Neg Urobilinogen: 0.2    Nitrites: Neg    Leukocyte Esterase: Neg   ASSESSMENT:      ICD-10 Details  1 GU:   Prostate Cancer - C61    PLAN:          Orders Labs Urine Culture and Sensitivity         Schedule Return Visit/Planned Activity: Keep Scheduled Appointment - Schedule Surgery         Document Letter(s):  Created for Patient: Clinical Summary        Notes:   PE wnl. UA does not indicate infection but I will culture with upcoming procedure pending. He is doing Remarkably well considering the treatments for prostate cancer he is already received. At this point I'll have any limitations  to delaying seed placement later this month. Answered all questions to the best of my ability from him and his wife. Told to call if there are any additional concerns.   * Signed by Jiles Crocker on 03/16/16 at 3:18 PM (EST)*     The information contained in this medical record document is considered private and confidential patient information. This information can only be used for the medical diagnosis and/or medical services that are being provided by the patient's selected caregivers. This information can only be distributed outside of the patient's care if the patient agrees and signs waivers of authorization for this information to be sent to an outside source or route.

## 2016-04-06 ENCOUNTER — Ambulatory Visit (INDEPENDENT_AMBULATORY_CARE_PROVIDER_SITE_OTHER): Payer: PPO | Admitting: Physician Assistant

## 2016-04-06 ENCOUNTER — Ambulatory Visit: Payer: PPO

## 2016-04-06 ENCOUNTER — Telehealth: Payer: Self-pay | Admitting: *Deleted

## 2016-04-06 ENCOUNTER — Encounter: Payer: Self-pay | Admitting: Physician Assistant

## 2016-04-06 VITALS — BP 132/82 | HR 78 | Resp 18 | Ht 73.0 in | Wt 231.0 lb

## 2016-04-06 DIAGNOSIS — Z86010 Personal history of colonic polyps: Secondary | ICD-10-CM | POA: Diagnosis not present

## 2016-04-06 DIAGNOSIS — K625 Hemorrhage of anus and rectum: Secondary | ICD-10-CM | POA: Diagnosis not present

## 2016-04-06 NOTE — Telephone Encounter (Signed)
CALLED PATIENT TO REMIND OF PROCEDURE FOR IMPLANT ON 04-07-16, SPOKE WITH PATIENT AND HE IS AWARE OF THIS PROCEDURE

## 2016-04-06 NOTE — Progress Notes (Signed)
  Radiation Oncology         [phone] ________________________________  Name: David Cantrell MRN: [id number]  Date: 04/07/2016  DOB: March 03, 1940       Prostate Seed Implant  UX:LKGMW [name], MD  No ref. provider found  DIAGNOSIS: 76 y.o. 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    ICD-9-CM ICD-10-CM   1. Prostate cancer ([place]) 185 C61 DG Chest 2 View     DG Chest 2 View     Discharge patient    PROCEDURE: Insertion of radioactive I-125 seeds into the prostate gland.  RADIATION DOSE: 110 Gy, boost therapy.  TECHNIQUE: David Cantrell was brought to the operating room with the urologist. He was placed in the dorsolithotomy position. He was catheterized and a rectal tube was inserted. The perineum was shaved, prepped and draped. The ultrasound probe was then introduced into the rectum to see the prostate gland.  TREATMENT DEVICE: A needle grid was attached to the ultrasound probe stand and anchor needles were placed.  3D PLANNING: The prostate was imaged in 3D using a sagittal sweep of the prostate probe. These images were transferred to the planning computer. There, the prostate, urethra and rectum were defined on each axial reconstructed image. Then, the software created an optimized 3D plan and a few seed positions were adjusted. The quality of the plan was reviewed using [facility] information for the target and the following two organs at risk:  Urethra and Rectum.  Then the accepted plan was uploaded to the seed Selectron afterloading unit.  PROSTATE VOLUME STUDY:  Using transrectal ultrasound the volume of the prostate was verified to be 26 cc.  SPECIAL TREATMENT PROCEDURE/SUPERVISION AND HANDLING: The Nucletron FIRST system was used to place the needles under sagittal guidance. A total of 17 needles were used to deposit 55 seeds in the prostate gland. The individual seed activity was 0.290 mCi.  COMPLEX SIMULATION: At the end of the  procedure, an anterior radiograph of the pelvis was obtained to document seed positioning and count. Cystoscopy was performed to check the urethra and bladder.  MICRODOSIMETRY: At the end of the procedure, the patient was emitting 0.12 mR/hr at 1 meter. Accordingly, he was considered safe for hospital discharge.  PLAN: The patient will return to the radiation oncology clinic for post implant CT dosimetry in three weeks.   ________________________________  Sheral Apley Tammi Klippel, M.D.

## 2016-04-06 NOTE — Patient Instructions (Signed)
Continue preperation H suppositories at bedtime and as needed.   Call back after your procedure tomorrow and ask to speak to [facility]).

## 2016-04-06 NOTE — Progress Notes (Addendum)
Subjective:    Patient ID: David Cantrell, male    DOB: [birth year], 76 y.o.   MRN: [id number]  HPI David Cantrell is a very nice 76 year old white male known to Dr. Hilarie Cantrell who comes in today with complaints of rectal bleeding. Patient has history of hypertension, a ascending and descending aortic aneurysm (last measured at 4.4 cm September 2017), history of bronchiectasis and COPD. Patient has been diagnosed with prostate cancer and has completed radiation therapy about 3 weeks ago. He is scheduled for radiation seed implants tomorrow. Patient says he had been doing well but started noticing some bright red blood on with bowel movements, on the tissue about the time he finished radiation. This had been occurring intermittently over the past couple of weeks. He did get some over-the-counter hemorrhoidal suppositories and thought perhaps that had helped. Today he had a bowel movement with increased amount of bright red blood again still just on the tissue. He has no complaints of abdominal pain, he has had some internal rectal discomfort today but says that he hasn't had any ongoing rectal pain or urgency. His bowel movements have been normal. He does have history of prior hemorrhoidectomy per Dr. Rosana Cantrell many years ago. Patient says she's been feeling poorly in general since radiation therapy and has been very fatigued. Last labs on 03/31/2016 hemoglobin 13.6/hematocrit 37.6. Last colonoscopy done in May 2017 for history of adenomatous polyps Patient had 6 polyps removed the largest 12 mm, biopsies proved all of these to be tubular adenomas, he also has multiple diverticuli. He is slated for 3 year follow-up.  Review of Systems Pertinent positive and negative review of systems were noted in the above HPI section.  All other review of systems was otherwise negative.  Outpatient Encounter Prescriptions as of 04/06/2016  Medication Sig  . acetaminophen (TYLENOL) 500 MG tablet Take 1,000 mg by mouth every 6 (six)  hours as needed.  Marland Kitchen albuterol (PROVENTIL HFA;VENTOLIN HFA) 108 (90 BASE) MCG/ACT inhaler Inhale 2 puffs into the lungs every 6 (six) hours as needed for wheezing or shortness of breath.  Marland Kitchen aspirin EC 81 MG tablet Take 81 mg by mouth daily.   . brimonidine (ALPHAGAN) 0.15 % ophthalmic solution Place 1 drop into the right eye 2 (two) times daily.   . Coenzyme Q10 (COQ10) 100 MG CAPS Take 1 capsule by mouth daily.  Marland Kitchen dextromethorphan-guaiFENesin (MUCINEX DM) 30-600 MG 12hr tablet Take 1 tablet by mouth every morning.   . dorzolamide-timolol (COSOPT) 22.3-6.8 MG/ML ophthalmic solution Place 1 drop into the right eye 2 (two) times daily.  . fenofibrate 160 MG tablet Take 160 mg by mouth every morning.   . Flaxseed, Linseed, 1300 MG CAPS Take by mouth 2 (two) times daily with a meal.  . Fluticasone-Salmeterol (ADVAIR DISKUS) 250-50 MCG/DOSE AEPB Inhale 1 puff into the lungs 2 (two) times daily. (Patient taking differently: Inhale 1 puff into the lungs 2 (two) times daily as needed. )  . GARLIC PO Take 1 capsule by mouth daily.  . hydrocortisone ointment 0.5 % Apply 1 application topically 2 (two) times daily.  . metoprolol tartrate (LOPRESSOR) 25 MG tablet Take 1 tablet (25 mg total) by mouth 2 (two) times daily.  . Multiple Vitamins-Minerals (MULTIVITAMIN ADULTS PO) Take 1 tablet by mouth daily.   Marland Kitchen NITROSTAT 0.4 MG SL tablet Place 0.4 mg under the tongue every 5 (five) minutes as needed.   . Omega-3 Fatty Acids (FISH OIL) 1200 MG CAPS Take 1 capsule by mouth 2 (  two) times daily. 1 by mouth daily  . psyllium (METAMUCIL) 58.6 % packet Take 1 packet by mouth daily.  . rosuvastatin (CRESTOR) 10 MG tablet Take 10 mg by mouth 2 (two) times a week. Monday and thursday  . shark liver oil-cocoa butter (PREPARATION H) 0.25-88.44 % suppository Place 1 suppository rectally as needed for hemorrhoids.   No facility-administered encounter medications on file as of 04/06/2016.    Allergies  Allergen Reactions  .  Lipitor [Atorvastatin] Other (See Comments)    MUSCLE ACHES  . Morphine And Related Other (See Comments)    Severe headache  . Niacin And Related Other (See Comments)    FLUSHING  . Ramipril Other (See Comments)    COUGH  . Sulfa Antibiotics Hives  . Codeine Other (See Comments)    CONSTIPATION   Patient Active Problem List   Diagnosis Date Noted  . Malignant neoplasm of prostate ([place]) 01/31/2016  . Acute maxillary sinusitis 06/19/2015  . Tooth infection 06/19/2015  . AAA (abdominal aortic aneurysm) without rupture ([place]) 03/15/2015  . Centrilobular emphysema ([place]) 12/12/2014  . Bronchiectasis without acute exacerbation ([place]) 09/11/2014  . Bulging lumbar disc 10/03/2013  . Open-angle glaucoma 05/05/2013  . 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 Multiple pulmonary nodules 02/24/2013  . Abnormal CT scan, kidney 02/24/2013  . Basal cell carcinoma of ala nasi 02/24/2013  . At risk for obstructive sleep apnea 02/09/2013  . 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 COLONIC POLYPS, HX OF 12/13/2007  . Hyperlipidemia 12/03/2006  . Essential hypertension 12/03/2006  . Coronary atherosclerosis 12/03/2006  . 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 Nevus, non-neoplastic 09/24/2006  . RHINITIS, ALLERGIC NOS 09/24/2006  . Exercise-induced bronchospasm 09/24/2006   Social History   Social History  . Marital status: Married    Spouse name: N/A  . Number of children: N/A  . Years of education: N/A   Occupational History  . Not on file.   Social History Main Topics  . Smoking status: Former Smoker    Packs/day: 2.00    Years: 3.00    Types: Cigarettes    Quit date: 01/12/1970  . Smokeless tobacco: Never Used  . Alcohol use No  . Drug use: No  . Sexual activity: Not on file   Other Topics Concern  . Not on file   Social History Narrative  . No narrative on file    [name] family history includes Cancer in his brother, cousin, father, and other;  Colon cancer in his father; Diabetes in his paternal uncle; Heart attack (age of onset: 2) in his brother; Stroke in his father.      Objective:    Vitals:   04/06/16 1439  BP: 132/82  Pulse: 78  Resp: 18    Physical Exam  well-developed older white male in no acute distress, very pleasant accompanied by his wife, blood pressure 132/82 pulse 78, BMI 30.48. HEENT; nontraumatic normocephalic EOMI PERRLA sclera anicteric, Cardiovascular ;regular rate and rhythm with S1-S2 no murmur rub or gallop, Pulmonary ;clear bilaterally, Abdomen; soft, nontender nondistended bowel sounds are active there is no palpable mass or hepatosplenomegaly, Rectal; exam not done, Extremities ;no clubbing cyanosis or edema skin warm and dry, Neuropsych mood and affect appropriate       Assessment & Plan:   #61 76 year old white male with intermittent bright red blood per rectum 3 weeks. Bleeding started about the time he finished radiation therapy for prostate cancer. 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 He would like to get through his radiation seed implant procedure first, and see if Dr. Tammi Klippel and Dr. Junious Silk are able to assess evidence of radiation proctitis at that time. He was reassured that his hemoglobin is normal, and that his bleeding has been small volume. 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 He will continue Preparation H suppositories daily at bedtime for 7 days if he has bleeding and then when necessary He  will likely need colonoscopy with Dr. Hilarie Cantrell, to differentiate internal hemorrhoidal bleeding versus radiation proctitis. We would schedule the procedure to be done at [facility] so that radiation proctitis could be treated with APC if indicated. He and his wife will call back, he would like to wait a couple of weeks to see if bleeding persists and at that point we would go ahead and schedule for colonoscopy as outlined above.   Daylee Delahoz S Henritta Mutz PA-C 04/06/2016   Cc: [name], MD   Addendum: Reviewed and agree with initial management. Jerene Bears, MD

## 2016-04-07 ENCOUNTER — Ambulatory Visit (HOSPITAL_BASED_OUTPATIENT_CLINIC_OR_DEPARTMENT_OTHER): Payer: PPO | Admitting: Anesthesiology

## 2016-04-07 ENCOUNTER — Encounter (HOSPITAL_BASED_OUTPATIENT_CLINIC_OR_DEPARTMENT_OTHER): Payer: Self-pay | Admitting: *Deleted

## 2016-04-07 ENCOUNTER — Ambulatory Visit (HOSPITAL_COMMUNITY): Payer: PPO

## 2016-04-07 ENCOUNTER — Ambulatory Visit (HOSPITAL_BASED_OUTPATIENT_CLINIC_OR_DEPARTMENT_OTHER)
Admission: RE | Admit: 2016-04-07 | Discharge: 2016-04-07 | Disposition: A | Discharge status: Home / Self Care | Payer: PPO | Source: Ambulatory Visit | Attending: Urology | Admitting: Urology

## 2016-04-07 ENCOUNTER — Encounter (HOSPITAL_BASED_OUTPATIENT_CLINIC_OR_DEPARTMENT_OTHER)
Admission: RE | Disposition: A | Discharge status: Home / Self Care | Payer: Self-pay | Source: Ambulatory Visit | Attending: Urology

## 2016-04-07 DIAGNOSIS — I10 Essential (primary) hypertension: Secondary | ICD-10-CM | POA: Insufficient documentation

## 2016-04-07 DIAGNOSIS — Q6100 Congenital renal cyst, unspecified: Secondary | ICD-10-CM | POA: Insufficient documentation

## 2016-04-07 DIAGNOSIS — I2510 Atherosclerotic heart disease of native coronary artery without angina pectoris: Secondary | ICD-10-CM | POA: Diagnosis not present

## 2016-04-07 DIAGNOSIS — Z87442 Personal history of urinary calculi: Secondary | ICD-10-CM | POA: Diagnosis not present

## 2016-04-07 DIAGNOSIS — E785 Hyperlipidemia, unspecified: Secondary | ICD-10-CM | POA: Diagnosis not present

## 2016-04-07 DIAGNOSIS — M1991 Primary osteoarthritis, unspecified site: Secondary | ICD-10-CM | POA: Diagnosis not present

## 2016-04-07 DIAGNOSIS — Z87891 Personal history of nicotine dependence: Secondary | ICD-10-CM | POA: Diagnosis not present

## 2016-04-07 DIAGNOSIS — Z7982 Long term (current) use of aspirin: Secondary | ICD-10-CM | POA: Insufficient documentation

## 2016-04-07 DIAGNOSIS — C61 Malignant neoplasm of prostate: Secondary | ICD-10-CM | POA: Diagnosis not present

## 2016-04-07 DIAGNOSIS — D1771 Benign lipomatous neoplasm of kidney: Secondary | ICD-10-CM | POA: Insufficient documentation

## 2016-04-07 DIAGNOSIS — J309 Allergic rhinitis, unspecified: Secondary | ICD-10-CM | POA: Diagnosis not present

## 2016-04-07 DIAGNOSIS — J45909 Unspecified asthma, uncomplicated: Secondary | ICD-10-CM | POA: Diagnosis not present

## 2016-04-07 DIAGNOSIS — Z8051 Family history of malignant neoplasm of kidney: Secondary | ICD-10-CM | POA: Insufficient documentation

## 2016-04-07 DIAGNOSIS — Z79899 Other long term (current) drug therapy: Secondary | ICD-10-CM | POA: Insufficient documentation

## 2016-04-07 DIAGNOSIS — R911 Solitary pulmonary nodule: Secondary | ICD-10-CM | POA: Insufficient documentation

## 2016-04-07 DIAGNOSIS — H409 Unspecified glaucoma: Secondary | ICD-10-CM | POA: Diagnosis not present

## 2016-04-07 DIAGNOSIS — Z885 Allergy status to narcotic agent status: Secondary | ICD-10-CM | POA: Insufficient documentation

## 2016-04-07 DIAGNOSIS — Z7951 Long term (current) use of inhaled steroids: Secondary | ICD-10-CM | POA: Insufficient documentation

## 2016-04-07 DIAGNOSIS — Z882 Allergy status to sulfonamides status: Secondary | ICD-10-CM | POA: Insufficient documentation

## 2016-04-07 DIAGNOSIS — Z823 Family history of stroke: Secondary | ICD-10-CM | POA: Insufficient documentation

## 2016-04-07 DIAGNOSIS — Z9889 Other specified postprocedural states: Secondary | ICD-10-CM | POA: Insufficient documentation

## 2016-04-07 DIAGNOSIS — Z888 Allergy status to other drugs, medicaments and biological substances status: Secondary | ICD-10-CM | POA: Insufficient documentation

## 2016-04-07 HISTORY — PX: CYSTOSCOPY: SHX5120

## 2016-04-07 HISTORY — DX: Presence of dental prosthetic device (complete) (partial): Z97.2

## 2016-04-07 HISTORY — DX: Diverticulosis of large intestine without perforation or abscess without bleeding: K57.30

## 2016-04-07 HISTORY — DX: Personal history of other malignant neoplasm of skin: Z85.828

## 2016-04-07 HISTORY — DX: Congenital renal cyst, unspecified: Q61.00

## 2016-04-07 HISTORY — DX: Centrilobular emphysema: J43.2

## 2016-04-07 HISTORY — DX: Bronchiectasis, uncomplicated: J47.9

## 2016-04-07 HISTORY — PX: RADIOACTIVE SEED IMPLANT: SHX5150

## 2016-04-07 HISTORY — DX: Personal history of other malignant neoplasm of skin: Z98.890

## 2016-04-07 HISTORY — DX: Unspecified hemorrhoids: K64.9

## 2016-04-07 HISTORY — DX: Other forms of dyspnea: R06.09

## 2016-04-07 HISTORY — DX: Personal history of urinary calculi: Z87.442

## 2016-04-07 HISTORY — DX: Thoracic aortic aneurysm, without rupture: I71.2

## 2016-04-07 HISTORY — DX: Personal history of irradiation: Z92.3

## 2016-04-07 HISTORY — DX: Other nonspecific abnormal finding of lung field: R91.8

## 2016-04-07 HISTORY — DX: Thoracic aortic aneurysm, without rupture, unspecified: I71.20

## 2016-04-07 SURGERY — INSERTION, RADIATION SOURCE, PROSTATE
Anesthesia: General

## 2016-04-07 MED ORDER — FENTANYL CITRATE (PF) 100 MCG/2ML IJ SOLN
INTRAMUSCULAR | Status: AC
  Filled 2016-04-07: qty 2

## 2016-04-07 MED ORDER — IOHEXOL 300 MG/ML  SOLN
INTRAMUSCULAR | Status: DC | PRN
  Administered 2016-04-07: 7 mL

## 2016-04-07 MED ORDER — LIDOCAINE 2% (20 MG/ML) 5 ML SYRINGE
INTRAMUSCULAR | Status: DC | PRN
  Administered 2016-04-07: 80 mg via INTRAVENOUS

## 2016-04-07 MED ORDER — ONDANSETRON HCL 4 MG/2ML IJ SOLN
INTRAMUSCULAR | Status: AC
  Filled 2016-04-07: qty 2

## 2016-04-07 MED ORDER — DEXAMETHASONE SODIUM PHOSPHATE 10 MG/ML IJ SOLN
INTRAMUSCULAR | Status: AC
  Filled 2016-04-07: qty 1

## 2016-04-07 MED ORDER — FENTANYL CITRATE (PF) 100 MCG/2ML IJ SOLN
INTRAMUSCULAR | Status: DC | PRN
  Administered 2016-04-07: 50 ug via INTRAVENOUS
  Administered 2016-04-07 (×2): 25 ug via INTRAVENOUS
  Administered 2016-04-07: 50 ug via INTRAVENOUS
  Administered 2016-04-07: 25 ug via INTRAVENOUS

## 2016-04-07 MED ORDER — HYDROMORPHONE HCL 1 MG/ML IJ SOLN
0.2500 mg | INTRAMUSCULAR | Status: DC | PRN
  Filled 2016-04-07: qty 0.5

## 2016-04-07 MED ORDER — LACTATED RINGERS IV SOLN
INTRAVENOUS | Status: DC
  Administered 2016-04-07 (×2): via INTRAVENOUS
  Filled 2016-04-07: qty 1000

## 2016-04-07 MED ORDER — FLEET ENEMA 7-19 GM/118ML RE ENEM
1.0000 | ENEMA | Freq: Once | RECTAL | Status: AC
Start: 2016-04-08 — End: 2016-04-07
  Administered 2016-04-07: 1 via RECTAL
  Filled 2016-04-07: qty 1

## 2016-04-07 MED ORDER — EPHEDRINE SULFATE-NACL 50-0.9 MG/10ML-% IV SOSY
PREFILLED_SYRINGE | INTRAVENOUS | Status: DC | PRN
  Administered 2016-04-07 (×3): 10 mg via INTRAVENOUS

## 2016-04-07 MED ORDER — EPHEDRINE 5 MG/ML INJ
INTRAVENOUS | Status: AC
  Filled 2016-04-07: qty 10

## 2016-04-07 MED ORDER — WHITE PETROLATUM GEL
Status: AC
  Filled 2016-04-07: qty 5

## 2016-04-07 MED ORDER — CIPROFLOXACIN IN D5W 400 MG/200ML IV SOLN
400.0000 mg | INTRAVENOUS | Status: AC
  Administered 2016-04-07: 400 mg via INTRAVENOUS
  Filled 2016-04-07: qty 200

## 2016-04-07 MED ORDER — CIPROFLOXACIN IN D5W 400 MG/200ML IV SOLN
INTRAVENOUS | Status: AC
  Filled 2016-04-07: qty 200

## 2016-04-07 MED ORDER — ONDANSETRON HCL 4 MG/2ML IJ SOLN
INTRAMUSCULAR | Status: DC | PRN
  Administered 2016-04-07: 4 mg via INTRAVENOUS

## 2016-04-07 MED ORDER — PROPOFOL 10 MG/ML IV BOLUS
INTRAVENOUS | Status: DC | PRN
  Administered 2016-04-07: 200 mg via INTRAVENOUS

## 2016-04-07 MED ORDER — PROPOFOL 10 MG/ML IV BOLUS
INTRAVENOUS | Status: AC
  Filled 2016-04-07: qty 20

## 2016-04-07 MED ORDER — ALFUZOSIN HCL ER 10 MG PO TB24: 10.0000 mg | ORAL_TABLET | Freq: Every day | ORAL | 0 refills | Status: DC

## 2016-04-07 MED ORDER — TRAMADOL HCL 50 MG PO TABS: 50.0000 mg | ORAL_TABLET | Freq: Four times a day (QID) | ORAL | 0 refills | Status: DC | PRN

## 2016-04-07 MED ORDER — PROMETHAZINE HCL 25 MG/ML IJ SOLN
6.2500 mg | INTRAMUSCULAR | Status: DC | PRN
  Filled 2016-04-07: qty 1

## 2016-04-07 MED ORDER — LIDOCAINE 2% (20 MG/ML) 5 ML SYRINGE
INTRAMUSCULAR | Status: AC
  Filled 2016-04-07: qty 5

## 2016-04-07 MED ORDER — DEXAMETHASONE SODIUM PHOSPHATE 4 MG/ML IJ SOLN
INTRAMUSCULAR | Status: DC | PRN
  Administered 2016-04-07: 10 mg via INTRAVENOUS

## 2016-04-07 SURGICAL SUPPLY — 28 items
BAG URINE DRAINAGE (UROLOGICAL SUPPLIES) ×3 IMPLANT
BLADE CLIPPER SURG (BLADE) ×6 IMPLANT
CATH FOLEY 2WAY SLVR  5CC 16FR (CATHETERS) ×2
CATH FOLEY 2WAY SLVR 5CC 16FR (CATHETERS) ×1 IMPLANT
CATH ROBINSON RED A/P 20FR (CATHETERS) ×3 IMPLANT
CLOTH BEACON ORANGE TIMEOUT ST (SAFETY) ×3 IMPLANT
COVER BACK TABLE 60X90IN (DRAPES) ×3 IMPLANT
COVER MAYO STAND STRL (DRAPES) ×3 IMPLANT
DRSG TEGADERM 4X4.75 (GAUZE/BANDAGES/DRESSINGS) ×6 IMPLANT
DRSG TEGADERM 8X12 (GAUZE/BANDAGES/DRESSINGS) ×6 IMPLANT
GLOVE BIO SURGEON STRL SZ7.5 (GLOVE) ×6 IMPLANT
GLOVE ECLIPSE 8.0 STRL XLNG CF (GLOVE) IMPLANT
GOWN STRL REUS W/ TWL LRG LVL3 (GOWN DISPOSABLE) ×1 IMPLANT
GOWN STRL REUS W/ TWL XL LVL3 (GOWN DISPOSABLE) ×1 IMPLANT
GOWN STRL REUS W/TWL LRG LVL3 (GOWN DISPOSABLE) ×3
GOWN STRL REUS W/TWL XL LVL3 (GOWN DISPOSABLE) ×3
HOLDER FOLEY CATH W/STRAP (MISCELLANEOUS) ×3 IMPLANT
IV NS 1000ML (IV SOLUTION) ×3
IV NS 1000ML BAXH (IV SOLUTION) ×1 IMPLANT
KIT RM TURNOVER CYSTO AR (KITS) ×3 IMPLANT
MANIFOLD NEPTUNE II (INSTRUMENTS) IMPLANT
Nucletron SelectSeed I-125 ×3 IMPLANT
PACK CYSTO (CUSTOM PROCEDURE TRAY) ×3 IMPLANT
SYRINGE 10CC LL (SYRINGE) ×3 IMPLANT
TUBE CONNECTING 12'X1/4 (SUCTIONS)
TUBE CONNECTING 12X1/4 (SUCTIONS) IMPLANT
UNDERPAD 30X30 INCONTINENT (UNDERPADS AND DIAPERS) ×6 IMPLANT
WATER STERILE IRR 500ML POUR (IV SOLUTION) ×3 IMPLANT

## 2016-04-07 NOTE — Anesthesia Procedure Notes (Signed)
Procedure Name: LMA Insertion Date/Time: 04/07/2016 11:51 AM Performed by: Denna Haggard D Pre-anesthesia Checklist: Patient identified, Emergency Drugs available, Suction available and Patient being monitored Patient Re-evaluated:Patient Re-evaluated prior to inductionOxygen Delivery Method: Circle system utilized Preoxygenation: Pre-oxygenation with 100% oxygen Intubation Type: IV induction Ventilation: Mask ventilation without difficulty LMA: LMA inserted LMA Size: 4.0 Number of attempts: 1 Airway Equipment and Method: Bite block Placement Confirmation: positive ETCO2 Tube secured with: Tape Dental Injury: Teeth and Oropharynx as per pre-operative assessment

## 2016-04-07 NOTE — Anesthesia Preprocedure Evaluation (Signed)
Anesthesia Evaluation  Patient identified by MRN, date of birth, ID band Patient awake    Reviewed: Allergy & Precautions, NPO status , Patient's Chart, lab work & pertinent test results  Airway Mallampati: II  TM Distance: >3 FB Neck ROM: Full    Dental no notable dental hx.    Pulmonary COPD, former smoker,    Pulmonary exam normal breath sounds clear to auscultation       Cardiovascular hypertension, + Peripheral Vascular Disease  Normal cardiovascular exam Rhythm:Regular Rate:Normal  EF > 60%   Neuro/Psych negative neurological ROS  negative psych ROS   GI/Hepatic negative GI ROS, Neg liver ROS,   Endo/Other  negative endocrine ROS  Renal/GU negative Renal ROS  negative genitourinary   Musculoskeletal negative musculoskeletal ROS (+)   Abdominal   Peds negative pediatric ROS (+)  Hematology negative hematology ROS (+)   Anesthesia Other Findings   Reproductive/Obstetrics negative OB ROS                             Anesthesia Physical Anesthesia Plan  ASA: III  Anesthesia Plan: General   Post-op Pain Management:    Induction: Intravenous  Airway Management Planned: LMA  Additional Equipment:   Intra-op Plan:   Post-operative Plan: Extubation in OR  Informed Consent: I have reviewed the patients History and Physical, chart, labs and discussed the procedure including the risks, benefits and alternatives for the proposed anesthesia with the patient or authorized representative who has indicated his/her understanding and acceptance.   Dental advisory given  Plan Discussed with: CRNA and Surgeon  Anesthesia Plan Comments:         Anesthesia Quick Evaluation

## 2016-04-07 NOTE — Transfer of Care (Signed)
Immediate Anesthesia Transfer of Care Note  Patient: David Cantrell  Procedure(s) Performed: Procedure(s) with comments: RADIOACTIVE SEED IMPLANT/BRACHYTHERAPY IMPLANT (N/A) - 52 seeds implanted CYSTOSCOPY FLEXIBLE (N/A) - no seeds found in bladder  Patient Location: PACU  Anesthesia Type:General  Level of Consciousness: sedated, patient cooperative and responds to stimulation  Airway & Oxygen Therapy: Patient Spontanous Breathing and Patient connected to face mask oxygen  Post-op Assessment: Report given to RN and Post -op Vital signs reviewed and stable  Post vital signs: Reviewed and stable  Last Vitals:  Vitals:   04/07/16 0904 04/07/16 1328  BP: 129/62 110/88  Pulse: (!) 58 61  Resp: 16 11  Temp: 36.4 C     Last Pain:  Vitals:   04/07/16 0904  TempSrc: Oral      Patients Stated Pain Goal: 6 (66/59/93 5701)  Complications: No apparent anesthesia complications

## 2016-04-07 NOTE — Interval H&P Note (Signed)
History and Physical Interval Note:  04/07/2016 11:13 AM  David Cantrell  has presented today for surgery, with the diagnosis of PROSTATE CANCER  The various methods of treatment have been discussed with the patient and family. After consideration of risks, benefits and other options for treatment, the patient has consented to  Procedure(s): RADIOACTIVE SEED IMPLANT/BRACHYTHERAPY IMPLANT (N/A) as a surgical intervention .  The patient's history has been reviewed, patient examined, no change in status, stable for surgery.  I have reviewed the patient's chart and labs. He's doing well. No pelvic pain. C/o fatigue, BRBPR (small amount with BM) and a weak stream. No dysuria. Otherwise looks and feels well. Questions were answered to the patient's satisfaction.  He elects to proceed.    Caroline Longie

## 2016-04-07 NOTE — Discharge Instructions (Signed)
Brachytherapy for Prostate Cancer, Care After Refer to this sheet in the next few weeks. These instructions provide you with information on caring for yourself after your procedure. Your health care provider may also give you more specific instructions. Your treatment has been planned according to current medical practices, but problems sometimes occur. Call your health care provider if you have any problems or questions after your procedure. What can I expect after the procedure? The area behind the scrotum will probably be tender and bruised. For a short period of time you may have:  Difficulty passing urine. You may need a catheter for a few days to a month.  Blood in the urine or semen.  A feeling of constipation because of prostate swelling.  Frequent feeling of an urgent need to urinate. For a long period of time you may have:  Inflammation of the rectum. This happens in about 2% of people who have the procedure.  Erection problems. These vary with age and occur in about 15-40% of men.  Difficulty urinating. This is caused by scarring in the urethra.  Diarrhea. Follow these instructions at home:  Take medicines only as directed by your health care provider.  You will probably have a catheter in your bladder for several days. You will have blood in the urine bag and should drink a lot of fluids to keep it a light red color.  Keep all follow-up visits as directed by your health care provider. If you have a catheter, it will be removed during one of these visits.  Try not to sit directly on the area behind the scrotum. A soft cushion can decrease the discomfort. Ice packs may also be helpful for the discomfort. Do not put ice directly on the skin.  Shower and wash the area behind the scrotum gently. Do not sit in a tub.  If you have had the brachytherapy that uses the seeds, limit your close contact with children and pregnant women for 2 months because of the radiation still in  the prostate. After that period of time, the levels drop off quickly. Get help right away if:  You have a fever.  You have chills.  You have shortness of breath.  You have chest pain.  You have thick blood, like tomato juice, in the urine bag.  Your catheter is blocked so urine cannot get into the bag. Your bladder area or lower abdomen may be swollen.  There is excessive bleeding from your rectum. It is normal to have a little blood mixed with your stool.  There is severe discomfort in the treated area that does not go away with pain medicine.  You have abdominal discomfort.  You have severe nausea or vomiting.  You develop any new or unusual symptoms. This information is not intended to replace advice given to you by your health care provider. Make sure you discuss any questions you have with your health care provider. Document Released: 01/31/2010 Document Revised: 06/12/2015 Document Reviewed: 06/21/2012 Elsevier Interactive Patient Education  2017 [place].   Cystoscopy, Care After Refer to this sheet in the next few weeks. These instructions provide you with information about caring for yourself after your procedure. Your health care provider may also give you more specific instructions. Your treatment has been planned according to current medical practices, but problems sometimes occur. Call your health care provider if you have any problems or questions after your procedure. What can I expect after the procedure? After the procedure, it is common  to have:  Mild pain when you urinate. Pain should stop within a few minutes after you urinate. This may last for up to 1 week.  A small amount of blood in your urine for several days.  Feeling like you need to urinate but producing only a small amount of urine. Follow these instructions at home:   Medicines   Take over-the-counter and prescription medicines only as told by your health care provider.  If you were  prescribed an antibiotic medicine, take it as told by your health care provider. Do not stop taking the antibiotic even if you start to feel better. General instructions    Return to your normal activities as told by your health care provider. Ask your health care provider what activities are safe for you.  Do not drive for 24 hours if you received a sedative.  Watch for any blood in your urine. If the amount of blood in your urine increases, call your health care provider.  Follow instructions from your health care provider about eating or drinking restrictions.  If a tissue sample was removed for testing (biopsy) during your procedure, it is your responsibility to get your test results. Ask your health care provider or the department performing the test when your results will be ready.  Drink enough fluid to keep your urine clear or pale yellow.  Keep all follow-up visits as told by your health care provider. This is important. Contact a health care provider if:  You have pain that gets worse or does not get better with medicine, especially pain when you urinate.  You have difficulty urinating. Get help right away if:  You have more blood in your urine.  You have blood clots in your urine.  You have abdominal pain.  You have a fever or chills.  You are unable to urinate. This information is not intended to replace advice given to you by your health care provider. Make sure you discuss any questions you have with your health care provider. Document Released: 07/18/2004 Document Revised: 06/06/2015 Document Reviewed: 11/15/2014 Elsevier Interactive Patient Education  2017 [place] [facility] Instructions  Activity: Get plenty of rest for the remainder of the day. A responsible individual must stay with you for 24 hours following the procedure.  For the next 24 hours, DO NOT: -Drive a car -Paediatric nurse -Drink alcoholic beverages -Take any  medication unless instructed by your physician -Make any legal decisions or sign important papers.  Meals: Start with liquid foods such as gelatin or soup. Progress to regular foods as tolerated. Avoid greasy, spicy, heavy foods. If nausea and/or vomiting occur, drink only clear liquids until the nausea and/or vomiting subsides. Call your physician if vomiting continues.  Special Instructions/Symptoms: Your throat may feel dry or sore from the anesthesia or the breathing tube placed in your throat during surgery. If this causes discomfort, gargle with warm salt water. The discomfort should disappear within 24 hours.  If you had a scopolamine patch placed behind your ear for the management of post- operative nausea and/or vomiting:  1. The medication in the patch is effective for 72 hours, after which it should be removed.  Wrap patch in a tissue and discard in the trash. Wash hands thoroughly with soap and water. 2. You may remove the patch earlier than 72 hours if you experience unpleasant side effects which may include dry mouth, dizziness or visual disturbances. 3. Avoid touching the patch. Wash your hands with soap  and water after contact with the patch.

## 2016-04-07 NOTE — Anesthesia Preprocedure Evaluation (Deleted)
Anesthesia Evaluation  Patient identified by MRN, date of birth, ID band Patient awake    Reviewed: Allergy & Precautions, NPO status , Patient's Chart, lab work & pertinent test results  Airway Mallampati: II  TM Distance: >3 FB Neck ROM: Full    Dental no notable dental hx. (+) Upper Dentures, Partial Lower   Pulmonary COPD, former smoker,    Pulmonary exam normal breath sounds clear to auscultation       Cardiovascular hypertension, Pt. on medications + Peripheral Vascular Disease  Normal cardiovascular exam Rhythm:Regular Rate:Normal     Neuro/Psych negative neurological ROS  negative psych ROS   GI/Hepatic negative GI ROS, Neg liver ROS,   Endo/Other  negative endocrine ROS  Renal/GU negative Renal ROS  negative genitourinary   Musculoskeletal negative musculoskeletal ROS (+)   Abdominal   Peds negative pediatric ROS (+)  Hematology negative hematology ROS (+)   Anesthesia Other Findings   Reproductive/Obstetrics negative OB ROS                             Anesthesia Physical Anesthesia Plan  ASA: III  Anesthesia Plan: General   Post-op Pain Management:    Induction: Intravenous  Airway Management Planned: LMA  Additional Equipment:   Intra-op Plan:   Post-operative Plan: Extubation in OR  Informed Consent: I have reviewed the patients History and Physical, chart, labs and discussed the procedure including the risks, benefits and alternatives for the proposed anesthesia with the patient or authorized representative who has indicated his/her understanding and acceptance.   Dental advisory given  Plan Discussed with: CRNA and Surgeon  Anesthesia Plan Comments:         Anesthesia Quick Evaluation

## 2016-04-07 NOTE — Op Note (Signed)
Preoperative diagnosis: Prostate cancer Postoperative diagnosis: Prostate cancer  Procedure:  I-125 prostate brachytherapy seed implant, cystoscopy  Surgeon: Junious Silk  Anesthesia: Gen.  Indication for procedure: 76 year old with intermediate risk prostate cancer who completed 25 fractions of IMRT and presents for brachytherapy.   Findings: Seed implantation appeared appropriate by fluoroscopy, cystoscopy was normal.  Discussion of procedure: After consent was obtained patient brought to the operating room. After adequate anesthesia he was placed in lithotomy position and prepped and draped in the usual sterile fashion. A timeout was performed to confirm the patient and procedure. The transrectal ultrasound probe and grid was placed. Dr. Tammi Klippel begin placing the needles based on his planning. I then entered the room and a second timeout was performed. I placed the remainder of the needles. A total of 17 catheters were utilized with 55 radioactive sources. Prostate was about 27 g. The grid and transrectal ultrasound probe was removed. The patient had some visible external hemorrhoids but no active bleeding. On digital rectal exam I did not palpate any mass or other abnormality. The cystoscope was passed per urethra and the bladder inspected. There were no seeds noted in the urethra or the bladder. The trigone and ureteral orifices appear normal. The bladder mucosa appeared normal. There were no foreign bodies in the bladder. No stones. No trabeculation. Prostate did not appear particularly obstructing. The scope was removed and he was awakened and taken to recovery room in stable condition.  Complications: None  Blood loss: Minimal  Specimens: None  Drains: None  Disposition: Patient stable to PACU

## 2016-04-08 ENCOUNTER — Encounter (HOSPITAL_BASED_OUTPATIENT_CLINIC_OR_DEPARTMENT_OTHER): Payer: Self-pay | Admitting: Urology

## 2016-04-08 ENCOUNTER — Other Ambulatory Visit: Payer: PPO

## 2016-04-08 ENCOUNTER — Ambulatory Visit: Payer: PPO | Admitting: Surgery

## 2016-04-08 NOTE — Anesthesia Postprocedure Evaluation (Signed)
Anesthesia Post Note  Patient: Raman Featherston Mera  Procedure(s) Performed: Procedure(s) (LRB): RADIOACTIVE SEED IMPLANT/BRACHYTHERAPY IMPLANT (N/A) CYSTOSCOPY FLEXIBLE (N/A)  Patient location during evaluation: PACU Anesthesia Type: General Level of consciousness: awake and alert Pain management: pain level controlled Vital Signs Assessment: post-procedure vital signs reviewed and stable Respiratory status: spontaneous breathing, nonlabored ventilation, respiratory function stable and patient connected to nasal cannula oxygen Cardiovascular status: blood pressure returned to baseline and stable Postop Assessment: no signs of nausea or vomiting Anesthetic complications: no       Last Vitals:  Vitals:   04/07/16 1428 04/07/16 1520  BP:  140/75  Pulse: (!) 59 60  Resp: 15 16  Temp:  36.4 C    Last Pain:  Vitals:   04/07/16 1530  TempSrc:   PainSc: 2                  Machele Deihl S

## 2016-04-12 ENCOUNTER — Encounter: Payer: Self-pay | Admitting: Internal Medicine

## 2016-04-12 DIAGNOSIS — I712 Thoracic aortic aneurysm, without rupture, unspecified: Secondary | ICD-10-CM | POA: Insufficient documentation

## 2016-04-29 ENCOUNTER — Telehealth: Payer: Self-pay | Admitting: *Deleted

## 2016-04-29 DIAGNOSIS — C61 Malignant neoplasm of prostate: Secondary | ICD-10-CM | POA: Diagnosis not present

## 2016-04-29 NOTE — Telephone Encounter (Signed)
CALLED PATIENT TO REMIND OF POST SEED APPTS. FOR 04-30-16, SPOKE WITH PATIENT AND HE IS AWARE OF THESE APPTS.

## 2016-04-30 ENCOUNTER — Ambulatory Visit: Admit: 2016-04-30 | Payer: PPO | Admitting: Radiation Oncology

## 2016-04-30 ENCOUNTER — Encounter: Payer: Self-pay | Admitting: Medical Oncology

## 2016-04-30 ENCOUNTER — Ambulatory Visit
Admission: RE | Admit: 2016-04-30 | Discharge: 2016-04-30 | Disposition: A | Discharge status: Home / Self Care | Payer: PPO | Source: Ambulatory Visit | Attending: Radiation Oncology | Admitting: Radiation Oncology

## 2016-04-30 VITALS — BP 130/88 | HR 65 | Resp 16 | Wt 237.2 lb

## 2016-04-30 DIAGNOSIS — C61 Malignant neoplasm of prostate: Secondary | ICD-10-CM

## 2016-04-30 DIAGNOSIS — R350 Frequency of micturition: Secondary | ICD-10-CM | POA: Diagnosis not present

## 2016-04-30 DIAGNOSIS — Z510 Encounter for antineoplastic radiation therapy: Secondary | ICD-10-CM | POA: Diagnosis not present

## 2016-04-30 MED ORDER — TAMSULOSIN HCL 0.4 MG PO CAPS: 0.4000 mg | ORAL_CAPSULE | Freq: Every day | ORAL | 2 refills | Status: DC

## 2016-04-30 NOTE — Progress Notes (Signed)
Weight and vitals stable. Denies pain. Pre seed IPSS 11. Post seed IPSS 19. Intermittency and weak stream are his biggest complaints. Reports occasional dysuria. Denies hematuria or leakage. Seen by Fritz Pickerel, NP at [facility] yesterday. Scheduled to follow up with [facility] again in two months. Requesting Flomax refill.   BP 130/88 (BP Location: Left Arm, Patient Position: Sitting, Cuff Size: Normal)   Pulse 65   Resp 16   Wt 237 lb 3.2 oz (107.6 kg)   SpO2 100%   BMI 31.29 kg/m  Wt Readings from Last 3 Encounters:  04/30/16 237 lb 3.2 oz (107.6 kg)  04/07/16 225 lb (102.1 kg)  04/06/16 231 lb (104.8 kg)
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
## 2016-04-30 NOTE — Progress Notes (Signed)
  Radiation Oncology         [phone] ________________________________  Name: David Cantrell MRN: [id number]  Date: 04/30/2016  DOB: [birth year]  COMPLEX SIMULATION NOTE  NARRATIVE:  The patient was brought to the [facility] today following prostate seed implantation approximately one month ago.  Identity was confirmed.  All relevant records and images related to the planned course of therapy were reviewed.  Then, the patient was set-up supine.  CT images were obtained.  The CT images were loaded into the planning software.  Then the prostate and rectum were contoured.  Treatment planning then occurred.  The implanted iodine 125 seeds were identified by the physics staff for projection of radiation distribution  I have requested : 3D Simulation  I have requested a DVH of the following structures: Prostate and rectum.    ________________________________  David Cantrell, M.D.  This document serves as a record of services personally performed by Tyler Pita, MD. It was created on his behalf by Bethann Humble, a trained medical scribe. The creation of this record is based on the scribe's personal observations and the provider's statements to them. This document has been checked and approved by the attending provider.

## 2016-05-06 ENCOUNTER — Ambulatory Visit
Admission: RE | Admit: 2016-05-06 | Discharge: 2016-05-06 | Disposition: A | Discharge status: Home / Self Care | Payer: PPO | Source: Ambulatory Visit | Attending: Surgery | Admitting: Surgery

## 2016-05-06 ENCOUNTER — Ambulatory Visit (INDEPENDENT_AMBULATORY_CARE_PROVIDER_SITE_OTHER): Payer: PPO | Admitting: Surgery

## 2016-05-06 ENCOUNTER — Encounter: Payer: Self-pay | Admitting: Surgery

## 2016-05-06 VITALS — BP 95/60 | HR 74 | Resp 20 | Ht 73.0 in | Wt 237.0 lb

## 2016-05-06 DIAGNOSIS — I712 Thoracic aortic aneurysm, without rupture, unspecified: Secondary | ICD-10-CM

## 2016-05-06 DIAGNOSIS — R918 Other nonspecific abnormal finding of lung field: Secondary | ICD-10-CM

## 2016-05-07 ENCOUNTER — Encounter: Payer: Self-pay | Admitting: Surgery

## 2016-05-07 NOTE — Progress Notes (Signed)
HPI:  The patient is a 76 year old gentleman with a history of hypertension, hyperlipidemia, and coronary disease who was evaluated by Dr. Lake Bells for a few month history of dyspnea on exertion and wheezing and a CT scan of the chest showing multiple pulmonary nodules, one of which was 1.1 cm which was felt to possibly be due to MAI.  His CT scan also showed fusiform enlargement of the ascending aorta to 4.4 cm with the sinuses measuring 4.3 cm and the descending aorta 3.0 cm. This was unchanged from his prior CT of the chest done on 07/03/2013. He had a CT of the chest in 2003 and 2004 for chest pain that ruled out PE. There was no mention of the aorta and I could not access the films from that long ago. He denies any chest or back pain. I last saw him in 09/2015 and CT scan at that time showed the ascending aorta to measure 4.2 cm which was stable. He has been diagnosed with prostate cancer recently and is seeing Dr. Junious Silk for that. He has undergone IMRT and then radioactive seed implant in 03/2016.  Current Outpatient Prescriptions  Medication Sig Dispense Refill  . acetaminophen (TYLENOL) 500 MG tablet Take 1,000 mg by mouth every 6 (six) hours as needed.    Marland Kitchen albuterol (PROVENTIL HFA;VENTOLIN HFA) 108 (90 BASE) MCG/ACT inhaler Inhale 2 puffs into the lungs every 6 (six) hours as needed for wheezing or shortness of breath. 1 Inhaler 2  . alfuzosin (UROXATRAL) 10 MG 24 hr tablet Take 1 tablet (10 mg total) by mouth daily before supper. 30 tablet 0  . aspirin EC 81 MG tablet Take 81 mg by mouth daily.     . brimonidine (ALPHAGAN) 0.15 % ophthalmic solution Place 1 drop into the right eye 2 (two) times daily.     . Coenzyme Q10 (COQ10) 100 MG CAPS Take 1 capsule by mouth daily.    Marland Kitchen dextromethorphan-guaiFENesin (MUCINEX DM) 30-600 MG 12hr tablet Take 1 tablet by mouth every morning.     . dorzolamide-timolol (COSOPT) 22.3-6.8 MG/ML ophthalmic solution Place 1 drop into the right eye 2 (two)  times daily.    . fenofibrate 160 MG tablet Take 160 mg by mouth every morning.     . Flaxseed, Linseed, 1300 MG CAPS Take by mouth 2 (two) times daily with a meal.    . Fluticasone-Salmeterol (ADVAIR DISKUS) 250-50 MCG/DOSE AEPB Inhale 1 puff into the lungs 2 (two) times daily. (Patient taking differently: Inhale 1 puff into the lungs 2 (two) times daily as needed. ) 14 each 0  . GARLIC PO Take 1 capsule by mouth daily.    . hydrocortisone ointment 0.5 % Apply 1 application topically 2 (two) times daily.    . metoprolol tartrate (LOPRESSOR) 25 MG tablet Take 1 tablet (25 mg total) by mouth 2 (two) times daily. 180 tablet 3  . Multiple Vitamins-Minerals (MULTIVITAMIN ADULTS PO) Take 1 tablet by mouth daily.     Marland Kitchen NITROSTAT 0.4 MG SL tablet Place 0.4 mg under the tongue every 5 (five) minutes as needed.     . Omega-3 Fatty Acids (FISH OIL) 1200 MG CAPS Take 1 capsule by mouth 2 (two) times daily. 1 by mouth daily    . psyllium (METAMUCIL) 58.6 % packet Take 1 packet by mouth daily.    . rosuvastatin (CRESTOR) 10 MG tablet Take 10 mg by mouth 2 (two) times a week. Monday and thursday    .  shark liver oil-cocoa butter (PREPARATION H) 0.25-88.44 % suppository Place 1 suppository rectally as needed for hemorrhoids.    . tamsulosin (FLOMAX) 0.4 MG CAPS capsule Take 1 capsule (0.4 mg total) by mouth daily. 30 capsule 2  . traMADol (ULTRAM) 50 MG tablet Take 1 tablet (50 mg total) by mouth every 6 (six) hours as needed. 30 tablet 0   No current facility-administered medications for this visit.      Physical Exam: BP 95/60   Pulse 74   Resp 20   Ht 6\' 1"  (1.854 m)   Wt 237 lb (107.5 kg)   SpO2 97% Comment: RA  BMI 31.27 kg/m  He looks well Cardiac exam shows a regular rate and rhythm with normal heart sounds Lung exam is clear   Diagnostic Tests:  CLINICAL DATA:  Pulmonary nodules.  History of prostate carcinoma  EXAM: CT CHEST WITHOUT CONTRAST  TECHNIQUE: Multidetector CT imaging  of the chest was performed following the standard protocol without IV contrast.  COMPARISON:  October 09, 2015  FINDINGS: Cardiovascular: There remains prominence in the ascending thoracic aorta with a measured transverse diameter of 4.2 cm, measured on axial slice 64 series 3. Visualized great vessels appear normal. Scattered foci of atherosclerotic calcification in the aorta remain stable. There are multiple foci of coronary artery calcification. Pericardium is not appreciably thickened.  Mediastinum/Nodes: Thyroid appears unremarkable. There is no appreciable thoracic adenopathy. Scattered subcentimeter lymph nodes in the mediastinum appears stable.  Lungs/Pleura: There is underlying centrilobular emphysematous change. On axial slice 79 series 4, there is a stable 5 mm nodular opacity in the superior segment of the right lower lobe. On axial slice 87 series 4, there is a stable nodular opacity in the superior segment of the left lower lobe measuring 6 mm. On this same slice in the superior segment right lower lobe, there is a 4 mm nodular opacity. There is an adjacent 3 mm nodular opacity on axial slice 88 series 4 in the superior segment right lower lobe. There is a 7 mm nodular opacity in the superior segment right lower lobe seen on axial slice 90 series 4 which appears marginally smaller compared to previous study. There is a new 2 mm nodular opacity in the lateral segment right middle lobe on axial slice 90 series 4. On axial slice 98 series 4, there is a 5 mm there is a stable 4 mm nodular opacity abutting the pleura in the posterior segment right lower lobe on axial slice 242 series 4. There is a probable granuloma in the lateral segment right lower lobe measuring 4 mm on axial slice 353 series 4. The small nodular lesion is calcified. There is no parenchymal lung edema or consolidation. No pleural effusion or pleural thickening.  Upper Abdomen: There is  atherosclerotic calcification in the aorta. There are cysts arising from the upper pole of the right kidney, largest measuring 4.5 x 4.5 cm. There is cholelithiasis.  Musculoskeletal: There are no blastic or lytic bone lesions. There is degenerative change in the thoracic spine. There is postoperative change in the region of the right glenoid.  IMPRESSION: Multiple nodular opacities throughout the lungs which for the most part appears stable or actually marginally decreased in size. A single new 2 mm nodular opacity is noted in the right middle lobe.  There is a degree of underlying emphysematous change. No edema or consolidation.  No evident adenopathy.  Ascending thoracic aortic prominence remains without change. Recommend annual imaging followup by CTA or  MRA. This recommendation follows 2010 ACCF/AHA/AATS/ACR/ASA/SCA/SCAI/SIR/STS/SVM Guidelines for the Diagnosis and Management of Patients with Thoracic Aortic Disease. Circulation. 2010; 121: Z601-U932.  Areas of atherosclerotic change including foci of calcification of coronary arteries noted.   Electronically Signed   By: Lowella Grip III M.D.   On: 05/06/2016 10:41   Impression:  The multiple nodular opacities throughout the lungs are stable with a new 2 mm nodule in the RML. 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 This will not require treatment unless it progressively enlarges to 5.5 cm, which is unlikely in his lifetime. I reveiwed the CT scan with the patient and his wife and answered their questions. The current recommendation is to follow this yearly.  Plan:  He will return to see me in one year with a CTA of the chest.   Gaye Pollack, MD [facility] [phone]

## 2016-05-12 NOTE — Addendum Note (Signed)
Encounter addended by: Heywood Footman, RN on: 05/12/2016 11:57 AM<BR>    Actions taken: Charge Capture section accepted

## 2016-05-13 ENCOUNTER — Encounter: Payer: Self-pay | Admitting: Radiation Oncology

## 2016-05-13 DIAGNOSIS — Z510 Encounter for antineoplastic radiation therapy: Secondary | ICD-10-CM | POA: Diagnosis not present

## 2016-05-13 DIAGNOSIS — C61 Malignant neoplasm of prostate: Secondary | ICD-10-CM | POA: Diagnosis not present

## 2016-05-13 NOTE — Progress Notes (Signed)
  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 He underwent CT scan complex simulation to delineate the three-dimensional structures of the pelvis and demonstrate the radiation distribution.  Since that time, the seed localization, and complex isodose planning with dose volume histograms have now been completed.  Results:   Prostate Coverage - The dose of radiation delivered to the 90% or more of the prostate gland (D90) was 102.46% of the prescription dose. This exceeds our goal of greater than 90%. Rectal Sparing - The volume of rectal tissue receiving the prescription dose or higher was 0.16 cc. This falls under our thresholds tolerance of 1.0 cc.  Impression: The prostate seed implant appears to show adequate target coverage and appropriate rectal sparing.  Plan:  The patient will continue to follow with urology for ongoing PSA determinations. I would anticipate a high likelihood for local tumor control with minimal risk for rectal morbidity.  ________________________________  Sheral Apley Tammi Klippel, M.D.

## 2016-06-15 NOTE — Anesthesia Postprocedure Evaluation (Signed)
Anesthesia Post Note  Patient: David Cantrell  Procedure(s) Performed: Procedure(s) (LRB): RADIOACTIVE SEED IMPLANT/BRACHYTHERAPY IMPLANT (N/A) CYSTOSCOPY FLEXIBLE (N/A)     Anesthesia Post Evaluation  Last Vitals:  Vitals:   04/07/16 1428 04/07/16 1520  BP:  140/75  Pulse: (!) 59 60  Resp: 15 16  Temp:  36.4 C    Last Pain:  Vitals:   04/08/16 1326  TempSrc:   PainSc: 4                  Katura Eatherly S

## 2016-06-15 NOTE — Addendum Note (Signed)
Addendum  created 06/15/16 1244 by Myrtie Soman, MD   Sign clinical note

## 2016-06-22 DIAGNOSIS — H401131 Primary open-angle glaucoma, bilateral, mild stage: Secondary | ICD-10-CM | POA: Diagnosis not present

## 2016-07-06 ENCOUNTER — Other Ambulatory Visit: Payer: Self-pay | Admitting: Internal Medicine

## 2016-07-20 ENCOUNTER — Other Ambulatory Visit: Payer: Self-pay

## 2016-07-20 ENCOUNTER — Other Ambulatory Visit: Payer: Self-pay | Admitting: Dermatology

## 2016-07-20 DIAGNOSIS — Z85828 Personal history of other malignant neoplasm of skin: Secondary | ICD-10-CM | POA: Diagnosis not present

## 2016-07-20 DIAGNOSIS — L308 Other specified dermatitis: Secondary | ICD-10-CM | POA: Diagnosis not present

## 2016-07-20 DIAGNOSIS — C44311 Basal cell carcinoma of skin of nose: Secondary | ICD-10-CM | POA: Diagnosis not present

## 2016-07-20 DIAGNOSIS — D225 Melanocytic nevi of trunk: Secondary | ICD-10-CM | POA: Diagnosis not present

## 2016-07-20 DIAGNOSIS — D485 Neoplasm of uncertain behavior of skin: Secondary | ICD-10-CM | POA: Diagnosis not present

## 2016-07-20 DIAGNOSIS — L821 Other seborrheic keratosis: Secondary | ICD-10-CM | POA: Diagnosis not present

## 2016-07-20 DIAGNOSIS — D2339 Other benign neoplasm of skin of other parts of face: Secondary | ICD-10-CM | POA: Diagnosis not present

## 2016-07-20 DIAGNOSIS — D1801 Hemangioma of skin and subcutaneous tissue: Secondary | ICD-10-CM | POA: Diagnosis not present

## 2016-07-20 DIAGNOSIS — L814 Other melanin hyperpigmentation: Secondary | ICD-10-CM | POA: Diagnosis not present

## 2016-07-20 NOTE — Patient Outreach (Signed)
    Unsuccessful attempt made to contact patient via telephone for this HTA Screening call. Will make another attempt in the next 21 days.

## 2016-07-20 NOTE — Patient Outreach (Signed)
    Unsuccessful attempt made to contact patient for this HTA Screening Call. Will make another attempt in the next 14 days.

## 2016-07-20 NOTE — Patient Outreach (Signed)
    Unsuccessful attempt made to contact patient via telephone . HIPPA compliant message left at [phone]  Will send out unable to contact letter with information on [facility] Programs for patient to use if he has case management needs.

## 2016-07-27 DIAGNOSIS — I10 Essential (primary) hypertension: Secondary | ICD-10-CM | POA: Diagnosis not present

## 2016-07-27 DIAGNOSIS — E785 Hyperlipidemia, unspecified: Secondary | ICD-10-CM | POA: Diagnosis not present

## 2016-07-27 DIAGNOSIS — I25118 Atherosclerotic heart disease of native coronary artery with other forms of angina pectoris: Secondary | ICD-10-CM | POA: Diagnosis not present

## 2016-07-27 DIAGNOSIS — I25119 Atherosclerotic heart disease of native coronary artery with unspecified angina pectoris: Secondary | ICD-10-CM | POA: Diagnosis not present

## 2016-07-27 DIAGNOSIS — E668 Other obesity: Secondary | ICD-10-CM | POA: Diagnosis not present

## 2016-07-27 DIAGNOSIS — R0602 Shortness of breath: Secondary | ICD-10-CM | POA: Diagnosis not present

## 2016-07-27 DIAGNOSIS — R0609 Other forms of dyspnea: Secondary | ICD-10-CM | POA: Diagnosis not present

## 2016-07-27 DIAGNOSIS — I119 Hypertensive heart disease without heart failure: Secondary | ICD-10-CM | POA: Diagnosis not present

## 2016-07-27 DIAGNOSIS — C61 Malignant neoplasm of prostate: Secondary | ICD-10-CM | POA: Diagnosis not present

## 2016-07-27 DIAGNOSIS — I208 Other forms of angina pectoris: Secondary | ICD-10-CM | POA: Diagnosis not present

## 2016-07-29 DIAGNOSIS — C61 Malignant neoplasm of prostate: Secondary | ICD-10-CM | POA: Diagnosis not present

## 2016-07-29 DIAGNOSIS — R3912 Poor urinary stream: Secondary | ICD-10-CM | POA: Diagnosis not present

## 2016-08-31 DIAGNOSIS — Z85828 Personal history of other malignant neoplasm of skin: Secondary | ICD-10-CM | POA: Diagnosis not present

## 2016-08-31 DIAGNOSIS — L905 Scar conditions and fibrosis of skin: Secondary | ICD-10-CM | POA: Diagnosis not present

## 2016-09-22 DIAGNOSIS — H401111 Primary open-angle glaucoma, right eye, mild stage: Secondary | ICD-10-CM | POA: Diagnosis not present

## 2016-10-13 ENCOUNTER — Ambulatory Visit: Payer: PPO

## 2016-10-13 ENCOUNTER — Ambulatory Visit (INDEPENDENT_AMBULATORY_CARE_PROVIDER_SITE_OTHER): Payer: PPO | Admitting: Family Medicine

## 2016-10-13 ENCOUNTER — Other Ambulatory Visit (INDEPENDENT_AMBULATORY_CARE_PROVIDER_SITE_OTHER): Payer: PPO

## 2016-10-13 ENCOUNTER — Encounter: Payer: Self-pay | Admitting: Family Medicine

## 2016-10-13 VITALS — BP 110/70 | HR 111 | Temp 98.0°F | Ht 73.0 in | Wt 237.0 lb

## 2016-10-13 DIAGNOSIS — Z87442 Personal history of urinary calculi: Secondary | ICD-10-CM | POA: Diagnosis not present

## 2016-10-13 DIAGNOSIS — R350 Frequency of micturition: Secondary | ICD-10-CM | POA: Insufficient documentation

## 2016-10-13 DIAGNOSIS — Z23 Encounter for immunization: Secondary | ICD-10-CM | POA: Diagnosis not present

## 2016-10-13 DIAGNOSIS — R300 Dysuria: Secondary | ICD-10-CM

## 2016-10-13 DIAGNOSIS — R319 Hematuria, unspecified: Secondary | ICD-10-CM

## 2016-10-13 LAB — CBC WITH DIFFERENTIAL/PLATELET
BASOS ABS: 0.1 10*3/uL (ref 0.0–0.1)
Basophils Relative: 1 % (ref 0.0–3.0)
EOS PCT: 2.5 % (ref 0.0–5.0)
Eosinophils Absolute: 0.2 10*3/uL (ref 0.0–0.7)
HCT: 41 % (ref 39.0–52.0)
HEMOGLOBIN: 13.9 g/dL (ref 13.0–17.0)
LYMPHS ABS: 2 10*3/uL (ref 0.7–4.0)
Lymphocytes Relative: 28.1 % (ref 12.0–46.0)
MCHC: 33.9 g/dL (ref 30.0–36.0)
MCV: 96.4 fl (ref 78.0–100.0)
MONO ABS: 0.9 10*3/uL (ref 0.1–1.0)
MONOS PCT: 12.6 % — AB (ref 3.0–12.0)
NEUTROS PCT: 55.8 % (ref 43.0–77.0)
Neutro Abs: 4 10*3/uL (ref 1.4–7.7)
Platelets: 262 10*3/uL (ref 150.0–400.0)
RBC: 4.26 Mil/uL (ref 4.22–5.81)
RDW: 12.9 % (ref 11.5–15.5)
WBC: 7.1 10*3/uL (ref 4.0–10.5)

## 2016-10-13 LAB — POC URINALSYSI DIPSTICK (AUTOMATED)
BILIRUBIN UA: NEGATIVE
Blood, UA: POSITIVE
Glucose, UA: NEGATIVE
KETONES UA: NEGATIVE
LEUKOCYTES UA: NEGATIVE
Nitrite, UA: NEGATIVE
PH UA: 6 (ref 5.0–8.0)
Protein, UA: POSITIVE
SPEC GRAV UA: 1.02 (ref 1.010–1.025)
Urobilinogen, UA: 0.2 E.U./dL

## 2016-10-13 LAB — URINALYSIS
Bilirubin Urine: NEGATIVE
Ketones, ur: NEGATIVE
LEUKOCYTES UA: NEGATIVE
NITRITE: NEGATIVE
PH: 5.5 (ref 5.0–8.0)
SPECIFIC GRAVITY, URINE: 1.02 (ref 1.000–1.030)
Total Protein, Urine: NEGATIVE
Urine Glucose: NEGATIVE
Urobilinogen, UA: 0.2 (ref 0.0–1.0)

## 2016-10-13 NOTE — Progress Notes (Signed)
David Cantrell - 76 y.o. male MRN [id number]  Date of birth: [birth year]  SUBJECTIVE:  Including CC & ROS.  Chief Complaint  Patient presents with  . Urinary Frequency    Patient states that since Friday has had urinary frequency and pain when urinating.     David Cantrell is a 76 year old male that is presenting with some testicular pain, increased urination, and pain that radiates to his back. The symptoms started on Friday and have been intermittent. There have been significant in nature at times. He denies any fevers or chills. He has been having to urinate every 1-2 hours. Last night was much worse than what he feels today. He is also been sweaty and had some suprapubic pain. Has tried a ice these different areas. He denies any blood in his urine. He does have a history of kidney stones.   Review of his op note from 04/07/16 shows implementation of the seeds in the prostate did not appear obstructing. Review of his CT abdomen pelvis from 01/04/13 shows 2 mm nonobstructing distal right ureteral calculus, and bilateral renal cysts with an additional 9 mm nonobstructing right renal calculus  Review of Systems  Constitutional: Negative for fever.  Gastrointestinal: Positive for abdominal pain.  Genitourinary: Positive for frequency and testicular pain. Negative for hematuria.  Musculoskeletal: Positive for back pain.    HISTORY: Past Medical, Surgical, Social, and Family History Reviewed & Updated per EMR.   Pertinent Historical Findings include:  Past Medical History:  Diagnosis Date  . Bronchiectasis ([place])    per pulmologist note -- dr Lake Bells-- related to multiple episodes pneumonia as adult  . CAD (coronary artery disease)    CARDIOLOGIST--  DR Wynonia Lawman  . Centrilobular emphysema ([place])   . Congenital renal cyst   . COPD (chronic obstructive pulmonary disease) ([facility])    pulmologist-  dr Lake Bells  . Diverticulosis of colon   . Dyspnea on exertion   . First degree heart block   . Hemorrhoids      CURRENTLY INFLARED DUE TO RADIATION THERAPY  . History of adenomatous polyp of colon    hx multiple tubular adenoma's and hyperplastic polyp's since 2001--- last colonoscopy 05-29-2015  . History of basal cell carcinoma excision    10-31-2012  nose  . History of external beam radiation therapy 02-01-2016  to 03-16-2016   prostate 45Gy  plus radioactive prostate seed implants 04-07-2016  . History of kidney stones   . Hyperlipidemia   . Hypertension   . Multiple pulmonary nodules    S/P  BILATERAL BRONCHOSCOPY  09-24-2014  no infection --    . Open-angle glaucoma    RIGHT EYE ONLY/OPEN ANGLE  . Prostate cancer [facility]) urologist-  dr eskridge/  oncologist-  dr Tammi Klippel   Stage T1c,  Gleason3+4, PSA 4.7--  s/p  external beam radiation therapy 02-01-2016 to 03-16-2016  . Seasonal allergies   . Thoracic aortic aneurysm ([place])    monitored by dr [name] (vascular surgeon)--- 10-09-2015 last CT 4.2cm, stable  . Wears dentures    full upper and lower parital    Past Surgical History:  Procedure Laterality Date  . CARDIAC CATHETERIZATION  09-04-2002  DR Wynonia Lawman   NORMAL LVF/  MILD TO MODERATE CAD INVOLVING PROXIMAL 40%  AND MID 40%  LAD/  POSTERIOR DESCENDING CX 70%  . CARDIAC CATHETERIZATION  06-05-2010  DR Fidela Juneau   NORMAL LM/  40% PROXIMAL MID LAD/ 70% DISTAL CFX/  80% PROXIMAL PDA/  SMALL & NONDOMINANT RCA  .  CARDIOVASCULAR STRESS TEST  06-01-2013   dr Wynonia Lawman   normal lexiscan myoview w/ no ischemia/ normal LV function and wall motion, ef 61%  . COLONOSCOPY W/ POLYPECTOMY  last one 05-29-2015  . CYSTOSCOPY N/A 04/07/2016   Procedure: CYSTOSCOPY FLEXIBLE;  Surgeon: Festus Aloe, MD;  Location[facility];  Service: Urology;  Laterality: N/A;  no seeds found in bladder  . CYSTOSCOPY WITH RETROGRADE PYELOGRAM, URETEROSCOPY AND STENT PLACEMENT Right 02/02/2013   Procedure: CYSTOSCOPY WITH RETROGRADE PYELOGRAM, AND Right STENT PLACEMENT;  Surgeon: Molli Hazard, MD;   Location: [facility];  Service: Urology;  Laterality: Right;  . CYSTOSCOPY WITH RETROGRADE PYELOGRAM, URETEROSCOPY AND STENT PLACEMENT Right 02/15/2013   Procedure: CYSTOSCOPY WITH RETROGRADE PYELOGRAM, URETEROSCOPY AND STENT EXCHANGE;  Surgeon: Molli Hazard, MD;  Location: [facility];  Service: Urology;  Laterality: Right;  . ESI     ; for cervical radiculopathy  . HEMORRHOID SURGERY  07-03-1999  . HOLMIUM LASER APPLICATION Right 07/20/2954   Procedure: HOLMIUM LASER APPLICATION;  Surgeon: Molli Hazard, MD;  Location: [facility];  Service: Urology;  Laterality: Right;  . PROSTATE BIOPSY    . RADIOACTIVE SEED IMPLANT N/A 04/07/2016   Procedure: RADIOACTIVE SEED IMPLANT/BRACHYTHERAPY IMPLANT;  Surgeon: Festus Aloe, MD;  Location: [facility];  Service: Urology;  Laterality: N/A;  55 seeds implanted  . SHOULDER OPEN ROTATOR CUFF REPAIR Bilateral RIGHT X2  LAST ONE 1997/  LEFT 2005   right inferior glenoid screw retained  . VIDEO BRONCHOSCOPY Bilateral 09/24/2014   Procedure: VIDEO BRONCHOSCOPY WITH FLUORO;  Surgeon: Juanito Doom, MD;  Location: [place];  Service: Cardiopulmonary;  Laterality: Bilateral;    Allergies  Allergen Reactions  . Lipitor [Atorvastatin] Other (See Comments)    MUSCLE ACHES  . Morphine And Related Other (See Comments)    Severe headache  . Niacin And Related Other (See Comments)    FLUSHING  . Ramipril Other (See Comments)    COUGH  . Sulfa Antibiotics Hives  . Codeine Other (See Comments)    CONSTIPATION    Family History  Problem Relation Age of Onset  . Stroke Father        in 50s  . Colon cancer Father        upper 58's  . Cancer Father        colon  . Heart attack Brother 25       stent  . Cancer Brother        pre cancerous bladder lesion  . Diabetes Paternal Uncle   . Cancer Cousin        prostate  . Cancer Other        bladder  . Arthritis Neg Hx   . Asthma Neg Hx   .  COPD Neg Hx   . Rectal cancer Neg Hx   . Stomach cancer Neg Hx      Social History   Social History  . Marital status: Married    Spouse name: N/A  . Number of children: N/A  . Years of education: N/A   Occupational History  . Not on file.   Social History Main Topics  . Smoking status: Former Smoker    Packs/day: 2.00    Years: 3.00    Types: Cigarettes    Quit date: 01/12/1970  . Smokeless tobacco: Never Used  . Alcohol use No  . Drug use: No  . Sexual activity: Not on file  Other Topics Concern  . Not on file   Social History Narrative  . No narrative on file     PHYSICAL EXAM:  VS: BP 110/70 (BP Location: Right Arm, Patient Position: Sitting, Cuff Size: Large)   Pulse (!) 111   Temp 98 F (36.7 C) (Oral)   Ht 6\' 1"  (1.854 m)   Wt 237 lb (107.5 kg)   SpO2 93%   BMI 31.27 kg/m  Physical Exam Gen: NAD, alert, cooperative with exam, well-appearing ENT: normal lips, normal nasal mucosa,  Eye: normal EOM, normal conjunctiva and lids CV:  no edema, +2 pedal pulses, S1-S2, tachycardic   Resp: no accessory muscle use, non-labored, clear to auscultation bilaterally, no crackles or wheezes GI: no masses, no lower abdominal tenderness, soft, +BS, no distention, no hernia  Rectum: No tenderness to palpation of the prostate, normal rectal tone, no stool palpated in the rectal vault Skin: no rashes, no areas of induration  Neuro: normal tone, normal sensation to touch Psych:  normal insight, alert and oriented MSK: normal gait, normal strength, some CVA tenderness on the right     ASSESSMENT & PLAN:   Increased frequency of urination His dipstick does not suggest infection but does have some blood on it. He does have a history of kidney stones and is currently possible source of his pain and symptoms. Does not appear to be proctitis. - Urinalysis and urine culture - CBC with differential - If these are normal then would consider obtaining a CT renal study

## 2016-10-13 NOTE — Patient Instructions (Addendum)
Thank you for coming in,   We will call you with the results from today.   If all this is normal then the next step would be a CT to check for kidney stones.    Please feel free to call with any questions or concerns at any time, at [phone]. --Dr. Raeford Razor

## 2016-10-13 NOTE — Assessment & Plan Note (Signed)
His dipstick does not suggest infection but does have some blood on it. He does have a history of kidney stones and is currently possible source of his pain and symptoms. Does not appear to be proctitis. - Urinalysis and urine culture - CBC with differential - If these are normal then would consider obtaining a CT renal study

## 2016-10-14 ENCOUNTER — Telehealth: Payer: Self-pay | Admitting: Emergency Medicine

## 2016-10-14 DIAGNOSIS — R1032 Left lower quadrant pain: Secondary | ICD-10-CM

## 2016-10-14 DIAGNOSIS — N200 Calculus of kidney: Secondary | ICD-10-CM

## 2016-10-14 DIAGNOSIS — R350 Frequency of micturition: Secondary | ICD-10-CM

## 2016-10-14 LAB — URINE CULTURE
MICRO NUMBER: 81092272
RESULT: NO GROWTH
SPECIMEN QUALITY: ADEQUATE

## 2016-10-14 NOTE — Telephone Encounter (Signed)
Pt called and asked that you give him a call back about his labs from yesterday. Please advise thanks.

## 2016-10-14 NOTE — Telephone Encounter (Signed)
Pt aware of results. Asking for pain medication. States that he can only tolerate demerol and everything else causes severe headaches. Please advise

## 2016-10-14 NOTE — Telephone Encounter (Signed)
He will take tylenol for his pain for now.   Rosemarie Ax, MD [facility] Primary Care & Sports Medicine 10/14/2016, 4:43 PM

## 2016-10-14 NOTE — Telephone Encounter (Signed)
His labs show some blood in urine but otherwise normal. I have order a CT renal study.   Rosemarie Ax, MD [facility] Primary Care & Sports Medicine 10/14/2016, 3:58 PM

## 2016-10-19 ENCOUNTER — Telehealth: Payer: Self-pay | Admitting: Internal Medicine

## 2016-10-19 NOTE — Telephone Encounter (Signed)
Canceled CT scan.   Rosemarie Ax, MD [facility] Primary Care & Sports Medicine 10/19/2016, 11:31 AM

## 2016-10-19 NOTE — Telephone Encounter (Signed)
Pt called regarding his CT for a possible kidney stone, patient states he passed the kidney stone yesterday and would like the order canceled, please advise

## 2016-12-01 DIAGNOSIS — L814 Other melanin hyperpigmentation: Secondary | ICD-10-CM | POA: Diagnosis not present

## 2016-12-01 DIAGNOSIS — D229 Melanocytic nevi, unspecified: Secondary | ICD-10-CM | POA: Diagnosis not present

## 2016-12-01 DIAGNOSIS — L820 Inflamed seborrheic keratosis: Secondary | ICD-10-CM | POA: Diagnosis not present

## 2016-12-01 DIAGNOSIS — Z85828 Personal history of other malignant neoplasm of skin: Secondary | ICD-10-CM | POA: Diagnosis not present

## 2016-12-01 DIAGNOSIS — L821 Other seborrheic keratosis: Secondary | ICD-10-CM | POA: Diagnosis not present

## 2017-01-10 ENCOUNTER — Other Ambulatory Visit: Payer: Self-pay | Admitting: Internal Medicine

## 2017-01-14 DIAGNOSIS — I25118 Atherosclerotic heart disease of native coronary artery with other forms of angina pectoris: Secondary | ICD-10-CM | POA: Diagnosis not present

## 2017-01-14 DIAGNOSIS — I2510 Atherosclerotic heart disease of native coronary artery without angina pectoris: Secondary | ICD-10-CM | POA: Diagnosis not present

## 2017-01-14 DIAGNOSIS — E669 Obesity, unspecified: Secondary | ICD-10-CM | POA: Diagnosis not present

## 2017-01-14 DIAGNOSIS — R0609 Other forms of dyspnea: Secondary | ICD-10-CM | POA: Diagnosis not present

## 2017-01-14 DIAGNOSIS — C61 Malignant neoplasm of prostate: Secondary | ICD-10-CM | POA: Diagnosis not present

## 2017-01-14 DIAGNOSIS — H401111 Primary open-angle glaucoma, right eye, mild stage: Secondary | ICD-10-CM | POA: Diagnosis not present

## 2017-01-14 DIAGNOSIS — I119 Hypertensive heart disease without heart failure: Secondary | ICD-10-CM | POA: Diagnosis not present

## 2017-01-14 DIAGNOSIS — E7849 Other hyperlipidemia: Secondary | ICD-10-CM | POA: Diagnosis not present

## 2017-01-17 NOTE — Patient Instructions (Addendum)
  Test(s) ordered today. Your results will be released to [place] (or called to you) after review, usually within 72hours after test completion. If any changes need to be made, you will be notified at that same time.   Medications reviewed and updated.  Changes include starting flexeril (muscle relaxer) as needed.   Also try the inhaler I gave your twice a day.   Your prescription(s) have been submitted to your pharmacy. Please take as directed and contact our office if you believe you are having problem(s) with the medication(s).   Please followup in one year

## 2017-01-17 NOTE — Progress Notes (Signed)
Subjective:    Patient ID: David Cantrell, male    DOB: 11/28/1940, 77 y.o.   MRN: [id number]  HPI The patient is here for follow up.  CAD, Hypertension: He is taking his medication daily.  He is following with cardiology and has seen them recently.  He is compliant with a low sodium diet.  He denies chest pain, palpitations, edema and regular headaches. He is not exercising regularly.       Hyperlipidemia: He is taking his medication daily. He is compliant with a low fat/cholesterol diet. He is not exercising regularly. He denies myalgias.  Shortness of breath has known idiopathic bronchiectasis, emphysema: He experiences daily cough, wheezing and shortness of breath.  In the past month or so he experienced 2 episodes of more severe flares of the symptoms.  Both occurred with exertion and one was with being exposed to cold air in addition to exertion.  He has seen pulmonary in the past, but not recently.  He was on Advair at one point, but at this time is only taking albuterol inhaler as needed.  He states this does not help.  He is not exercising regularly.  Piriformis muscle pain:  He sees a massage therapist intermittently.  He has intermittent pain in his lower back / hip region and orthopedics have said there was nothing wrong with the back.  A muscle relaxer has helped in the past and he would like to take one as needed.    Medications and allergies reviewed with patient and updated if appropriate.  Patient Active Problem List   Diagnosis Date Noted  . Increased frequency of urination 10/13/2016  . Thoracic aortic aneurysm ([place]) 04/12/2016  . Malignant neoplasm of prostate ([place]) 01/31/2016  . Tooth infection 06/19/2015  . Centrilobular emphysema ([place]) 12/12/2014  . Bronchiectasis without acute exacerbation ([place]) 09/11/2014  . Bulging lumbar disc 10/03/2013  . Open-angle glaucoma 05/05/2013  . 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 Multiple pulmonary nodules 02/24/2013  . Abnormal  CT scan, kidney 02/24/2013  . Basal cell carcinoma of ala nasi 02/24/2013  . At risk for obstructive sleep apnea 02/09/2013  . 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 COLONIC POLYPS, HX OF 12/13/2007  . Hyperlipidemia 12/03/2006  . Essential hypertension 12/03/2006  . Coronary atherosclerosis 12/03/2006  . 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 Nevus, non-neoplastic 09/24/2006  . RHINITIS, ALLERGIC NOS 09/24/2006  . Exercise-induced bronchospasm 09/24/2006    Current Outpatient Medications on File Prior to Visit  Medication Sig Dispense Refill  . acetaminophen (TYLENOL) 500 MG tablet Take 1,000 mg by mouth every 6 (six) hours as needed.    Marland Kitchen albuterol (PROVENTIL HFA;VENTOLIN HFA) 108 (90 BASE) MCG/ACT inhaler Inhale 2 puffs into the lungs every 6 (six) hours as needed for wheezing or shortness of breath. 1 Inhaler 2  . alfuzosin (UROXATRAL) 10 MG 24 hr tablet Take 1 tablet (10 mg total) by mouth daily before supper. 30 tablet 0  . aspirin EC 81 MG tablet Take 81 mg by mouth daily.     . brimonidine (ALPHAGAN) 0.15 % ophthalmic solution Place 1 drop into the right eye 2 (two) times daily.     . Coenzyme Q10 (COQ10) 100 MG CAPS Take 1 capsule by mouth daily.    Marland Kitchen dextromethorphan-guaiFENesin (MUCINEX DM) 30-600 MG 12hr tablet Take 1 tablet by mouth every morning.     . dorzolamide-timolol (COSOPT) 22.3-6.8 MG/ML ophthalmic solution Place 1 drop into the right eye  2 (two) times daily.    . fenofibrate 160 MG tablet Take 160 mg by mouth every morning.     . Flaxseed, Linseed, 1300 MG CAPS Take by mouth 2 (two) times daily with a meal.    . Fluticasone-Salmeterol (ADVAIR DISKUS) 250-50 MCG/DOSE AEPB Inhale 1 puff into the lungs 2 (two) times daily. (Patient taking differently: Inhale 1 puff into the lungs 2 (two) times daily as needed. ) 14 each 0  . GARLIC PO Take 1 capsule by mouth daily.    . hydrocortisone ointment 0.5 % Apply 1 application topically 2 (two)  times daily.    . metoprolol tartrate (LOPRESSOR) 25 MG tablet Take 1 tablet (25 mg total) by mouth 2 (two) times daily. --- Office visit needed for further refills 60 tablet 0  . Multiple Vitamins-Minerals (MULTIVITAMIN ADULTS PO) Take 1 tablet by mouth daily.     Marland Kitchen NITROSTAT 0.4 MG SL tablet Place 0.4 mg under the tongue every 5 (five) minutes as needed.     . Omega-3 Fatty Acids (FISH OIL) 1200 MG CAPS Take 1 capsule by mouth 2 (two) times daily. 1 by mouth daily    . psyllium (METAMUCIL) 58.6 % packet Take 1 packet by mouth daily.    . rosuvastatin (CRESTOR) 10 MG tablet Take 10 mg by mouth 2 (two) times a week. Monday and thursday    . shark liver oil-cocoa butter (PREPARATION H) 0.25-88.44 % suppository Place 1 suppository rectally as needed for hemorrhoids.    . tamsulosin (FLOMAX) 0.4 MG CAPS capsule Take 1 capsule (0.4 mg total) by mouth daily. (Patient taking differently: Take 0.8 mg by mouth daily. ) 30 capsule 2   No current facility-administered medications on file prior to visit.     Past Medical History:  Diagnosis Date  . Bronchiectasis ([place])    per pulmologist note -- dr Lake Bells-- related to multiple episodes pneumonia as adult  . CAD (coronary artery disease)    CARDIOLOGIST--  DR Wynonia Lawman  . Centrilobular emphysema ([place])   . Congenital renal cyst   . COPD (chronic obstructive pulmonary disease) ([facility])    pulmologist-  dr Lake Bells  . Diverticulosis of colon   . Dyspnea on exertion   . First degree heart block   . Hemorrhoids    CURRENTLY INFLARED DUE TO RADIATION THERAPY  . History of adenomatous polyp of colon    hx multiple tubular adenoma's and hyperplastic polyp's since 2001--- last colonoscopy 05-29-2015  . History of basal cell carcinoma excision    10-31-2012  nose  . History of external beam radiation therapy 02-01-2016  to 03-16-2016   prostate 45Gy  plus radioactive prostate seed implants 04-07-2016  . History of kidney stones   . Hyperlipidemia   .  Hypertension   . Multiple pulmonary nodules    S/P  BILATERAL BRONCHOSCOPY  09-24-2014  no infection --    . Open-angle glaucoma    RIGHT EYE ONLY/OPEN ANGLE  . Prostate cancer [facility]) urologist-  dr eskridge/  oncologist-  dr Tammi Klippel   Stage T1c,  Gleason3+4, PSA 4.7--  s/p  external beam radiation therapy 02-01-2016 to 03-16-2016  . Seasonal allergies   . Thoracic aortic aneurysm ([place])    monitored by dr [name] (vascular surgeon)--- 10-09-2015 last CT 4.2cm, stable  . Wears dentures    full upper and lower parital    Past Surgical History:  Procedure Laterality Date  . CARDIAC CATHETERIZATION  09-04-2002  DR Wynonia Lawman   NORMAL LVF/  MILD TO MODERATE CAD INVOLVING PROXIMAL 40%  AND MID 40%  LAD/  POSTERIOR DESCENDING CX 70%  . CARDIAC CATHETERIZATION  06-05-2010  DR Fidela Juneau   NORMAL LM/  40% PROXIMAL MID LAD/ 70% DISTAL CFX/  80% PROXIMAL PDA/  SMALL & NONDOMINANT RCA  . CARDIOVASCULAR STRESS TEST  06-01-2013   dr Wynonia Lawman   normal lexiscan myoview w/ no ischemia/ normal LV function and wall motion, ef 61%  . COLONOSCOPY W/ POLYPECTOMY  last one 05-29-2015  . CYSTOSCOPY N/A 04/07/2016   Procedure: CYSTOSCOPY FLEXIBLE;  Surgeon: Festus Aloe, MD;  Location: [facility];  Service: Urology;  Laterality: N/A;  no seeds found in bladder  . CYSTOSCOPY WITH RETROGRADE PYELOGRAM, URETEROSCOPY AND STENT PLACEMENT Right 02/02/2013   Procedure: CYSTOSCOPY WITH RETROGRADE PYELOGRAM, AND Right STENT PLACEMENT;  Surgeon: Molli Hazard, MD;  Location: [facility];  Service: Urology;  Laterality: Right;  . CYSTOSCOPY WITH RETROGRADE PYELOGRAM, URETEROSCOPY AND STENT PLACEMENT Right 02/15/2013   Procedure: CYSTOSCOPY WITH RETROGRADE PYELOGRAM, URETEROSCOPY AND STENT EXCHANGE;  Surgeon: Molli Hazard, MD;  Location[facility];  Service: Urology;  Laterality: Right;  . ESI     ; for cervical radiculopathy  . HEMORRHOID SURGERY  07-03-1999  . HOLMIUM LASER APPLICATION  Right 01/17/6061   Procedure: HOLMIUM LASER APPLICATION;  Surgeon: Molli Hazard, MD;  Location: [facility];  Service: Urology;  Laterality: Right;  . PROSTATE BIOPSY    . RADIOACTIVE SEED IMPLANT N/A 04/07/2016   Procedure: RADIOACTIVE SEED IMPLANT/BRACHYTHERAPY IMPLANT;  Surgeon: Festus Aloe, MD;  Location: [facility];  Service: Urology;  Laterality: N/A;  55 seeds implanted  . SHOULDER OPEN ROTATOR CUFF REPAIR Bilateral RIGHT X2  LAST ONE 1997/  LEFT 2005   right inferior glenoid screw retained  . VIDEO BRONCHOSCOPY Bilateral 09/24/2014   Procedure: VIDEO BRONCHOSCOPY WITH FLUORO;  Surgeon: Juanito Doom, MD;  Location: [place];  Service: Cardiopulmonary;  Laterality: Bilateral;    Social History   Socioeconomic History  . Marital status: Married    Spouse name: None  . Number of children: None  . Years of education: None  . Highest education level: None  Social Needs  . Financial resource strain: None  . Food insecurity - worry: None  . Food insecurity - inability: None  . Transportation needs - medical: None  . Transportation needs - non-medical: None  Occupational History  . None  Tobacco Use  . Smoking status: Former Smoker    Packs/day: 2.00    Years: 3.00    Pack years: 6.00    Types: Cigarettes    Last attempt to quit: 01/12/1970    Years since quitting: 47.0  . Smokeless tobacco: Never Used  Substance and Sexual Activity  . Alcohol use: No    Alcohol/week: 0.0 oz  . Drug use: No  . Sexual activity: None  Other Topics Concern  . None  Social History Narrative  . None    Family History  Problem Relation Age of Onset  . Stroke Father        in 53s  . Colon cancer Father        upper 76's  . Cancer Father        colon  . Heart attack Brother 32       stent  . Cancer Brother        pre cancerous bladder lesion  . Diabetes Paternal Uncle   . Cancer Cousin  prostate  . Cancer Other        bladder    . Arthritis Neg Hx   . Asthma Neg Hx   . COPD Neg Hx   . Rectal cancer Neg Hx   . Stomach cancer Neg Hx     Review of Systems  Constitutional: Negative for chills and fever.  Respiratory: Positive for cough, shortness of breath (with exertion) and wheezing.   Cardiovascular: Negative for chest pain, palpitations and leg swelling.  Neurological: Positive for light-headedness. Negative for headaches.       Objective:   Vitals:   01/18/17 1558  BP: 136/86  Pulse: 73  Resp: 16  Temp: 98 F (36.7 C)  SpO2: 96%   Wt Readings from Last 3 Encounters:  01/18/17 237 lb (107.5 kg)  10/13/16 237 lb (107.5 kg)  05/06/16 237 lb (107.5 kg)   Body mass index is 31.27 kg/m.   Physical Exam    Constitutional: Appears well-developed and well-nourished. No distress.  HENT:  Head: Normocephalic and atraumatic.  Neck: Neck supple. No tracheal deviation present. No thyromegaly present.  No cervical lymphadenopathy Cardiovascular: Normal rate, regular rhythm and normal heart sounds.   No murmur heard. No carotid bruit .  No edema Pulmonary/Chest: Effort normal and breath sounds normal. No respiratory distress. No has no wheezes. No rales.  Skin: Skin is warm and dry. Not diaphoretic.  Psychiatric: Normal mood and affect. Behavior is normal.      Assessment & Plan:    See Problem List for Assessment and Plan of chronic medical problems.

## 2017-01-18 ENCOUNTER — Encounter: Payer: Self-pay | Admitting: Internal Medicine

## 2017-01-18 ENCOUNTER — Ambulatory Visit (INDEPENDENT_AMBULATORY_CARE_PROVIDER_SITE_OTHER): Payer: PPO | Admitting: Internal Medicine

## 2017-01-18 VITALS — BP 136/86 | HR 73 | Temp 98.0°F | Resp 16 | Wt 237.0 lb

## 2017-01-18 DIAGNOSIS — J479 Bronchiectasis, uncomplicated: Secondary | ICD-10-CM

## 2017-01-18 DIAGNOSIS — E7849 Other hyperlipidemia: Secondary | ICD-10-CM

## 2017-01-18 DIAGNOSIS — J432 Centrilobular emphysema: Secondary | ICD-10-CM

## 2017-01-18 DIAGNOSIS — G5700 Lesion of sciatic nerve, unspecified lower limb: Secondary | ICD-10-CM | POA: Diagnosis not present

## 2017-01-18 DIAGNOSIS — I2510 Atherosclerotic heart disease of native coronary artery without angina pectoris: Secondary | ICD-10-CM

## 2017-01-18 DIAGNOSIS — I10 Essential (primary) hypertension: Secondary | ICD-10-CM | POA: Diagnosis not present

## 2017-01-18 MED ORDER — UMECLIDINIUM-VILANTEROL 62.5-25 MCG/INH IN AEPB: 1.0000 | INHALATION_SPRAY | Freq: Every day | RESPIRATORY_TRACT | 6 refills | Status: DC

## 2017-01-18 MED ORDER — CYCLOBENZAPRINE HCL 5 MG PO TABS: 5.0000 mg | ORAL_TABLET | Freq: Every day | ORAL | 1 refills | Status: DC | PRN

## 2017-01-18 MED ORDER — EPINEPHRINE 0.3 MG/0.3ML IJ SOAJ: 0.3000 mg | Freq: Once | INTRAMUSCULAR | 5 refills | Status: AC

## 2017-01-18 NOTE — Assessment & Plan Note (Signed)
No longer following with pulmonary Has had a couple of flares of emphysema/bronchiectasis Currently only using pro-air as needed Sample of anoro given and we can try other samples if needed Stressed the importance of increasing exercise May benefit from seeing pulmonary again in the near future

## 2017-01-18 NOTE — Assessment & Plan Note (Addendum)
Has daily SOB, wheeze, cough Takes albuterol prn, which does not seem that effective No longer seen pulmonary We will try anoro -sample given.  Advised him to take for 1 week and if it helps we will send a prescription to his pharmacy.  If it does not help we will consider trying a different inhaler Advised that he would benefit from maintenance inhaler Encouraged increasing his exercise as tolerated

## 2017-01-18 NOTE — Assessment & Plan Note (Signed)
BP well controlled Current regimen effective and well tolerated Continue current medications at current doses cmp  

## 2017-01-18 NOTE — Assessment & Plan Note (Signed)
Check lipid panel  Continue daily statin Regular exercise and healthy diet encouraged  

## 2017-01-18 NOTE — Assessment & Plan Note (Signed)
Following with cardiology No concerning symptoms Shortness of breath is likely related to lung condition Check labs including CBC, TSH, CMP, lipid panel

## 2017-01-18 NOTE — Assessment & Plan Note (Signed)
Has benefited from physical therapy and massage, but not doing regularly due to cost Has taken a muscle relaxer in the past and that has helped and he would only take this as needed, but would like to try that again Flexeril 5 mg daily as needed, which she did tolerate well in the past

## 2017-01-19 ENCOUNTER — Other Ambulatory Visit (INDEPENDENT_AMBULATORY_CARE_PROVIDER_SITE_OTHER): Payer: PPO

## 2017-01-19 ENCOUNTER — Telehealth: Payer: Self-pay | Admitting: Internal Medicine

## 2017-01-19 DIAGNOSIS — I2510 Atherosclerotic heart disease of native coronary artery without angina pectoris: Secondary | ICD-10-CM | POA: Diagnosis not present

## 2017-01-19 DIAGNOSIS — I10 Essential (primary) hypertension: Secondary | ICD-10-CM

## 2017-01-19 DIAGNOSIS — E7849 Other hyperlipidemia: Secondary | ICD-10-CM | POA: Diagnosis not present

## 2017-01-19 LAB — CBC WITH DIFFERENTIAL/PLATELET
BASOS ABS: 0.1 10*3/uL (ref 0.0–0.1)
Basophils Relative: 1.2 % (ref 0.0–3.0)
EOS ABS: 0.3 10*3/uL (ref 0.0–0.7)
Eosinophils Relative: 5.1 % — ABNORMAL HIGH (ref 0.0–5.0)
HCT: 42.8 % (ref 39.0–52.0)
Hemoglobin: 14.6 g/dL (ref 13.0–17.0)
LYMPHS ABS: 2.2 10*3/uL (ref 0.7–4.0)
Lymphocytes Relative: 33 % (ref 12.0–46.0)
MCHC: 34.2 g/dL (ref 30.0–36.0)
MCV: 95 fl (ref 78.0–100.0)
Monocytes Absolute: 0.9 10*3/uL (ref 0.1–1.0)
Monocytes Relative: 13.2 % — ABNORMAL HIGH (ref 3.0–12.0)
NEUTROS ABS: 3.1 10*3/uL (ref 1.4–7.7)
NEUTROS PCT: 47.5 % (ref 43.0–77.0)
PLATELETS: 283 10*3/uL (ref 150.0–400.0)
RBC: 4.5 Mil/uL (ref 4.22–5.81)
RDW: 12.9 % (ref 11.5–15.5)
WBC: 6.6 10*3/uL (ref 4.0–10.5)

## 2017-01-19 LAB — LIPID PANEL
CHOLESTEROL: 130 mg/dL (ref 0–200)
HDL: 31.2 mg/dL — ABNORMAL LOW (ref >39.00)
LDL Cholesterol: 69 mg/dL (ref 0–99)
NonHDL: 98.68
TRIGLYCERIDES: 147 mg/dL (ref 0.0–149.0)
Total CHOL/HDL Ratio: 4
VLDL: 29.4 mg/dL (ref 0.0–40.0)

## 2017-01-19 LAB — TSH: TSH: 4.69 u[IU]/mL — ABNORMAL HIGH (ref 0.35–4.50)

## 2017-01-19 LAB — COMPREHENSIVE METABOLIC PANEL
ALBUMIN: 4.3 g/dL (ref 3.5–5.2)
ALK PHOS: 62 U/L (ref 39–117)
ALT: 19 U/L (ref 0–53)
AST: 21 U/L (ref 0–37)
BILIRUBIN TOTAL: 0.9 mg/dL (ref 0.2–1.2)
BUN: 15 mg/dL (ref 6–23)
CALCIUM: 9.3 mg/dL (ref 8.4–10.5)
CO2: 25 mEq/L (ref 19–32)
Chloride: 105 mEq/L (ref 96–112)
Creatinine, Ser: 1.5 mg/dL (ref 0.40–1.50)
GFR: 48.45 mL/min — AB (ref >60.00)
Glucose, Bld: 111 mg/dL — ABNORMAL HIGH (ref 70–99)
Potassium: 3.8 mEq/L (ref 3.5–5.1)
Sodium: 138 mEq/L (ref 135–145)
TOTAL PROTEIN: 7.8 g/dL (ref 6.0–8.3)

## 2017-01-19 NOTE — Telephone Encounter (Signed)
Copied from [place]. Topic: Quick Communication - See Telephone Encounter >> Jan 19, 2017  3:42 PM Corie Chiquito, [place] wrote: CRM for notification. See Telephone encounter for: Patient wife called and would like to know why was his Metoprolol Tartrate was changed to a 30 day supply instead of a 90 day supply. If someone could give them a call back at [phone]. Also he has questions about the inhaler that was given to him as well. He needs to know how many puff should he do a day of the Anoro  01/19/17.

## 2017-01-20 NOTE — Telephone Encounter (Signed)
Pt needing PA on the flexeril forwarding to MD assistant to proceed,,,/lmb

## 2017-01-20 NOTE — Telephone Encounter (Signed)
Called patient and told him that when he needs an appointment with refills you may get fewer pills until your appointment. This is in regard to his metoprolol tartrate. So with the next refill just ask for 90 day supply. Pt voiced understanding. Also went over the dosage for his Anoro. He was not sure of how often he needed to do it. Explained to patient that it is one puff daily. He voiced understanding. Pt's wife also stated that his pcp needs to authorize his refill on the Flexeril with his insurance company. So the insurance would pay for it.

## 2017-01-21 NOTE — Telephone Encounter (Signed)
Form has been received, completed, and faxed back to [facility]

## 2017-01-21 NOTE — Telephone Encounter (Signed)
PA attempted online, unable to complete. [employer] and was informed that their system is currently down and they would fax over paperwork once the system was up and running. Pts wife has been notified.

## 2017-01-25 NOTE — Telephone Encounter (Signed)
PA approved through 01/21/2018. Pts wife notified and pharmacy notified via fax.

## 2017-01-26 ENCOUNTER — Telehealth: Payer: Self-pay | Admitting: Emergency Medicine

## 2017-01-26 NOTE — Telephone Encounter (Signed)
Yes, ok to use [facility] to send if he needs a prescription

## 2017-01-26 NOTE — Telephone Encounter (Signed)
Copied from [place] [id number]. Topic: General - Other >> Jan 26, 2017 12:22 PM Carolyn Stare wrote:  Pt call to ask if he can use Breo inhaler he said Dr Quay Burow gave him Anoro inhaler  would like a call back   [phone]

## 2017-01-27 MED ORDER — FLUTICASONE FUROATE-VILANTEROL 100-25 MCG/INH IN AEPB: 1.0000 | INHALATION_SPRAY | Freq: Every day | RESPIRATORY_TRACT | 0 refills | Status: DC

## 2017-01-27 NOTE — Telephone Encounter (Signed)
Spoke with pt, states that he had a Breo Inhaler 100/25 but was expried. Advised pt not to use that RX. Sample placed upfront for pick-up. Will send in RX if works. Anoro does not work for pt.

## 2017-02-08 DIAGNOSIS — C61 Malignant neoplasm of prostate: Secondary | ICD-10-CM | POA: Diagnosis not present

## 2017-02-15 DIAGNOSIS — N5201 Erectile dysfunction due to arterial insufficiency: Secondary | ICD-10-CM | POA: Diagnosis not present

## 2017-02-15 DIAGNOSIS — R3912 Poor urinary stream: Secondary | ICD-10-CM | POA: Diagnosis not present

## 2017-02-15 DIAGNOSIS — C61 Malignant neoplasm of prostate: Secondary | ICD-10-CM | POA: Diagnosis not present

## 2017-02-15 DIAGNOSIS — R1084 Generalized abdominal pain: Secondary | ICD-10-CM | POA: Diagnosis not present

## 2017-03-08 ENCOUNTER — Other Ambulatory Visit: Payer: Self-pay | Admitting: Internal Medicine

## 2017-04-02 ENCOUNTER — Other Ambulatory Visit: Payer: Self-pay | Admitting: Surgery

## 2017-04-02 DIAGNOSIS — R911 Solitary pulmonary nodule: Secondary | ICD-10-CM

## 2017-04-13 ENCOUNTER — Ambulatory Visit (INDEPENDENT_AMBULATORY_CARE_PROVIDER_SITE_OTHER): Payer: PPO | Admitting: Internal Medicine

## 2017-04-13 ENCOUNTER — Telehealth: Payer: Self-pay | Admitting: Emergency Medicine

## 2017-04-13 ENCOUNTER — Encounter: Payer: Self-pay | Admitting: Internal Medicine

## 2017-04-13 VITALS — BP 120/72 | HR 64 | Temp 98.4°F | Resp 16 | Wt 237.0 lb

## 2017-04-13 DIAGNOSIS — M545 Low back pain, unspecified: Secondary | ICD-10-CM | POA: Insufficient documentation

## 2017-04-13 MED ORDER — METHOCARBAMOL 500 MG PO TABS: 500.0000 mg | ORAL_TABLET | Freq: Four times a day (QID) | ORAL | 1 refills | Status: DC

## 2017-04-13 MED ORDER — KETOROLAC TROMETHAMINE 60 MG/2ML IM SOLN
60.0000 mg | Freq: Once | INTRAMUSCULAR | Status: AC
  Administered 2017-04-13: 60 mg via INTRAMUSCULAR

## 2017-04-13 NOTE — Patient Instructions (Addendum)
An MRI was ordered.  Your received a Toradol injection today.    Medications reviewed and updated.  Changes include starting methocarbamol - a muscle relaxer.    Take two tylenol 2-3 times a day.  Ice and heat your back  Your prescription(s) have been submitted to your pharmacy. Please take as directed and contact our office if you believe you are having problem(s) with the medication(s).

## 2017-04-13 NOTE — Progress Notes (Signed)
Subjective:    Patient ID: David Cantrell, male    DOB: 07-22-1940, 77 y.o.   MRN: [id number]  HPI The patient is here for an acute visit.   Back pain:  He has been having pain in the center lower back and on the left lower back and across the top of his hips on both side.  The pain started years ago, but became much worse 6 months ago and it has been getting worse.      Any slight twist in the back causes severe pain.  Bending over is ok but getting up is severely painful.  He has intermittent spasms and the pain is severe and he can not move at all.     He denies radiation and numbness/tingling in the legs.    He has seen a massage therapist and it helped initially.  He has seen orthopedics - Dr Nelva Bush, Dr Tomi Likens and a chiropractor.    Had to have someone help him out of church the other day due to severe pain - he was not able to walk to his truck.    He is taking a muscle relaxer and it helps minimally- flexeril 5 mg.  He has taken tylenol.   Did have injections in his lower back in past - helped briefly.    Lumbar x-ray:  Last done two years ago.    MRI 2013:  Minimal disc bulge L3-4, L4-5, L5-S. No discus herniations.  Small right lateral annular tears and L3-4 and L4-5. Mild facet DJD L5-S1.    Medications and allergies reviewed with patient and updated if appropriate.  Patient Active Problem List   Diagnosis Date Noted  . Piriformis syndrome 01/18/2017  . Increased frequency of urination 10/13/2016  . Thoracic aortic aneurysm ([place]) 04/12/2016  . Malignant neoplasm of prostate ([place]) 01/31/2016  . Centrilobular emphysema ([place]) 12/12/2014  . Bronchiectasis without acute exacerbation ([place]) 09/11/2014  . Bulging lumbar disc 10/03/2013  . Open-angle glaucoma 05/05/2013  . 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 Multiple pulmonary nodules 02/24/2013  . Abnormal CT scan, kidney 02/24/2013  . Basal cell carcinoma of ala nasi 02/24/2013  . At risk for obstructive sleep apnea  02/09/2013  . 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 COLONIC POLYPS, HX OF 12/13/2007  . Hyperlipidemia 12/03/2006  . Essential hypertension 12/03/2006  . Coronary atherosclerosis 12/03/2006  . 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 Nevus, non-neoplastic 09/24/2006  . RHINITIS, ALLERGIC NOS 09/24/2006  . Exercise-induced bronchospasm 09/24/2006    Current Outpatient Medications on File Prior to Visit  Medication Sig Dispense Refill  . acetaminophen (TYLENOL) 500 MG tablet Take 1,000 mg by mouth every 6 (six) hours as needed.    Marland Kitchen albuterol (PROVENTIL HFA;VENTOLIN HFA) 108 (90 BASE) MCG/ACT inhaler Inhale 2 puffs into the lungs every 6 (six) hours as needed for wheezing or shortness of breath. 1 Inhaler 2  . alfuzosin (UROXATRAL) 10 MG 24 hr tablet Take 1 tablet (10 mg total) by mouth daily before supper. 30 tablet 0  . aspirin EC 81 MG tablet Take 81 mg by mouth daily.     . brimonidine (ALPHAGAN) 0.15 % ophthalmic solution Place 1 drop into the right eye 2 (two) times daily.     . Coenzyme Q10 (COQ10) 100 MG CAPS Take 1 capsule by mouth daily.    . cyclobenzaprine (FLEXERIL) 5 MG tablet Take 1 tablet (5 mg total) by mouth daily as needed for muscle spasms.  30 tablet 1  . dextromethorphan-guaiFENesin (MUCINEX DM) 30-600 MG 12hr tablet Take 1 tablet by mouth every morning.     . dorzolamide-timolol (COSOPT) 22.3-6.8 MG/ML ophthalmic solution Place 1 drop into the right eye 2 (two) times daily.    . fenofibrate 160 MG tablet Take 160 mg by mouth every morning.     . Flaxseed, Linseed, 1300 MG CAPS Take by mouth 2 (two) times daily with a meal.    . fluticasone furoate-vilanterol (BREO ELLIPTA) 100-25 MCG/INH AEPB Inhale 1 puff into the lungs daily. 1 each 0  . GARLIC PO Take 1 capsule by mouth daily.    . hydrocortisone ointment 0.5 % Apply 1 application topically 2 (two) times daily.    . metoprolol tartrate (LOPRESSOR) 25 MG tablet Take 1 tablet (25 mg total)  by mouth 2 (two) times daily. 180 tablet 1  . Multiple Vitamins-Minerals (MULTIVITAMIN ADULTS PO) Take 1 tablet by mouth daily.     Marland Kitchen NITROSTAT 0.4 MG SL tablet Place 0.4 mg under the tongue every 5 (five) minutes as needed.     . Omega-3 Fatty Acids (FISH OIL) 1200 MG CAPS Take 1 capsule by mouth 2 (two) times daily. 1 by mouth daily    . psyllium (METAMUCIL) 58.6 % packet Take 1 packet by mouth daily.    . rosuvastatin (CRESTOR) 10 MG tablet Take 10 mg by mouth 2 (two) times a week. Monday and thursday    . shark liver oil-cocoa butter (PREPARATION H) 0.25-88.44 % suppository Place 1 suppository rectally as needed for hemorrhoids.    . tamsulosin (FLOMAX) 0.4 MG CAPS capsule Take 1 capsule (0.4 mg total) by mouth daily. (Patient taking differently: Take 0.8 mg by mouth daily. ) 30 capsule 2   No current facility-administered medications on file prior to visit.     Past Medical History:  Diagnosis Date  . Bronchiectasis ([place])    per pulmologist note -- dr Lake Bells-- related to multiple episodes pneumonia as adult  . CAD (coronary artery disease)    CARDIOLOGIST--  DR Wynonia Lawman  . Centrilobular emphysema ([place])   . Congenital renal cyst   . COPD (chronic obstructive pulmonary disease) ([facility])    pulmologist-  dr Lake Bells  . Diverticulosis of colon   . Dyspnea on exertion   . First degree heart block   . Hemorrhoids    CURRENTLY INFLARED DUE TO RADIATION THERAPY  . History of adenomatous polyp of colon    hx multiple tubular adenoma's and hyperplastic polyp's since 2001--- last colonoscopy 05-29-2015  . History of basal cell carcinoma excision    10-31-2012  nose  . History of external beam radiation therapy 02-01-2016  to 03-16-2016   prostate 45Gy  plus radioactive prostate seed implants 04-07-2016  . History of kidney stones   . Hyperlipidemia   . Hypertension   . Multiple pulmonary nodules    S/P  BILATERAL BRONCHOSCOPY  09-24-2014  no infection --    . Open-angle glaucoma    RIGHT  EYE ONLY/OPEN ANGLE  . Prostate cancer [facility]) urologist-  dr eskridge/  oncologist-  dr Tammi Klippel   Stage T1c,  Gleason3+4, PSA 4.7--  s/p  external beam radiation therapy 02-01-2016 to 03-16-2016  . Seasonal allergies   . Thoracic aortic aneurysm ([place])    monitored by dr [name] (vascular surgeon)--- 10-09-2015 last CT 4.2cm, stable  . Wears dentures    full upper and lower parital    Past Surgical History:  Procedure Laterality Date  .  CARDIAC CATHETERIZATION  09-04-2002  DR Wynonia Lawman   NORMAL LVF/  MILD TO MODERATE CAD INVOLVING PROXIMAL 40%  AND MID 40%  LAD/  POSTERIOR DESCENDING CX 70%  . CARDIAC CATHETERIZATION  06-05-2010  DR Fidela Juneau   NORMAL LM/  40% PROXIMAL MID LAD/ 70% DISTAL CFX/  80% PROXIMAL PDA/  SMALL & NONDOMINANT RCA  . CARDIOVASCULAR STRESS TEST  06-01-2013   dr Wynonia Lawman   normal lexiscan myoview w/ no ischemia/ normal LV function and wall motion, ef 61%  . COLONOSCOPY W/ POLYPECTOMY  last one 05-29-2015  . CYSTOSCOPY N/A 04/07/2016   Procedure: CYSTOSCOPY FLEXIBLE;  Surgeon: Festus Aloe, MD;  Location: [facility];  Service: Urology;  Laterality: N/A;  no seeds found in bladder  . CYSTOSCOPY WITH RETROGRADE PYELOGRAM, URETEROSCOPY AND STENT PLACEMENT Right 02/02/2013   Procedure: CYSTOSCOPY WITH RETROGRADE PYELOGRAM, AND Right STENT PLACEMENT;  Surgeon: Molli Hazard, MD;  Location: [facility];  Service: Urology;  Laterality: Right;  . CYSTOSCOPY WITH RETROGRADE PYELOGRAM, URETEROSCOPY AND STENT PLACEMENT Right 02/15/2013   Procedure: CYSTOSCOPY WITH RETROGRADE PYELOGRAM, URETEROSCOPY AND STENT EXCHANGE;  Surgeon: Molli Hazard, MD;  Location[facility];  Service: Urology;  Laterality: Right;  . ESI     ; for cervical radiculopathy  . HEMORRHOID SURGERY  07-03-1999  . HOLMIUM LASER APPLICATION Right 06/17/4401   Procedure: HOLMIUM LASER APPLICATION;  Surgeon: Molli Hazard, MD;  Location: [facility];  Service:  Urology;  Laterality: Right;  . PROSTATE BIOPSY    . RADIOACTIVE SEED IMPLANT N/A 04/07/2016   Procedure: RADIOACTIVE SEED IMPLANT/BRACHYTHERAPY IMPLANT;  Surgeon: Festus Aloe, MD;  Location: [facility];  Service: Urology;  Laterality: N/A;  55 seeds implanted  . SHOULDER OPEN ROTATOR CUFF REPAIR Bilateral RIGHT X2  LAST ONE 1997/  LEFT 2005   right inferior glenoid screw retained  . VIDEO BRONCHOSCOPY Bilateral 09/24/2014   Procedure: VIDEO BRONCHOSCOPY WITH FLUORO;  Surgeon: Juanito Doom, MD;  Location: [facility];  Service: Cardiopulmonary;  Laterality: Bilateral;    Social History   Socioeconomic History  . Marital status: Married    Spouse name: Not on file  . Number of children: Not on file  . Years of education: Not on file  . Highest education level: Not on file  Occupational History  . Not on file  Social Needs  . Financial resource strain: Not on file  . Food insecurity:    Worry: Not on file    Inability: Not on file  . Transportation needs:    Medical: Not on file    Non-medical: Not on file  Tobacco Use  . Smoking status: Former Smoker    Packs/day: 2.00    Years: 3.00    Pack years: 6.00    Types: Cigarettes    Last attempt to quit: 01/12/1970    Years since quitting: 47.2  . Smokeless tobacco: Never Used  Substance and Sexual Activity  . Alcohol use: No    Alcohol/week: 0.0 oz  . Drug use: No  . Sexual activity: Not on file  Lifestyle  . Physical activity:    Days per week: Not on file    Minutes per session: Not on file  . Stress: Not on file  Relationships  . Social connections:    Talks on phone: Not on file    Gets together: Not on file    Attends religious service: Not on file    Active member of  club or organization: Not on file    Attends meetings of clubs or organizations: Not on file    Relationship status: Not on file  Other Topics Concern  . Not on file  Social History Narrative  . Not on file    Family  History  Problem Relation Age of Onset  . Stroke Father        in 70s  . Colon cancer Father        upper 26's  . Cancer Father        colon  . Heart attack Brother 62       stent  . Cancer Brother        pre cancerous bladder lesion  . Diabetes Paternal Uncle   . Cancer Cousin        prostate  . Cancer Other        bladder  . Arthritis Neg Hx   . Asthma Neg Hx   . COPD Neg Hx   . Rectal cancer Neg Hx   . Stomach cancer Neg Hx     Review of Systems  Constitutional: Negative for chills and fever.  Gastrointestinal:       No stool incontinence  Genitourinary:       No bladder incontinence  Musculoskeletal: Positive for back pain (lower back feels unstable) and myalgias (lower back).  Neurological: Negative for weakness and numbness.       Objective:   Vitals:   04/13/17 1422  BP: 120/72  Pulse: 64  Resp: 16  Temp: 98.4 F (36.9 C)  SpO2: 98%   BP Readings from Last 3 Encounters:  04/13/17 120/72  01/18/17 136/86  10/13/16 110/70   Wt Readings from Last 3 Encounters:  04/13/17 237 lb (107.5 kg)  01/18/17 237 lb (107.5 kg)  10/13/16 237 lb (107.5 kg)   Body mass index is 31.27 kg/m.   Physical Exam  Constitutional: He appears well-developed and well-nourished. He appears distressed (in moderate pain with certain movements).  HENT:  Head: Normocephalic and atraumatic.  Musculoskeletal: He exhibits no edema.  Tenderness in lower lumbar spine, w/o deformity, tenderness left side of vertebrae with palpation and increased pain with movement  Neurological:  Normal sensation and strength in b/l LE, difficulty walking due to pain - needed a wheelchair  Skin: Skin is warm and dry.           Assessment & Plan:    See Problem List for Assessment and Plan of chronic medical problems.

## 2017-04-13 NOTE — Assessment & Plan Note (Signed)
Chronic pain with more acute worsening over the past several months  - progressively getting worse, severe at times MRI in 2013 showed mild disc bulges, annular tear and DJD.  Pain is now significantly worse No radiculopathy Conservative treatment has not helped, including massage, exercises, flexeril, tylenol Has seen ortho, chiropractor Given severity of pain and mores acute worsening for several months will order an MRI Tylenol 2-3 times daily Trial of methocarbamol if covered - take regularly Ice/heat toradol 60 mg IM x 1 today

## 2017-04-13 NOTE — Telephone Encounter (Signed)
PA completed, awaiting response.   KEY [id number]

## 2017-04-14 DIAGNOSIS — H401131 Primary open-angle glaucoma, bilateral, mild stage: Secondary | ICD-10-CM | POA: Diagnosis not present

## 2017-04-14 NOTE — Telephone Encounter (Signed)
PA Approved, pharmacy notified.

## 2017-04-15 ENCOUNTER — Telehealth: Payer: Self-pay | Admitting: Internal Medicine

## 2017-04-15 DIAGNOSIS — M545 Low back pain, unspecified: Secondary | ICD-10-CM

## 2017-04-15 DIAGNOSIS — G8929 Other chronic pain: Secondary | ICD-10-CM

## 2017-04-15 NOTE — Telephone Encounter (Signed)
Spoke with pt to inform. Advised pt he could contact GI.

## 2017-04-15 NOTE — Telephone Encounter (Signed)
Referral ordered

## 2017-04-15 NOTE — Telephone Encounter (Signed)
Copied from [place] [id number]. Topic: Referral - Request >> Apr 15, 2017  9:15 AM Margot Ables wrote: Reason for CRM: Pt is needing a referral to schedule appt with Dr. Glenna Fellows for low back pain. Pt is awaiting call to schedule MRI with [facility]. [facility] at [facility]. [address] [address] [place], [place]  [address]  [phone] Phone [phone] Fax

## 2017-04-16 ENCOUNTER — Ambulatory Visit
Admission: RE | Admit: 2017-04-16 | Discharge: 2017-04-16 | Disposition: A | Discharge status: Home / Self Care | Payer: PPO | Source: Ambulatory Visit | Attending: Internal Medicine | Admitting: Internal Medicine

## 2017-04-16 DIAGNOSIS — M545 Low back pain, unspecified: Secondary | ICD-10-CM

## 2017-04-16 DIAGNOSIS — M48061 Spinal stenosis, lumbar region without neurogenic claudication: Secondary | ICD-10-CM | POA: Diagnosis not present

## 2017-04-18 ENCOUNTER — Encounter: Payer: Self-pay | Admitting: Internal Medicine

## 2017-04-19 ENCOUNTER — Telehealth: Payer: Self-pay | Admitting: Emergency Medicine

## 2017-04-19 NOTE — Telephone Encounter (Signed)
Copied from [place] [id number]. Topic: Referral - Request >> Apr 15, 2017  9:15 AM Margot Ables wrote: Reason for CRM: Pt is needing a referral to schedule appt with Dr. Glenna Fellows for low back pain. Pt is awaiting call to schedule MRI with [facility]. [facility] at [facility] [address] [place], [place]  [address]  [phone] Phone [phone] Fax >> Apr 19, 2017  3:06 PM Synthia Innocent wrote: Patient checking status, please advise >> Apr 19, 2017  3:09 PM [place], Isidoro Donning wrote: I see a message that was sent to the patient yesterday regarding his MRI results. It does show that he has read it as well. I'm not sure what he is following up about unless he has further questions.

## 2017-04-21 NOTE — Telephone Encounter (Signed)
Per Cecille Rubin [facility], referral has been sent to [facility].

## 2017-04-22 NOTE — Telephone Encounter (Signed)
(  Spoke with Cecille Rubin - Dr Carloyn Manner said he DOES NOT need to see a neurosurgeon.  This is based on his MRI. )  Please call him and let him know that Dr Carloyn Manner received the referral and refused his MRI.  He does not feel he needs to see a neurosurgeon.  I agree with this because of how his MRI looks.  I would recommend he consider seeing dr Tamala Julian - our sports medicine doctor.  We can set this up if he agrees

## 2017-04-22 NOTE — Telephone Encounter (Signed)
Is he interested in seeing an orthopedic?  Just because it looks like he does not need surgery does not mean he is not in a lot of pain - we just need to find the right person to help.

## 2017-04-22 NOTE — Telephone Encounter (Signed)
Spoke with pt and advised of response below from Dr. Quay Burow. Pt stated he was disappointed and disgusted with how things have went and he refused to see Dr. Tamala Julian at this time because he feels like he has seen sports medicine doctors and there was nothing could be done from that angle. He expressed that he was aggravated with how things have been going he stays in pain but he has been taking meds that Dr. Quay Burow has prescribed and hoping that will help soon. I told him I would send a message back to Dr. Quay Burow to see if there was anything else suggested but that was all I could do. Please advise if there are any alternative solutions for pt. Thank you.

## 2017-04-22 NOTE — Telephone Encounter (Signed)
Received fax from [facility] and Dr. Carloyn Manner states pt does need a neurosurgeon and has declined to see him. What options would you like me to give him when I call the pt to let him know.

## 2017-04-22 NOTE — Telephone Encounter (Signed)
LVM for pt to call back in regards.  

## 2017-04-26 NOTE — Telephone Encounter (Signed)
There is no blood work that would show inflammation in the back.  There are some inflammatory markers but they are nonspecific and would not give [place] any information about the process in his back.  Most likely there is some inflammation.  Norco is an opioid pain medication and that is the absolute last resort.  He would need to see pain management to get this prescribed because of the regulations of prescribing narcotic pain medication.

## 2017-04-26 NOTE — Telephone Encounter (Signed)
Pt would like to know if it is ok with trying a pain rx called Norco. He has a rx that was given to him by another doctor a while back and wonders if that will help his pain and wanted to know your thoughts on it. Also he asked about blood work to see if there is any inflammation in his back. Please advise.

## 2017-04-27 NOTE — Telephone Encounter (Signed)
Spoke with pt to inform. Advised highly of see Dr Tamala Julian and Pt declined. He would like to complete the medication that was given by Dr Quay Burow first. Advised him to all back if he changed his mind.

## 2017-04-28 ENCOUNTER — Ambulatory Visit: Payer: PPO | Admitting: Surgery

## 2017-04-28 ENCOUNTER — Encounter: Payer: Self-pay | Admitting: Surgery

## 2017-04-28 ENCOUNTER — Other Ambulatory Visit: Payer: Self-pay

## 2017-04-28 ENCOUNTER — Ambulatory Visit
Admission: RE | Admit: 2017-04-28 | Discharge: 2017-04-28 | Disposition: A | Discharge status: Home / Self Care | Payer: PPO | Source: Ambulatory Visit | Attending: Surgery | Admitting: Surgery

## 2017-04-28 VITALS — BP 125/72 | HR 57 | Resp 15 | Ht 73.0 in | Wt 235.2 lb

## 2017-04-28 DIAGNOSIS — I712 Thoracic aortic aneurysm, without rupture: Secondary | ICD-10-CM | POA: Diagnosis not present

## 2017-04-28 DIAGNOSIS — R918 Other nonspecific abnormal finding of lung field: Secondary | ICD-10-CM

## 2017-04-28 DIAGNOSIS — R911 Solitary pulmonary nodule: Secondary | ICD-10-CM

## 2017-04-28 DIAGNOSIS — I7121 Aneurysm of the ascending aorta, without rupture: Secondary | ICD-10-CM

## 2017-04-28 NOTE — Progress Notes (Signed)
HPI:  The patient returns today for follow-up of a 4.2 cm fusiform ascending aortic aneurysm.  He was also noted to have multiple nodular opacities throughout the lungs there was stable with a new 2 mm nodule in the right middle lobe when I saw him last year.  His main complaints is I saw him last year has been lower back pain which has been significant limiting his activity.  He has undergone workup by his primary physician and had an MR I of the lumbar spine earlier this month showing only mild degenerative changes and nothing that was felt to be the cause of his symptoms.  Current Outpatient Medications  Medication Sig Dispense Refill  . acetaminophen (TYLENOL) 500 MG tablet Take 1,000 mg by mouth every 6 (six) hours as needed.    Marland Kitchen albuterol (PROVENTIL HFA;VENTOLIN HFA) 108 (90 BASE) MCG/ACT inhaler Inhale 2 puffs into the lungs every 6 (six) hours as needed for wheezing or shortness of breath. 1 Inhaler 2  . alfuzosin (UROXATRAL) 10 MG 24 hr tablet Take 1 tablet (10 mg total) by mouth daily before supper. 30 tablet 0  . aspirin EC 81 MG tablet Take 81 mg by mouth daily.     . brimonidine (ALPHAGAN) 0.15 % ophthalmic solution Place 1 drop into the right eye 2 (two) times daily.     . Coenzyme Q10 (COQ10) 100 MG CAPS Take 1 capsule by mouth daily.    . cyclobenzaprine (FLEXERIL) 5 MG tablet Take 1 tablet (5 mg total) by mouth daily as needed for muscle spasms. 30 tablet 1  . dextromethorphan-guaiFENesin (MUCINEX DM) 30-600 MG 12hr tablet Take 1 tablet by mouth every morning.     . dorzolamide-timolol (COSOPT) 22.3-6.8 MG/ML ophthalmic solution Place 1 drop into the right eye 2 (two) times daily.    . fenofibrate 160 MG tablet Take 160 mg by mouth every morning.     . Flaxseed, Linseed, 1300 MG CAPS Take by mouth 2 (two) times daily with a meal.    . fluticasone furoate-vilanterol (BREO ELLIPTA) 100-25 MCG/INH AEPB Inhale 1 puff into the lungs daily. 1 each 0  . GARLIC PO Take 1 capsule  by mouth daily.    . hydrocortisone ointment 0.5 % Apply 1 application topically 2 (two) times daily.    . methocarbamol (ROBAXIN) 500 MG tablet Take 1 tablet (500 mg total) by mouth 4 (four) times daily. 120 tablet 1  . metoprolol tartrate (LOPRESSOR) 25 MG tablet Take 1 tablet (25 mg total) by mouth 2 (two) times daily. 180 tablet 1  . Multiple Vitamins-Minerals (MULTIVITAMIN ADULTS PO) Take 1 tablet by mouth daily.     Marland Kitchen NITROSTAT 0.4 MG SL tablet Place 0.4 mg under the tongue every 5 (five) minutes as needed.     . Omega-3 Fatty Acids (FISH OIL) 1200 MG CAPS Take 1 capsule by mouth 2 (two) times daily. 1 by mouth daily    . psyllium (METAMUCIL) 58.6 % packet Take 1 packet by mouth daily.    . rosuvastatin (CRESTOR) 10 MG tablet Take 10 mg by mouth 2 (two) times a week. Monday and thursday    . shark liver oil-cocoa butter (PREPARATION H) 0.25-88.44 % suppository Place 1 suppository rectally as needed for hemorrhoids.    . tamsulosin (FLOMAX) 0.4 MG CAPS capsule Take 1 capsule (0.4 mg total) by mouth daily. (Patient taking differently: Take 0.8 mg by mouth daily. ) 30 capsule 2   No current facility-administered medications for  this visit.      Physical Exam: BP 125/72 (BP Location: Right Arm, Patient Position: Sitting, Cuff Size: Large)   Pulse (!) 57   Resp 15   Ht 6\' 1"  (1.854 m)   Wt 235 lb 3.2 oz (106.7 kg)   SpO2 97% Comment: ON RA  BMI 31.03 kg/m  He looks well. Cardiac exam shows a regular rate and rhythm with normal heart sounds.  There is no murmur. Lungs are clear.  Diagnostic Tests:  CLINICAL DATA:  Pulmonary nodules.  Thoracic aortic aneurysm.  EXAM: CT CHEST WITHOUT CONTRAST  TECHNIQUE: Multidetector CT imaging of the chest was performed following the standard protocol without IV contrast.  COMPARISON:  CT scan of May 06, 2016.  FINDINGS: Cardiovascular: Atherosclerosis of thoracic aorta is noted. Stable 4.3 cm ascending thoracic aortic aneurysm is  noted transverse aortic arch measures 3.4 cm. Proximal descending thoracic aorta measures 3.2 cm. Normal cardiac size. No pericardial effusion is noted. Coronary artery calcifications are noted.  Mediastinum/Nodes: No enlarged mediastinal or axillary lymph nodes. Thyroid gland, trachea, and esophagus demonstrate no significant findings.  Lungs/Pleura: No pneumothorax or pleural effusion is noted. Stable 7 mm nodule is noted in left lower lobe best seen on image number 117 of series 8. Stable 4 mm nodule is noted in left upper lobe best seen on image number 90 of series 8. Stable mild biapical scarring is noted. Stable 6 mm nodule seen in right lower lobe best seen on image number 102 of series 8. Stable 7 mm nodule is noted in right lower lobe best seen on image number 117 of series 8. Stable 5 mm nodule is noted laterally in right lung base best seen on image number 134 series 8. Stable 4 mm subpleural nodule noted posteriorly in right lower lobe best seen on image number 140 of series 8. Stable 7 mm subpleural nodule seen in right lower lobe posteriorly best seen on image number 126 of series 8. Mild emphysematous disease is noted in both upper lobes.  Upper Abdomen: Stable right renal cysts are noted. No other abnormality seen in visualized portion of upper abdomen.  Musculoskeletal: No chest wall mass or suspicious bone lesions identified.  IMPRESSION: Stable 4.3 cm ascending thoracic aortic aneurysm. Recommend annual imaging followup by CTA or MRA. This recommendation follows 2010 ACCF/AHA/AATS/ACR/ASA/SCA/SCAI/SIR/STS/SVM Guidelines for the Diagnosis and Management of Patients with Thoracic Aortic Disease. Circulation. 2010; 121: S283-T517.  Coronary artery calcifications are noted suggesting coronary artery disease.  Stable bilateral pulmonary nodules are noted, with the largest measuring 7 mm. Follow-up unenhanced CT scan in 12 months is recommended to ensure  stability.  Aortic Atherosclerosis (ICD10-I70.0) and Emphysema (ICD10-J43.9).   Electronically Signed   By: Marijo Conception, M.D.   On: 04/28/2017 11:17  Impression:  He has a stable 4.3 cm fusiform ascending aortic aneurysm.  This is well below the surgical threshold of 5.5 cm.  His blood pressure is under good control when he is on a beta-blocker.  I reviewed the CT images with the patient and his wife and answered their questions.  The bilateral lung nodules are all stable with the largest measuring about 7 mm.  Plan:  I will plan to see him back in 1 year with a CT scan of the chest to follow-up on these lung nodules as well as the ascending aortic aneurysm.  I spent 15 minutes performing this established patient evaluation and > 50% of this time was spent face to face counseling and  coordinating the care of this patient's aortic aneurysm and bilateral pulmonary nodules.    Gaye Pollack, MD [facility] [phone]

## 2017-05-17 ENCOUNTER — Telehealth: Payer: Self-pay | Admitting: Emergency Medicine

## 2017-05-17 DIAGNOSIS — M545 Low back pain, unspecified: Secondary | ICD-10-CM

## 2017-05-17 NOTE — Telephone Encounter (Signed)
Copied from [place]. Topic: Referral - Request >> May 17, 2017  1:24 PM Synthia Innocent wrote: Reason for CRM: Requesting referral for back injections at [facility]

## 2017-05-18 NOTE — Telephone Encounter (Signed)
Injections may help.  I would recommend him seeing Dr Maryjean Ka - he is a neurosurgeon who also does pain management and injections.  He would be able to see a surgeon and get advice regarding his back and he can also do the injections.  I cannot refer directly to [facility] imaging because I am not qualified to tell them where to put the injections and that is 1 of the requirements for a referral there for injections.  Let me know if he agrees and I can order the referral.

## 2017-05-18 NOTE — Telephone Encounter (Signed)
Spoke with pt. States he is okay with referral being placed to Dr Maryjean Ka.

## 2017-05-21 ENCOUNTER — Encounter (HOSPITAL_COMMUNITY): Payer: Self-pay

## 2017-05-21 ENCOUNTER — Ambulatory Visit: Payer: PPO | Admitting: Family Medicine

## 2017-05-21 ENCOUNTER — Ambulatory Visit: Payer: Self-pay

## 2017-05-21 ENCOUNTER — Ambulatory Visit: Payer: PPO | Admitting: Internal Medicine

## 2017-05-21 ENCOUNTER — Emergency Department (HOSPITAL_COMMUNITY): Payer: PPO

## 2017-05-21 ENCOUNTER — Emergency Department (HOSPITAL_COMMUNITY)
Admission: EM | Admit: 2017-05-21 | Discharge: 2017-05-21 | Disposition: A | Discharge status: Home / Self Care | Payer: PPO | Attending: Emergency Medicine | Admitting: Emergency Medicine

## 2017-05-21 DIAGNOSIS — Z8546 Personal history of malignant neoplasm of prostate: Secondary | ICD-10-CM | POA: Diagnosis not present

## 2017-05-21 DIAGNOSIS — Z79899 Other long term (current) drug therapy: Secondary | ICD-10-CM | POA: Diagnosis not present

## 2017-05-21 DIAGNOSIS — R918 Other nonspecific abnormal finding of lung field: Secondary | ICD-10-CM | POA: Diagnosis not present

## 2017-05-21 DIAGNOSIS — Z7982 Long term (current) use of aspirin: Secondary | ICD-10-CM | POA: Insufficient documentation

## 2017-05-21 DIAGNOSIS — Z87891 Personal history of nicotine dependence: Secondary | ICD-10-CM | POA: Diagnosis not present

## 2017-05-21 DIAGNOSIS — I2510 Atherosclerotic heart disease of native coronary artery without angina pectoris: Secondary | ICD-10-CM | POA: Insufficient documentation

## 2017-05-21 DIAGNOSIS — R1084 Generalized abdominal pain: Secondary | ICD-10-CM | POA: Diagnosis not present

## 2017-05-21 DIAGNOSIS — R1032 Left lower quadrant pain: Secondary | ICD-10-CM

## 2017-05-21 DIAGNOSIS — I493 Ventricular premature depolarization: Secondary | ICD-10-CM | POA: Diagnosis not present

## 2017-05-21 DIAGNOSIS — Z85828 Personal history of other malignant neoplasm of skin: Secondary | ICD-10-CM | POA: Insufficient documentation

## 2017-05-21 DIAGNOSIS — K5792 Diverticulitis of intestine, part unspecified, without perforation or abscess without bleeding: Secondary | ICD-10-CM | POA: Diagnosis not present

## 2017-05-21 DIAGNOSIS — J449 Chronic obstructive pulmonary disease, unspecified: Secondary | ICD-10-CM | POA: Insufficient documentation

## 2017-05-21 DIAGNOSIS — I10 Essential (primary) hypertension: Secondary | ICD-10-CM | POA: Diagnosis not present

## 2017-05-21 DIAGNOSIS — K5732 Diverticulitis of large intestine without perforation or abscess without bleeding: Secondary | ICD-10-CM | POA: Diagnosis not present

## 2017-05-21 LAB — URINALYSIS, ROUTINE W REFLEX MICROSCOPIC
Bilirubin Urine: NEGATIVE
Glucose, UA: NEGATIVE mg/dL
Hgb urine dipstick: NEGATIVE
Ketones, ur: NEGATIVE mg/dL
Leukocytes, UA: NEGATIVE
NITRITE: NEGATIVE
PH: 5 (ref 5.0–8.0)
Protein, ur: NEGATIVE mg/dL
SPECIFIC GRAVITY, URINE: 1.021 (ref 1.005–1.030)

## 2017-05-21 LAB — COMPREHENSIVE METABOLIC PANEL
ALBUMIN: 3.9 g/dL (ref 3.5–5.0)
ALK PHOS: 66 U/L (ref 38–126)
ALT: 23 U/L (ref 17–63)
ANION GAP: 6 (ref 5–15)
AST: 25 U/L (ref 15–41)
BILIRUBIN TOTAL: 1.1 mg/dL (ref 0.3–1.2)
BUN: 17 mg/dL (ref 6–20)
CALCIUM: 9.5 mg/dL (ref 8.9–10.3)
CO2: 25 mmol/L (ref 22–32)
Chloride: 109 mmol/L (ref 101–111)
Creatinine, Ser: 1.52 mg/dL — ABNORMAL HIGH (ref 0.61–1.24)
GFR calc non Af Amer: 43 mL/min — ABNORMAL LOW (ref >60)
GFR, EST AFRICAN AMERICAN: 50 mL/min — AB (ref >60)
Glucose, Bld: 109 mg/dL — ABNORMAL HIGH (ref 65–99)
POTASSIUM: 5 mmol/L (ref 3.5–5.1)
SODIUM: 140 mmol/L (ref 135–145)
TOTAL PROTEIN: 7.9 g/dL (ref 6.5–8.1)

## 2017-05-21 LAB — LIPASE, BLOOD: Lipase: 47 U/L (ref 11–51)

## 2017-05-21 LAB — CBC
HEMATOCRIT: 41.9 % (ref 39.0–52.0)
HEMOGLOBIN: 14.6 g/dL (ref 13.0–17.0)
MCH: 32.9 pg (ref 26.0–34.0)
MCHC: 34.8 g/dL (ref 30.0–36.0)
MCV: 94.4 fL (ref 78.0–100.0)
Platelets: 241 10*3/uL (ref 150–400)
RBC: 4.44 MIL/uL (ref 4.22–5.81)
RDW: 13 % (ref 11.5–15.5)
WBC: 12.5 10*3/uL — ABNORMAL HIGH (ref 4.0–10.5)

## 2017-05-21 LAB — I-STAT CG4 LACTIC ACID, ED
LACTIC ACID, VENOUS: 1.6 mmol/L (ref 0.5–1.9)
LACTIC ACID, VENOUS: 1.29 mmol/L (ref 0.5–1.9)

## 2017-05-21 LAB — I-STAT TROPONIN, ED: TROPONIN I, POC: 0 ng/mL (ref 0.00–0.08)

## 2017-05-21 MED ORDER — FENTANYL CITRATE (PF) 100 MCG/2ML IJ SOLN
50.0000 ug | Freq: Once | INTRAMUSCULAR | Status: AC
  Administered 2017-05-21: 50 ug via INTRAVENOUS
  Filled 2017-05-21: qty 2

## 2017-05-21 MED ORDER — CIPROFLOXACIN HCL 500 MG PO TABS
500.0000 mg | ORAL_TABLET | Freq: Once | ORAL | Status: AC
  Administered 2017-05-21: 500 mg via ORAL
  Filled 2017-05-21: qty 1

## 2017-05-21 MED ORDER — IOPAMIDOL (ISOVUE-370) INJECTION 76%
100.0000 mL | Freq: Once | INTRAVENOUS | Status: AC | PRN
  Administered 2017-05-21: 100 mL via INTRAVENOUS

## 2017-05-21 MED ORDER — OXYCODONE-ACETAMINOPHEN 5-325 MG PO TABS: 1.0000 | ORAL_TABLET | ORAL | 0 refills | Status: DC | PRN

## 2017-05-21 MED ORDER — METRONIDAZOLE 500 MG PO TABS: 500.0000 mg | ORAL_TABLET | Freq: Three times a day (TID) | ORAL | 0 refills | Status: AC

## 2017-05-21 MED ORDER — ONDANSETRON HCL 4 MG PO TABS: 4.0000 mg | ORAL_TABLET | Freq: Three times a day (TID) | ORAL | 0 refills | Status: DC | PRN

## 2017-05-21 MED ORDER — METRONIDAZOLE 500 MG PO TABS
500.0000 mg | ORAL_TABLET | Freq: Once | ORAL | Status: AC
  Administered 2017-05-21: 500 mg via ORAL
  Filled 2017-05-21: qty 1

## 2017-05-21 MED ORDER — IOPAMIDOL (ISOVUE-370) INJECTION 76%
INTRAVENOUS | Status: AC
  Filled 2017-05-21: qty 100

## 2017-05-21 MED ORDER — OXYCODONE-ACETAMINOPHEN 5-325 MG PO TABS: 1.0000 | ORAL_TABLET | Freq: Once | ORAL | Status: DC

## 2017-05-21 MED ORDER — CIPROFLOXACIN HCL 500 MG PO TABS: 500.0000 mg | ORAL_TABLET | Freq: Two times a day (BID) | ORAL | 0 refills | Status: AC

## 2017-05-21 MED ORDER — ONDANSETRON HCL 4 MG/2ML IJ SOLN
4.0000 mg | Freq: Once | INTRAMUSCULAR | Status: AC
  Administered 2017-05-21: 4 mg via INTRAVENOUS
  Filled 2017-05-21: qty 2

## 2017-05-21 NOTE — ED Triage Notes (Signed)
Pt presents for evaluation of LLQ abd pain since this AM. Pt reports hx of diverticulitis. Denies N/V/D or urinary issues.

## 2017-05-21 NOTE — Telephone Encounter (Signed)
Woke up at 0800 with abdominal pain like a "cramping" senation. Went to [facility] and had a BM, no change in the pain.  Pain is located from the  navel to halfway to side just above belt line. Rates pain 8-9 now. Pain is constant and is severe. Pt states that it hurts to touch it and nothing makes it feel better. Pt denies any vomiting, nausea or diarrhea. Care advice given. Pt advised to go to the ED for evaluation.   Reason for Disposition . [1] SEVERE pain AND [2] age > 54  Answer Assessment - Initial Assessment Questions 1. LOCATION: "Where does it hurt?"      From navel to halfway to side above the belt line 2. RADIATION: "Does the pain shoot anywhere else?" (e.g., chest, back)   no 3. ONSET: "When did the pain begin?" (Minutes, hours or days ago)       0800 this am  4. SUDDEN: "Gradual or sudden onset?"     Woke up with pain 5. PATTERN "Does the pain come and go, or is it constant?"    - If constant: "Is it getting better, staying the same, or worsening?"      (Note: Constant means the pain never goes away completely; most serious pain is constant and it progresses)     - If intermittent: "How long does it last?" "Do you have pain now?"     (Note: Intermittent means the pain goes away completely between bouts)     constant 6. SEVERITY: "How bad is the pain?"  (e.g., Scale 1-10; mild, moderate, or severe)    - MILD (1-3): doesn't interfere with normal activities, abdomen soft and not tender to touch     - MODERATE (4-7): interferes with normal activities or awakens from sleep, tender to touch     - SEVERE (8-10): excruciating pain, doubled over, unable to do any normal activities       Severe 8-9/10 7. RECURRENT SYMPTOM: "Have you ever had this type of abdominal pain before?" If so, ask: "When was the last time?" and "What happened that time?"      Yes but not this bad-6 months ago took SL pills that helped- took 2 SL pills that are expired that did not help 8. CAUSE: "What do you think is  causing the abdominal pain?"     Diverticulitis  9. RELIEVING/AGGRAVATING FACTORS: "What makes it better or worse?" (e.g., movement, antacids, bowel movement)     Nothing makes it beter- touching it makes it worse 10. OTHER SYMPTOMS: "Has there been any vomiting, diarrhea, constipation, or urine problems?"       no  Protocols used: ABDOMINAL PAIN - MALE-A-AH

## 2017-05-21 NOTE — ED Notes (Signed)
ED Provider at bedside. 

## 2017-05-21 NOTE — ED Notes (Signed)
Patient transported to CT 

## 2017-05-21 NOTE — ED Provider Notes (Signed)
[facility] EMERGENCY DEPARTMENT Provider Note   CSN: [id number] Arrival date & time: 05/21/17  1148     History   Chief Complaint Chief Complaint  Patient presents with  . Abdominal Pain    HPI David Cantrell is a 77 y.o. male.  The history is provided by the patient and medical records. No language interpreter was used.  Abdominal Pain   This is a new problem. The current episode started 12 to 24 hours ago. The problem occurs constantly. The problem has not changed since onset.The pain is associated with an unknown factor. The pain is located in the generalized abdominal region. The quality of the pain is aching and cramping. The pain is moderate. Pertinent negatives include fever, diarrhea, nausea, vomiting, constipation, dysuria, frequency and headaches. The symptoms are aggravated by palpation. Nothing relieves the symptoms.    Past Medical History:  Diagnosis Date  . Bronchiectasis ([place])    per pulmologist note -- dr Lake Bells-- related to multiple episodes pneumonia as adult  . CAD (coronary artery disease)    CARDIOLOGIST--  DR Wynonia Lawman  . Centrilobular emphysema ([place])   . Congenital renal cyst   . COPD (chronic obstructive pulmonary disease) ([facility])    pulmologist-  dr Lake Bells  . Diverticulosis of colon   . Dyspnea on exertion   . First degree heart block   . Hemorrhoids    CURRENTLY INFLARED DUE TO RADIATION THERAPY  . History of adenomatous polyp of colon    hx multiple tubular adenoma's and hyperplastic polyp's since 2001--- last colonoscopy 05-29-2015  . History of basal cell carcinoma excision    10-31-2012  nose  . History of external beam radiation therapy 02-01-2016  to 03-16-2016   prostate 45Gy  plus radioactive prostate seed implants 04-07-2016  . History of kidney stones   . Hyperlipidemia   . Hypertension   . Multiple pulmonary nodules    S/P  BILATERAL BRONCHOSCOPY  09-24-2014  no infection --    . Open-angle glaucoma    RIGHT EYE  ONLY/OPEN ANGLE  . Prostate cancer [facility]) urologist-  dr eskridge/  oncologist-  dr Tammi Klippel   Stage T1c,  Gleason3+4, PSA 4.7--  s/p  external beam radiation therapy 02-01-2016 to 03-16-2016  . Seasonal allergies   . Thoracic aortic aneurysm ([place])    monitored by dr [name] (vascular surgeon)--- 10-09-2015 last CT 4.2cm, stable  . Wears dentures    full upper and lower parital    Patient Active Problem List   Diagnosis Date Noted  . Lumbar back pain 04/13/2017  . Piriformis syndrome 01/18/2017  . Increased frequency of urination 10/13/2016  . Thoracic aortic aneurysm ([place]) 04/12/2016  . Malignant neoplasm of prostate ([place]) 01/31/2016  . Centrilobular emphysema ([place]) 12/12/2014  . Bronchiectasis without acute exacerbation ([place]) 09/11/2014  . Bulging lumbar disc 10/03/2013  . Open-angle glaucoma 05/05/2013  . 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 Multiple pulmonary nodules 02/24/2013  . Abnormal CT scan, kidney 02/24/2013  . Basal cell carcinoma of ala nasi 02/24/2013  . At risk for obstructive sleep apnea 02/09/2013  . 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 COLONIC POLYPS, HX OF 12/13/2007  . Hyperlipidemia 12/03/2006  . Essential hypertension 12/03/2006  . Coronary atherosclerosis 12/03/2006  . 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 Nevus, non-neoplastic 09/24/2006  . RHINITIS, ALLERGIC NOS 09/24/2006  . Exercise-induced bronchospasm 09/24/2006    Past Surgical History:  Procedure Laterality Date  . CARDIAC CATHETERIZATION  09-04-2002  DR Wynonia Lawman   NORMAL LVF/  MILD TO MODERATE CAD INVOLVING PROXIMAL 40%  AND MID 40%  LAD/  POSTERIOR DESCENDING CX 70%  . CARDIAC CATHETERIZATION  06-05-2010  DR Fidela [place]   NORMAL LM/  40% PROXIMAL MID LAD/ 70% DISTAL CFX/  80% PROXIMAL PDA/  SMALL & NONDOMINANT RCA  . CARDIOVASCULAR STRESS TEST  06-01-2013   dr Wynonia Lawman   normal lexiscan myoview w/ no ischemia/ normal LV function and wall motion, ef 61%  . COLONOSCOPY W/  POLYPECTOMY  last one 05-29-2015  . CYSTOSCOPY N/A 04/07/2016   Procedure: CYSTOSCOPY FLEXIBLE;  Surgeon: Festus Aloe, MD;  Location: [facility];  Service: Urology;  Laterality: N/A;  no seeds found in bladder  . CYSTOSCOPY WITH RETROGRADE PYELOGRAM, URETEROSCOPY AND STENT PLACEMENT Right 02/02/2013   Procedure: CYSTOSCOPY WITH RETROGRADE PYELOGRAM, AND Right STENT PLACEMENT;  Surgeon: Molli Hazard, MD;  Location: [facility];  Service: Urology;  Laterality: Right;  . CYSTOSCOPY WITH RETROGRADE PYELOGRAM, URETEROSCOPY AND STENT PLACEMENT Right 02/15/2013   Procedure: CYSTOSCOPY WITH RETROGRADE PYELOGRAM, URETEROSCOPY AND STENT EXCHANGE;  Surgeon: Molli Hazard, MD;  Location: [facility];  Service: Urology;  Laterality: Right;  . ESI     ; for cervical radiculopathy  . HEMORRHOID SURGERY  07-03-1999  . HOLMIUM LASER APPLICATION Right 01/17/1094   Procedure: HOLMIUM LASER APPLICATION;  Surgeon: Molli Hazard, MD;  Location: [facility];  Service: Urology;  Laterality: Right;  . PROSTATE BIOPSY    . RADIOACTIVE SEED IMPLANT N/A 04/07/2016   Procedure: RADIOACTIVE SEED IMPLANT/BRACHYTHERAPY IMPLANT;  Surgeon: Festus Aloe, MD;  Location: [facility];  Service: Urology;  Laterality: N/A;  55 seeds implanted  . SHOULDER OPEN ROTATOR CUFF REPAIR Bilateral RIGHT X2  LAST ONE 1997/  LEFT 2005   right inferior glenoid screw retained  . VIDEO BRONCHOSCOPY Bilateral 09/24/2014   Procedure: VIDEO BRONCHOSCOPY WITH FLUORO;  Surgeon: Juanito Doom, MD;  Location: [facility];  Service: Cardiopulmonary;  Laterality: Bilateral;        Home Medications    Prior to Admission medications   Medication Sig Start Date End Date Taking? Authorizing Provider  acetaminophen (TYLENOL) 500 MG tablet Take 1,000 mg by mouth every 6 (six) hours as needed.    [provider]  albuterol (PROVENTIL HFA;VENTOLIN HFA) 108 (90  BASE) MCG/ACT inhaler Inhale 2 puffs into the lungs every 6 (six) hours as needed for wheezing or shortness of breath. 08/13/14   Hendricks Limes, MD  alfuzosin (UROXATRAL) 10 MG 24 hr tablet Take 1 tablet (10 mg total) by mouth daily before supper. 04/07/16   Festus Aloe, MD  aspirin EC 81 MG tablet Take 81 mg by mouth daily.     [provider]  brimonidine (ALPHAGAN) 0.15 % ophthalmic solution Place 1 drop into the right eye 2 (two) times daily.  02/08/16   [provider]  Coenzyme Q10 (COQ10) 100 MG CAPS Take 1 capsule by mouth daily.    [provider]  cyclobenzaprine (FLEXERIL) 5 MG tablet Take 1 tablet (5 mg total) by mouth daily as needed for muscle spasms. 01/18/17   [name], MD  dextromethorphan-guaiFENesin (MUCINEX DM) 30-600 MG 12hr tablet Take 1 tablet by mouth every morning.     [provider]  dorzolamide-timolol (COSOPT) 22.3-6.8 MG/ML ophthalmic solution Place 1 drop into the right eye 2 (two) times daily.    [provider]  fenofibrate 160 MG tablet Take  160 mg by mouth every morning.  12/24/15   [provider]  Flaxseed, Linseed, 1300 MG CAPS Take by mouth 2 (two) times daily with a meal.    [provider]  fluticasone furoate-vilanterol (BREO ELLIPTA) 100-25 MCG/INH AEPB Inhale 1 puff into the lungs daily. 01/27/17   [name], MD  GARLIC PO Take 1 capsule by mouth daily.    [provider]  hydrocortisone ointment 0.5 % Apply 1 application topically 2 (two) times daily.    [provider]  methocarbamol (ROBAXIN) 500 MG tablet Take 1 tablet (500 mg total) by mouth 4 (four) times daily. 04/13/17   [name], MD  metoprolol tartrate (LOPRESSOR) 25 MG tablet Take 1 tablet (25 mg total) by mouth 2 (two) times daily. 03/09/17   [name], MD  Multiple Vitamins-Minerals (MULTIVITAMIN ADULTS PO) Take 1 tablet by mouth daily.     [provider]  NITROSTAT 0.4 MG SL tablet  Place 0.4 mg under the tongue every 5 (five) minutes as needed.  05/29/11   [provider]  Omega-3 Fatty Acids (FISH OIL) 1200 MG CAPS Take 1 capsule by mouth 2 (two) times daily. 1 by mouth daily    [provider]  psyllium (METAMUCIL) 58.6 % packet Take 1 packet by mouth daily.    [provider]  rosuvastatin (CRESTOR) 10 MG tablet Take 10 mg by mouth 2 (two) times a week. Monday and thursday    [provider]  shark liver oil-cocoa butter (PREPARATION H) 0.25-88.44 % suppository Place 1 suppository rectally as needed for hemorrhoids.    [provider]  tamsulosin (FLOMAX) 0.4 MG CAPS capsule Take 1 capsule (0.4 mg total) by mouth daily. Patient taking differently: Take 0.8 mg by mouth daily.  04/30/16   Bruning, Ailene Ards, PA-C    Family History Family History  Problem Relation Age of Onset  . Stroke Father        in 66s  . Colon cancer Father        upper 67's  . Cancer Father        colon  . Heart attack Brother 54       stent  . Cancer Brother        pre cancerous bladder lesion  . Diabetes Paternal Uncle   . Cancer Cousin        prostate  . Cancer Other        bladder  . Arthritis Neg Hx   . Asthma Neg Hx   . COPD Neg Hx   . Rectal cancer Neg Hx   . Stomach cancer Neg Hx     Social History Social History   Tobacco Use  . Smoking status: Former Smoker    Packs/day: 2.00    Years: 3.00    Pack years: 6.00    Types: Cigarettes    Last attempt to quit: 01/12/1970    Years since quitting: 47.3  . Smokeless tobacco: Never Used  Substance Use Topics  . Alcohol use: No    Alcohol/week: 0.0 oz  . Drug use: No     Allergies   Lipitor [atorvastatin]; Morphine and related; Niacin and related; Ramipril; Sulfa antibiotics; and Codeine   Review of Systems Review of Systems  Constitutional: Negative for chills, fatigue and fever.  HENT: Negative for congestion and rhinorrhea.   Eyes: Negative for visual disturbance.    Respiratory: Negative for cough, chest tightness, shortness of breath and wheezing.  Cardiovascular: Negative for chest pain.  Gastrointestinal: Positive for abdominal pain. Negative for abdominal distention, constipation, diarrhea, nausea and vomiting.  Genitourinary: Negative for decreased urine volume, dysuria, enuresis, flank pain, frequency, penile swelling, scrotal swelling and testicular pain.  Musculoskeletal: Negative for back pain, neck pain and neck stiffness.  Skin: Negative for rash and wound.  Neurological: Negative for light-headedness and headaches.  Psychiatric/Behavioral: Negative for agitation.  All other systems reviewed and are negative.    Physical Exam Updated Vital Signs BP 129/80 (BP Location: Right Arm)   Pulse 86   Temp 99.6 F (37.6 C) (Oral)   Resp 16   Ht 6\' 1"  (1.854 m)   Wt 107.5 kg (237 lb)   SpO2 97%   BMI 31.27 kg/m   Physical Exam  Constitutional: He appears well-developed and well-nourished.  Non-toxic appearance. He does not appear ill. No distress.  HENT:  Head: Normocephalic and atraumatic.  Eyes: Pupils are equal, round, and reactive to light. Conjunctivae and EOM are normal. No scleral icterus.  Neck: Neck supple.  Cardiovascular: Normal rate and regular rhythm.  No murmur heard. Pulmonary/Chest: Effort normal and breath sounds normal. No respiratory distress.  Abdominal: Soft. Normal appearance. There is generalized tenderness. There is no rigidity, no rebound, no guarding and no CVA tenderness.  Genitourinary: Rectal exam shows no tenderness.  Musculoskeletal: He exhibits no edema.  Neurological: He is alert.  Skin: Skin is warm and dry.  Psychiatric: He has a normal mood and affect.  Nursing note and vitals reviewed.    ED Treatments / Results  Labs (all labs ordered are listed, but only abnormal results are displayed) Labs Reviewed  COMPREHENSIVE METABOLIC PANEL - Abnormal; Notable for the following components:       Result Value   Glucose, Bld 109 (*)    Creatinine, Ser 1.52 (*)    GFR calc non Af Amer 43 (*)    GFR calc Af Amer 50 (*)    All other components within normal limits  CBC - Abnormal; Notable for the following components:   WBC 12.5 (*)    All other components within normal limits  URINALYSIS, ROUTINE W REFLEX MICROSCOPIC - Abnormal; Notable for the following components:   Color, Urine AMBER (*)    All other components within normal limits  LIPASE, BLOOD  I-STAT CG4 LACTIC ACID, ED  I-STAT TROPONIN, ED  I-STAT CG4 LACTIC ACID, ED    EKG EKG Interpretation  Date/Time:  Friday May 21 2017 22:07:12 EDT Ventricular Rate:  79 PR Interval:    QRS Duration: 102 QT Interval:  388 QTC Calculation: 445 R Axis:   -25 Text Interpretation:  Sinus rhythm Atrial premature complex Prolonged PR interval Borderline left axis deviation Abnormal R-wave progression, early transition When compared to prior, t wave now upright in lead 3.  No STEMI Confirmed by Antony Blackbird [id number]) on 05/22/2017 12:16:05 AM   Radiology Ct Angio Chest/abd/pel For Dissection W And/or Wo Contrast  Result Date: 05/21/2017 CLINICAL DATA:  77 year old male with left lower quadrant abdominal pain. EXAM: CT ANGIOGRAPHY CHEST, ABDOMEN AND PELVIS TECHNIQUE: Multidetector CT imaging through the chest, abdomen and pelvis was performed using the standard protocol during bolus administration of intravenous contrast. Multiplanar reconstructed images and MIPs were obtained and reviewed to evaluate the vascular anatomy. CONTRAST:  162mL ISOVUE-370 IOPAMIDOL (ISOVUE-370) INJECTION 76% COMPARISON:  Chest CT dated 04/28/2017 FINDINGS: CTA CHEST FINDINGS Cardiovascular: There is no cardiomegaly or pericardial effusion. Coronary vascular calcification primarily involving the LAD. Mild  atherosclerotic calcification of the thoracic aorta. No aneurysmal dilatation or evidence of dissection. The origins of the great vessels of the aortic arch are  patent. There is no CT evidence of pulmonary embolism. Mediastinum/Nodes: No hilar or mediastinal adenopathy. There is a small hiatal hernia. The esophagus and the thyroid gland are grossly unremarkable. No mediastinal fluid collection. Lungs/Pleura: There is mild centrilobular emphysema. Multiple bilateral lower lobe subpleural nodules measure up to 7 mm at the left lung base. There is no focal consolidation, pleural effusion, or pneumothorax. The central airways are patent. Musculoskeletal: No chest wall abnormality. No acute or significant osseous findings. Review of the MIP images confirms the above findings. CTA ABDOMEN AND PELVIS FINDINGS VASCULAR Aorta: Mild atherosclerotic disease. There is a 2.5 cm infrarenal aortic ectasia. No aneurysmal dilatation or dissection. Celiac: Patent without evidence of aneurysm, dissection, vasculitis or significant stenosis. There is replaced left hepatic artery left gastric artery anatomy. SMA: Patent without evidence of aneurysm, dissection, vasculitis or significant stenosis. There is a replaced right hepatic artery from the SMA anatomy. Renals: Both renal arteries are patent without evidence of aneurysm, dissection, vasculitis, fibromuscular dysplasia or significant stenosis. IMA: Patent without evidence of aneurysm, dissection, vasculitis or significant stenosis. Inflow: Patent without evidence of aneurysm, dissection, vasculitis or significant stenosis. Veins: No obvious venous abnormality within the limitations of this arterial phase study. Review of the MIP images confirms the above findings. NON-VASCULAR There is no intra-abdominal free air or free fluid. Hepatobiliary: The liver is unremarkable. No intrahepatic biliary ductal dilatation. A 3 mm calcific density along the wall of the gallbladder appears similar to prior CT and may represent a focus of gallbladder wall calcification versus a stone. Additional 3 mm stone may be present in the gallbladder neck. There is  no inflammatory changes of the gallbladder. Pancreas: Unremarkable. No pancreatic ductal dilatation or surrounding inflammatory changes. Spleen: Normal in size without focal abnormality. Adrenals/Urinary Tract: The adrenal glands are unremarkable. Right renal hypodense lesions measure up to 4.5 cm in the upper pole of the right kidney. These lesions are suboptimally characterized on this CT but most likely represent cysts. Smaller lesion in the inferior aspect of the right kidney are too small to characterize. These lesions appears similar to prior CT. There is no hydronephrosis on either side. The visualized ureters and urinary bladder appear unremarkable. Stomach/Bowel: There is sigmoid diverticulosis. There is inflammatory changes of the distal descending/sigmoid junction consistent with acute diverticulitis. No diverticular abscess or perforation. There is no bowel obstruction. The appendix is normal. Lymphatic: No adenopathy. Reproductive: Prostate brachytherapy seeds noted. Other: None Musculoskeletal: No acute or significant osseous findings. Review of the MIP images confirms the above findings. IMPRESSION: 1. No aortic aneurysm or dissection. No CT evidence of pulmonary artery embolism. 2. 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 Ectatic abdominal aorta at risk for aneurysm development. Recommend followup by ultrasound in 5 years. This recommendation follows ACR consensus guidelines: White Paper of the ACR Incidental Findings Committee II on Vascular Findings. J Am Coll Radiol 2013; 10:789-794. 3. Sigmoid diverticulitis.  No diverticular abscess or perforation. 4. Multiple bilateral lower lobe subpleural nodules measuring up to 7 mm at the left lung base. Non-contrast chest CT at 3-6 months is recommended. If the nodules are stable at time of repeat CT, then future CT at 18-24 months (from today's scan) is considered optional for low-risk patients, but is recommended for high-risk patients. This recommendation  follows the consensus statement: Guidelines for Management of Incidental Pulmonary Nodules Detected on  CT Images: From the Fleischner Society 2017; Radiology 2017; 284:228-243. 5. Replaced left hepatic artery from the left gastric artery and right hepatic artery from the SMA variant anatomy. Electronically Signed   By: Anner Crete M.D.   On: 05/21/2017 21:34    Procedures Procedures (including critical care time)  Medications Ordered in ED Medications  iopamidol (ISOVUE-370) 76 % injection (has no administration in time range)  iopamidol (ISOVUE-370) 76 % injection 100 mL (100 mLs Intravenous Contrast Given 05/21/17 2041)  ciprofloxacin (CIPRO) tablet 500 mg (500 mg Oral Given 05/21/17 2238)  metroNIDAZOLE (FLAGYL) tablet 500 mg (500 mg Oral Given 05/21/17 2238)  ondansetron (ZOFRAN) injection 4 mg (4 mg Intravenous Given 05/21/17 2236)  fentaNYL (SUBLIMAZE) injection 50 mcg (50 mcg Intravenous Given 05/21/17 2241)     Initial Impression / Assessment and Plan / ED Course  I have reviewed the triage vital signs and the nursing notes.  Pertinent labs & imaging results that were available during my care of the patient were reviewed by me and considered in my medical decision making (see chart for details).     David Cantrell is a 77 y.o. male with a past medical history significant for prior diverticulitis, prostate cancer, hypertension, hyperlipidemia, COPD, and thoracic AAA who presents with abdominal pain, dysuria, chest pain, shortness of breath.  Patient reports that he was feeling well yesterday but then this morning woke up at 7 AM with severe abdominal pain.  He reports it was initially in his left lower quadrant but it is also in his mid upper abdomen and into his chest.  He reports it is a discomfort and pressure pain.  He reports that as moderate to severe.  He denies nausea or vomiting and had normal bowel movement earlier today.  He reports some dysuria with no hematuria or  frequency.  He denies fevers but does report some chills on arrival here.  He reports some shortness of breath with his chest discomfort.  He does not think it is exertional or pleuritic.  He thinks this feels like prior diverticulitis.  He says he is never had symptoms from his aneurysm.  He denies any trauma.  On exam, abdomen was diffusely tender.  Patient had some tenderness in his lower chest.  Lungs were clear.  Patient had pulses in lower and upper extremities are symmetric.  No CVA tenderness present.  No groin tenderness or tendon tenderness on the testicles.  Based on patient's symptoms I am concerned about diverticulitis however with the pain going towards his chest and his history of AAA we need to further evaluate this.  With a dysuria will order urinalysis to look for pyelonephritis or UTI.  Anticipate reassessment after work-up.  Patient's work-up revealed diverticulitis in the sigmoid colon.  No evidence of infection, perforation, or abscess.  Given patient's similarity to prior suspect this is the cause of his symptoms.  Patient was able to tolerate oral Cipro and Flagyl as well as pain and nausea medicine.  Patient was observed and was able to tolerate eating and drinking.  Given patient's reassuring vital signs and improvement in symptoms, do not feel patient requires admission at this time.  Patient and family understood extremely strict return precautions and follow-up instructions.  Patient had no other worsens or concerns and was discharged in good condition with antibiotics prescription for his diverticulitis.   Final Clinical Impressions(s) / ED Diagnoses   Final diagnoses:  Left lower quadrant pain  Diverticulitis    ED  Discharge Orders        Ordered    ciprofloxacin (CIPRO) 500 MG tablet  Every 12 hours     05/21/17 2332    metroNIDAZOLE (FLAGYL) 500 MG tablet  3 times daily     05/21/17 2332    ondansetron (ZOFRAN) 4 MG tablet  Every 8 hours PRN     05/21/17  2333    oxyCODONE-acetaminophen (PERCOCET/ROXICET) 5-325 MG tablet  Every 4 hours PRN     05/21/17 2333      Clinical Impression: 1. Left lower quadrant pain   2. Diverticulitis     Disposition: Discharge  Condition: Good  I have discussed the results, Dx and Tx plan with the pt(& family if present). He/she/they expressed understanding and agree(s) with the plan. Discharge instructions discussed at great length. Strict return precautions discussed and pt &/or family have verbalized understanding of the instructions. No further questions at time of discharge.    Discharge Medication List as of 05/21/2017 11:34 PM    START taking these medications   Details  ciprofloxacin (CIPRO) 500 MG tablet Take 1 tablet (500 mg total) by mouth every 12 (twelve) hours for 10 days., Starting Fri 05/21/2017, Until Mon 05/31/2017, Print    metroNIDAZOLE (FLAGYL) 500 MG tablet Take 1 tablet (500 mg total) by mouth 3 (three) times daily for 10 days., Starting Fri 05/21/2017, Until Mon 05/31/2017, Print    ondansetron (ZOFRAN) 4 MG tablet Take 1 tablet (4 mg total) by mouth every 8 (eight) hours as needed., Starting Fri 05/21/2017, Print    oxyCODONE-acetaminophen (PERCOCET/ROXICET) 5-325 MG tablet Take 1 tablet by mouth every 4 (four) hours as needed for severe pain., Starting Fri 05/21/2017, Print        Follow Up: [name], MD [place] [address] [phone]     [place] [address] [phone] [place] Pharr       Torri Michalski, Gwenyth Allegra, MD 05/22/17 [phone]

## 2017-05-21 NOTE — Discharge Instructions (Addendum)
Your work-up today showed diverticulitis.  Please take the antibiotics for the next 10 days.  Please use the pain and nausea medicine to help with your symptoms and stay hydrated.  Please help with your primary doctor in several days.  If any symptoms change or worsen, please return to the nearest emergency department.

## 2017-05-21 NOTE — Telephone Encounter (Signed)
Pt has been checked in to [facility] ED 

## 2017-06-08 ENCOUNTER — Ambulatory Visit: Payer: PPO

## 2017-07-14 DIAGNOSIS — H524 Presbyopia: Secondary | ICD-10-CM | POA: Diagnosis not present

## 2017-07-14 DIAGNOSIS — H5203 Hypermetropia, bilateral: Secondary | ICD-10-CM | POA: Diagnosis not present

## 2017-07-14 DIAGNOSIS — H3530 Unspecified macular degeneration: Secondary | ICD-10-CM | POA: Diagnosis not present

## 2017-07-14 DIAGNOSIS — H401111 Primary open-angle glaucoma, right eye, mild stage: Secondary | ICD-10-CM | POA: Diagnosis not present

## 2017-07-14 DIAGNOSIS — H11153 Pinguecula, bilateral: Secondary | ICD-10-CM | POA: Diagnosis not present

## 2017-07-14 DIAGNOSIS — H2513 Age-related nuclear cataract, bilateral: Secondary | ICD-10-CM | POA: Diagnosis not present

## 2017-07-14 DIAGNOSIS — H40012 Open angle with borderline findings, low risk, left eye: Secondary | ICD-10-CM | POA: Diagnosis not present

## 2017-07-14 DIAGNOSIS — H52223 Regular astigmatism, bilateral: Secondary | ICD-10-CM | POA: Diagnosis not present

## 2017-07-14 DIAGNOSIS — H25013 Cortical age-related cataract, bilateral: Secondary | ICD-10-CM | POA: Diagnosis not present

## 2017-07-30 ENCOUNTER — Telehealth: Payer: Self-pay

## 2017-07-30 ENCOUNTER — Other Ambulatory Visit: Payer: Self-pay

## 2017-07-30 MED ORDER — HYOSCYAMINE SULFATE 0.125 MG SL SUBL: 0.1250 mg | SUBLINGUAL_TABLET | SUBLINGUAL | 0 refills | Status: DC | PRN

## 2017-07-30 NOTE — Telephone Encounter (Signed)
Mr. [place] wife being seen in clinic today.  He is requesting a prescription for Levsin.  Per Nevin Bloodgood okay to refill.  Prescription sent electronically.

## 2017-07-30 NOTE — Telephone Encounter (Signed)
Yes, Dr. Sharlett Iles used to give him Levsin SL to keep on hand and it helped. Patient recently treated for diverticulitis confirmed on CT scan. If she has recurrent pain then will make an appt with [place]

## 2017-08-18 DIAGNOSIS — C61 Malignant neoplasm of prostate: Secondary | ICD-10-CM | POA: Diagnosis not present

## 2017-08-25 DIAGNOSIS — N5201 Erectile dysfunction due to arterial insufficiency: Secondary | ICD-10-CM | POA: Diagnosis not present

## 2017-08-25 DIAGNOSIS — C61 Malignant neoplasm of prostate: Secondary | ICD-10-CM | POA: Diagnosis not present

## 2017-09-03 ENCOUNTER — Telehealth: Payer: Self-pay | Admitting: Internal Medicine

## 2017-09-03 ENCOUNTER — Other Ambulatory Visit: Payer: Self-pay | Admitting: Nurse Practitioner

## 2017-09-03 NOTE — Telephone Encounter (Signed)
Dr Hilarie Fredrickson has not seen patient since 2017.Marland KitchenMarland KitchenLooks like Nevin Bloodgood was filling this... Nevin Bloodgood, please advise.Marland KitchenMarland Kitchen

## 2017-09-06 NOTE — Telephone Encounter (Signed)
Dottie, it is okay to give one refill  #30 but he was supposed to make an appt for follow up. Thanks

## 2017-09-07 MED ORDER — HYOSCYAMINE SULFATE 0.125 MG SL SUBL: 0.1250 mg | SUBLINGUAL_TABLET | SUBLINGUAL | 0 refills | Status: DC | PRN

## 2017-09-07 NOTE — Telephone Encounter (Signed)
1 refill sent. Must have office visit for further refills.

## 2017-09-10 IMAGING — DX DG CHEST 2V
2 series · 2 of 2 positions shown · non-contrast
Comparison: CT chest of 10/09/2015 and chest x-ray of 02/16/2011

CLINICAL DATA: Preop for prostate seed implantation, history of
prostate carcinoma, former smoking history

EXAM:
CHEST  2 VIEW

[chest pa]
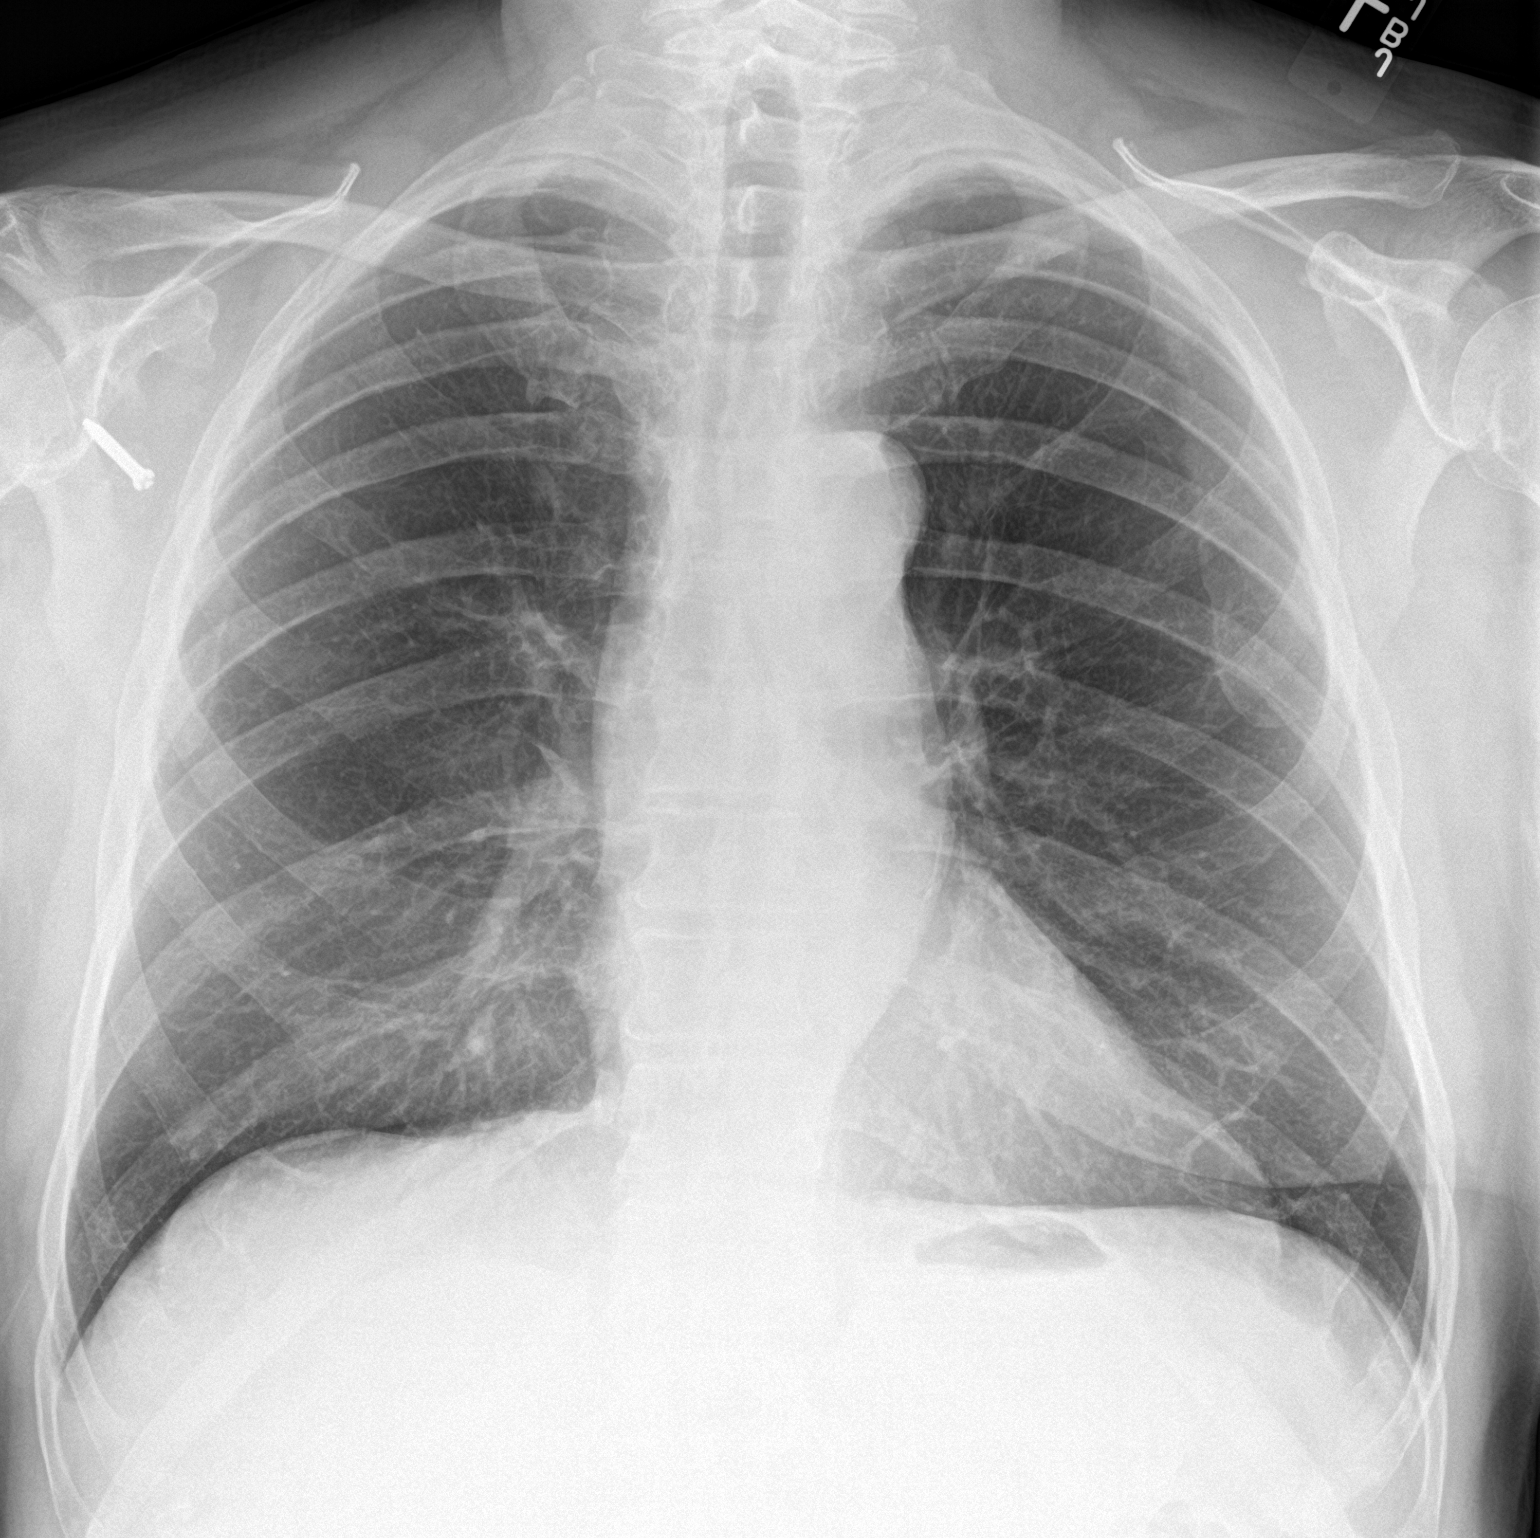

[chest lat]
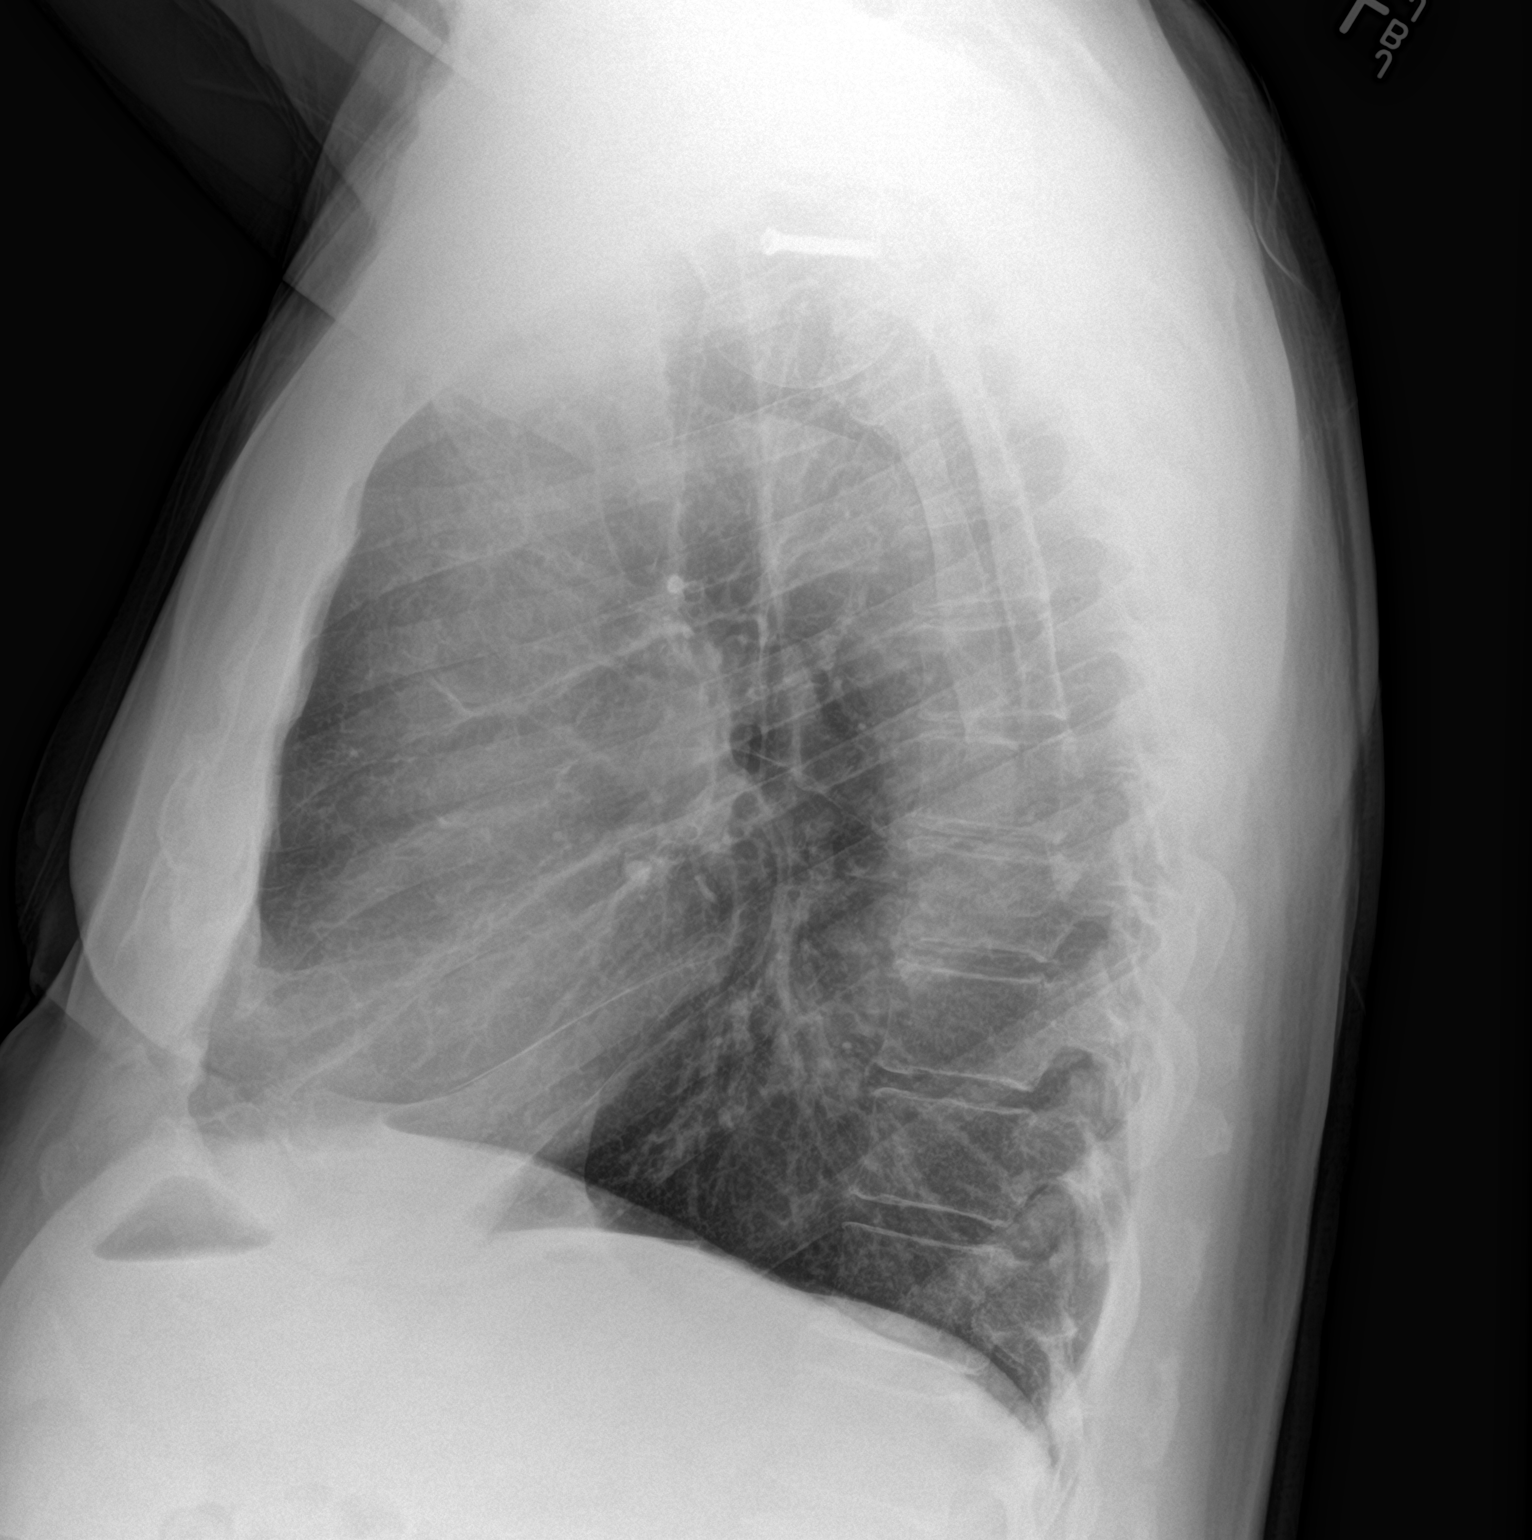

[2 of 2 positions shown; findings below may reference images not displayed]

FINDINGS: No active infiltrate or effusion is seen. Mediastinal and hilar
contours are unremarkable. The heart is within normal limits in
size. The descending thoracic aorta is somewhat ectatic. No acute
bony abnormality is seen. A screw is noted overlying the inferior
glenoid rim.
IMPRESSION: No active cardiopulmonary disease.

## 2017-10-11 ENCOUNTER — Ambulatory Visit (INDEPENDENT_AMBULATORY_CARE_PROVIDER_SITE_OTHER)
Admission: RE | Admit: 2017-10-11 | Discharge: 2017-10-11 | Disposition: A | Discharge status: Home / Self Care | Payer: PPO | Source: Ambulatory Visit | Attending: Internal Medicine | Admitting: Internal Medicine

## 2017-10-11 ENCOUNTER — Encounter: Payer: Self-pay | Admitting: Internal Medicine

## 2017-10-11 ENCOUNTER — Ambulatory Visit (INDEPENDENT_AMBULATORY_CARE_PROVIDER_SITE_OTHER): Payer: PPO | Admitting: Internal Medicine

## 2017-10-11 VITALS — BP 116/64 | HR 67 | Temp 98.6°F | Resp 16 | Ht 73.0 in | Wt 229.0 lb

## 2017-10-11 DIAGNOSIS — M545 Low back pain, unspecified: Secondary | ICD-10-CM

## 2017-10-11 DIAGNOSIS — J0100 Acute maxillary sinusitis, unspecified: Secondary | ICD-10-CM

## 2017-10-11 DIAGNOSIS — R0781 Pleurodynia: Secondary | ICD-10-CM | POA: Diagnosis not present

## 2017-10-11 DIAGNOSIS — R072 Precordial pain: Secondary | ICD-10-CM | POA: Diagnosis not present

## 2017-10-11 DIAGNOSIS — K5792 Diverticulitis of intestine, part unspecified, without perforation or abscess without bleeding: Secondary | ICD-10-CM | POA: Diagnosis not present

## 2017-10-11 MED ORDER — AMOXICILLIN-POT CLAVULANATE 875-125 MG PO TABS: 1.0000 | ORAL_TABLET | Freq: Two times a day (BID) | ORAL | 0 refills | Status: DC

## 2017-10-11 NOTE — Patient Instructions (Addendum)
Have  Chest x-ray today.   We will call you with the results.      Medications reviewed and updated.  Changes include :   Augmentin for your sinus infection and probable diverticulitis.    Your prescription(s) have been submitted to your pharmacy. Please take as directed and contact our office if you believe you are having problem(s) with the medication(s).

## 2017-10-11 NOTE — Assessment & Plan Note (Signed)
Likely bacterial sinus infection possible early bronchitis Start augmentin otc cold/allergy medications Rest, fluid Call if no improvement

## 2017-10-11 NOTE — Assessment & Plan Note (Signed)
Encouraged him to see another orthopedic and sports medicine for another opinion regarding his chronic back pain He will make an appointment with Dr. Tamala Julian

## 2017-10-11 NOTE — Progress Notes (Signed)
Subjective:    Patient ID: David Cantrell, male    DOB: 1940-08-31, 77 y.o.   MRN: [id number]  HPI He is here for an acute visit for cold symptoms.  His cold symptoms started about 2 weeks ago.  He is experiencing low-grade fevers, mild nasal congestion, postnasal drip, sinus pain and pressure, chest tightness, cough is productive at times with green sputum and some intermittent dizziness.  He has shortness of breath, but states that is chronic and has not noticed any significant change.  He denies chills, ear pain, sore throat and wheezing.  His wife is also sick and is on an antibiotic.  He has tried taking mucinex and allegra.  Diverticulitis: He has intermittent flares of his diverticulitis.  In the past day or 2 he has been experiencing increased left lower abdominal pain is consistent with his diverticulitis flares.  He denies any blood in the stool, constipation, diarrhea or nausea.  Chronic back pain: He continues to experience his chronic back pain.  He has seen a few doctors for this in the past and they have told him "nothing is wrong with his back".  He is taking Tylenol as needed.  He continues to have pain near his right shoulder blade and lower back.   Medications and allergies reviewed with patient and updated if appropriate.  Patient Active Problem List   Diagnosis Date Noted  . Lumbar back pain 04/13/2017  . Piriformis syndrome 01/18/2017  . Increased frequency of urination 10/13/2016  . Thoracic aortic aneurysm ([place]) 04/12/2016  . Malignant neoplasm of prostate ([place]) 01/31/2016  . Centrilobular emphysema ([place]) 12/12/2014  . Bronchiectasis without acute exacerbation ([place]) 09/11/2014  . Bulging lumbar disc 10/03/2013  . Open-angle glaucoma 05/05/2013  . 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 Multiple pulmonary nodules 02/24/2013  . Abnormal CT scan, kidney 02/24/2013  . Basal cell carcinoma of ala nasi 02/24/2013  . At risk for obstructive sleep apnea 02/09/2013  .  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 COLONIC POLYPS, HX OF 12/13/2007  . Hyperlipidemia 12/03/2006  . Essential hypertension 12/03/2006  . Coronary atherosclerosis 12/03/2006  . 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 Nevus, non-neoplastic 09/24/2006  . RHINITIS, ALLERGIC NOS 09/24/2006  . Exercise-induced bronchospasm 09/24/2006    Current Outpatient Medications on File Prior to Visit  Medication Sig Dispense Refill  . acetaminophen (TYLENOL) 500 MG tablet Take 1,000 mg by mouth every 6 (six) hours as needed for mild pain.     Marland Kitchen albuterol (PROVENTIL HFA;VENTOLIN HFA) 108 (90 BASE) MCG/ACT inhaler Inhale 2 puffs into the lungs every 6 (six) hours as needed for wheezing or shortness of breath. 1 Inhaler 2  . alfuzosin (UROXATRAL) 10 MG 24 hr tablet Take 1 tablet (10 mg total) by mouth daily before supper. 30 tablet 0  . aspirin EC 81 MG tablet Take 81 mg by mouth every evening.     . brimonidine (ALPHAGAN) 0.15 % ophthalmic solution Place 1 drop into the right eye 2 (two) times daily.     . Coenzyme Q10 (COQ10) 100 MG CAPS Take 1 capsule by mouth daily.    . cyclobenzaprine (FLEXERIL) 5 MG tablet Take 1 tablet (5 mg total) by mouth daily as needed for muscle spasms. 30 tablet 1  . dextromethorphan-guaiFENesin (MUCINEX DM) 30-600 MG 12hr tablet Take 1 tablet by mouth 2 (two) times daily as needed for cough.     . dorzolamide-timolol (COSOPT) 22.3-6.8 MG/ML ophthalmic solution Place 1  drop into the right eye 2 (two) times daily.    . fenofibrate 160 MG tablet Take 160 mg by mouth every morning.     . fexofenadine (ALLEGRA) 180 MG tablet Take 180 mg by mouth daily as needed for allergies or rhinitis.    . Flaxseed, Linseed, 1300 MG CAPS Take by mouth 2 (two) times daily with a meal.    . fluticasone furoate-vilanterol (BREO ELLIPTA) 100-25 MCG/INH AEPB Inhale 1 puff into the lungs daily. (Patient taking differently: Inhale 1 puff into the lungs daily as needed (SOB).  ) 1 each 0  . GARLIC PO Take 1 capsule by mouth daily.    . hydrocortisone ointment 0.5 % Apply 1 application topically 2 (two) times daily as needed for itching.     . hyoscyamine (LEVSIN SL) 0.125 MG SL tablet Place 1 tablet (0.125 mg total) under the tongue as needed. MUST HAVE OFFICE VISIT WITH DR PYRTLE FOR FURTHER REFILLS!!! 20 tablet 0  . methocarbamol (ROBAXIN) 500 MG tablet Take 1 tablet (500 mg total) by mouth 4 (four) times daily. (Patient taking differently: Take 500 mg by mouth 4 (four) times daily as needed for muscle spasms. ) 120 tablet 1  . metoprolol tartrate (LOPRESSOR) 25 MG tablet Take 1 tablet (25 mg total) by mouth 2 (two) times daily. (Patient taking differently: Take 12.5 mg by mouth 2 (two) times daily. ) 180 tablet 1  . Multiple Vitamins-Minerals (MULTIVITAMIN ADULTS PO) Take 1 tablet by mouth daily.     Marland Kitchen NITROSTAT 0.4 MG SL tablet Place 0.4 mg under the tongue every 5 (five) minutes as needed for chest pain.     . Omega-3 Fatty Acids (FISH OIL) 1200 MG CAPS Take 1 capsule by mouth 2 (two) times daily. 1 by mouth daily    . ondansetron (ZOFRAN) 4 MG tablet Take 1 tablet (4 mg total) by mouth every 8 (eight) hours as needed. 12 tablet 0  . oxyCODONE-acetaminophen (PERCOCET/ROXICET) 5-325 MG tablet Take 1 tablet by mouth every 4 (four) hours as needed for severe pain. 10 tablet 0  . psyllium (METAMUCIL) 58.6 % packet Take 1 packet by mouth every morning.     . rosuvastatin (CRESTOR) 10 MG tablet Take 10 mg by mouth 2 (two) times a week. Monday and thursday    . shark liver oil-cocoa butter (PREPARATION H) 0.25-88.44 % suppository Place 1 suppository rectally as needed for hemorrhoids.    . tamsulosin (FLOMAX) 0.4 MG CAPS capsule Take 1 capsule (0.4 mg total) by mouth daily. (Patient taking differently: Take 0.8 mg by mouth daily. ) 30 capsule 2   No current facility-administered medications on file prior to visit.     Past Medical History:  Diagnosis Date  .  Bronchiectasis ([place])    per pulmologist note -- dr Lake Bells-- related to multiple episodes pneumonia as adult  . CAD (coronary artery disease)    CARDIOLOGIST--  DR Wynonia Lawman  . Centrilobular emphysema ([place])   . Congenital renal cyst   . COPD (chronic obstructive pulmonary disease) ([facility])    pulmologist-  dr Lake Bells  . Diverticulosis of colon   . Dyspnea on exertion   . First degree heart block   . Hemorrhoids    CURRENTLY INFLARED DUE TO RADIATION THERAPY  . History of adenomatous polyp of colon    hx multiple tubular adenoma's and hyperplastic polyp's since 2001--- last colonoscopy 05-29-2015  . History of basal cell carcinoma excision    10-31-2012  nose  . History  of external beam radiation therapy 02-01-2016  to 03-16-2016   prostate 45Gy  plus radioactive prostate seed implants 04-07-2016  . History of kidney stones   . Hyperlipidemia   . Hypertension   . Multiple pulmonary nodules    S/P  BILATERAL BRONCHOSCOPY  09-24-2014  no infection --    . Open-angle glaucoma    RIGHT EYE ONLY/OPEN ANGLE  . Prostate cancer [facility]) urologist-  dr eskridge/  oncologist-  dr Tammi Klippel   Stage T1c,  Gleason3+4, PSA 4.7--  s/p  external beam radiation therapy 02-01-2016 to 03-16-2016  . Seasonal allergies   . Thoracic aortic aneurysm ([place])    monitored by dr [name] (vascular surgeon)--- 10-09-2015 last CT 4.2cm, stable  . Wears dentures    full upper and lower parital    Past Surgical History:  Procedure Laterality Date  . CARDIAC CATHETERIZATION  09-04-2002  DR Wynonia Lawman   NORMAL LVF/  MILD TO MODERATE CAD INVOLVING PROXIMAL 40%  AND MID 40%  LAD/  POSTERIOR DESCENDING CX 70%  . CARDIAC CATHETERIZATION  06-05-2010  DR Fidela Juneau   NORMAL LM/  40% PROXIMAL MID LAD/ 70% DISTAL CFX/  80% PROXIMAL PDA/  SMALL & NONDOMINANT RCA  . CARDIOVASCULAR STRESS TEST  06-01-2013   dr Wynonia Lawman   normal lexiscan myoview w/ no ischemia/ normal LV function and wall motion, ef 61%  . COLONOSCOPY W/ POLYPECTOMY  last one  05-29-2015  . CYSTOSCOPY N/A 04/07/2016   Procedure: CYSTOSCOPY FLEXIBLE;  Surgeon: Festus Aloe, MD;  Location: [facility];  Service: Urology;  Laterality: N/A;  no seeds found in bladder  . CYSTOSCOPY WITH RETROGRADE PYELOGRAM, URETEROSCOPY AND STENT PLACEMENT Right 02/02/2013   Procedure: CYSTOSCOPY WITH RETROGRADE PYELOGRAM, AND Right STENT PLACEMENT;  Surgeon: Molli Hazard, MD;  Location: [facility];  Service: Urology;  Laterality: Right;  . CYSTOSCOPY WITH RETROGRADE PYELOGRAM, URETEROSCOPY AND STENT PLACEMENT Right 02/15/2013   Procedure: CYSTOSCOPY WITH RETROGRADE PYELOGRAM, URETEROSCOPY AND STENT EXCHANGE;  Surgeon: Molli Hazard, MD;  Location: [facility];  Service: Urology;  Laterality: Right;  . ESI     ; for cervical radiculopathy  . HEMORRHOID SURGERY  07-03-1999  . HOLMIUM LASER APPLICATION Right 06/13/8313   Procedure: HOLMIUM LASER APPLICATION;  Surgeon: Molli Hazard, MD;  Location: [facility];  Service: Urology;  Laterality: Right;  . PROSTATE BIOPSY    . RADIOACTIVE SEED IMPLANT N/A 04/07/2016   Procedure: RADIOACTIVE SEED IMPLANT/BRACHYTHERAPY IMPLANT;  Surgeon: Festus Aloe, MD;  Location: [facility];  Service: Urology;  Laterality: N/A;  55 seeds implanted  . SHOULDER OPEN ROTATOR CUFF REPAIR Bilateral RIGHT X2  LAST ONE 1997/  LEFT 2005   right inferior glenoid screw retained  . VIDEO BRONCHOSCOPY Bilateral 09/24/2014   Procedure: VIDEO BRONCHOSCOPY WITH FLUORO;  Surgeon: Juanito Doom, MD;  Location: [place];  Service: Cardiopulmonary;  Laterality: Bilateral;    Social History   Socioeconomic History  . Marital status: Married    Spouse name: Not on file  . Number of children: Not on file  . Years of education: Not on file  . Highest education level: Not on file  Occupational History  . Not on file  Social Needs  . Financial resource strain: Not on file  . Food  insecurity:    Worry: Not on file    Inability: Not on file  . Transportation needs:    Medical: Not on file    Non-medical: Not on  file  Tobacco Use  . Smoking status: Former Smoker    Packs/day: 2.00    Years: 3.00    Pack years: 6.00    Types: Cigarettes    Last attempt to quit: 01/12/1970    Years since quitting: 47.7  . Smokeless tobacco: Never Used  Substance and Sexual Activity  . Alcohol use: No    Alcohol/week: 0.0 standard drinks  . Drug use: No  . Sexual activity: Not on file  Lifestyle  . Physical activity:    Days per week: Not on file    Minutes per session: Not on file  . Stress: Not on file  Relationships  . Social connections:    Talks on phone: Not on file    Gets together: Not on file    Attends religious service: Not on file    Active member of club or organization: Not on file    Attends meetings of clubs or organizations: Not on file    Relationship status: Not on file  Other Topics Concern  . Not on file  Social History Narrative  . Not on file    Family History  Problem Relation Age of Onset  . Stroke Father        in 7s  . Colon cancer Father        upper 44's  . Cancer Father        colon  . Heart attack Brother 13       stent  . Cancer Brother        pre cancerous bladder lesion  . Diabetes Paternal Uncle   . Cancer Cousin        prostate  . Cancer Other        bladder  . Arthritis Neg Hx   . Asthma Neg Hx   . COPD Neg Hx   . Rectal cancer Neg Hx   . Stomach cancer Neg Hx     Review of Systems  Constitutional: Positive for fever (low grade). Negative for chills.  HENT: Positive for congestion (mild), postnasal drip, sinus pressure and sinus pain. Negative for ear pain (pressure, clogged) and sore throat.   Respiratory: Positive for cough (prodcutive of green sputum), chest tightness and shortness of breath (chronic - no change). Negative for wheezing.   Gastrointestinal: Positive for abdominal pain. Negative for blood in  stool, constipation, diarrhea and nausea.  Neurological: Positive for dizziness.       Objective:   Vitals:   10/11/17 1016  BP: 116/64  Pulse: 67  Resp: 16  Temp: 98.6 F (37 C)  SpO2: 96%   Filed Weights   10/11/17 1016  Weight: 229 lb (103.9 kg)   Body mass index is 30.21 kg/m.  Wt Readings from Last 3 Encounters:  10/11/17 229 lb (103.9 kg)  05/21/17 237 lb (107.5 kg)  04/28/17 235 lb 3.2 oz (106.7 kg)     Physical Exam GENERAL APPEARANCE: Appears stated age, well appearing, NAD EYES: conjunctiva clear, no icterus HEENT: bilateral tympanic membranes and ear canals normal, oropharynx with mild erythema, no thyromegaly, trachea midline, no cervical or supraclavicular lymphadenopathy LUNGS/CHEST: Tenderness right anterior lower rib-possibly rib 10 where it meets the sternum-there is a sister osteophyte there that is tender, clear to auscultation without wheeze or crackles, unlabored breathing, good air entry bilaterally CARDIOVASCULAR: Normal S1,S2 without murmurs, no edema ABD: Soft, nondistended, tenderness in left lower quadrant with palpation, no tenderness elsewhere, no rebound or guarding, no mass SKIN: warm, dry  Assessment & Plan:   See Problem List for Assessment and Plan of chronic medical problems.

## 2017-10-11 NOTE — Assessment & Plan Note (Signed)
He has tenderness in the morning of his right lower ribs where it meets the sternum, there is also a cyst or possible osteophyte at this area We will check good and chest x-ray

## 2017-10-11 NOTE — Assessment & Plan Note (Signed)
History of diverticulitis and possible flare Has left lower quadrant pain that is similar to the pain he has had a previous diverticulitis flares We will start Augmentin for his sinus infection, which should also cover him for diverticulitis Call if no improvement

## 2017-10-19 NOTE — Progress Notes (Deleted)
[facility] [place],  [address] Phone: [phone] Subjective:    I'm seeing this patient by the request  of:    CC:   [id number]  David Cantrell is a 77 y.o. male coming in with complaint of ***  Onset-  Location Duration-  Character- Aggravating factors- Reliving factors-  Therapies tried-  Severity-     Past Medical History:  Diagnosis Date  . Bronchiectasis ([place])    per pulmologist note -- dr Lake Bells-- related to multiple episodes pneumonia as adult  . CAD (coronary artery disease)    CARDIOLOGIST--  DR Wynonia Lawman  . Centrilobular emphysema ([place])   . Congenital renal cyst   . COPD (chronic obstructive pulmonary disease) ([facility])    pulmologist-  dr Lake Bells  . Diverticulosis of colon   . Dyspnea on exertion   . First degree heart block   . Hemorrhoids    CURRENTLY INFLARED DUE TO RADIATION THERAPY  . History of adenomatous polyp of colon    hx multiple tubular adenoma's and hyperplastic polyp's since 2001--- last colonoscopy 05-29-2015  . History of basal cell carcinoma excision    10-31-2012  nose  . History of external beam radiation therapy 02-01-2016  to 03-16-2016   prostate 45Gy  plus radioactive prostate seed implants 04-07-2016  . History of kidney stones   . Hyperlipidemia   . Hypertension   . Multiple pulmonary nodules    S/P  BILATERAL BRONCHOSCOPY  09-24-2014  no infection --    . Open-angle glaucoma    RIGHT EYE ONLY/OPEN ANGLE  . Prostate cancer [facility]) urologist-  dr eskridge/  oncologist-  dr Tammi Klippel   Stage T1c,  Gleason3+4, PSA 4.7--  s/p  external beam radiation therapy 02-01-2016 to 03-16-2016  . Seasonal allergies   . Thoracic aortic aneurysm ([place])    monitored by dr [name] (vascular surgeon)--- 10-09-2015 last CT 4.2cm, stable  . Wears dentures    full upper and lower parital   Past Surgical History:  Procedure Laterality Date  . CARDIAC CATHETERIZATION  09-04-2002  DR Wynonia Lawman   NORMAL  LVF/  MILD TO MODERATE CAD INVOLVING PROXIMAL 40%  AND MID 40%  LAD/  POSTERIOR DESCENDING CX 70%  . CARDIAC CATHETERIZATION  06-05-2010  DR Fidela Juneau   NORMAL LM/  40% PROXIMAL MID LAD/ 70% DISTAL CFX/  80% PROXIMAL PDA/  SMALL & NONDOMINANT RCA  . CARDIOVASCULAR STRESS TEST  06-01-2013   dr Wynonia Lawman   normal lexiscan myoview w/ no ischemia/ normal LV function and wall motion, ef 61%  . COLONOSCOPY W/ POLYPECTOMY  last one 05-29-2015  . CYSTOSCOPY N/A 04/07/2016   Procedure: CYSTOSCOPY FLEXIBLE;  Surgeon: Festus Aloe, MD;  Location: [facility];  Service: Urology;  Laterality: N/A;  no seeds found in bladder  . CYSTOSCOPY WITH RETROGRADE PYELOGRAM, URETEROSCOPY AND STENT PLACEMENT Right 02/02/2013   Procedure: CYSTOSCOPY WITH RETROGRADE PYELOGRAM, AND Right STENT PLACEMENT;  Surgeon: Molli Hazard, MD;  Location: [facility];  Service: Urology;  Laterality: Right;  . CYSTOSCOPY WITH RETROGRADE PYELOGRAM, URETEROSCOPY AND STENT PLACEMENT Right 02/15/2013   Procedure: CYSTOSCOPY WITH RETROGRADE PYELOGRAM, URETEROSCOPY AND STENT EXCHANGE;  Surgeon: Molli Hazard, MD;  Location: [facility];  Service: Urology;  Laterality: Right;  . ESI     ; for cervical radiculopathy  . HEMORRHOID SURGERY  07-03-1999  . HOLMIUM LASER APPLICATION Right 7/0/2637   Procedure: HOLMIUM LASER APPLICATION;  Surgeon: Molli Hazard, MD;  Location: [facility];  Service: Urology;  Laterality: Right;  . PROSTATE BIOPSY    . RADIOACTIVE SEED IMPLANT N/A 04/07/2016   Procedure: RADIOACTIVE SEED IMPLANT/BRACHYTHERAPY IMPLANT;  Surgeon: Festus Aloe, MD;  Location[facility];  Service: Urology;  Laterality: N/A;  55 seeds implanted  . SHOULDER OPEN ROTATOR CUFF REPAIR Bilateral RIGHT X2  LAST ONE 1997/  LEFT 2005   right inferior glenoid screw retained  . VIDEO BRONCHOSCOPY Bilateral 09/24/2014   Procedure: VIDEO BRONCHOSCOPY WITH FLUORO;  Surgeon:  Juanito Doom, MD;  Location: [facility];  Service: Cardiopulmonary;  Laterality: Bilateral;   Social History   Socioeconomic History  . Marital status: Married    Spouse name: Not on file  . Number of children: Not on file  . Years of education: Not on file  . Highest education level: Not on file  Occupational History  . Not on file  Social Needs  . Financial resource strain: Not on file  . Food insecurity:    Worry: Not on file    Inability: Not on file  . Transportation needs:    Medical: Not on file    Non-medical: Not on file  Tobacco Use  . Smoking status: Former Smoker    Packs/day: 2.00    Years: 3.00    Pack years: 6.00    Types: Cigarettes    Last attempt to quit: 01/12/1970    Years since quitting: 47.8  . Smokeless tobacco: Never Used  Substance and Sexual Activity  . Alcohol use: No    Alcohol/week: 0.0 standard drinks  . Drug use: No  . Sexual activity: Not on file  Lifestyle  . Physical activity:    Days per week: Not on file    Minutes per session: Not on file  . Stress: Not on file  Relationships  . Social connections:    Talks on phone: Not on file    Gets together: Not on file    Attends religious service: Not on file    Active member of club or organization: Not on file    Attends meetings of clubs or organizations: Not on file    Relationship status: Not on file  Other Topics Concern  . Not on file  Social History Narrative  . Not on file   Allergies  Allergen Reactions  . Lipitor [Atorvastatin] Other (See Comments)    MUSCLE ACHES  . Morphine And Related Other (See Comments)    Severe headache  . Niacin And Related Other (See Comments)    FLUSHING  . Ramipril Other (See Comments)    COUGH  . Sulfa Antibiotics Hives  . Codeine Other (See Comments)    CONSTIPATION   Family History  Problem Relation Age of Onset  . Stroke Father        in 38s  . Colon cancer Father        upper 14's  . Cancer Father        colon  . Heart  attack Brother 31       stent  . Cancer Brother        pre cancerous bladder lesion  . Diabetes Paternal Uncle   . Cancer Cousin        prostate  . Cancer Other        bladder  . Arthritis Neg Hx   . Asthma Neg Hx   . COPD Neg Hx   . Rectal cancer Neg Hx   .  Stomach cancer Neg Hx      Current Outpatient Medications (Cardiovascular):  .  fenofibrate 160 MG tablet, Take 160 mg by mouth every morning.  .  metoprolol tartrate (LOPRESSOR) 25 MG tablet, Take 1 tablet (25 mg total) by mouth 2 (two) times daily. (Patient taking differently: Take 12.5 mg by mouth 2 (two) times daily. ) .  NITROSTAT 0.4 MG SL tablet, Place 0.4 mg under the tongue every 5 (five) minutes as needed for chest pain.  .  rosuvastatin (CRESTOR) 10 MG tablet, Take 10 mg by mouth 2 (two) times a week. Monday and thursday  Current Outpatient Medications (Respiratory):  .  albuterol (PROVENTIL HFA;VENTOLIN HFA) 108 (90 BASE) MCG/ACT inhaler, Inhale 2 puffs into the lungs every 6 (six) hours as needed for wheezing or shortness of breath. .  dextromethorphan-guaiFENesin (MUCINEX DM) 30-600 MG 12hr tablet, Take 1 tablet by mouth 2 (two) times daily as needed for cough.  .  fexofenadine (ALLEGRA) 180 MG tablet, Take 180 mg by mouth daily as needed for allergies or rhinitis. .  fluticasone furoate-vilanterol (BREO ELLIPTA) 100-25 MCG/INH AEPB, Inhale 1 puff into the lungs daily. (Patient taking differently: Inhale 1 puff into the lungs daily as needed (SOB). )  Current Outpatient Medications (Analgesics):  .  acetaminophen (TYLENOL) 500 MG tablet, Take 1,000 mg by mouth every 6 (six) hours as needed for mild pain.  Marland Kitchen  aspirin EC 81 MG tablet, Take 81 mg by mouth every evening.    Current Outpatient Medications (Other):  .  alfuzosin (UROXATRAL) 10 MG 24 hr tablet, Take 1 tablet (10 mg total) by mouth daily before supper. Marland Kitchen  amoxicillin-clavulanate (AUGMENTIN) 875-125 MG tablet, Take 1 tablet by mouth 2 (two) times  daily. .  brimonidine (ALPHAGAN) 0.15 % ophthalmic solution, Place 1 drop into the right eye 2 (two) times daily.  .  Coenzyme Q10 (COQ10) 100 MG CAPS, Take 1 capsule by mouth daily. .  cyclobenzaprine (FLEXERIL) 5 MG tablet, Take 1 tablet (5 mg total) by mouth daily as needed for muscle spasms. .  dorzolamide-timolol (COSOPT) 22.3-6.8 MG/ML ophthalmic solution, Place 1 drop into the right eye 2 (two) times daily. .  Flaxseed, Linseed, 1300 MG CAPS, Take by mouth 2 (two) times daily with a meal. .  GARLIC PO, Take 1 capsule by mouth daily. .  hydrocortisone ointment 0.5 %, Apply 1 application topically 2 (two) times daily as needed for itching.  .  hyoscyamine (LEVSIN SL) 0.125 MG SL tablet, Place 1 tablet (0.125 mg total) under the tongue as needed. MUST HAVE OFFICE VISIT WITH DR PYRTLE FOR FURTHER REFILLS!!! .  methocarbamol (ROBAXIN) 500 MG tablet, Take 1 tablet (500 mg total) by mouth 4 (four) times daily. (Patient taking differently: Take 500 mg by mouth 4 (four) times daily as needed for muscle spasms. ) .  Multiple Vitamins-Minerals (MULTIVITAMIN ADULTS PO), Take 1 tablet by mouth daily.  .  Omega-3 Fatty Acids (FISH OIL) 1200 MG CAPS, Take 1 capsule by mouth 2 (two) times daily. 1 by mouth daily .  ondansetron (ZOFRAN) 4 MG tablet, Take 1 tablet (4 mg total) by mouth every 8 (eight) hours as needed. .  psyllium (METAMUCIL) 58.6 % packet, Take 1 packet by mouth every morning.  .  shark liver oil-cocoa butter (PREPARATION H) 0.25-88.44 % suppository, Place 1 suppository rectally as needed for hemorrhoids. .  tamsulosin (FLOMAX) 0.4 MG CAPS capsule, Take 1 capsule (0.4 mg total) by mouth daily. (Patient taking differently: Take 0.8 mg  by mouth daily. )    Past medical history, social, surgical and family history all reviewed in electronic medical record.  No pertanent information unless stated regarding to the chief complaint.   Review of Systems:  No headache, visual changes, nausea,  vomiting, diarrhea, constipation, dizziness, abdominal pain, skin rash, fevers, chills, night sweats, weight loss, swollen lymph nodes, body aches, joint swelling, muscle aches, chest pain, shortness of breath, mood changes.   Objective  There were no vitals taken for this visit. Systems examined below as of    General: No apparent distress alert and oriented x3 mood and affect normal, dressed appropriately.  HEENT: Pupils equal, extraocular movements intact  Respiratory: Patient's speak in full sentences and does not appear short of breath  Cardiovascular: No lower extremity edema, non tender, no erythema  Skin: Warm dry intact with no signs of infection or rash on extremities or on axial skeleton.  Abdomen: Soft nontender  Neuro: Cranial nerves II through XII are intact, neurovascularly intact in all extremities with 2+ DTRs and 2+ pulses.  Lymph: No lymphadenopathy of posterior or anterior cervical chain or axillae bilaterally.  Gait normal with good balance and coordination.  MSK:  Non tender with full range of motion and good stability and symmetric strength and tone of shoulders, elbows, wrist, hip, knee and ankles bilaterally.     Impression and Recommendations:     This case required medical decision making of moderate complexity. The above documentation has been reviewed and is accurate and complete Lyndal Pulley, DO       Note: This dictation was prepared with Dragon dictation along with smaller phrase technology. Any transcriptional errors that result from this process are unintentional.

## 2017-10-20 ENCOUNTER — Telehealth: Payer: Self-pay | Admitting: Internal Medicine

## 2017-10-20 NOTE — Telephone Encounter (Signed)
Pt aware.

## 2017-10-20 NOTE — Telephone Encounter (Signed)
Spoke with pt and he stated he is still having a productive green mucus cough, the feeling of being hot and then cold, some SOB, some crackling and popping in chest, dizziness. Feels worse than when he came in for his visit. Please advise on next steps

## 2017-10-20 NOTE — Telephone Encounter (Signed)
Can take doxycycline  -- twice daily for 10 days.

## 2017-10-20 NOTE — Telephone Encounter (Signed)
Copied from [place] [id number]. Topic: Quick Communication - See Telephone Encounter >> Oct 20, 2017  8:17 AM Conception Chancy, NT wrote: CRM for notification. See Telephone encounter for: 10/20/17.  Patient wife is calling and states her husband is not better from his sinus infection. She states he has 1 pill left of amoxicillin-clavulanate (AUGMENTIN) 875-125 MG tablet and would like for something else to be called in. She states that she was put on Doxycycline 100mg  but she could not take that and states that if Dr. Quay Burow wants him to take that she has plenty of those. She then took z-pack and she is better now. Would like for nurse Lovena Le to give her a call.  [place], [place] - [address] [place] [address] Phone: [phone] Fax: [phone]

## 2017-10-22 ENCOUNTER — Ambulatory Visit: Payer: PPO | Admitting: Family Medicine

## 2017-10-27 ENCOUNTER — Telehealth: Payer: Self-pay | Admitting: Internal Medicine

## 2017-10-27 MED ORDER — DOXYCYCLINE HYCLATE 100 MG PO TABS: 100.0000 mg | ORAL_TABLET | Freq: Two times a day (BID) | ORAL | 0 refills | Status: DC

## 2017-10-27 NOTE — Telephone Encounter (Signed)
Will route to office for final disposition; also see CRM # [id number].

## 2017-10-27 NOTE — Telephone Encounter (Signed)
LVM letting pt know that the rest of the rx has been sent in to pharmacy.

## 2017-10-27 NOTE — Telephone Encounter (Signed)
Copied from [facility] [id number]. Topic: Quick Communication - See Telephone Encounter >> Oct 27, 2017  8:24 AM Hewitt Shorts wrote: Pt wife called stating that she was not able to take doxycycline but her husband was and Dr. Quay Burow told them it was ok for him to take her meds but she had taken 4 and he is needing those 4 do a refill  For only 4 is being requested   [facility] [place]   Best number [phone]

## 2017-10-29 ENCOUNTER — Telehealth: Payer: Self-pay | Admitting: Cardiology

## 2017-10-29 MED ORDER — ROSUVASTATIN CALCIUM 10 MG PO TABS: 10.0000 mg | ORAL_TABLET | ORAL | 0 refills | Status: DC

## 2017-10-29 NOTE — Telephone Encounter (Signed)
Contacted patient to confirm that he takes rosuvastatin (crestor) 10 mg twice weekly on Mondays and Thursdays. Patient is due for follow up in January 2020. Refill sent to [facility] in [place] [address]. No further questions.

## 2017-10-29 NOTE — Telephone Encounter (Signed)
° ° °  1. Which medications need to be refilled? (please list name of each medication and dose if known) Rosuvastatin 10mg   2. Which pharmacy/location (including street and city if local pharmacy) is medication to be sent to? [facility] [place]  3. Do they need a 30 day or 90 day supply? [place]

## 2017-11-04 NOTE — Progress Notes (Signed)
Subjective:    Patient ID: David Cantrell, male    DOB: 1941-01-01, 77 y.o.   MRN: [id number]  HPI He is here for an acute visit for cold symptoms.  He was initially seen by me 9/30 for cold symptoms and I diagnosed him with a sinus infection, possible early bronchitis.  When he was first seen he also had signs of early diverticulitis.  He was started on Augmentin, which he completed.  The diverticulitis improved, but his sinus symptoms did not.  He was not feeling better and was experiencing a productive cough with green sputum.  He took 10 days of doxycycline.  He is still experiencing nasal congestion and is here today for reevaluation.  His cough symptoms did improve, but he still has a mild cough.  His current symptoms include sweating at night, nasal congestion, significant sinus pain and headaches, dizziness, postnasal drip, worsening of his chronic tinnitus, cough and shortness of breath.  He has some mild wheeze on occasion.  He denies any fever or chills.  Tylenol and mucinex help a little.   Medications and allergies reviewed with patient and updated if appropriate.  Patient Active Problem List   Diagnosis Date Noted  . Rib pain on right side 10/11/2017  . Diverticulitis 10/11/2017  . Lumbar back pain 04/13/2017  . Piriformis syndrome 01/18/2017  . Increased frequency of urination 10/13/2016  . Thoracic aortic aneurysm ([place]) 04/12/2016  . Malignant neoplasm of prostate ([place]) 01/31/2016  . Subacute maxillary sinusitis 06/19/2015  . Centrilobular emphysema ([place]) 12/12/2014  . Bronchiectasis without acute exacerbation ([place]) 09/11/2014  . Bulging lumbar disc 10/03/2013  . Open-angle glaucoma 05/05/2013  . 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 Multiple pulmonary nodules 02/24/2013  . Abnormal CT scan, kidney 02/24/2013  . Basal cell carcinoma of ala nasi 02/24/2013  . At risk for obstructive sleep apnea 02/09/2013  . 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 COLONIC  POLYPS, HX OF 12/13/2007  . Hyperlipidemia 12/03/2006  . Essential hypertension 12/03/2006  . Coronary atherosclerosis 12/03/2006  . 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 Nevus, non-neoplastic 09/24/2006  . RHINITIS, ALLERGIC NOS 09/24/2006  . Exercise-induced bronchospasm 09/24/2006    Current Outpatient Medications on File Prior to Visit  Medication Sig Dispense Refill  . acetaminophen (TYLENOL) 500 MG tablet Take 1,000 mg by mouth every 6 (six) hours as needed for mild pain.     Marland Kitchen albuterol (PROVENTIL HFA;VENTOLIN HFA) 108 (90 BASE) MCG/ACT inhaler Inhale 2 puffs into the lungs every 6 (six) hours as needed for wheezing or shortness of breath. 1 Inhaler 2  . alfuzosin (UROXATRAL) 10 MG 24 hr tablet Take 1 tablet (10 mg total) by mouth daily before supper. 30 tablet 0  . aspirin EC 81 MG tablet Take 81 mg by mouth every evening.     . brimonidine (ALPHAGAN) 0.15 % ophthalmic solution Place 1 drop into the right eye 2 (two) times daily.     . Coenzyme Q10 (COQ10) 100 MG CAPS Take 1 capsule by mouth daily.    . cyclobenzaprine (FLEXERIL) 5 MG tablet Take 1 tablet (5 mg total) by mouth daily as needed for muscle spasms. 30 tablet 1  . dextromethorphan-guaiFENesin (MUCINEX DM) 30-600 MG 12hr tablet Take 1 tablet by mouth 2 (two) times daily as needed for cough.     . dorzolamide-timolol (COSOPT) 22.3-6.8 MG/ML ophthalmic solution Place 1 drop into the right eye 2 (two) times daily.    . fenofibrate  160 MG tablet Take 160 mg by mouth every morning.     . fexofenadine (ALLEGRA) 180 MG tablet Take 180 mg by mouth daily as needed for allergies or rhinitis.    . Flaxseed, Linseed, 1300 MG CAPS Take by mouth 2 (two) times daily with a meal.    . fluticasone furoate-vilanterol (BREO ELLIPTA) 100-25 MCG/INH AEPB Inhale 1 puff into the lungs daily. (Patient taking differently: Inhale 1 puff into the lungs daily as needed (SOB). ) 1 each 0  . GARLIC PO Take 1 capsule by mouth  daily.    . hydrocortisone ointment 0.5 % Apply 1 application topically 2 (two) times daily as needed for itching.     . hyoscyamine (LEVSIN SL) 0.125 MG SL tablet Place 1 tablet (0.125 mg total) under the tongue as needed. MUST HAVE OFFICE VISIT WITH DR PYRTLE FOR FURTHER REFILLS!!! 20 tablet 0  . methocarbamol (ROBAXIN) 500 MG tablet Take 1 tablet (500 mg total) by mouth 4 (four) times daily. (Patient taking differently: Take 500 mg by mouth 4 (four) times daily as needed for muscle spasms. ) 120 tablet 1  . metoprolol tartrate (LOPRESSOR) 25 MG tablet Take 1 tablet (25 mg total) by mouth 2 (two) times daily. (Patient taking differently: Take 12.5 mg by mouth 2 (two) times daily. ) 180 tablet 1  . Multiple Vitamins-Minerals (MULTIVITAMIN ADULTS PO) Take 1 tablet by mouth daily.     Marland Kitchen NITROSTAT 0.4 MG SL tablet Place 0.4 mg under the tongue every 5 (five) minutes as needed for chest pain.     . Omega-3 Fatty Acids (FISH OIL) 1200 MG CAPS Take 1 capsule by mouth 2 (two) times daily. 1 by mouth daily    . ondansetron (ZOFRAN) 4 MG tablet Take 1 tablet (4 mg total) by mouth every 8 (eight) hours as needed. 12 tablet 0  . psyllium (METAMUCIL) 58.6 % packet Take 1 packet by mouth every morning.     . rosuvastatin (CRESTOR) 10 MG tablet Take 1 tablet (10 mg total) by mouth 2 (two) times a week. Monday and thursday 30 tablet 0  . shark liver oil-cocoa butter (PREPARATION H) 0.25-88.44 % suppository Place 1 suppository rectally as needed for hemorrhoids.    . tamsulosin (FLOMAX) 0.4 MG CAPS capsule Take 1 capsule (0.4 mg total) by mouth daily. (Patient taking differently: Take 0.8 mg by mouth daily. ) 30 capsule 2   No current facility-administered medications on file prior to visit.     Past Medical History:  Diagnosis Date  . Bronchiectasis ([place])    per pulmologist note -- dr [place] Bells-- related to multiple episodes pneumonia as adult  . CAD (coronary artery disease)    CARDIOLOGIST--  DR Wynonia Lawman  .  Centrilobular emphysema ([place])   . Congenital renal cyst   . COPD (chronic obstructive pulmonary disease) ([facility])    pulmologist-  dr [place] Bells  . Diverticulosis of colon   . Dyspnea on exertion   . First degree heart block   . Hemorrhoids    CURRENTLY INFLARED DUE TO RADIATION THERAPY  . History of adenomatous polyp of colon    hx multiple tubular adenoma's and hyperplastic polyp's since 2001--- last colonoscopy 05-29-2015  . History of basal cell carcinoma excision    10-31-2012  nose  . History of external beam radiation therapy 02-01-2016  to 03-16-2016   prostate 45Gy  plus radioactive prostate seed implants 04-07-2016  . History of kidney stones   . Hyperlipidemia   . Hypertension   .  Multiple pulmonary nodules    S/P  BILATERAL BRONCHOSCOPY  09-24-2014  no infection --    . Open-angle glaucoma    RIGHT EYE ONLY/OPEN ANGLE  . Prostate cancer [facility]) urologist-  dr eskridge/  oncologist-  dr Tammi Klippel   Stage T1c,  Gleason3+4, PSA 4.7--  s/p  external beam radiation therapy 02-01-2016 to 03-16-2016  . Seasonal allergies   . Thoracic aortic aneurysm ([place])    monitored by dr [name] (vascular surgeon)--- 10-09-2015 last CT 4.2cm, stable  . Wears dentures    full upper and lower parital    Past Surgical History:  Procedure Laterality Date  . CARDIAC CATHETERIZATION  09-04-2002  DR Wynonia Lawman   NORMAL LVF/  MILD TO MODERATE CAD INVOLVING PROXIMAL 40%  AND MID 40%  LAD/  POSTERIOR DESCENDING CX 70%  . CARDIAC CATHETERIZATION  06-05-2010  DR Fidela Juneau   NORMAL LM/  40% PROXIMAL MID LAD/ 70% DISTAL CFX/  80% PROXIMAL PDA/  SMALL & NONDOMINANT RCA  . CARDIOVASCULAR STRESS TEST  06-01-2013   dr Wynonia Lawman   normal lexiscan myoview w/ no ischemia/ normal LV function and wall motion, ef 61%  . COLONOSCOPY W/ POLYPECTOMY  last one 05-29-2015  . CYSTOSCOPY N/A 04/07/2016   Procedure: CYSTOSCOPY FLEXIBLE;  Surgeon: Festus Aloe, MD;  Location: [facility];  Service: Urology;  Laterality:  N/A;  no seeds found in bladder  . CYSTOSCOPY WITH RETROGRADE PYELOGRAM, URETEROSCOPY AND STENT PLACEMENT Right 02/02/2013   Procedure: CYSTOSCOPY WITH RETROGRADE PYELOGRAM, AND Right STENT PLACEMENT;  Surgeon: Molli Hazard, MD;  Location: [facility];  Service: Urology;  Laterality: Right;  . CYSTOSCOPY WITH RETROGRADE PYELOGRAM, URETEROSCOPY AND STENT PLACEMENT Right 02/15/2013   Procedure: CYSTOSCOPY WITH RETROGRADE PYELOGRAM, URETEROSCOPY AND STENT EXCHANGE;  Surgeon: Molli Hazard, MD;  Location: [facility];  Service: Urology;  Laterality: Right;  . ESI     ; for cervical radiculopathy  . HEMORRHOID SURGERY  07-03-1999  . HOLMIUM LASER APPLICATION Right 1/0/1751   Procedure: HOLMIUM LASER APPLICATION;  Surgeon: Molli Hazard, MD;  Location: [facility];  Service: Urology;  Laterality: Right;  . PROSTATE BIOPSY    . RADIOACTIVE SEED IMPLANT N/A 04/07/2016   Procedure: RADIOACTIVE SEED IMPLANT/BRACHYTHERAPY IMPLANT;  Surgeon: Festus Aloe, MD;  Location: [facility];  Service: Urology;  Laterality: N/A;  55 seeds implanted  . SHOULDER OPEN ROTATOR CUFF REPAIR Bilateral RIGHT X2  LAST ONE 1997/  LEFT 2005   right inferior glenoid screw retained  . VIDEO BRONCHOSCOPY Bilateral 09/24/2014   Procedure: VIDEO BRONCHOSCOPY WITH FLUORO;  Surgeon: Juanito Doom, MD;  Location: [place];  Service: Cardiopulmonary;  Laterality: Bilateral;    Social History   Socioeconomic History  . Marital status: Married    Spouse name: Not on file  . Number of children: Not on file  . Years of education: Not on file  . Highest education level: Not on file  Occupational History  . Not on file  Social Needs  . Financial resource strain: Not on file  . Food insecurity:    Worry: Not on file    Inability: Not on file  . Transportation needs:    Medical: Not on file    Non-medical: Not on file  Tobacco Use  . Smoking status: Former  Smoker    Packs/day: 2.00    Years: 3.00    Pack years: 6.00    Types: Cigarettes    Last attempt to  quit: 01/12/1970    Years since quitting: 47.8  . Smokeless tobacco: Never Used  Substance and Sexual Activity  . Alcohol use: No    Alcohol/week: 0.0 standard drinks  . Drug use: No  . Sexual activity: Not on file  Lifestyle  . Physical activity:    Days per week: Not on file    Minutes per session: Not on file  . Stress: Not on file  Relationships  . Social connections:    Talks on phone: Not on file    Gets together: Not on file    Attends religious service: Not on file    Active member of club or organization: Not on file    Attends meetings of clubs or organizations: Not on file    Relationship status: Not on file  Other Topics Concern  . Not on file  Social History Narrative  . Not on file    Family History  Problem Relation Age of Onset  . Stroke Father        in 25s  . Colon cancer Father        upper 19's  . Cancer Father        colon  . Heart attack Brother 27       stent  . Cancer Brother        pre cancerous bladder lesion  . Diabetes Paternal Uncle   . Cancer Cousin        prostate  . Cancer Other        bladder  . Arthritis Neg Hx   . Asthma Neg Hx   . COPD Neg Hx   . Rectal cancer Neg Hx   . Stomach cancer Neg Hx     Review of Systems  Constitutional: Positive for diaphoresis (at night). Negative for chills and fever.  HENT: Positive for congestion, postnasal drip, sinus pain and tinnitus (worse than usual). Negative for ear pain and sore throat.   Respiratory: Positive for cough, shortness of breath and wheezing.   Gastrointestinal: Negative for diarrhea and nausea.  Musculoskeletal: Negative for myalgias.  Neurological: Positive for dizziness and headaches.       Objective:   Vitals:   11/05/17 0935  BP: 116/62  Pulse: 65  Resp: 16  Temp: 98.5 F (36.9 C)  SpO2: 96%   Filed Weights   11/05/17 0935  Weight: 226 lb (102.5 kg)     Body mass index is 29.82 kg/m.  Wt Readings from Last 3 Encounters:  11/05/17 226 lb (102.5 kg)  10/11/17 229 lb (103.9 kg)  05/21/17 237 lb (107.5 kg)     Physical Exam GENERAL APPEARANCE: Mildly ill appearing, NAD EYES: conjunctiva clear, no icterus HEENT: bilateral tympanic membranes and ear canals normal, oropharynx with no erythema, maxillary sinus tenderness with palpation, no thyromegaly, trachea midline, no cervical or supraclavicular lymphadenopathy LUNGS: Clear to auscultation without wheeze or crackles, unlabored breathing, good air entry bilaterally CARDIOVASCULAR: Normal S1,S2 without murmurs, no edema SKIN: warm, dry        Assessment & Plan:   See Problem List for Assessment and Plan of chronic medical problems.

## 2017-11-04 NOTE — Telephone Encounter (Signed)
He has had two antibiotics in the past month, and I am very hesitant to give him a third.  The doxycycline does treat sinus infections as does the augmentin.    He should probably be re-evaluated.

## 2017-11-04 NOTE — Telephone Encounter (Addendum)
Pt saw  dr burns on 10-11-17 and wife is calling to request zpak. Pt is still having head congestion. [facility] [place]

## 2017-11-04 NOTE — Telephone Encounter (Signed)
Pt has an appointment tomorrow to see you for this issue.

## 2017-11-05 ENCOUNTER — Encounter: Payer: Self-pay | Admitting: Internal Medicine

## 2017-11-05 ENCOUNTER — Ambulatory Visit (INDEPENDENT_AMBULATORY_CARE_PROVIDER_SITE_OTHER): Payer: PPO | Admitting: Internal Medicine

## 2017-11-05 VITALS — BP 116/62 | HR 65 | Temp 98.5°F | Resp 16 | Ht 73.0 in | Wt 226.0 lb

## 2017-11-05 DIAGNOSIS — J0100 Acute maxillary sinusitis, unspecified: Secondary | ICD-10-CM

## 2017-11-05 MED ORDER — AZITHROMYCIN 250 MG PO TABS: ORAL_TABLET | ORAL | 0 refills | Status: DC

## 2017-11-05 NOTE — Patient Instructions (Addendum)
Take the zpak as prescribed.  Continue the mucinex, tylenol/ aleve, saline nasal sprays and vicks nasal spray.    Drink lots of fluids and increase your rest.     Call if no improvement      Sinusitis, Adult Sinusitis is soreness and inflammation of your sinuses. Sinuses are hollow spaces in the bones around your face. Your sinuses are located:  Around your eyes.  In the middle of your forehead.  Behind your nose.  In your cheekbones.  Your sinuses and nasal passages are lined with a stringy fluid (mucus). Mucus normally drains out of your sinuses. When your nasal tissues become inflamed or swollen, the mucus can become trapped or blocked so air cannot flow through your sinuses. This allows bacteria, viruses, and funguses to grow, which leads to infection. Sinusitis can develop quickly and last for 7?10 days (acute) or for more than 12 weeks (chronic). Sinusitis often develops after a cold. What are the causes? This condition is caused by anything that creates swelling in the sinuses or stops mucus from draining, including:  Allergies.  Asthma.  Bacterial or viral infection.  Abnormally shaped bones between the nasal passages.  Nasal growths that contain mucus (nasal polyps).  Narrow sinus openings.  Pollutants, such as chemicals or irritants in the air.  A foreign object stuck in the nose.  A fungal infection. This is rare.  What increases the risk? The following factors may make you more likely to develop this condition:  Having allergies or asthma.  Having had a recent cold or respiratory tract infection.  Having structural deformities or blockages in your nose or sinuses.  Having a weak immune system.  Doing a lot of swimming or diving.  Overusing nasal sprays.  Smoking.  What are the signs or symptoms? The main symptoms of this condition are pain and a feeling of pressure around the affected sinuses. Other symptoms include:  Upper  toothache.  Earache.  Headache.  Bad breath.  Decreased sense of smell and taste.  A cough that may get worse at night.  Fatigue.  Fever.  Thick drainage from your nose. The drainage is often green and it may contain pus (purulent).  Stuffy nose or congestion.  Postnasal drip. This is when extra mucus collects in the throat or back of the nose.  Swelling and warmth over the affected sinuses.  Sore throat.  Sensitivity to light.  How is this diagnosed? This condition is diagnosed based on symptoms, a medical history, and a physical exam. To find out if your condition is acute or chronic, your health care provider may:  Look in your nose for signs of nasal polyps.  Tap over the affected sinus to check for signs of infection.  View the inside of your sinuses using an imaging device that has a light attached (endoscope).  If your health care provider suspects that you have chronic sinusitis, you may also:  Be tested for allergies.  Have a sample of mucus taken from your nose (nasal culture) and checked for bacteria.  Have a mucus sample examined to see if your sinusitis is related to an allergy.  If your sinusitis does not respond to treatment and it lasts longer than 8 weeks, you may have an MRI or CT scan to check your sinuses. These scans also help to determine how severe your infection is. In rare cases, a bone biopsy may be done to rule out more serious types of fungal sinus disease. How is this treated?  Treatment for sinusitis depends on the cause and whether your condition is chronic or acute. If a virus is causing your sinusitis, your symptoms will go away on their own within 10 days. You may be given medicines to relieve your symptoms, including:  Topical nasal decongestants. They shrink swollen nasal passages and let mucus drain from your sinuses.  Antihistamines. These drugs block inflammation that is triggered by allergies. This can help to ease swelling in  your nose and sinuses.  Topical nasal corticosteroids. These are nasal sprays that ease inflammation and swelling in your nose and sinuses.  Nasal saline washes. These rinses can help to get rid of thick mucus in your nose.  If your condition is caused by bacteria, you will be given an antibiotic medicine. If your condition is caused by a fungus, you will be given an antifungal medicine. Surgery may be needed to correct underlying conditions, such as narrow nasal passages. Surgery may also be needed to remove polyps. Follow these instructions at home: Medicines  Take, use, or apply over-the-counter and prescription medicines only as told by your health care provider. These may include nasal sprays.  If you were prescribed an antibiotic medicine, take it as told by your health care provider. Do not stop taking the antibiotic even if you start to feel better. Hydrate and Humidify  Drink enough water to keep your urine clear or pale yellow. Staying hydrated will help to thin your mucus.  Use a cool mist humidifier to keep the humidity level in your home above 50%.  Inhale steam for 10-15 minutes, 3-4 times a day or as told by your health care provider. You can do this in the bathroom while a hot shower is running.  Limit your exposure to cool or dry air. Rest  Rest as much as possible.  Sleep with your head raised (elevated).  Make sure to get enough sleep each night. General instructions  Apply a warm, moist washcloth to your face 3-4 times a day or as told by your health care provider. This will help with discomfort.  Wash your hands often with soap and water to reduce your exposure to viruses and other germs. If soap and water are not available, use hand sanitizer.  Do not smoke. Avoid being around people who are smoking (secondhand smoke).  Keep all follow-up visits as told by your health care provider. This is important. Contact a health care provider if:  You have a  fever.  Your symptoms get worse.  Your symptoms do not improve within 10 days. Get help right away if:  You have a severe headache.  You have persistent vomiting.  You have pain or swelling around your face or eyes.  You have vision problems.  You develop confusion.  Your neck is stiff.  You have trouble breathing. This information is not intended to replace advice given to you by your health care provider. Make sure you discuss any questions you have with your health care provider. Document Released: 12/29/2004 Document Revised: 08/25/2015 Document Reviewed: 10/24/2014 Elsevier Interactive Patient Education  [employer].

## 2017-11-05 NOTE — Assessment & Plan Note (Signed)
He has a persistent sinus infection despite being on antibiotics in the past Discussed that I am hesitant to give him more antibiotics, but unfortunately I think he does need them Z-Pak-his wife had a recent sinus infection and that seemed to work well for her Stressed continuing the over-the-counter cold medications-Tylenol, Aleve, Mucinex, nasal sprays to help Rest, fluids Call if no improvement-if his symptoms do not improve I will refer to ENT

## 2017-11-07 ENCOUNTER — Encounter: Payer: Self-pay | Admitting: Internal Medicine

## 2017-11-29 ENCOUNTER — Ambulatory Visit: Payer: Self-pay

## 2017-11-29 NOTE — Telephone Encounter (Signed)
Returned call to patient who states he is having a reocurrance of his sinus infection He states his nose is blocked and then runny He is ache and feverish He feels chilled. Pt is requesting that the doctor call something for him so he doesn't have to get out. Appointment scheduled per protocol. Care advice read to patient. Pt verbalized understanding of instructions.  Reason for Disposition . [1] Sinus congestion (pressure, fullness) AND [2] present > 10 days  Answer Assessment - Initial Assessment Questions 1. LOCATION: "Where does it hurt?"      Across cheeks 2. ONSET: "When did the sinus pain start?"  (e.g., hours, days)      Saturday afternoon 3. SEVERITY: "How bad is the pain?"   (Scale 1-10; mild, moderate or severe)   - MILD (1-3): doesn't interfere with normal activities    - MODERATE (4-7): interferes with normal activities (e.g., work or school) or awakens from sleep   - SEVERE (8-10): excruciating pain and patient unable to do any normal activities        4 4. RECURRENT SYMPTOM: "Have you ever had sinus problems before?" If so, ask: "When was the last time?" and "What happened that time?"    Yes treated 2 weeks ago 5. NASAL CONGESTION: "Is the nose blocked?" If so, ask, "Can you open it or must you breathe through the mouth?"     Mouth breathing 6. NASAL DISCHARGE: "Do you have discharge from your nose?" If so ask, "What color?"     clear 7. FEVER: "Do you have a fever?" If so, ask: "What is it, how was it measured, and when did it start?"     Just feverish and chilled 8. OTHER SYMPTOMS: "Do you have any other symptoms?" (e.g., sore throat, cough, earache, difficulty breathing)    Sore throat 9. PREGNANCY: "Is there any chance you are pregnant?" "When was your last menstrual period?"    N/A  Protocols used: SINUS PAIN OR CONGESTION-A-AH

## 2017-11-30 ENCOUNTER — Ambulatory Visit (INDEPENDENT_AMBULATORY_CARE_PROVIDER_SITE_OTHER): Payer: PPO | Admitting: Family

## 2017-11-30 ENCOUNTER — Encounter: Payer: Self-pay | Admitting: Family

## 2017-11-30 VITALS — BP 118/76 | HR 67 | Temp 98.7°F | Ht 73.0 in | Wt 226.1 lb

## 2017-11-30 DIAGNOSIS — J329 Chronic sinusitis, unspecified: Secondary | ICD-10-CM

## 2017-11-30 MED ORDER — PREDNISONE 20 MG PO TABS: 40.0000 mg | ORAL_TABLET | Freq: Every day | ORAL | 0 refills | Status: DC

## 2017-11-30 MED ORDER — CEFDINIR 300 MG PO CAPS
300.0000 mg | ORAL_CAPSULE | Freq: Two times a day (BID) | ORAL | 0 refills | Status: DC
Start: 2017-11-30 — End: 2018-06-22

## 2017-11-30 NOTE — Telephone Encounter (Signed)
Pt was seen by Mickel Baas today.

## 2017-11-30 NOTE — Progress Notes (Signed)
David Cantrell is a 77 y.o. male with the following history as recorded in EpicCare:  Patient Active Problem List   Diagnosis Date Noted  . Rib pain on right side 10/11/2017  . Diverticulitis 10/11/2017  . Lumbar back pain 04/13/2017  . Piriformis syndrome 01/18/2017  . Increased frequency of urination 10/13/2016  . Thoracic aortic aneurysm ([place]) 04/12/2016  . Malignant neoplasm of prostate ([place]) 01/31/2016  . Subacute maxillary sinusitis 06/19/2015  . Centrilobular emphysema ([place]) 12/12/2014  . Bronchiectasis without acute exacerbation ([place]) 09/11/2014  . Bulging lumbar disc 10/03/2013  . Open-angle glaucoma 05/05/2013  . 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 Multiple pulmonary nodules 02/24/2013  . Abnormal CT scan, kidney 02/24/2013  . Basal cell carcinoma of [place]a n[place] 02/24/2013  . At risk for obstructive sleep apnea 02/09/2013  . 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 COLONIC POLYPS, HX OF 12/13/2007  . Hyperlipidemia 12/03/2006  . Essential hypertension 12/03/2006  . Coronary atherosclerosis 12/03/2006  . 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 Nevus, non-neoplastic 09/24/2006  . RHINITIS, ALLERGIC NOS 09/24/2006  . Exercise-induced bronchospasm 09/24/2006    Current Outpatient Medications  Medication Sig Dispense Refill  . acetaminophen (TYLENOL) 500 MG tablet Take 1,000 mg by mouth every 6 (six) hours as needed for mild pain.     Marland Kitchen albuterol (PROVENTIL HFA;VENTOLIN HFA) 108 (90 BASE) MCG/ACT inhaler Inhale 2 puffs into the lungs every 6 (six) hours as needed for wheezing or shortness of breath. 1 Inhaler 2  . alfuzosin (UROXATRAL) 10 MG 24 hr tablet Take 1 tablet (10 mg total) by mouth daily before supper. 30 tablet 0  . aspirin EC 81 MG tablet Take 81 mg by mouth every evening.     . brimonidine (ALPHAGAN) 0.15 % ophthalmic solution Place 1 drop into the right eye 2 (two) times daily.     . Coenzyme Q10 (COQ10) 100 MG CAPS Take 1 capsule by  mouth daily.    . cyclobenzaprine (FLEXERIL) 5 MG tablet Take 1 tablet (5 mg total) by mouth daily as needed for muscle spasms. 30 tablet 1  . dextromethorphan-guaiFENesin (MUCINEX DM) 30-600 MG 12hr tablet Take 1 tablet by mouth 2 (two) times daily as needed for cough.     . dorzolamide-timolol (COSOPT) 22.3-6.8 MG/ML ophthalmic solution Place 1 drop into the right eye 2 (two) times daily.    . fenofibrate 160 MG tablet Take 160 mg by mouth every morning.     . fexofenadine (ALLEGRA) 180 MG tablet Take 180 mg by mouth daily as needed for allergies or rhinitis.    . Flaxseed, Linseed, 1300 MG CAPS Take by mouth 2 (two) times daily with a meal.    . fluticasone furoate-vilanterol (BREO ELLIPTA) 100-25 MCG/INH AEPB Inhale 1 puff into the lungs daily. (Patient taking differently: Inhale 1 puff into the lungs daily as needed (SOB). ) 1 each 0  . GARLIC PO Take 1 capsule by mouth daily.    . hydrocortisone ointment 0.5 % Apply 1 application topically 2 (two) times daily as needed for itching.     . hyoscyamine (LEVSIN SL) 0.125 MG SL tablet Place 1 tablet (0.125 mg total) under the tongue as needed. MUST HAVE OFFICE VISIT WITH DR PYRTLE FOR FURTHER REFILLS!!! 20 tablet 0  . methocarbamol (ROBAXIN) 500 MG tablet Take 1 tablet (500 mg total) by mouth 4 (four) times daily. (Patient taking differently: Take 500 mg by mouth 4 (four) times daily as needed for muscle spasms. )  120 tablet 1  . metoprolol tartrate (LOPRESSOR) 25 MG tablet Take 1 tablet (25 mg total) by mouth 2 (two) times daily. (Patient taking differently: Take 12.5 mg by mouth 2 (two) times daily. ) 180 tablet 1  . Multiple Vitamins-Minerals (MULTIVITAMIN ADULTS PO) Take 1 tablet by mouth daily.     Marland Kitchen NITROSTAT 0.4 MG SL tablet Place 0.4 mg under the tongue every 5 (five) minutes as needed for chest pain.     . Omega-3 Fatty Acids (FISH OIL) 1200 MG CAPS Take 1 capsule by mouth 2 (two) times daily. 1 by mouth daily    . ondansetron (ZOFRAN) 4 MG  tablet Take 1 tablet (4 mg total) by mouth every 8 (eight) hours as needed. 12 tablet 0  . psyllium (METAMUCIL) 58.6 % packet Take 1 packet by mouth every morning.     . rosuvastatin (CRESTOR) 10 MG tablet Take 1 tablet (10 mg total) by mouth 2 (two) times a week. Monday and thursday 30 tablet 0  . shark liver oil-cocoa butter (PREPARATION H) 0.25-88.44 % suppository Place 1 suppository rectally as needed for hemorrhoids.    . tamsulosin (FLOMAX) 0.4 MG CAPS capsule Take 1 capsule (0.4 mg total) by mouth daily. (Patient taking differently: Take 0.8 mg by mouth daily. ) 30 capsule 2  . cefdinir (OMNICEF) 300 MG capsule Take 1 capsule (300 mg total) by mouth 2 (two) times daily. 20 capsule 0  . predniSONE (DELTASONE) 20 MG tablet Take 2 tablets (40 mg total) by mouth daily with breakfast. 10 tablet 0   No current facility-administered medications for this visit.     Allergies: Doxycycline; Lipitor [atorvastatin]; Morphine and related; Niacin and related; Ramipril; Sulfa antibiotics; and Codeine  Past Medical History:  Diagnosis Date  . Bronchiectasis ([place])    per pulmologist note -- dr Lake Bells-- related to multiple episodes pneumonia as adult  . CAD (coronary artery disease)    CARDIOLOGIST--  DR Wynonia Lawman  . Centrilobular emphysema ([place])   . Congenital renal cyst   . COPD (chronic obstructive pulmonary disease) ([facility])    pulmologist-  dr Lake Bells  . Diverticulosis of colon   . Dyspnea on exertion   . First degree heart block   . Hemorrhoids    CURRENTLY INFLARED DUE TO RADIATION THERAPY  . History of adenomatous polyp of colon    hx multiple tubular adenoma's and hyperplastic polyp's since 2001--- last colonoscopy 05-29-2015  . History of basal cell carcinoma excision    10-31-2012  nose  . History of external beam radiation therapy 02-01-2016  to 03-16-2016   prostate 45Gy  plus radioactive prostate seed implants 04-07-2016  . History of kidney stones   . Hyperlipidemia   . Hypertension    . Multiple pulmonary nodules    S/P  BILATERAL BRONCHOSCOPY  09-24-2014  no infection --    . Open-angle glaucoma    RIGHT EYE ONLY/OPEN ANGLE  . [facility]state [facility]) urologist-  dr eskridge/  oncologist-  dr Tammi Klippel   Stage T1c,  Gleason3+4, PSA 4.7--  s/p  external beam radiation therapy 02-01-2016 to 03-16-2016  . Seasonal allergies   . Thoracic aortic aneurysm ([place])    monitored by dr [name] (vascular surgeon)--- 10-09-2015 last CT 4.2cm, stable  . Wears dentures    full upper and lower parital    Past Surgical History:  Procedure Laterality Date  . CARDIAC CATHETERIZATION  09-04-2002  DR Wynonia Lawman   NORMAL LVF/  MILD TO MODERATE CAD INVOLVING PROXIMAL 40%  AND MID 40%  LAD/  POSTERIOR DESCENDING CX 70%  . CARDIAC CATHETERIZATION  06-05-2010  DR Fidela Juneau   NORMAL LM/  40% PROXIMAL MID LAD/ 70% DISTAL CFX/  80% PROXIMAL PDA/  SMALL & NONDOMINANT RCA  . CARDIOVASCULAR STRESS TEST  06-01-2013   dr Wynonia Lawman   normal lexiscan myoview w/ no ischemia/ normal LV function and wall motion, ef 61%  . COLONOSCOPY W/ POLYPECTOMY  last one 05-29-2015  . CYSTOSCOPY N/A 04/07/2016   Procedure: CYSTOSCOPY FLEXIBLE;  Surgeon: Festus Aloe, MD;  Location[facility];  Service: Urology;  Laterality: N/A;  no seeds found in bladder  . CYSTOSCOPY WITH RETROGRADE PYELOGRAM, URETEROSCOPY AND STENT PLACEMENT Right 02/02/2013   Procedure: CYSTOSCOPY WITH RETROGRADE PYELOGRAM, AND Right STENT PLACEMENT;  Surgeon: Molli Hazard, MD;  Location: [facility];  Service: Urology;  Laterality: Right;  . CYSTOSCOPY WITH RETROGRADE PYELOGRAM, URETEROSCOPY AND STENT PLACEMENT Right 02/15/2013   Procedure: CYSTOSCOPY WITH RETROGRADE PYELOGRAM, URETEROSCOPY AND STENT EXCHANGE;  Surgeon: Molli Hazard, MD;  Location: [facility];  Service: Urology;  Laterality: Right;  . ESI     ; for cervical radiculopathy  . HEMORRHOID SURGERY  07-03-1999  . HOLMIUM LASER APPLICATION Right 04/17/386    Procedure: HOLMIUM LASER APPLICATION;  Surgeon: Molli Hazard, MD;  Location: [facility];  Service: Urology;  Laterality: Right;  . PROSTATE BIOPSY    . RADIOACTIVE SEED IMPLANT N/A 04/07/2016   Procedure: RADIOACTIVE SEED IMPLANT/BRACHYTHERAPY IMPLANT;  Surgeon: Festus Aloe, MD;  Location: [facility];  Service: Urology;  Laterality: N/A;  55 seeds implanted  . SHOULDER OPEN ROTATOR CUFF REPAIR Bilateral RIGHT X2  LAST ONE 1997/  LEFT 2005   right inferior glenoid screw retained  . VIDEO BRONCHOSCOPY Bilateral 09/24/2014   Procedure: VIDEO BRONCHOSCOPY WITH FLUORO;  Surgeon: Juanito Doom, MD;  Location: [place];  Service: Cardiopulmonary;  Laterality: Bilateral;    Family History  Problem Relation Age of Onset  . Stroke Father        in 54s  . Colon cancer Father        upper 54's  . Cancer Father        colon  . Heart attack Brother 58       stent  . Cancer Brother        pre cancerous bladder lesion  . Diabetes Paternal Uncle   . Cancer Cousin        prostate  . Cancer Other        bladder  . Arthritis Neg Hx   . Asthma Neg Hx   . COPD Neg Hx   . Rectal cancer Neg Hx   . Stomach cancer Neg Hx     Social History   Tobacco Use  . Smoking status: Former Smoker    Packs/day: 2.00    Years: 3.00    Pack years: 6.00    Types: Cigarettes    Last attempt to quit: 01/12/1970    Years since quitting: 47.9  . Smokeless tobacco: Never Used  Substance Use Topics  . Alcohol use: No    Alcohol/week: 0.0 standard drinks    Subjective:  Patient presents with concerns for recurrent sinus infections; was treated at end of September/ October with limited benefit- 3 different round of antibiotics- Augmentin, Doxycycline, Zithromax; symptoms re-flared over the weekend; + sinus pain/ pressure especially over left sinuses; no fever, no chest pain, no shortness of breath; has never  had problems with recurrent sinusitis in the past;     Objective:  Vitals:   11/30/17 1113  BP: 118/76  Pulse: 67  Temp: 98.7 F (37.1 C)  TempSrc: Oral  SpO2: 96%  Weight: 226 lb 1.9 oz (102.6 kg)  Height: 6\' 1"  (1.854 m)    General: Well developed, well nourished, in no acute distress  Skin : Warm and dry.  Head: Normocephalic and atraumatic  Eyes: Sclera and conjunctiva clear; pupils round and reactive to light; extraocular movements intact  Ears: External normal; canals clear; tympanic membranes normal  Oropharynx: Pink, supple. No suspicious lesions  Neck: Supple without thyromegaly, adenopathy  Lungs: Respirations unlabored; clear to auscultation bilaterally without wheeze, rales, rhonchi  CVS exam: normal rate and regular rhythm.  Neurologic: Alert and oriented; speech intact; face symmetrical; moves all extremities well; CNII-XII intact without focal deficit   Assessment:  1. Recurrent sinusitis     Plan:  Rx for Omnicef 300 mg bid x 10 days, Prednisone 40 mg qd x 5 days; hold Netti Pot for now; refer to ENT due to recurrent infections; follow-up with his PCP as needed otherwise.    No follow-ups on file.  Orders Placed This Encounter  Procedures  . Ambulatory referral to ENT    Referral Priority:   Routine    Referral Type:   Consultation    Referral Reason:   Specialty Services Required    Referred to Provider:   Izora Gala, MD    Requested Specialty:   Otolaryngology    Number of Visits Requested:   1    Requested Prescriptions   Signed Prescriptions Disp Refills  . predniSONE (DELTASONE) 20 MG tablet 10 tablet 0    Sig: Take 2 tablets (40 mg total) by mouth daily with breakfast.  . cefdinir (OMNICEF) 300 MG capsule 20 capsule 0    Sig: Take 1 capsule (300 mg total) by mouth 2 (two) times daily.

## 2017-12-01 DIAGNOSIS — L821 Other seborrheic keratosis: Secondary | ICD-10-CM | POA: Diagnosis not present

## 2017-12-01 DIAGNOSIS — L570 Actinic keratosis: Secondary | ICD-10-CM | POA: Diagnosis not present

## 2017-12-01 DIAGNOSIS — L819 Disorder of pigmentation, unspecified: Secondary | ICD-10-CM | POA: Diagnosis not present

## 2017-12-01 DIAGNOSIS — D225 Melanocytic nevi of trunk: Secondary | ICD-10-CM | POA: Diagnosis not present

## 2017-12-01 DIAGNOSIS — D1801 Hemangioma of skin and subcutaneous tissue: Secondary | ICD-10-CM | POA: Diagnosis not present

## 2017-12-22 ENCOUNTER — Encounter: Payer: Self-pay | Admitting: Cardiology

## 2018-01-11 ENCOUNTER — Other Ambulatory Visit: Payer: Self-pay | Admitting: Internal Medicine

## 2018-01-13 ENCOUNTER — Other Ambulatory Visit: Payer: Self-pay | Admitting: Cardiology

## 2018-01-13 MED ORDER — FENOFIBRATE 160 MG PO TABS: 160.0000 mg | ORAL_TABLET | Freq: Every morning | ORAL | 0 refills | Status: DC

## 2018-01-13 NOTE — Telephone Encounter (Signed)
A 30 day supply of fenofibrate sent to the [facility] in [place]. Patient is scheduled to see Dr. Martinique on 01/17/2018 at 8:40 am at the [facility] office.

## 2018-01-13 NOTE — Progress Notes (Signed)
Cardiology Office Note   Date:  01/17/2018   ID:  David Cantrell, DOB 04-Jan-1941, MRN [id number]  PCP:  [name], MD  Cardiologist:   Peter Martinique, MD   Chief Complaint  Patient presents with  . Coronary Artery Disease      History of Present Illness: David Cantrell is a 78 y.o. male who presents for evaluation of CAD. He is a patient of Dr Wynonia Lawman. He has a history of CAD, HTN, and HLD. He had a cardiac cath in 2012 showing obstructive disease in a left PDA. This was managed medically. Myoview study in 2015 was normal.   On follow up today he is doing well from a cardiac standpoint. He is fairly sedentary due to back problems. Will occasionally walk on a treadmill. No symptoms of dyspnea, chest pain or palpitations. Last lipids check one year ago. BP has been well controlled.     Past Medical History:  Diagnosis Date  . Bronchiectasis ([place])    per pulmologist note -- dr Lake Bells-- related to multiple episodes pneumonia as adult  . CAD (coronary artery disease)    CARDIOLOGIST--  DR Wynonia Lawman  . Centrilobular emphysema ([place])   . Congenital renal cyst   . COPD (chronic obstructive pulmonary disease) ([facility])    pulmologist-  dr Lake Bells  . Diverticulosis of colon   . Dyspnea on exertion   . First degree heart block   . Hemorrhoids    CURRENTLY INFLARED DUE TO RADIATION THERAPY  . History of adenomatous polyp of colon    hx multiple tubular adenoma's and hyperplastic polyp's since 2001--- last colonoscopy 05-29-2015  . History of basal cell carcinoma excision    10-31-2012  nose  . History of external beam radiation therapy 02-01-2016  to 03-16-2016   prostate 45Gy  plus radioactive prostate seed implants 04-07-2016  . History of kidney stones   . Hyperlipidemia   . Hypertension   . Multiple pulmonary nodules    S/P  BILATERAL BRONCHOSCOPY  09-24-2014  no infection --    . Open-angle glaucoma    RIGHT EYE ONLY/OPEN ANGLE  . Prostate cancer [facility]) urologist-  dr eskridge/   oncologist-  dr Tammi Klippel   Stage T1c,  Gleason3+4, PSA 4.7--  s/p  external beam radiation therapy 02-01-2016 to 03-16-2016  . Seasonal allergies   . Thoracic aortic aneurysm ([place])    monitored by dr [name] (vascular surgeon)--- 10-09-2015 last CT 4.2cm, stable  . Wears dentures    full upper and lower parital    Past Surgical History:  Procedure Laterality Date  . CARDIAC CATHETERIZATION  09-04-2002  DR Wynonia Lawman   NORMAL LVF/  MILD TO MODERATE CAD INVOLVING PROXIMAL 40%  AND MID 40%  LAD/  POSTERIOR DESCENDING CX 70%  . CARDIAC CATHETERIZATION  06-05-2010  DR Fidela Juneau   NORMAL LM/  40% PROXIMAL MID LAD/ 70% DISTAL CFX/  80% PROXIMAL PDA/  SMALL & NONDOMINANT RCA  . CARDIOVASCULAR STRESS TEST  06-01-2013   dr Wynonia Lawman   normal lexiscan myoview w/ no ischemia/ normal LV function and wall motion, ef 61%  . COLONOSCOPY W/ POLYPECTOMY  last one 05-29-2015  . CYSTOSCOPY N/A 04/07/2016   Procedure: CYSTOSCOPY FLEXIBLE;  Surgeon: Festus Aloe, MD;  Location: [facility];  Service: Urology;  Laterality: N/A;  no seeds found in bladder  . CYSTOSCOPY WITH RETROGRADE PYELOGRAM, URETEROSCOPY AND STENT PLACEMENT Right 02/02/2013   Procedure: CYSTOSCOPY WITH RETROGRADE PYELOGRAM, AND Right STENT PLACEMENT;  Surgeon: Molli Hazard, MD;  Location: [facility];  Service: Urology;  Laterality: Right;  . CYSTOSCOPY WITH RETROGRADE PYELOGRAM, URETEROSCOPY AND STENT PLACEMENT Right 02/15/2013   Procedure: CYSTOSCOPY WITH RETROGRADE PYELOGRAM, URETEROSCOPY AND STENT EXCHANGE;  Surgeon: Molli Hazard, MD;  Location: [facility];  Service: Urology;  Laterality: Right;  . ESI     ; for cervical radiculopathy  . HEMORRHOID SURGERY  07-03-1999  . HOLMIUM LASER APPLICATION Right 07/12/624   Procedure: HOLMIUM LASER APPLICATION;  Surgeon: Molli Hazard, MD;  Location: [facility];  Service: Urology;  Laterality: Right;  . PROSTATE BIOPSY    . RADIOACTIVE SEED  IMPLANT N/A 04/07/2016   Procedure: RADIOACTIVE SEED IMPLANT/BRACHYTHERAPY IMPLANT;  Surgeon: Festus Aloe, MD;  Location: [facility];  Service: Urology;  Laterality: N/A;  55 seeds implanted  . SHOULDER OPEN ROTATOR CUFF REPAIR Bilateral RIGHT X2  LAST ONE 1997/  LEFT 2005   right inferior glenoid screw retained  . VIDEO BRONCHOSCOPY Bilateral 09/24/2014   Procedure: VIDEO BRONCHOSCOPY WITH FLUORO;  Surgeon: Juanito Doom, MD;  Location: [facility];  Service: Cardiopulmonary;  Laterality: Bilateral;     Current Outpatient Medications  Medication Sig Dispense Refill  . albuterol (PROVENTIL HFA;VENTOLIN HFA) 108 (90 BASE) MCG/ACT inhaler Inhale 2 puffs into the lungs every 6 (six) hours as needed for wheezing or shortness of breath. 1 Inhaler 2  . alfuzosin (UROXATRAL) 10 MG 24 hr tablet Take 1 tablet (10 mg total) by mouth daily before supper. 30 tablet 0  . aspirin EC 81 MG tablet Take 81 mg by mouth every evening.     . brimonidine (ALPHAGAN) 0.15 % ophthalmic solution Place 1 drop into the right eye 2 (two) times daily.     . cefdinir (OMNICEF) 300 MG capsule Take 1 capsule (300 mg total) by mouth 2 (two) times daily. 20 capsule 0  . Coenzyme Q10 (COQ10) 100 MG CAPS Take 1 capsule by mouth daily.    . dorzolamide-timolol (COSOPT) 22.3-6.8 MG/ML ophthalmic solution Place 1 drop into the right eye 2 (two) times daily.    . fenofibrate 160 MG tablet Take 1 tablet (160 mg total) by mouth every morning. 30 tablet 0  . fexofenadine (ALLEGRA) 180 MG tablet Take 180 mg by mouth daily as needed for allergies or rhinitis.    . Flaxseed, Linseed, 1300 MG CAPS Take by mouth 2 (two) times daily with a meal.    . fluticasone furoate-vilanterol (BREO ELLIPTA) 100-25 MCG/INH AEPB Inhale 1 puff into the lungs daily. (Patient taking differently: Inhale 1 puff into the lungs daily as needed (SOB). ) 1 each 0  . GARLIC PO Take 1 capsule by mouth daily.    . hydrocortisone ointment 0.5 %  Apply 1 application topically 2 (two) times daily as needed for itching.     . hyoscyamine (LEVSIN SL) 0.125 MG SL tablet Place 1 tablet (0.125 mg total) under the tongue as needed. MUST HAVE OFFICE VISIT WITH DR PYRTLE FOR FURTHER REFILLS!!! 20 tablet 0  . metoprolol tartrate (LOPRESSOR) 25 MG tablet Take 1 tablet by mouth twice daily. Needs office visit for more refills. 180 tablet 0  . Multiple Vitamins-Minerals (MULTIVITAMIN ADULTS PO) Take 1 tablet by mouth daily.     Marland Kitchen NITROSTAT 0.4 MG SL tablet Place 0.4 mg under the tongue every 5 (five) minutes as needed for chest pain.     . Omega-3 Fatty Acids (FISH OIL) 1200 MG CAPS Take 1  capsule by mouth 2 (two) times daily. 1 by mouth daily    . ondansetron (ZOFRAN) 4 MG tablet Take 1 tablet (4 mg total) by mouth every 8 (eight) hours as needed. 12 tablet 0  . predniSONE (DELTASONE) 20 MG tablet Take 2 tablets (40 mg total) by mouth daily with breakfast. 10 tablet 0  . psyllium (METAMUCIL) 58.6 % packet Take 1 packet by mouth every morning.     . rosuvastatin (CRESTOR) 10 MG tablet Take 1 tablet (10 mg total) by mouth 2 (two) times a week. Monday and thursday 30 tablet 0  . shark liver oil-cocoa butter (PREPARATION H) 0.25-88.44 % suppository Place 1 suppository rectally as needed for hemorrhoids.    . tamsulosin (FLOMAX) 0.4 MG CAPS capsule Take 1 capsule (0.4 mg total) by mouth daily. (Patient taking differently: Take 0.8 mg by mouth daily. ) 30 capsule 2  . acetaminophen (TYLENOL) 500 MG tablet Take 1,000 mg by mouth every 6 (six) hours as needed for mild pain.     . cyclobenzaprine (FLEXERIL) 5 MG tablet Take 1 tablet (5 mg total) by mouth daily as needed for muscle spasms. (Patient not taking: Reported on 01/17/2018) 30 tablet 1  . dextromethorphan-guaiFENesin (MUCINEX DM) 30-600 MG 12hr tablet Take 1 tablet by mouth 2 (two) times daily as needed for cough.     . methocarbamol (ROBAXIN) 500 MG tablet Take 1 tablet (500 mg total) by mouth 4 (four)  times daily. (Patient not taking: Reported on 01/17/2018) 120 tablet 1   No current facility-administered medications for this visit.     Allergies:   Doxycycline; Lipitor [atorvastatin]; Morphine and related; Niacin and related; Ramipril; Sulfa antibiotics; and Codeine    Social History:  The patient  reports that he quit smoking about 48 years ago. His smoking use included cigarettes. He has a 6.00 pack-year smoking history. He has never used smokeless tobacco. He reports that he does not drink alcohol or use drugs.   Family History:  The patient's family history includes Cancer in his brother, cousin, father, and another family member; Colon cancer in his father; Diabetes in his paternal uncle; Heart attack (age of onset: 16) in his brother; Stroke in his father.    ROS:  Please see the history of present illness.   Otherwise, review of systems are positive for none.   All other systems are reviewed and negative.    PHYSICAL EXAM: VS:  BP 110/68   Pulse 90   Ht 6\' 2"  (1.88 m)   Wt 229 lb 9.6 oz (104.1 kg)   BMI 29.48 kg/m  , BMI Body mass index is 29.48 kg/m. GEN: Well nourished, overweight,  in no acute distress  HEENT: normal  Neck: no JVD, carotid bruits, or masses Cardiac: RRR; no murmurs, rubs, or gallops,no edema  Respiratory:  clear to auscultation bilaterally, normal work of breathing GI: soft, nontender, nondistended, + BS MS: no deformity or atrophy  Skin: warm and dry, no rash Neuro:  Strength and sensation are intact Psych: euthymic mood, full affect   EKG:  EKG is not ordered today. The ekg ordered today demonstrates N/A   Recent Labs: 01/19/2017: TSH 4.69 05/21/2017: ALT 23; BUN 17; Creatinine, Ser 1.52; Hemoglobin 14.6; Platelets 241; Potassium 5.0; Sodium 140    Lipid Panel    Component Value Date/Time   CHOL 130 01/19/2017 0800   TRIG 147.0 01/19/2017 0800   TRIG 116 01/15/2006 0828   HDL 31.20 (L) 01/19/2017 0800   CHOLHDL  4 01/19/2017 0800   VLDL  29.4 01/19/2017 0800   LDLCALC 69 01/19/2017 0800   LDLDIRECT 74.7 02/21/2009 0932      Wt Readings from Last 3 Encounters:  01/17/18 229 lb 9.6 oz (104.1 kg)  11/30/17 226 lb 1.9 oz (102.6 kg)  11/05/17 226 lb (102.5 kg)      Other studies Reviewed: Additional studies/ records that were reviewed today include:  DATE OF PROCEDURE:  06/05/2010                           CARDIAC CATHETERIZATION   HISTORY:  This is a 78 year old male with a history of coronary artery disease, hypertension, and hyperlipidemia who had worsening dyspnea and has worsening inferolateral ischemia on Cardiolite study.  PROCEDURE:  Left heart catheterization with coronary angiograms and left ventriculogram.  DESCRIPTION OF PROCEDURE:  The patient was brought to the [facility] and prepped and draped in the usual manner.  After Xylocaine anesthesia, a 4-French sheath was placed in the right femoral percutaneously using a single anterior needle wall stick.  Angiograms were made using 4-French catheters and a 30-mL ventriculogram was performed.  Following the procedure, he was taken to the holding area for further sheath removal.  He tolerated the procedure well.  HEMODYNAMIC DATA:  Aorta postcontrast 113/64.  LV postcontrast 113/6-11.  ANGIOGRAPHIC DATA:  Left ventriculogram:  Performed in the 30-degree RAO projection.  The aortic valve is normal.  The mitral valve is normal. There is mild coronary calcification noted.  Left main coronary artery has a mild 10-20% distal stenosis.  Left anterior descending has mild irregularity with 20-30% stenosis noted.  A large intermediate branch arises and contained scattered irregularities.  The circumflex is a dominant vessel with scattered irregularities.  There is a severe 80% stenosis in the distal posterior descending artery proximally.  This was then a second branch of the PDA.  There is no significant proximal disease noted.  The  right coronary artery is a nondominant vessel supplying predominantly right ventricular branches.  IMPRESSION: 1. Severe coronary artery disease involving the posterior descending     artery of a dominant circumflex. 2. Very mild left main and mild left anterior descending disease. 3. Normal left ventricular function.  RECOMMENDATIONS:  Continued medical therapy.    Ezzard Standing, M.D.  Myoview study in 2015 was normal.    ASSESSMENT AND PLAN:  1.  CAD - asymptomatic. Continue ASA and metoprolol. He is asymptomatic. Last Myoview in 2015 normal.  2. HTN with hypertensive heart disease. BP is well controlled. 3. Hypercholesterolemia. On low dose statin and fenofibrate. Will update fasting labs today. 4. Obesity.    Current medicines are reviewed at length with the patient today.  The patient does not have concerns regarding medicines.  The following changes have been made:  no change  Labs/ tests ordered today include:   Orders Placed This Encounter  Procedures  . Basic metabolic panel  . Lipid panel  . Hepatic function panel     Disposition:   FU with me in 1 year  Signed, Peter Martinique, MD  01/17/2018 9:03 AM    [facility] [address], [place], [place], [address] Phone [phone], Fax [phone]

## 2018-01-13 NOTE — Telephone Encounter (Signed)
° °  1. Which medications need to be refilled? (please list name of each medication and dose if known) Fenofibrate 160mg  tablet  2. Which pharmacy/location (including street and city if local pharmacy) is medication to be sent to?[facility] [phone]  3. Do they need a 30 day or 90 day supply? [place]

## 2018-01-17 ENCOUNTER — Encounter: Payer: Self-pay | Admitting: Cardiology

## 2018-01-17 ENCOUNTER — Ambulatory Visit: Payer: PPO | Admitting: Cardiology

## 2018-01-17 VITALS — BP 110/68 | HR 90 | Ht 74.0 in | Wt 229.6 lb

## 2018-01-17 DIAGNOSIS — E782 Mixed hyperlipidemia: Secondary | ICD-10-CM

## 2018-01-17 DIAGNOSIS — I2510 Atherosclerotic heart disease of native coronary artery without angina pectoris: Secondary | ICD-10-CM | POA: Diagnosis not present

## 2018-01-17 DIAGNOSIS — I110 Hypertensive heart disease with heart failure: Secondary | ICD-10-CM

## 2018-01-17 LAB — HEPATIC FUNCTION PANEL
ALT: 17 IU/L (ref 0–44)
AST: 19 IU/L (ref 0–40)
Albumin: 4.4 g/dL (ref 3.5–4.8)
Alkaline Phosphatase: 74 IU/L (ref 39–117)
BILIRUBIN TOTAL: 0.6 mg/dL (ref 0.0–1.2)
Bilirubin, Direct: 0.19 mg/dL (ref 0.00–0.40)
Total Protein: 7.6 g/dL (ref 6.0–8.5)

## 2018-01-17 LAB — BASIC METABOLIC PANEL
BUN/Creatinine Ratio: 13 (ref 10–24)
BUN: 18 mg/dL (ref 8–27)
CO2: 22 mmol/L (ref 20–29)
Calcium: 9.7 mg/dL (ref 8.6–10.2)
Chloride: 102 mmol/L (ref 96–106)
Creatinine, Ser: 1.38 mg/dL — ABNORMAL HIGH (ref 0.76–1.27)
GFR calc non Af Amer: 49 mL/min/{1.73_m2} — ABNORMAL LOW (ref >59)
GFR, EST AFRICAN AMERICAN: 57 mL/min/{1.73_m2} — AB (ref >59)
Glucose: 149 mg/dL — ABNORMAL HIGH (ref 65–99)
Potassium: 3.8 mmol/L (ref 3.5–5.2)
Sodium: 141 mmol/L (ref 134–144)

## 2018-01-17 LAB — LIPID PANEL
CHOL/HDL RATIO: 3.9 ratio (ref 0.0–5.0)
Cholesterol, Total: 135 mg/dL (ref 100–199)
HDL: 35 mg/dL — ABNORMAL LOW (ref >39)
LDL Calculated: 74 mg/dL (ref 0–99)
Triglycerides: 131 mg/dL (ref 0–149)
VLDL Cholesterol Cal: 26 mg/dL (ref 5–40)

## 2018-01-18 ENCOUNTER — Ambulatory Visit: Payer: PPO | Admitting: Cardiology

## 2018-01-24 ENCOUNTER — Telehealth: Payer: Self-pay | Admitting: Cardiology

## 2018-01-24 MED ORDER — ROSUVASTATIN CALCIUM 10 MG PO TABS: 10.0000 mg | ORAL_TABLET | ORAL | 6 refills | Status: DC

## 2018-01-24 NOTE — Telephone Encounter (Signed)
Refill sent to pharmacy.   

## 2018-01-24 NOTE — Telephone Encounter (Signed)
Please refill. Thanks!

## 2018-01-24 NOTE — Telephone Encounter (Signed)
° ° °  1. Which medications need to be refilled? (please list name of each medication and dose if known) Rosuvastatin 10mg  twice a week  2. Which pharmacy/location (including street and city if local pharmacy) is medication to be sent to? [facility] in [place]  3. Do they need a 30 day or 90 day supply? [place]

## 2018-02-03 DIAGNOSIS — M5431 Sciatica, right side: Secondary | ICD-10-CM | POA: Diagnosis not present

## 2018-02-03 DIAGNOSIS — M5136 Other intervertebral disc degeneration, lumbar region: Secondary | ICD-10-CM | POA: Diagnosis not present

## 2018-02-03 DIAGNOSIS — M9902 Segmental and somatic dysfunction of thoracic region: Secondary | ICD-10-CM | POA: Diagnosis not present

## 2018-02-03 DIAGNOSIS — M9903 Segmental and somatic dysfunction of lumbar region: Secondary | ICD-10-CM | POA: Diagnosis not present

## 2018-02-03 DIAGNOSIS — M9905 Segmental and somatic dysfunction of pelvic region: Secondary | ICD-10-CM | POA: Diagnosis not present

## 2018-02-03 DIAGNOSIS — M5134 Other intervertebral disc degeneration, thoracic region: Secondary | ICD-10-CM | POA: Diagnosis not present

## 2018-02-07 DIAGNOSIS — M5134 Other intervertebral disc degeneration, thoracic region: Secondary | ICD-10-CM | POA: Diagnosis not present

## 2018-02-07 DIAGNOSIS — M9905 Segmental and somatic dysfunction of pelvic region: Secondary | ICD-10-CM | POA: Diagnosis not present

## 2018-02-07 DIAGNOSIS — M9903 Segmental and somatic dysfunction of lumbar region: Secondary | ICD-10-CM | POA: Diagnosis not present

## 2018-02-07 DIAGNOSIS — M5431 Sciatica, right side: Secondary | ICD-10-CM | POA: Diagnosis not present

## 2018-02-07 DIAGNOSIS — M9902 Segmental and somatic dysfunction of thoracic region: Secondary | ICD-10-CM | POA: Diagnosis not present

## 2018-02-07 DIAGNOSIS — M5136 Other intervertebral disc degeneration, lumbar region: Secondary | ICD-10-CM | POA: Diagnosis not present

## 2018-02-08 DIAGNOSIS — M9905 Segmental and somatic dysfunction of pelvic region: Secondary | ICD-10-CM | POA: Diagnosis not present

## 2018-02-08 DIAGNOSIS — M5431 Sciatica, right side: Secondary | ICD-10-CM | POA: Diagnosis not present

## 2018-02-08 DIAGNOSIS — M9903 Segmental and somatic dysfunction of lumbar region: Secondary | ICD-10-CM | POA: Diagnosis not present

## 2018-02-08 DIAGNOSIS — M5136 Other intervertebral disc degeneration, lumbar region: Secondary | ICD-10-CM | POA: Diagnosis not present

## 2018-02-08 DIAGNOSIS — M5134 Other intervertebral disc degeneration, thoracic region: Secondary | ICD-10-CM | POA: Diagnosis not present

## 2018-02-08 DIAGNOSIS — M9902 Segmental and somatic dysfunction of thoracic region: Secondary | ICD-10-CM | POA: Diagnosis not present

## 2018-02-09 DIAGNOSIS — M5134 Other intervertebral disc degeneration, thoracic region: Secondary | ICD-10-CM | POA: Diagnosis not present

## 2018-02-09 DIAGNOSIS — M9905 Segmental and somatic dysfunction of pelvic region: Secondary | ICD-10-CM | POA: Diagnosis not present

## 2018-02-09 DIAGNOSIS — M5431 Sciatica, right side: Secondary | ICD-10-CM | POA: Diagnosis not present

## 2018-02-09 DIAGNOSIS — M9903 Segmental and somatic dysfunction of lumbar region: Secondary | ICD-10-CM | POA: Diagnosis not present

## 2018-02-09 DIAGNOSIS — M5136 Other intervertebral disc degeneration, lumbar region: Secondary | ICD-10-CM | POA: Diagnosis not present

## 2018-02-09 DIAGNOSIS — M9902 Segmental and somatic dysfunction of thoracic region: Secondary | ICD-10-CM | POA: Diagnosis not present

## 2018-02-14 DIAGNOSIS — R3912 Poor urinary stream: Secondary | ICD-10-CM | POA: Diagnosis not present

## 2018-02-14 DIAGNOSIS — Z8546 Personal history of malignant neoplasm of prostate: Secondary | ICD-10-CM | POA: Diagnosis not present

## 2018-02-14 DIAGNOSIS — N5201 Erectile dysfunction due to arterial insufficiency: Secondary | ICD-10-CM | POA: Diagnosis not present

## 2018-02-21 ENCOUNTER — Other Ambulatory Visit: Payer: Self-pay | Admitting: Cardiology

## 2018-02-21 NOTE — Telephone Encounter (Signed)
Please refill. Patient saw Dr Martinique in Jan 2020.  Thank you

## 2018-02-23 DIAGNOSIS — M9905 Segmental and somatic dysfunction of pelvic region: Secondary | ICD-10-CM | POA: Diagnosis not present

## 2018-02-23 DIAGNOSIS — M5431 Sciatica, right side: Secondary | ICD-10-CM | POA: Diagnosis not present

## 2018-02-23 DIAGNOSIS — M9902 Segmental and somatic dysfunction of thoracic region: Secondary | ICD-10-CM | POA: Diagnosis not present

## 2018-02-23 DIAGNOSIS — M5134 Other intervertebral disc degeneration, thoracic region: Secondary | ICD-10-CM | POA: Diagnosis not present

## 2018-02-23 DIAGNOSIS — M9903 Segmental and somatic dysfunction of lumbar region: Secondary | ICD-10-CM | POA: Diagnosis not present

## 2018-02-23 DIAGNOSIS — M5136 Other intervertebral disc degeneration, lumbar region: Secondary | ICD-10-CM | POA: Diagnosis not present

## 2018-02-24 DIAGNOSIS — M5134 Other intervertebral disc degeneration, thoracic region: Secondary | ICD-10-CM | POA: Diagnosis not present

## 2018-02-24 DIAGNOSIS — M9905 Segmental and somatic dysfunction of pelvic region: Secondary | ICD-10-CM | POA: Diagnosis not present

## 2018-02-24 DIAGNOSIS — M5136 Other intervertebral disc degeneration, lumbar region: Secondary | ICD-10-CM | POA: Diagnosis not present

## 2018-02-24 DIAGNOSIS — M9903 Segmental and somatic dysfunction of lumbar region: Secondary | ICD-10-CM | POA: Diagnosis not present

## 2018-02-24 DIAGNOSIS — M9902 Segmental and somatic dysfunction of thoracic region: Secondary | ICD-10-CM | POA: Diagnosis not present

## 2018-02-24 DIAGNOSIS — M5431 Sciatica, right side: Secondary | ICD-10-CM | POA: Diagnosis not present

## 2018-03-01 DIAGNOSIS — M9905 Segmental and somatic dysfunction of pelvic region: Secondary | ICD-10-CM | POA: Diagnosis not present

## 2018-03-01 DIAGNOSIS — M9902 Segmental and somatic dysfunction of thoracic region: Secondary | ICD-10-CM | POA: Diagnosis not present

## 2018-03-01 DIAGNOSIS — M5134 Other intervertebral disc degeneration, thoracic region: Secondary | ICD-10-CM | POA: Diagnosis not present

## 2018-03-01 DIAGNOSIS — M5136 Other intervertebral disc degeneration, lumbar region: Secondary | ICD-10-CM | POA: Diagnosis not present

## 2018-03-01 DIAGNOSIS — M5431 Sciatica, right side: Secondary | ICD-10-CM | POA: Diagnosis not present

## 2018-03-01 DIAGNOSIS — M9903 Segmental and somatic dysfunction of lumbar region: Secondary | ICD-10-CM | POA: Diagnosis not present

## 2018-03-02 DIAGNOSIS — M9905 Segmental and somatic dysfunction of pelvic region: Secondary | ICD-10-CM | POA: Diagnosis not present

## 2018-03-02 DIAGNOSIS — M5136 Other intervertebral disc degeneration, lumbar region: Secondary | ICD-10-CM | POA: Diagnosis not present

## 2018-03-02 DIAGNOSIS — M5431 Sciatica, right side: Secondary | ICD-10-CM | POA: Diagnosis not present

## 2018-03-02 DIAGNOSIS — M9903 Segmental and somatic dysfunction of lumbar region: Secondary | ICD-10-CM | POA: Diagnosis not present

## 2018-03-02 DIAGNOSIS — M9902 Segmental and somatic dysfunction of thoracic region: Secondary | ICD-10-CM | POA: Diagnosis not present

## 2018-03-02 DIAGNOSIS — M5134 Other intervertebral disc degeneration, thoracic region: Secondary | ICD-10-CM | POA: Diagnosis not present

## 2018-03-07 DIAGNOSIS — M5136 Other intervertebral disc degeneration, lumbar region: Secondary | ICD-10-CM | POA: Diagnosis not present

## 2018-03-07 DIAGNOSIS — M47816 Spondylosis without myelopathy or radiculopathy, lumbar region: Secondary | ICD-10-CM | POA: Diagnosis not present

## 2018-03-08 DIAGNOSIS — M5134 Other intervertebral disc degeneration, thoracic region: Secondary | ICD-10-CM | POA: Diagnosis not present

## 2018-03-08 DIAGNOSIS — M9902 Segmental and somatic dysfunction of thoracic region: Secondary | ICD-10-CM | POA: Diagnosis not present

## 2018-03-08 DIAGNOSIS — M5136 Other intervertebral disc degeneration, lumbar region: Secondary | ICD-10-CM | POA: Diagnosis not present

## 2018-03-08 DIAGNOSIS — M9905 Segmental and somatic dysfunction of pelvic region: Secondary | ICD-10-CM | POA: Diagnosis not present

## 2018-03-08 DIAGNOSIS — M5431 Sciatica, right side: Secondary | ICD-10-CM | POA: Diagnosis not present

## 2018-03-08 DIAGNOSIS — M9903 Segmental and somatic dysfunction of lumbar region: Secondary | ICD-10-CM | POA: Diagnosis not present

## 2018-03-09 DIAGNOSIS — M9905 Segmental and somatic dysfunction of pelvic region: Secondary | ICD-10-CM | POA: Diagnosis not present

## 2018-03-09 DIAGNOSIS — M5134 Other intervertebral disc degeneration, thoracic region: Secondary | ICD-10-CM | POA: Diagnosis not present

## 2018-03-09 DIAGNOSIS — M9902 Segmental and somatic dysfunction of thoracic region: Secondary | ICD-10-CM | POA: Diagnosis not present

## 2018-03-09 DIAGNOSIS — M9903 Segmental and somatic dysfunction of lumbar region: Secondary | ICD-10-CM | POA: Diagnosis not present

## 2018-03-09 DIAGNOSIS — M5136 Other intervertebral disc degeneration, lumbar region: Secondary | ICD-10-CM | POA: Diagnosis not present

## 2018-03-09 DIAGNOSIS — M5431 Sciatica, right side: Secondary | ICD-10-CM | POA: Diagnosis not present

## 2018-03-10 DIAGNOSIS — M9905 Segmental and somatic dysfunction of pelvic region: Secondary | ICD-10-CM | POA: Diagnosis not present

## 2018-03-10 DIAGNOSIS — M5136 Other intervertebral disc degeneration, lumbar region: Secondary | ICD-10-CM | POA: Diagnosis not present

## 2018-03-10 DIAGNOSIS — M5134 Other intervertebral disc degeneration, thoracic region: Secondary | ICD-10-CM | POA: Diagnosis not present

## 2018-03-10 DIAGNOSIS — M9903 Segmental and somatic dysfunction of lumbar region: Secondary | ICD-10-CM | POA: Diagnosis not present

## 2018-03-10 DIAGNOSIS — M9902 Segmental and somatic dysfunction of thoracic region: Secondary | ICD-10-CM | POA: Diagnosis not present

## 2018-03-10 DIAGNOSIS — M5431 Sciatica, right side: Secondary | ICD-10-CM | POA: Diagnosis not present

## 2018-03-14 ENCOUNTER — Other Ambulatory Visit: Payer: Self-pay | Admitting: Surgery

## 2018-03-14 DIAGNOSIS — I712 Thoracic aortic aneurysm, without rupture, unspecified: Secondary | ICD-10-CM

## 2018-03-15 DIAGNOSIS — M9903 Segmental and somatic dysfunction of lumbar region: Secondary | ICD-10-CM | POA: Diagnosis not present

## 2018-03-15 DIAGNOSIS — M5134 Other intervertebral disc degeneration, thoracic region: Secondary | ICD-10-CM | POA: Diagnosis not present

## 2018-03-15 DIAGNOSIS — M9902 Segmental and somatic dysfunction of thoracic region: Secondary | ICD-10-CM | POA: Diagnosis not present

## 2018-03-15 DIAGNOSIS — M5136 Other intervertebral disc degeneration, lumbar region: Secondary | ICD-10-CM | POA: Diagnosis not present

## 2018-03-15 DIAGNOSIS — M5431 Sciatica, right side: Secondary | ICD-10-CM | POA: Diagnosis not present

## 2018-03-15 DIAGNOSIS — M9905 Segmental and somatic dysfunction of pelvic region: Secondary | ICD-10-CM | POA: Diagnosis not present

## 2018-03-17 DIAGNOSIS — M5134 Other intervertebral disc degeneration, thoracic region: Secondary | ICD-10-CM | POA: Diagnosis not present

## 2018-03-17 DIAGNOSIS — M5136 Other intervertebral disc degeneration, lumbar region: Secondary | ICD-10-CM | POA: Diagnosis not present

## 2018-03-17 DIAGNOSIS — M9903 Segmental and somatic dysfunction of lumbar region: Secondary | ICD-10-CM | POA: Diagnosis not present

## 2018-03-17 DIAGNOSIS — M5431 Sciatica, right side: Secondary | ICD-10-CM | POA: Diagnosis not present

## 2018-03-17 DIAGNOSIS — M9905 Segmental and somatic dysfunction of pelvic region: Secondary | ICD-10-CM | POA: Diagnosis not present

## 2018-03-17 DIAGNOSIS — M9902 Segmental and somatic dysfunction of thoracic region: Secondary | ICD-10-CM | POA: Diagnosis not present

## 2018-03-22 DIAGNOSIS — M47816 Spondylosis without myelopathy or radiculopathy, lumbar region: Secondary | ICD-10-CM | POA: Diagnosis not present

## 2018-04-19 ENCOUNTER — Telehealth: Payer: Self-pay | Admitting: *Deleted

## 2018-04-19 NOTE — Telephone Encounter (Signed)
Called patient to schedule AWV, patient declined. Stated to call back to schedule when covid-19 quarantine was over. Does not virtual capability.

## 2018-05-11 ENCOUNTER — Ambulatory Visit: Payer: PPO | Admitting: Surgery

## 2018-05-11 ENCOUNTER — Other Ambulatory Visit: Payer: PPO

## 2018-05-30 ENCOUNTER — Encounter: Payer: Self-pay | Admitting: Internal Medicine

## 2018-06-16 ENCOUNTER — Other Ambulatory Visit: Payer: Self-pay

## 2018-06-16 ENCOUNTER — Ambulatory Visit (AMBULATORY_SURGERY_CENTER): Payer: Self-pay | Admitting: *Deleted

## 2018-06-16 VITALS — Ht 73.0 in | Wt 230.0 lb

## 2018-06-16 DIAGNOSIS — Z86010 Personal history of colonic polyps: Secondary | ICD-10-CM

## 2018-06-16 DIAGNOSIS — Z800 Family history of malignant neoplasm of digestive organs: Secondary | ICD-10-CM

## 2018-06-16 MED ORDER — PEG 3350-KCL-NA BICARB-NACL 420 G PO SOLR: ORAL | 0 refills | Status: DC

## 2018-06-16 NOTE — Progress Notes (Signed)
Pt's previsit is done over the phone and all paperwork (prep instructions, blank consent form to just read over, pre-procedure acknowledgement form and stamped envelope) sent to patient  No egg or soy allergy  No diet medication taken  No anesthesia trouble or intubation problems per pt  No home oxygen used or hx of sleep apnea  Registered in [place]

## 2018-06-21 ENCOUNTER — Other Ambulatory Visit: Payer: Self-pay

## 2018-06-22 ENCOUNTER — Telehealth: Payer: Self-pay | Admitting: Internal Medicine

## 2018-06-22 ENCOUNTER — Ambulatory Visit
Admission: RE | Admit: 2018-06-22 | Discharge: 2018-06-22 | Disposition: A | Discharge status: Home / Self Care | Payer: PPO | Source: Ambulatory Visit | Attending: Surgery | Admitting: Surgery

## 2018-06-22 ENCOUNTER — Encounter: Payer: Self-pay | Admitting: Surgery

## 2018-06-22 ENCOUNTER — Ambulatory Visit: Payer: PPO | Admitting: Surgery

## 2018-06-22 VITALS — BP 127/78 | HR 54 | Temp 97.4°F | Resp 16 | Ht 74.0 in | Wt 221.8 lb

## 2018-06-22 DIAGNOSIS — I712 Thoracic aortic aneurysm, without rupture, unspecified: Secondary | ICD-10-CM

## 2018-06-22 DIAGNOSIS — R918 Other nonspecific abnormal finding of lung field: Secondary | ICD-10-CM | POA: Diagnosis not present

## 2018-06-22 DIAGNOSIS — I7121 Aneurysm of the ascending aorta, without rupture: Secondary | ICD-10-CM

## 2018-06-22 NOTE — Telephone Encounter (Signed)
Noted  

## 2018-06-22 NOTE — Progress Notes (Signed)
HPI:  David Cantrell returns today for follow-up of a 4.2 to 4.3 cm fusiform ascending aortic aneurysm as well as multiple subcentimeter nodular opacities throughout the lungs that is been present for several years on CT scan.  He does have a cardiac history and been followed for years by Dr. Wynonia Lawman.  He said that he had a history of an MI in the past and had a prior cardiac catheterization but there is nothing in EPIC about that.  Since Dr. Wynonia Lawman retired he has been seen by Dr. Martinique in February 2020.  The patient was asymptomatic at that time.  He tells me that over the past couple months he has developed exertional substernal chest discomfort, shortness of breath, and fatigue which she has noticed when pushing his lawnmower or walking up hills.  This is new for him and seems to be getting worse.  He said that when he bends over to do any kind of activity he gets very short of breath and this is new.  He has no symptoms at rest.  Current Outpatient Medications  Medication Sig Dispense Refill  . acetaminophen (TYLENOL) 500 MG tablet Take 1,000 mg by mouth every 6 (six) hours as needed for mild pain.     Marland Kitchen albuterol (PROVENTIL HFA;VENTOLIN HFA) 108 (90 BASE) MCG/ACT inhaler Inhale 2 puffs into the lungs every 6 (six) hours as needed for wheezing or shortness of breath. 1 Inhaler 2  . alfuzosin (UROXATRAL) 10 MG 24 hr tablet Take 1 tablet (10 mg total) by mouth daily before supper. 30 tablet 0  . aspirin EC 81 MG tablet Take 81 mg by mouth every evening.     . brimonidine (ALPHAGAN) 0.15 % ophthalmic solution Place 1 drop into the right eye 2 (two) times daily.     Marland Kitchen dextromethorphan-guaiFENesin (MUCINEX DM) 30-600 MG 12hr tablet Take 1 tablet by mouth 2 (two) times daily as needed for cough.     . dorzolamide-timolol (COSOPT) 22.3-6.8 MG/ML ophthalmic solution Place 1 drop into the right eye 2 (two) times daily.    . fenofibrate 160 MG tablet Take 1 tablet (160 mg total) by mouth daily. 90 tablet 3   . fexofenadine (ALLEGRA) 180 MG tablet Take 180 mg by mouth daily as needed for allergies or rhinitis.    . Flaxseed, Linseed, 1300 MG CAPS Take by mouth 2 (two) times daily with a meal.    . fluticasone furoate-vilanterol (BREO ELLIPTA) 100-25 MCG/INH AEPB Inhale 1 puff into the lungs daily. (Patient taking differently: Inhale 1 puff into the lungs daily as needed (SOB). ) 1 each 0  . GARLIC PO Take 1 capsule by mouth daily.    . hydrocortisone ointment 0.5 % Apply 1 application topically 2 (two) times daily as needed for itching.     . hyoscyamine (LEVSIN SL) 0.125 MG SL tablet Place 1 tablet (0.125 mg total) under the tongue as needed. MUST HAVE OFFICE VISIT WITH DR PYRTLE FOR FURTHER REFILLS!!! 20 tablet 0  . metoprolol tartrate (LOPRESSOR) 25 MG tablet Take 1 tablet by mouth twice daily. Needs office visit for more refills. 180 tablet 0  . Multiple Vitamins-Minerals (MULTIVITAMIN ADULTS PO) Take 1 tablet by mouth daily.     Marland Kitchen NITROSTAT 0.4 MG SL tablet Place 0.4 mg under the tongue every 5 (five) minutes as needed for chest pain.     . Omega-3 Fatty Acids (FISH OIL) 1200 MG CAPS Take 1 capsule by mouth 2 (two) times daily. 1  by mouth daily    . ondansetron (ZOFRAN) 4 MG tablet Take 1 tablet (4 mg total) by mouth every 8 (eight) hours as needed. 12 tablet 0  . polyethylene glycol-electrolytes (NULYTELY/GOLYTELY) 420 g solution Golytely as directed 4000 mL 0  . predniSONE (DELTASONE) 20 MG tablet Take 2 tablets (40 mg total) by mouth daily with breakfast. 10 tablet 0  . Probiotic Product (ALIGN PO) Take by mouth daily.    . psyllium (METAMUCIL) 58.6 % packet Take 1 packet by mouth every morning.     . rosuvastatin (CRESTOR) 10 MG tablet Take 1 tablet (10 mg total) by mouth 2 (two) times a week. Monday and thursday 30 tablet 6  . shark liver oil-cocoa butter (PREPARATION H) 0.25-88.44 % suppository Place 1 suppository rectally as needed for hemorrhoids.    . tamsulosin (FLOMAX) 0.4 MG CAPS  capsule Take 1 capsule (0.4 mg total) by mouth daily. (Patient taking differently: Take 0.8 mg by mouth daily. ) 30 capsule 2   No current facility-administered medications for this visit.      Physical Exam: BP 127/78 (BP Location: Right Arm, Patient Position: Sitting, Cuff Size: Normal)   Pulse (!) 54   Temp (!) 97.4 F (36.3 C) (Skin)   Resp 16   Ht 6\' 2"  (1.88 m)   Wt 221 lb 12.8 oz (100.6 kg)   SpO2 97% Comment: RA  BMI 28.48 kg/m  He looks well. Cardiac exam shows a regular rate and rhythm with normal heart sounds.  There is no murmur. Lungs are clear. There is no peripheral edema.  Diagnostic Tests:  CLINICAL DATA:  Thoracic aortic aneurysm. Shortness of breath with exertion, emphysema. No prior surgery.  EXAM: CT CHEST WITHOUT CONTRAST  TECHNIQUE: Multidetector CT imaging of the chest was performed following the standard protocol without IV contrast.  COMPARISON:  05/21/2017  FINDINGS: Cardiovascular: Heart size normal. No pericardial effusion. Central pulmonary arteries upper limits normal in diameter. Scattered coronary calcifications. Transverse dimensions of thoracic aorta as follows:  4 cm sinuses of Valsalva  3.6 cm sino-tubular junction  4.3 cm mid ascending (stable by my  measurements since prior study)  4 cm distal ascending/proximal arch  3.4 cm distal arch/proximal descending  2.8 cm distal descending  Moderate scattered calcified plaque in the arch and descending thoracic segment. Visualized proximal abdominal aorta shows similar mild atheromatous change without dilatation.  Mediastinum/Nodes: No hilar or mediastinal adenopathy.  Lungs/Pleura: No pleural effusion. No pneumothorax. Pulmonary emphysema most evident in the upper lobes.  7 cm subpleural nodule posterior left lower lobe image 112/8  7 mm subpleural nodule posterior right lower lobe image 115/8  7 mm pleural-based nodule posteromedial right lower lobe   4 mm nodule superior segment right lower lobe  3 mm nodule lateral left upper lobe  All the above nodules were present on films dating back to 07/03/2013 and not significantly increased in size. No definite new nodule. No airspace disease. Calcified subpleural granuloma in the lateral basal segment right lower lobe.  Upper Abdomen: Right renal cysts, present since 01/02/2013. No acute findings.  Musculoskeletal: Spondylitic changes in the visualized lower cervical spine. Orthopedic hardware in the right scapula. No fracture or worrisome bone lesion.  IMPRESSION: 1. Stable 4.3 cm ascending thoracic aortic aneurysm without complicating features. Recommend annual imaging followup by CTA or MRA. This recommendation follows 2010 ACCF/AHA/AATS/ACR/ASA/SCA/SCAI/SIR/STS/SVM Guidelines for the Diagnosis and Management of Patients with Thoracic Aortic Disease. 2010; 121: E675-Q492. 2. Coronary and Aortic Atherosclerosis (ICD10-I70.0). 3. Bilateral  subcentimeter pulmonary nodules stable x5 years, presumably benign.   Electronically Signed   By: Lucrezia Europe M.D.   On: 06/22/2018 13:05   Impression:  This 78 year old gentleman has a stable 4.3 cm fusiform ascending aortic aneurysm which is still well below the 5.5 cm surgical threshold.  His blood pressure is under good control.  I recommended a follow-up CT scan in 1 year.  His pulmonary nodules have been stable for 5 years and are most likely benign and require no further follow-up.  I am concerned about his recent development of chest discomfort, fatigue, shortness of breath with exertion.  He does have significant calcifications in the distal left main, left circumflex, and LAD territories.  His current CT scan shows more calcification in the distal left main then he had 1 year ago.  I recommended that he set up an appointment to return to see Dr. Martinique as soon as possible and he probably should have a cardiac catheterization given  the degree of calcification in his left main and proximal coronary arteries.  I reviewed his CT scan images with him and answered all of his questions.  Plan:  The patient will call Dr. Doug Sou office to set up an appointment for cardiology follow-up.  I will plan to see him back in 1 year with a CTA of the chest to follow-up on his ascending aortic aneurysm.  I spent 15 minutes performing this established patient evaluation and > 50% of this time was spent face to face counseling and coordinating the care of this patient's aortic aneurysm.   Gaye Pollack, MD [facility] [phone]

## 2018-06-23 NOTE — Progress Notes (Signed)
Cardiology Office Note:    Date:  06/24/2018   ID:  David Cantrell, DOB 01-19-1940, MRN [id number]  PCP:  [name], MD  Cardiologist:  Peter Martinique, MD , previously Dr. Wynonia Lawman  Referring MD: [name], MD   Chief Complaint  Patient presents with  . Chest Pain  . Shortness of Breath    History of Present Illness:    David Cantrell is a 78 y.o. male with a hx of coronary artery disease.  He was previously followed by Dr. Wynonia Lawman.  He had a cardiac catheterization in 2012 that showed obstructive disease in the left PDA.  This was managed medically.  He also had a follow-up Myoview study in 2015 that was normal.  He also has a history of hypertension, hyperlipidemia, CKD, COPD, first degree heart block, and an sending aortic aneurysm measuring 4.2 to 4.3 cm, and followed by [name]. He established care with Dr. Martinique 01/2018.  He was seen by [name] in clinic yesterday for follow-up of his aneurysm.  At that time he described symptoms concerning for stable angina.  In addition, CT chest performed to evaluate his ascending aortic aneurysm showed possible progression of coronary calcification.  He was advised to follow-up with cardiology.  On presentation today, he describes increased fatigue with work. He reports shortness of breath and chest tightness when he is working in the yard. He has noticed radiation down his left arm. This started approximately 2 months ago, but symptoms have been worsening. He can now no longer work in the yard and can't bend over to do anything. Chest pain described as a tightness or band-like sensation. Symptoms relieve with rest, but can last nearly an hour. He also reports symptoms at rest from time to time. CT scan showed more calcificatio in the distal left main compared to one year prior, as well as calcifications in the distal left main, left Cx, and LAD.   I am concerned for progression of disease. EKG today reassuring, no acute ischemia.    Past  Medical History:  Diagnosis Date  . Allergy   . Arthritis   . Bronchiectasis ([place])    per pulmologist note -- dr Lake Bells-- related to multiple episodes pneumonia as adult  . CAD (coronary artery disease)    CARDIOLOGIST--  DR Wynonia Lawman  . Cancer ([place])    basal cell ca  . Cataract   . Centrilobular emphysema ([place])   . Congenital renal cyst   . COPD (chronic obstructive pulmonary disease) (HCC)    pulmologist-  dr Lake Bells  . Diverticulosis of colon   . Dyspnea on exertion   . Emphysema of lung ([place])   . First degree heart block   . Hemorrhoids    CURRENTLY INFLARED DUE TO RADIATION THERAPY  . History of adenomatous polyp of colon    hx multiple tubular adenoma's and hyperplastic polyp's since 2001--- last colonoscopy 05-29-2015  . History of basal cell carcinoma excision    10-31-2012  nose  . History of external beam radiation therapy 02-01-2016  to 03-16-2016   prostate 45Gy  plus radioactive prostate seed implants 04-07-2016  . History of kidney stones   . Hyperlipidemia   . Hypertension   . Multiple pulmonary nodules    S/P  BILATERAL BRONCHOSCOPY  09-24-2014  no infection --    . Open-angle glaucoma    RIGHT EYE ONLY/OPEN ANGLE  . Prostate cancer [facility]) urologist-  dr eskridge/  oncologist-  dr Tammi Klippel  Stage T1c,  Gleason3+4, PSA 4.7--  s/p  external beam radiation therapy 02-01-2016 to 03-16-2016  . Seasonal allergies   . Thoracic aortic aneurysm ([place])    monitored by dr [name] (vascular surgeon)--- 10-09-2015 last CT 4.2cm, stable  . Wears dentures    full upper and lower parital    Past Surgical History:  Procedure Laterality Date  . CARDIAC CATHETERIZATION  09-04-2002  DR Wynonia Lawman   NORMAL LVF/  MILD TO MODERATE CAD INVOLVING PROXIMAL 40%  AND MID 40%  LAD/  POSTERIOR DESCENDING CX 70%  . CARDIAC CATHETERIZATION  06-05-2010  DR Fidela Juneau   NORMAL LM/  40% PROXIMAL MID LAD/ 70% DISTAL CFX/  80% PROXIMAL PDA/  SMALL & NONDOMINANT RCA  . CARDIOVASCULAR STRESS TEST   06-01-2013   dr Wynonia Lawman   normal lexiscan myoview w/ no ischemia/ normal LV function and wall motion, ef 61%  . COLONOSCOPY    . COLONOSCOPY W/ POLYPECTOMY  last one 05-29-2015  . CYSTOSCOPY N/A 04/07/2016   Procedure: CYSTOSCOPY FLEXIBLE;  Surgeon: Festus Aloe, MD;  Location: [facility];  Service: Urology;  Laterality: N/A;  no seeds found in bladder  . CYSTOSCOPY WITH RETROGRADE PYELOGRAM, URETEROSCOPY AND STENT PLACEMENT Right 02/02/2013   Procedure: CYSTOSCOPY WITH RETROGRADE PYELOGRAM, AND Right STENT PLACEMENT;  Surgeon: Molli Hazard, MD;  Location: [facility];  Service: Urology;  Laterality: Right;  . CYSTOSCOPY WITH RETROGRADE PYELOGRAM, URETEROSCOPY AND STENT PLACEMENT Right 02/15/2013   Procedure: CYSTOSCOPY WITH RETROGRADE PYELOGRAM, URETEROSCOPY AND STENT EXCHANGE;  Surgeon: Molli Hazard, MD;  Location: [facility];  Service: Urology;  Laterality: Right;  . ESI     ; for cervical radiculopathy  . HEMORRHOID SURGERY  07-03-1999  . HOLMIUM LASER APPLICATION Right 08/12/1912   Procedure: HOLMIUM LASER APPLICATION;  Surgeon: Molli Hazard, MD;  Location: [facility];  Service: Urology;  Laterality: Right;  . PROSTATE BIOPSY    . RADIOACTIVE SEED IMPLANT N/A 04/07/2016   Procedure: RADIOACTIVE SEED IMPLANT/BRACHYTHERAPY IMPLANT;  Surgeon: Festus Aloe, MD;  Location: [facility];  Service: Urology;  Laterality: N/A;  55 seeds implanted  . SHOULDER OPEN ROTATOR CUFF REPAIR Bilateral RIGHT X2  LAST ONE 1997/  LEFT 2005   right inferior glenoid screw retained  . VIDEO BRONCHOSCOPY Bilateral 09/24/2014   Procedure: VIDEO BRONCHOSCOPY WITH FLUORO;  Surgeon: Juanito Doom, MD;  Location: [facility];  Service: Cardiopulmonary;  Laterality: Bilateral;    Current Medications: Current Meds  Medication Sig  . acetaminophen (TYLENOL) 500 MG tablet Take 1,000 mg by mouth every 6 (six) hours as needed for  mild pain.   Marland Kitchen albuterol (PROVENTIL HFA;VENTOLIN HFA) 108 (90 BASE) MCG/ACT inhaler Inhale 2 puffs into the lungs every 6 (six) hours as needed for wheezing or shortness of breath.  . alfuzosin (UROXATRAL) 10 MG 24 hr tablet Take 1 tablet (10 mg total) by mouth daily before supper.  Marland Kitchen aspirin EC 81 MG tablet Take 81 mg by mouth every evening.   . brimonidine (ALPHAGAN) 0.15 % ophthalmic solution Place 1 drop into the right eye 2 (two) times daily.   . cyclobenzaprine (FLEXERIL) 5 MG tablet Take 5 mg by mouth daily as needed for muscle spasms.  Marland Kitchen dextromethorphan-guaiFENesin (MUCINEX DM) 30-600 MG 12hr tablet Take 1 tablet by mouth 2 (two) times daily as needed for cough.   . dorzolamide-timolol (COSOPT) 22.3-6.8 MG/ML ophthalmic solution Place 1 drop into the right eye 2 (two) times daily.  Marland Kitchen  fenofibrate 160 MG tablet Take 1 tablet (160 mg total) by mouth daily.  . fexofenadine (ALLEGRA) 180 MG tablet Take 180 mg by mouth daily as needed for allergies or rhinitis.  . Flaxseed, Linseed, 1300 MG CAPS Take by mouth 2 (two) times daily with a meal.  . GARLIC PO Take 1 capsule by mouth daily.  . hydrocortisone ointment 0.5 % Apply 1 application topically 2 (two) times daily as needed for itching.   . hyoscyamine (LEVSIN SL) 0.125 MG SL tablet Place 1 tablet (0.125 mg total) under the tongue as needed. MUST HAVE OFFICE VISIT WITH DR PYRTLE FOR FURTHER REFILLS!!!  . metoprolol tartrate (LOPRESSOR) 25 MG tablet Take 1 tablet by mouth twice daily. Needs office visit for more refills.  . Multiple Vitamins-Minerals (MULTIVITAMIN ADULTS PO) Take 1 tablet by mouth daily.   Marland Kitchen NITROSTAT 0.4 MG SL tablet Place 0.4 mg under the tongue every 5 (five) minutes as needed for chest pain.   . Omega-3 Fatty Acids (FISH OIL) 1200 MG CAPS Take 1 capsule by mouth 2 (two) times daily. 1 by mouth daily  . ondansetron (ZOFRAN) 4 MG tablet Take 1 tablet (4 mg total) by mouth every 8 (eight) hours as needed.  . polyethylene  glycol-electrolytes (NULYTELY/GOLYTELY) 420 g solution Golytely as directed  . Probiotic Product (ALIGN PO) Take by mouth daily.  . psyllium (METAMUCIL) 58.6 % packet Take 1 packet by mouth every morning.   . rosuvastatin (CRESTOR) 10 MG tablet Take 1 tablet (10 mg total) by mouth 2 (two) times a week. Monday and thursday  . shark liver oil-cocoa butter (PREPARATION H) 0.25-88.44 % suppository Place 1 suppository rectally as needed for hemorrhoids.  . tamsulosin (FLOMAX) 0.4 MG CAPS capsule Take 1 capsule (0.4 mg total) by mouth daily. (Patient taking differently: Take 0.8 mg by mouth daily. )     Allergies:   Doxycycline, Lipitor [atorvastatin], Morphine and related, Niacin and related, Ramipril, Sulfa antibiotics, and Codeine   Social History   Socioeconomic History  . Marital status: Married    Spouse name: Not on file  . Number of children: 3  . Years of education: Not on file  . Highest education level: Not on file  Occupational History  . Not on file  Social Needs  . Financial resource strain: Not on file  . Food insecurity    Worry: Not on file    Inability: Not on file  . Transportation needs    Medical: Not on file    Non-medical: Not on file  Tobacco Use  . Smoking status: Former Smoker    Packs/day: 2.00    Years: 3.00    Pack years: 6.00    Types: Cigarettes    Quit date: 01/12/1970    Years since quitting: 48.4  . Smokeless tobacco: Never Used  Substance and Sexual Activity  . Alcohol use: No    Alcohol/week: 0.0 standard drinks  . Drug use: No  . Sexual activity: Not on file  Lifestyle  . Physical activity    Days per week: Not on file    Minutes per session: Not on file  . Stress: Not on file  Relationships  . Social Herbalist on phone: Not on file    Gets together: Not on file    Attends religious service: Not on file    Active member of club or organization: Not on file    Attends meetings of clubs or organizations: Not on file  Relationship status: Not on file  Other Topics Concern  . Not on file  Social History Narrative  . Not on file     Family History: The patient's family history includes Cancer in his brother, cousin, father, and another family member; Colon cancer in his father; Diabetes in his paternal uncle; Heart attack (age of onset: 43) in his brother; Stroke in his father. There is no history of Arthritis, Asthma, COPD, Rectal cancer, Stomach cancer, or Esophageal cancer.  ROS:   Please see the history of present illness.     All other systems reviewed and are negative.  EKGs/Labs/Other Studies Reviewed:    The following studies were reviewed today:  DATE OF PROCEDURE: 06/05/2010 CARDIAC CATHETERIZATION  HISTORY: This is a 78 year old male with a history of coronary artery disease, hypertension, and hyperlipidemia who had worsening dyspnea and has worsening inferolateral ischemia on Cardiolite study.  PROCEDURE: Left heart catheterization with coronary angiograms and left ventriculogram.  HEMODYNAMIC DATA: Aorta postcontrast 113/64. LV postcontrast 113/6-11.  ANGIOGRAPHIC DATA: Left ventriculogram: Performed in the 30-degree RAO projection. The aortic valve is normal. The mitral valve is normal. There is mild coronary calcification noted. Left main coronary artery has a mild 10-20% distal stenosis. Left anterior descending has mild irregularity with 20-30% stenosis noted. A large intermediate branch arises and contained scattered irregularities. The circumflex is a dominant vessel with scattered irregularities. There is a severe 80% stenosis in the distal posterior descending artery proximally. This was then a second branch of the PDA. There is no significant proximal disease noted. The right coronary artery is a nondominant vessel supplying predominantly right ventricular branches.  IMPRESSION: 1. Severe coronary artery disease involving the posterior  descending artery of a dominant circumflex. 2. Very mild left main and mild left anterior descending disease. 3. Normal left ventricular function.  RECOMMENDATIONS: Continued medical therapy. Ezzard Standing, M.D.    EKG:  EKG is ordered today.  The ekg ordered today demonstrates sinus rhythm, heart rate 66, first degree heart block, no signs of acute ischemia  Recent Labs: 01/17/2018: ALT 17; BUN 18; Creatinine, Ser 1.38; Potassium 3.8; Sodium 141  Recent Lipid Panel    Component Value Date/Time   CHOL 135 01/17/2018 0906   TRIG 131 01/17/2018 0906   TRIG 116 01/15/2006 0828   HDL 35 (L) 01/17/2018 0906   CHOLHDL 3.9 01/17/2018 0906   CHOLHDL 4 01/19/2017 0800   VLDL 29.4 01/19/2017 0800   LDLCALC 74 01/17/2018 0906   LDLDIRECT 74.7 02/21/2009 0932    Physical Exam:    VS:  BP 125/72   Pulse 68   Temp (!) 97.2 F (36.2 C)   Ht 6\' 1"  (1.854 m)   Wt 222 lb 3.2 oz (100.8 kg)   SpO2 98%   BMI 29.32 kg/m     Wt Readings from Last 3 Encounters:  06/24/18 222 lb 3.2 oz (100.8 kg)  06/22/18 221 lb 12.8 oz (100.6 kg)  06/16/18 230 lb (104.3 kg)     GEN: Well nourished, well developed in no acute distress HEENT: Normal NECK: No JVD; No carotid bruits CARDIAC: RRR, no murmurs, rubs, gallops RESPIRATORY:  Clear to auscultation without rales, wheezing or rhonchi  ABDOMEN: Soft, non-tender, non-distended MUSCULOSKELETAL:  No edema; No deformity  SKIN: Warm and dry NEUROLOGIC:  Alert and oriented x 3 PSYCHIATRIC:  Normal affect   ASSESSMENT:    1. Atherosclerosis of native coronary artery of native heart without angina pectoris   2. Unstable angina (HCC)  3. CKD (chronic kidney disease), stage II   4. Essential hypertension   5. Thoracic aortic aneurysm without rupture ([place])   6. Other hyperlipidemia    PLAN:    In order of problems listed above:  Atherosclerosis of native coronary artery of native heart without angina pectoris  David Cantrell describes  symptoms concerning for unstable angina. He started having chest pressure described as a band-like sensation across his chest about 2 months ago. CP occurred when he was active in his yard and resolved with rest. He now experiences CP at rest, that can last up to an hour. Associated symptoms include SOB and fatigue. This is a difficult situation given his renal function, although he had contrast last year and recovered to his baseline. In consultation with Dr. Gwenlyn Found, I think the benefits of heart cath outweigh the risks. I will collect a BMP today and will plan to admit the night before heart cath for IV hydration. Plan for heart cath with Dr. Martinique next week.  The patient understands that risks included but are not limited to stroke (1 in 1000), death (1 in 32), kidney failure [usually temporary] (1 in 500), bleeding (1 in 200), allergic reaction [possibly serious] (1 in 200).  The patient understands and agrees to proceed.    Chronic kidney disease (CKD), active medical management without dialysis, stage 3 (moderate) (HCC)  Described risks of heart cath to his kidneys, including possible need for HD. Will collect BMP today. He understands we will admit the night before for IV hydration.    Essential hypertension Continue current medications.    Thoracic aortic aneurysm without rupture (HCC)  - stable. Followed by [name].    Other hyperlipidemia - 01/17/2018: Cholesterol, Total 135; HDL 35; LDL Calculated 74; Triglycerides 131 - taking 10 mg crestor twice weekly   I will forward my note to Dr. Martinique. Patient's wife would like Dr. Martinique to callback to discuss risks to kidneys.   Medication Adjustments/Labs and Tests Ordered: Current medicines are reviewed at length with the patient today.  Concerns regarding medicines are outlined above.  Orders Placed This Encounter  Procedures  . Basic Metabolic Panel (BMET)  . CBC with Differential  . EKG 12-Lead   No orders of the defined  types were placed in this encounter.   Signed, Ledora Bottcher, [place]  06/24/2018 11:03 AM    [facility]

## 2018-06-23 NOTE — Telephone Encounter (Signed)
Noted JMP  

## 2018-06-24 ENCOUNTER — Telehealth: Payer: Self-pay | Admitting: Physician Assistant

## 2018-06-24 ENCOUNTER — Other Ambulatory Visit: Payer: Self-pay

## 2018-06-24 ENCOUNTER — Other Ambulatory Visit (HOSPITAL_COMMUNITY)
Admission: RE | Admit: 2018-06-24 | Discharge: 2018-06-24 | Disposition: A | Discharge status: Home / Self Care | Payer: PPO | Source: Ambulatory Visit | Attending: Cardiology | Admitting: Cardiology

## 2018-06-24 ENCOUNTER — Encounter: Payer: Self-pay | Admitting: Physician Assistant

## 2018-06-24 ENCOUNTER — Ambulatory Visit (INDEPENDENT_AMBULATORY_CARE_PROVIDER_SITE_OTHER): Payer: PPO | Admitting: Physician Assistant

## 2018-06-24 VITALS — BP 125/72 | HR 68 | Temp 97.2°F | Ht 73.0 in | Wt 222.2 lb

## 2018-06-24 DIAGNOSIS — Z01812 Encounter for preprocedural laboratory examination: Secondary | ICD-10-CM | POA: Diagnosis not present

## 2018-06-24 DIAGNOSIS — Z1159 Encounter for screening for other viral diseases: Secondary | ICD-10-CM | POA: Insufficient documentation

## 2018-06-24 DIAGNOSIS — E7849 Other hyperlipidemia: Secondary | ICD-10-CM

## 2018-06-24 DIAGNOSIS — I200 Unstable angina: Secondary | ICD-10-CM | POA: Diagnosis not present

## 2018-06-24 DIAGNOSIS — N182 Chronic kidney disease, stage 2 (mild): Secondary | ICD-10-CM | POA: Diagnosis not present

## 2018-06-24 DIAGNOSIS — I712 Thoracic aortic aneurysm, without rupture, unspecified: Secondary | ICD-10-CM

## 2018-06-24 DIAGNOSIS — I2510 Atherosclerotic heart disease of native coronary artery without angina pectoris: Secondary | ICD-10-CM | POA: Diagnosis not present

## 2018-06-24 DIAGNOSIS — I10 Essential (primary) hypertension: Secondary | ICD-10-CM | POA: Diagnosis not present

## 2018-06-24 DIAGNOSIS — N183 Chronic kidney disease, stage 3 unspecified: Secondary | ICD-10-CM | POA: Insufficient documentation

## 2018-06-24 LAB — CBC WITH DIFFERENTIAL/PLATELET
Basophils Absolute: 0.1 10*3/uL (ref 0.0–0.2)
Basos: 1 %
EOS (ABSOLUTE): 0.2 10*3/uL (ref 0.0–0.4)
Eos: 3 %
Hematocrit: 39.6 % (ref 37.5–51.0)
Hemoglobin: 13.4 g/dL (ref 13.0–17.7)
Immature Grans (Abs): 0 10*3/uL (ref 0.0–0.1)
Immature Granulocytes: 0 %
Lymphocytes Absolute: 1.9 10*3/uL (ref 0.7–3.1)
Lymphs: 28 %
MCH: 32.1 pg (ref 26.6–33.0)
MCHC: 33.8 g/dL (ref 31.5–35.7)
MCV: 95 fL (ref 79–97)
Monocytes Absolute: 0.9 10*3/uL (ref 0.1–0.9)
Monocytes: 14 %
Neutrophils Absolute: 3.7 10*3/uL (ref 1.4–7.0)
Neutrophils: 54 %
Platelets: 258 10*3/uL (ref 150–450)
RBC: 4.18 x10E6/uL (ref 4.14–5.80)
RDW: 12.1 % (ref 11.6–15.4)
WBC: 6.8 10*3/uL (ref 3.4–10.8)

## 2018-06-24 LAB — BASIC METABOLIC PANEL
BUN/Creatinine Ratio: 12 (ref 10–24)
BUN: 15 mg/dL (ref 8–27)
CO2: 24 mmol/L (ref 20–29)
Calcium: 9.6 mg/dL (ref 8.6–10.2)
Chloride: 105 mmol/L (ref 96–106)
Creatinine, Ser: 1.29 mg/dL — ABNORMAL HIGH (ref 0.76–1.27)
GFR calc Af Amer: 62 mL/min/{1.73_m2} (ref >59)
GFR calc non Af Amer: 53 mL/min/{1.73_m2} — ABNORMAL LOW (ref >59)
Glucose: 104 mg/dL — ABNORMAL HIGH (ref 65–99)
Potassium: 4.5 mmol/L (ref 3.5–5.2)
Sodium: 139 mmol/L (ref 134–144)

## 2018-06-24 NOTE — Patient Instructions (Signed)
Medication Instructions:  Your physician recommends that you continue on your current medications as directed. Please refer to the Current Medication list given to you today. If you need a refill on your cardiac medications before your next appointment, please call your pharmacy.   Lab work: Your physician recommends that you return for lab work in: TODAY-BMET, CBC If you have labs (blood work) drawn today and your tests are completely normal, you will receive your results only by: Marland Kitchen MyChart Message (if you have MyChart) OR . A paper copy in the mail If you have any lab test that is abnormal or we need to change your treatment, we will call you to review the results.  Testing/Procedures: Your physician has requested that you have a cardiac catheterization. Cardiac catheterization is used to diagnose and/or treat various heart conditions. Doctors may recommend this procedure for a number of different reasons. The most common reason is to evaluate chest pain. Chest pain can be a symptom of coronary artery disease (CAD), and cardiac catheterization can show whether plaque is narrowing or blocking your heart's arteries. This procedure is also used to evaluate the valves, as well as measure the blood flow and oxygen levels in different parts of your heart. For further information please visit [email or web address][email or web address]. Please follow instruction sheet, as given. INSTRUCTIONS BELOW  COVID -77 TEST APPT IS TODAY AT 2:55PM AT [place]: At [facility], you and your health needs are our priority.  As part of our continuing mission to provide you with exceptional heart care, we have created designated Provider Care Teams.  These Care Teams include your primary Cardiologist (physician) and Advanced Practice Providers (APPs -  Physician Assistants and Nurse Practitioners) who all work together to provide you with the care you need, when you need it. You will need a follow up appointment in  10 days.  APPOINTMENT SHOULD BE MADE BEFORE YOU [place].  You may see Peter Martinique, MD or one of the following Advanced Practice Providers on your designated Care Team: [place], [place] . Fabian Sharp, PA-C  Any Other Special Instructions Will Be Listed Below (If Applicable).       [place] [address] Dept: [phone] Loc: [place]  06/24/2018  You are scheduled for a Cardiac Catheterization on Tuesday, June 16 with Dr. Peter Martinique.  1. Please arrive at the [facility] (Main Entrance A) at [facility]: [address] [place], [place] [address] at 8:30 AM (This time is two hours before your procedure to ensure your preparation). Free valet parking service is available.   Special note: Every effort is made to have your procedure done on time. Please understand that emergencies sometimes delay scheduled procedures.  2. Diet: Do not eat solid foods after midnight.  The patient may have clear liquids until 5am upon the day of the procedure.  3. Labs: You will need to have blood drawn on Friday, June 12 at [place]  Open: [place] (Lunch 12:30 - 1:30)   Phone: [phone]. You do not need to be fasting.  4. Medication instructions in preparation for your procedure:   Contrast Allergy: No  On the morning of your procedure, take your Aspirin and any morning medicines NOT listed above.  You may use sips of water.  5. Plan for one night stay--bring personal belongings.  6. Bring a current list of your medications and current insurance cards. 7. You MUST have a responsible person to drive you home. 8. Someone MUST be with you the first 24 hours after you arrive home or your discharge will be delayed. 9. Please wear clothes that are easy to get on and off and wear slip-on shoes.  Thank you for  allowing [place] to care for you!   -- [place] [facility]

## 2018-06-24 NOTE — Addendum Note (Signed)
Addended by: Minette Brine on: 06/24/2018 11:27 AM   Modules accepted: Orders

## 2018-06-25 LAB — NOVEL CORONAVIRUS, NAA (HOSP ORDER, SEND-OUT TO REF LAB; TAT 18-24 HRS): SARS-CoV-2, NAA: NOT DETECTED

## 2018-06-27 ENCOUNTER — Telehealth: Payer: Self-pay | Admitting: Cardiology

## 2018-06-27 ENCOUNTER — Other Ambulatory Visit: Payer: Self-pay

## 2018-06-27 ENCOUNTER — Encounter (HOSPITAL_COMMUNITY): Payer: Self-pay | Admitting: Physician Assistant

## 2018-06-27 ENCOUNTER — Observation Stay (HOSPITAL_COMMUNITY)
Admission: AD | Admit: 2018-06-27 | Discharge: 2018-06-28 | Disposition: A | Discharge status: Home / Self Care | Payer: PPO | Source: Ambulatory Visit | Attending: Cardiology | Admitting: Cardiology

## 2018-06-27 DIAGNOSIS — I200 Unstable angina: Secondary | ICD-10-CM

## 2018-06-27 DIAGNOSIS — R0789 Other chest pain: Secondary | ICD-10-CM | POA: Diagnosis not present

## 2018-06-27 DIAGNOSIS — Z7982 Long term (current) use of aspirin: Secondary | ICD-10-CM | POA: Insufficient documentation

## 2018-06-27 DIAGNOSIS — Z881 Allergy status to other antibiotic agents status: Secondary | ICD-10-CM | POA: Insufficient documentation

## 2018-06-27 DIAGNOSIS — Z885 Allergy status to narcotic agent status: Secondary | ICD-10-CM | POA: Insufficient documentation

## 2018-06-27 DIAGNOSIS — Z87891 Personal history of nicotine dependence: Secondary | ICD-10-CM | POA: Diagnosis not present

## 2018-06-27 DIAGNOSIS — I129 Hypertensive chronic kidney disease with stage 1 through stage 4 chronic kidney disease, or unspecified chronic kidney disease: Secondary | ICD-10-CM | POA: Insufficient documentation

## 2018-06-27 DIAGNOSIS — J449 Chronic obstructive pulmonary disease, unspecified: Secondary | ICD-10-CM | POA: Diagnosis not present

## 2018-06-27 DIAGNOSIS — Z8546 Personal history of malignant neoplasm of prostate: Secondary | ICD-10-CM | POA: Diagnosis not present

## 2018-06-27 DIAGNOSIS — R001 Bradycardia, unspecified: Secondary | ICD-10-CM

## 2018-06-27 DIAGNOSIS — N183 Chronic kidney disease, stage 3 unspecified: Secondary | ICD-10-CM

## 2018-06-27 DIAGNOSIS — E785 Hyperlipidemia, unspecified: Secondary | ICD-10-CM | POA: Diagnosis not present

## 2018-06-27 DIAGNOSIS — I2510 Atherosclerotic heart disease of native coronary artery without angina pectoris: Secondary | ICD-10-CM | POA: Diagnosis not present

## 2018-06-27 DIAGNOSIS — Z882 Allergy status to sulfonamides status: Secondary | ICD-10-CM | POA: Diagnosis not present

## 2018-06-27 DIAGNOSIS — E7800 Pure hypercholesterolemia, unspecified: Secondary | ICD-10-CM | POA: Diagnosis not present

## 2018-06-27 DIAGNOSIS — I712 Thoracic aortic aneurysm, without rupture: Secondary | ICD-10-CM | POA: Diagnosis not present

## 2018-06-27 DIAGNOSIS — Z79899 Other long term (current) drug therapy: Secondary | ICD-10-CM | POA: Insufficient documentation

## 2018-06-27 DIAGNOSIS — I25110 Atherosclerotic heart disease of native coronary artery with unstable angina pectoris: Principal | ICD-10-CM | POA: Insufficient documentation

## 2018-06-27 DIAGNOSIS — I2583 Coronary atherosclerosis due to lipid rich plaque: Secondary | ICD-10-CM | POA: Diagnosis not present

## 2018-06-27 DIAGNOSIS — I10 Essential (primary) hypertension: Secondary | ICD-10-CM

## 2018-06-27 DIAGNOSIS — R350 Frequency of micturition: Secondary | ICD-10-CM

## 2018-06-27 HISTORY — DX: Aortic ectasia, unspecified site: I77.819

## 2018-06-27 MED ORDER — SODIUM CHLORIDE 0.9 % IV SOLN: 250.0000 mL | INTRAVENOUS | Status: DC | PRN

## 2018-06-27 MED ORDER — ONDANSETRON HCL 4 MG/2ML IJ SOLN: 4.0000 mg | Freq: Four times a day (QID) | INTRAMUSCULAR | Status: DC | PRN

## 2018-06-27 MED ORDER — ASPIRIN 81 MG PO CHEW: 81.0000 mg | CHEWABLE_TABLET | ORAL | Status: DC

## 2018-06-27 MED ORDER — ASPIRIN 81 MG PO CHEW
81.0000 mg | CHEWABLE_TABLET | ORAL | Status: AC
  Administered 2018-06-28: 81 mg via ORAL
  Filled 2018-06-27: qty 1

## 2018-06-27 MED ORDER — SODIUM CHLORIDE 0.9% FLUSH: 3.0000 mL | INTRAVENOUS | Status: DC | PRN

## 2018-06-27 MED ORDER — TAMSULOSIN HCL 0.4 MG PO CAPS
0.8000 mg | ORAL_CAPSULE | Freq: Every day | ORAL | Status: DC
  Administered 2018-06-28: 0.8 mg via ORAL
  Filled 2018-06-27: qty 2

## 2018-06-27 MED ORDER — DORZOLAMIDE HCL-TIMOLOL MAL 2-0.5 % OP SOLN
1.0000 [drp] | Freq: Two times a day (BID) | OPHTHALMIC | Status: DC
  Administered 2018-06-27 – 2018-06-28 (×2): 1 [drp] via OPHTHALMIC
  Filled 2018-06-27: qty 10

## 2018-06-27 MED ORDER — ASPIRIN EC 81 MG PO TBEC
81.0000 mg | DELAYED_RELEASE_TABLET | Freq: Every day | ORAL | Status: DC
  Filled 2018-06-27: qty 1

## 2018-06-27 MED ORDER — FENOFIBRATE 160 MG PO TABS
160.0000 mg | ORAL_TABLET | Freq: Every day | ORAL | Status: DC
  Administered 2018-06-28: 160 mg via ORAL
  Filled 2018-06-27: qty 1

## 2018-06-27 MED ORDER — SODIUM CHLORIDE 0.9 % WEIGHT BASED INFUSION: 1.0000 mL/kg/h | INTRAVENOUS | Status: DC

## 2018-06-27 MED ORDER — FISH OIL 1200 MG PO CAPS: 1.0000 | ORAL_CAPSULE | Freq: Two times a day (BID) | ORAL | Status: DC

## 2018-06-27 MED ORDER — ADULT MULTIVITAMIN W/MINERALS CH
1.0000 | ORAL_TABLET | Freq: Every day | ORAL | Status: DC
  Administered 2018-06-28: 1 via ORAL
  Filled 2018-06-27: qty 1

## 2018-06-27 MED ORDER — CYCLOBENZAPRINE HCL 5 MG PO TABS: 5.0000 mg | ORAL_TABLET | Freq: Every day | ORAL | Status: DC | PRN

## 2018-06-27 MED ORDER — ROSUVASTATIN CALCIUM 5 MG PO TABS: 10.0000 mg | ORAL_TABLET | ORAL | Status: DC

## 2018-06-27 MED ORDER — HYOSCYAMINE SULFATE 0.125 MG SL SUBL
0.1250 mg | SUBLINGUAL_TABLET | Freq: Every day | SUBLINGUAL | Status: DC | PRN
  Filled 2018-06-27: qty 1

## 2018-06-27 MED ORDER — ALBUTEROL SULFATE (2.5 MG/3ML) 0.083% IN NEBU: 3.0000 mL | INHALATION_SOLUTION | Freq: Four times a day (QID) | RESPIRATORY_TRACT | Status: DC | PRN

## 2018-06-27 MED ORDER — PSYLLIUM 95 % PO PACK
1.0000 | PACK | ORAL | Status: DC
  Filled 2018-06-27: qty 1

## 2018-06-27 MED ORDER — SODIUM CHLORIDE 0.9 % WEIGHT BASED INFUSION
3.0000 mL/kg/h | INTRAVENOUS | Status: AC
  Administered 2018-06-27: 3 mL/kg/h via INTRAVENOUS

## 2018-06-27 MED ORDER — SODIUM CHLORIDE 0.9% FLUSH
3.0000 mL | Freq: Two times a day (BID) | INTRAVENOUS | Status: DC
  Administered 2018-06-27: 3 mL via INTRAVENOUS

## 2018-06-27 MED ORDER — SODIUM CHLORIDE 0.9% FLUSH
3.0000 mL | Freq: Two times a day (BID) | INTRAVENOUS | Status: DC
  Administered 2018-06-27 – 2018-06-28 (×2): 3 mL via INTRAVENOUS

## 2018-06-27 MED ORDER — BRIMONIDINE TARTRATE 0.15 % OP SOLN
1.0000 [drp] | Freq: Two times a day (BID) | OPHTHALMIC | Status: DC
  Administered 2018-06-27 – 2018-06-28 (×2): 1 [drp] via OPHTHALMIC
  Filled 2018-06-27: qty 5

## 2018-06-27 MED ORDER — SODIUM CHLORIDE 0.9% FLUSH: 3.0000 mL | Freq: Two times a day (BID) | INTRAVENOUS | Status: DC

## 2018-06-27 MED ORDER — HEPARIN SODIUM (PORCINE) 5000 UNIT/ML IJ SOLN
5000.0000 [IU] | Freq: Three times a day (TID) | INTRAMUSCULAR | Status: DC
  Administered 2018-06-27 – 2018-06-28 (×4): 5000 [IU] via SUBCUTANEOUS
  Filled 2018-06-27 (×4): qty 1

## 2018-06-27 MED ORDER — ACETAMINOPHEN 325 MG PO TABS
650.0000 mg | ORAL_TABLET | ORAL | Status: DC | PRN
  Administered 2018-06-27: 650 mg via ORAL
  Filled 2018-06-27: qty 2

## 2018-06-27 MED ORDER — RISAQUAD PO CAPS
1.0000 | ORAL_CAPSULE | Freq: Every day | ORAL | Status: DC
  Administered 2018-06-28: 1 via ORAL
  Filled 2018-06-27: qty 1

## 2018-06-27 MED ORDER — SODIUM CHLORIDE 0.9 % WEIGHT BASED INFUSION: 3.0000 mL/kg/h | INTRAVENOUS | Status: DC

## 2018-06-27 MED ORDER — SODIUM CHLORIDE 0.9 % WEIGHT BASED INFUSION
1.0000 mL/kg/h | INTRAVENOUS | Status: DC
  Administered 2018-06-27: 1 mL/kg/h via INTRAVENOUS

## 2018-06-27 MED ORDER — NITROGLYCERIN 0.4 MG SL SUBL: 0.4000 mg | SUBLINGUAL_TABLET | SUBLINGUAL | Status: DC | PRN

## 2018-06-27 MED ORDER — METOPROLOL TARTRATE 25 MG PO TABS
25.0000 mg | ORAL_TABLET | Freq: Two times a day (BID) | ORAL | Status: DC
  Administered 2018-06-27: 25 mg via ORAL
  Filled 2018-06-27 (×2): qty 1

## 2018-06-27 MED ORDER — LORATADINE 10 MG PO TABS: 10.0000 mg | ORAL_TABLET | Freq: Every day | ORAL | Status: DC | PRN

## 2018-06-27 NOTE — Telephone Encounter (Signed)
New message:        Patient calling concerning his up coming appt. Patient would like for some one to call concering this matter. Please call patient.

## 2018-06-27 NOTE — Telephone Encounter (Signed)
The patient called in asking if he was supposed to be admitted today for his cardiac catheterization tomorrow with Dr. Martinique. Per the office notes, the patient should be admitted the day prior for hydration.  Bed control has been called. The patient will be admitted to room 3 east 14. The patient has been advised that he cannot have visitors right now and to wear a mask. He will go to the main entrance in a few hours.      COVID-19 Pre-Screening Questions:  . In the past 7 to 10 days have you had a cough,  shortness of breath, headache, congestion, fever (100 or greater) body aches, chills, sore throat, or sudden loss of taste or sense of smell? No . Have you been around anyone with known Covid 19. No  . Have you been around anyone who is awaiting Covid 19 test results in the past 7 to 10 days? No . Have you been around anyone who has been exposed to Covid 19, or has mentioned symptoms of Covid 19 within the past 7 to 10 days? No  If you have any concerns/questions about symptoms patients report during screening (either on the phone or at [place]). Contact the provider seeing the patient or DOD for further guidance.  If neither are available contact a member of the leadership team.

## 2018-06-27 NOTE — Consult Note (Deleted)
Note type error. 

## 2018-06-27 NOTE — Progress Notes (Signed)
Pt's BP was 105/62 and HR 56. Paged MD to see if they want to give the Metoprolol 25 mg for tonight. MD gave orders to give half of the Metoprolol. Will continue to monitor.

## 2018-06-27 NOTE — H&P (Signed)
Cardiology History & Physical    Patient ID: David Cantrell MRN: [id number], DOB: [birth year] Date of Encounter: 06/27/2018, 2:24 PM Primary Physician: [name], MD Primary Cardiologist: Peter Martinique, MD Primary Electrophysiologist:  None  Chief Complaint: exertional shortness of breath and chest tightness Reason for Admission: planned hydration for cath in AM Requesting MD: Fabian Sharp PA-C  HPI: David Cantrell is a 78 y.o. 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  He had a cardiac catheterization in 2012 that showed obstructive disease in the left PDA. This was managed medically. He also had a follow-up Myoview study in 2015 that was normal, EF 61%. He recently saw [name] in f/u and CT scan showed 4.3cm ascending aortic aneurysm. At that visit he reported symptoms concerning for angina and was advised to f/u cardiology. He saw Angie Duke PA-C 06/24/18 at which time he reported increased fatigue, SOB and chest tightness while working in the yard. He also reported radiation down his left arm. This has progressed since the beginning of the year. He also notices some shortness of breath and dizziness if he bends over and stands up. EKG showed no acute changes but symptoms were felt concerning for [place] therefore cardiac cath was recommended per Angie's discussion with Dr. Gwenlyn Found. Given his CKD stage III, he was admitted for pre-cath hydration. Pre-cath labs showed K 4.5, Cr 1.29, normal CBC, negative Covid. VSS. He denies any discomfort at rest. He otherwise has no complaints today.   Past Medical History:  Diagnosis Date   Allergy    Aortic ectasia ([place])    a. 2.5cm aortic ectasia by CT 05/2017 (follow-up due 05/2022).   Arthritis    Bronchiectasis ([place])    per pulmologist note -- dr  [place]-- related to multiple episodes pneumonia as adult   CAD (coronary artery disease)    a. L PDA obstructive disease 2012, managed medically.   Cancer ([place])    basal cell ca   Cataract    Centrilobular emphysema ([place])    Congenital renal cyst    COPD (chronic obstructive pulmonary disease) ([facility])    pulmologist-  dr [place]ells   Diverticulosis of colon    Emphysema of lung ([place])    First degree heart block    Hemorrhoids    History of adenomatous polyp of colon    hx multiple tubular adenoma's and hyperplastic polyp's since 2001--- last colonoscopy 05-29-2015   History of basal cell carcinoma excision    10-31-2012  nose   History of external beam radiation therapy 02-01-2016  to 03-16-2016   prostate 45Gy  plus radioactive prostate seed implants 04-07-2016   History of kidney stones    Hyperlipidemia    Hypertension    Multiple pulmonary nodules    S/P  BILATERAL BRONCHOSCOPY  09-24-2014  no infection --     Open-angle glaucoma    RIGHT EYE ONLY/OPEN ANGLE   Prostate cancer [facility]) urologist-  dr eskridge/  oncologist-  dr Tammi Klippel   Stage T1c,  Gleason3+4, PSA 4.7--  s/p  external beam radiation therapy 02-01-2016 to 03-16-2016   Seasonal allergies    Thoracic aortic aneurysm ([place])    a. [name] following -4.3cm by CT 06/2018.   Wears dentures    full upper and lower parital  Surgical History:  Past Surgical History:  Procedure Laterality Date   CARDIAC CATHETERIZATION  09-04-2002  DR Wynonia Lawman   NORMAL LVF/  MILD TO MODERATE CAD INVOLVING PROXIMAL 40%  AND MID 40%  LAD/  POSTERIOR DESCENDING CX 70%   CARDIAC CATHETERIZATION  06-05-2010  DR TILEY   NORMAL LM/  40% PROXIMAL MID LAD/ 70% DISTAL CFX/  80% PROXIMAL PDA/  SMALL & NONDOMINANT RCA   CARDIOVASCULAR STRESS TEST  06-01-2013   dr Wynonia Lawman   normal lexiscan myoview w/ no ischemia/ normal LV function and wall motion, ef 61%   COLONOSCOPY     COLONOSCOPY W/ POLYPECTOMY  last one 05-29-2015     CYSTOSCOPY N/A 04/07/2016   Procedure: CYSTOSCOPY FLEXIBLE;  Surgeon: Festus Aloe, MD;  Location: [facility];  Service: Urology;  Laterality: N/A;  no seeds found in bladder   CYSTOSCOPY WITH RETROGRADE PYELOGRAM, URETEROSCOPY AND STENT PLACEMENT Right 02/02/2013   Procedure: CYSTOSCOPY WITH RETROGRADE PYELOGRAM, AND Right STENT PLACEMENT;  Surgeon: Molli Hazard, MD;  Location: [facility];  Service: Urology;  Laterality: Right;   CYSTOSCOPY WITH RETROGRADE PYELOGRAM, URETEROSCOPY AND STENT PLACEMENT Right 02/15/2013   Procedure: CYSTOSCOPY WITH RETROGRADE PYELOGRAM, URETEROSCOPY AND STENT EXCHANGE;  Surgeon: Molli Hazard, MD;  Location: [facility];  Service: Urology;  Laterality: Right;   ESI     ; for cervical radiculopathy   HEMORRHOID SURGERY  07-03-1999   HOLMIUM LASER APPLICATION Right 03/16/1935   Procedure: HOLMIUM LASER APPLICATION;  Surgeon: Molli Hazard, MD;  Location: [facility];  Service: Urology;  Laterality: Right;   PROSTATE BIOPSY     RADIOACTIVE SEED IMPLANT N/A 04/07/2016   Procedure: RADIOACTIVE SEED IMPLANT/BRACHYTHERAPY IMPLANT;  Surgeon: Festus Aloe, MD;  Location: [facility];  Service: Urology;  Laterality: N/A;  55 seeds implanted   SHOULDER OPEN ROTATOR CUFF REPAIR Bilateral RIGHT X2  LAST ONE 1997/  LEFT 2005   right inferior glenoid screw retained   VIDEO BRONCHOSCOPY Bilateral 09/24/2014   Procedure: VIDEO BRONCHOSCOPY WITH FLUORO;  Surgeon: Juanito Doom, MD;  Location: [facility];  Service: Cardiopulmonary;  Laterality: Bilateral;     Home Meds: Prior to Admission medications   Medication Sig Start Date End Date Taking? Authorizing Provider  acetaminophen (TYLENOL) 500 MG tablet Take 1,000 mg by mouth every 6 (six) hours as needed for mild pain.     [provider]  albuterol (PROVENTIL HFA;VENTOLIN HFA) 108 (90 BASE) MCG/ACT inhaler Inhale 2 puffs  into the lungs every 6 (six) hours as needed for wheezing or shortness of breath. 08/13/14   Hendricks Limes, MD  alfuzosin (UROXATRAL) 10 MG 24 hr tablet Take 1 tablet (10 mg total) by mouth daily before supper. Patient not taking: Reported on 06/24/2018 04/07/16   Festus Aloe, MD  aspirin EC 81 MG tablet Take 81 mg by mouth every evening.     [provider]  brimonidine (ALPHAGAN) 0.15 % ophthalmic solution Place 1 drop into the right eye 2 (two) times daily.  02/08/16   [provider]  cyclobenzaprine (FLEXERIL) 5 MG tablet Take 5 mg by mouth daily as needed for muscle spasms.    [provider]  dorzolamide-timolol (COSOPT) 22.3-6.8 MG/ML ophthalmic solution Place 1 drop into the right eye 2 (two) times daily.    [provider]  fenofibrate 160 MG tablet Take 1 tablet (160 mg total) by mouth daily. 02/21/18   Martinique, Peter M, MD  fexofenadine (  ALLEGRA) 180 MG tablet Take 180 mg by mouth daily as needed for allergies or rhinitis.    [provider]  Flaxseed, Linseed, 1300 MG CAPS Take by mouth 2 (two) times daily with a meal.    [provider]  GARLIC PO Take 1 capsule by mouth daily.    [provider]  hydrocortisone ointment 0.5 % Apply 1 application topically 2 (two) times daily as needed for itching.     [provider]  hyoscyamine (LEVSIN SL) 0.125 MG SL tablet Place 1 tablet (0.125 mg total) under the tongue as needed. MUST HAVE OFFICE VISIT WITH DR Hilarie Fredrickson FOR FURTHER REFILLS!!! 09/07/17   Willia Craze, NP  metoprolol tartrate (LOPRESSOR) 25 MG tablet Take 1 tablet by mouth twice daily. Needs office visit for more refills. 01/13/18   [name], MD  Multiple Vitamins-Minerals (MULTIVITAMIN ADULTS PO) Take 1 tablet by mouth daily.     [provider]  NITROSTAT 0.4 MG SL tablet Place 0.4 mg under the tongue every 5 (five) minutes as needed for chest pain.  05/29/11   [provider]    Omega-3 Fatty Acids (FISH OIL) 1200 MG CAPS Take 1 capsule by mouth 2 (two) times daily. 1 by mouth daily    [provider]  ondansetron (ZOFRAN) 4 MG tablet Take 1 tablet (4 mg total) by mouth every 8 (eight) hours as needed. Patient not taking: Reported on 06/24/2018 05/21/17   Tegeler, Gwenyth Allegra, MD  polyethylene glycol-electrolytes (NULYTELY/GOLYTELY) 420 g solution Golytely as directed Patient not taking: Reported on 06/24/2018 06/16/18   Pyrtle, Lajuan Lines, MD  Probiotic Product (ALIGN PO) Take by mouth daily.    [provider]  psyllium (METAMUCIL) 58.6 % packet Take 1 packet by mouth every morning.     [provider]  rosuvastatin (CRESTOR) 10 MG tablet Take 1 tablet (10 mg total) by mouth 2 (two) times a week. Monday and thursday 01/24/18   Martinique, Peter M, MD  shark liver oil-cocoa butter (PREPARATION H) 0.25-88.44 % suppository Place 1 suppository rectally as needed for hemorrhoids.    [provider]  tamsulosin (FLOMAX) 0.4 MG CAPS capsule Take 1 capsule (0.4 mg total) by mouth daily. Patient taking differently: Take 0.8 mg by mouth daily.  04/30/16   Bruning, Ashlyn, PA-C    Allergies:  Allergies  Allergen Reactions   Doxycycline     Dizziness    Lipitor [Atorvastatin] Other (See Comments)    MUSCLE ACHES   Morphine And Related Other (See Comments)    Severe headache   Niacin And Related Other (See Comments)    FLUSHING   Ramipril Other (See Comments)    COUGH   Sulfa Antibiotics Hives   Codeine Other (See Comments)    CONSTIPATION    Social History   Socioeconomic History   Marital status: Married    Spouse name: Not on file   Number of children: 3   Years of education: Not on file   Highest education level: Not on file  Occupational History   Not on file  Social Needs   Financial resource strain: Not on file   Food insecurity    Worry: Not on file    Inability: Not on file   Transportation needs     Medical: Not on file    Non-medical: Not on file  Tobacco Use   Smoking status: Former Smoker    Packs/day: 2.00    Years: 3.00  Pack years: 6.00    Types: Cigarettes    Quit date: 01/12/1970    Years since quitting: 48.4   Smokeless tobacco: Never Used  Substance and Sexual Activity   Alcohol use: No    Alcohol/week: 0.0 standard drinks   Drug use: No   Sexual activity: Not on file  Lifestyle   Physical activity    Days per week: Not on file    Minutes per session: Not on file   Stress: Not on file  Relationships   Social connections    Talks on phone: Not on file    Gets together: Not on file    Attends religious service: Not on file    Active member of club or organization: Not on file    Attends meetings of clubs or organizations: Not on file    Relationship status: Not on file   Intimate partner violence    Fear of current or ex partner: Not on file    Emotionally abused: Not on file    Physically abused: Not on file    Forced sexual activity: Not on file  Other Topics Concern   Not on file  Social History Narrative   Not on file     Family History  Problem Relation Age of Onset   Stroke Father        in 28s   Colon cancer Father        upper 85's   Cancer Father        colon   Heart attack Brother 70       stent   Cancer Brother        pre cancerous bladder lesion   Diabetes Paternal Uncle    Cancer Cousin        prostate   Cancer Other        bladder   Arthritis Neg Hx    Asthma Neg Hx    COPD Neg Hx    Rectal cancer Neg Hx    Stomach cancer Neg Hx    Esophageal cancer Neg Hx     Review of Systems: All other systems reviewed and are otherwise negative except as noted above.  Labs:   Lab Results  Component Value Date   WBC 6.8 06/24/2018   HGB 13.4 06/24/2018   HCT 39.6 06/24/2018   MCV 95 06/24/2018   PLT 258 06/24/2018    Recent Labs  Lab 06/24/18 1106  NA 139  K 4.5  CL 105  CO2 24  BUN 15   CREATININE 1.29*  CALCIUM 9.6  GLUCOSE 104*   No results for input(s): CKTOTAL, CKMB, TROPONINI in the last 72 hours. Lab Results  Component Value Date   CHOL 135 01/17/2018   HDL 35 (L) 01/17/2018   LDLCALC 74 01/17/2018   TRIG 131 01/17/2018   No results found for: DDIMER  Radiology/Studies:  Ct Chest Wo Contrast  Result Date: 06/22/2018 CLINICAL DATA:  Thoracic aortic aneurysm. Shortness of breath with exertion, emphysema. No prior surgery. EXAM: CT CHEST WITHOUT CONTRAST TECHNIQUE: Multidetector CT imaging of the chest was performed following the standard protocol without IV contrast. COMPARISON:  05/21/2017 FINDINGS: Cardiovascular: Heart size normal. No pericardial effusion. Central pulmonary arteries upper limits normal in diameter. Scattered coronary calcifications. Transverse dimensions of thoracic aorta as follows: 4 cm sinuses of Valsalva 3.6 cm sino-tubular junction 4.3 cm mid ascending (stable by my  measurements since prior study) 4 cm distal ascending/proximal arch 3.4 cm distal arch/proximal descending  2.8 cm distal descending Moderate scattered calcified plaque in the arch and descending thoracic segment. Visualized proximal abdominal aorta shows similar mild atheromatous change without dilatation. Mediastinum/Nodes: No hilar or mediastinal adenopathy. Lungs/Pleura: No pleural effusion. No pneumothorax. Pulmonary emphysema most evident in the upper lobes. 7 cm subpleural nodule posterior left lower lobe image 112/8 7 mm subpleural nodule posterior right lower lobe image 115/8 7 mm pleural-based nodule posteromedial right lower lobe 4 mm nodule superior segment right lower lobe 3 mm nodule lateral left upper lobe All the above nodules were present on films dating back to 07/03/2013 and not significantly increased in size. No definite new nodule. No airspace disease. Calcified subpleural granuloma in the lateral basal segment right lower lobe. Upper Abdomen: Right renal cysts,  present since 01/02/2013. No acute findings. Musculoskeletal: Spondylitic changes in the visualized lower cervical spine. Orthopedic hardware in the right scapula. No fracture or worrisome bone lesion. IMPRESSION: 1. Stable 4.3 cm ascending thoracic aortic aneurysm without complicating features. Recommend annual imaging followup by CTA or MRA. This recommendation follows 2010 ACCF/AHA/AATS/ACR/ASA/SCA/SCAI/SIR/STS/SVM Guidelines for the Diagnosis and Management of Patients with Thoracic Aortic Disease. 2010; 121: Z025-E527. 2. Coronary and Aortic Atherosclerosis (ICD10-I70.0). 3. Bilateral subcentimeter pulmonary nodules stable x5 years, presumably benign. Electronically Signed   By: Lucrezia Europe M.D.   On: 06/22/2018 13:05   Wt Readings from Last 3 Encounters:  06/24/18 100.8 kg  06/22/18 100.6 kg  06/16/18 104.3 kg    EKG: 06/24/18 NSR with first degree AVB and no acute STT changes  Physical Exam: Blood pressure 121/80, pulse 65, temperature 98.5 F (36.9 C), temperature source Oral, resp. rate 15, SpO2 99 %. There is no height or weight on file to calculate BMI. General: Well developed, well nourished WM in no acute distress. Lying 30 degrees flat in bed without dyspnea. Head: Normocephalic, atraumatic, sclera non-icteric, no xanthomas, nares are without discharge.  Neck: JVD not elevated. Lungs: Clear bilaterally to auscultation without wheezes, rales, or rhonchi. Breathing is unlabored. Heart: RRR with S1 S2. No murmurs, rubs, or gallops appreciated. Abdomen: Soft, non-tender, non-distended with normoactive bowel sounds. No hepatomegaly. No rebound/guarding. No obvious abdominal masses. Msk:  Strength and tone appear normal for age. Extremities: No clubbing or cyanosis. No edema.  Distal pedal pulses are 2+ and equal bilaterally. Neuro: Alert and oriented X 3. No focal deficit. No facial asymmetry. Moves all extremities spontaneously. Psych:  Responds to questions appropriately with a normal  affect.    Assessment and Plan   1. Chest discomfort/SOB concerning for unstable angina - plan cath in AM. He reports some dizziness upon standing so likely cannot titrate antianginals any further right now. Spoke with Doreene Adas, plan is to start hydration now. Will addend cath orders. Risks and benefits of cardiac catheterization have been discussed with the patient.  These include bleeding, infection, kidney damage, stroke, heart attack, death.  The patient understands these risks and is willing to proceed. This is scheduled for tomorrow AM with Dr. Martinique. He took his ASA, metoprolol, fenofibrate, Crestor, and flomax already today.  2. Essential HTN - controlled.  3. Hyperlipidemia - check lipids/LFTs in AM.  4. CKD stage III - not currently on any nephrotoxic meds. Hydrate pre-cath. F/u BMET in AM.   Severity of Illness: The appropriate patient status for this patient is OBSERVATION. Observation status is judged to be reasonable and necessary in order to provide the required intensity of service to ensure the patient's safety. The patient's presenting symptoms, physical exam  findings, and initial radiographic and laboratory data in the context of their medical condition is felt to place them at decreased risk for further clinical deterioration. Furthermore, it is anticipated that the patient will be medically stable for discharge from the hospital within 2 midnights of admission. The following factors support the patient status of observation.   " The patient's presenting symptoms include chest tightness, SOB. " The physical exam findings include normal rhythm, no distress " The initial radiographic and laboratory data are unrevealing, which is why cath is necessary   For questions or updates, please contact [facility] Please consult [email or web address] for contact info under Cardiology/STEMI.  Signed, Charlie Pitter, PA-C 06/27/2018, 2:24 PM

## 2018-06-28 ENCOUNTER — Ambulatory Visit (HOSPITAL_COMMUNITY): Admission: RE | Admit: 2018-06-28 | Payer: PPO | Source: Home / Self Care | Admitting: Cardiology

## 2018-06-28 ENCOUNTER — Encounter (HOSPITAL_COMMUNITY)
Admission: AD | Disposition: A | Discharge status: Home / Self Care | Payer: Self-pay | Source: Ambulatory Visit | Attending: Cardiology

## 2018-06-28 ENCOUNTER — Telehealth: Payer: Self-pay | Admitting: Physician Assistant

## 2018-06-28 ENCOUNTER — Other Ambulatory Visit: Payer: Self-pay | Admitting: Physician Assistant

## 2018-06-28 DIAGNOSIS — R0789 Other chest pain: Secondary | ICD-10-CM

## 2018-06-28 DIAGNOSIS — I2583 Coronary atherosclerosis due to lipid rich plaque: Secondary | ICD-10-CM | POA: Diagnosis not present

## 2018-06-28 DIAGNOSIS — I25110 Atherosclerotic heart disease of native coronary artery with unstable angina pectoris: Secondary | ICD-10-CM | POA: Diagnosis not present

## 2018-06-28 DIAGNOSIS — I2510 Atherosclerotic heart disease of native coronary artery without angina pectoris: Secondary | ICD-10-CM | POA: Diagnosis not present

## 2018-06-28 DIAGNOSIS — N183 Chronic kidney disease, stage 3 unspecified: Secondary | ICD-10-CM

## 2018-06-28 DIAGNOSIS — I10 Essential (primary) hypertension: Secondary | ICD-10-CM | POA: Diagnosis not present

## 2018-06-28 DIAGNOSIS — R001 Bradycardia, unspecified: Secondary | ICD-10-CM | POA: Diagnosis not present

## 2018-06-28 DIAGNOSIS — I200 Unstable angina: Secondary | ICD-10-CM | POA: Diagnosis not present

## 2018-06-28 DIAGNOSIS — E7800 Pure hypercholesterolemia, unspecified: Secondary | ICD-10-CM | POA: Diagnosis not present

## 2018-06-28 HISTORY — PX: LEFT HEART CATH AND CORONARY ANGIOGRAPHY: CATH118249

## 2018-06-28 LAB — CBC
HCT: 34.2 % — ABNORMAL LOW (ref 39.0–52.0)
Hemoglobin: 11.7 g/dL — ABNORMAL LOW (ref 13.0–17.0)
MCH: 32 pg (ref 26.0–34.0)
MCHC: 34.2 g/dL (ref 30.0–36.0)
MCV: 93.4 fL (ref 80.0–100.0)
Platelets: 217 10*3/uL (ref 150–400)
RBC: 3.66 MIL/uL — ABNORMAL LOW (ref 4.22–5.81)
RDW: 12.8 % (ref 11.5–15.5)
WBC: 6 10*3/uL (ref 4.0–10.5)
nRBC: 0 % (ref 0.0–0.2)

## 2018-06-28 LAB — BASIC METABOLIC PANEL
Anion gap: 7 (ref 5–15)
BUN: 15 mg/dL (ref 8–23)
CO2: 21 mmol/L — ABNORMAL LOW (ref 22–32)
Calcium: 8.4 mg/dL — ABNORMAL LOW (ref 8.9–10.3)
Chloride: 113 mmol/L — ABNORMAL HIGH (ref 98–111)
Creatinine, Ser: 1.31 mg/dL — ABNORMAL HIGH (ref 0.61–1.24)
GFR calc Af Amer: >60 mL/min (ref >60)
GFR calc non Af Amer: 53 mL/min — ABNORMAL LOW (ref >60)
Glucose, Bld: 97 mg/dL (ref 70–99)
Potassium: 3.8 mmol/L (ref 3.5–5.1)
Sodium: 141 mmol/L (ref 135–145)

## 2018-06-28 LAB — HEPATIC FUNCTION PANEL
ALT: 15 U/L (ref 0–44)
AST: 16 U/L (ref 15–41)
Albumin: 2.9 g/dL — ABNORMAL LOW (ref 3.5–5.0)
Alkaline Phosphatase: 46 U/L (ref 38–126)
Bilirubin, Direct: <0.1 mg/dL (ref 0.0–0.2)
Total Bilirubin: 0.7 mg/dL (ref 0.3–1.2)
Total Protein: 6.3 g/dL — ABNORMAL LOW (ref 6.5–8.1)

## 2018-06-28 LAB — LIPID PANEL
Cholesterol: 96 mg/dL (ref 0–200)
HDL: 23 mg/dL — ABNORMAL LOW (ref >40)
LDL Cholesterol: 56 mg/dL (ref 0–99)
Total CHOL/HDL Ratio: 4.2 RATIO
Triglycerides: 87 mg/dL (ref <150)
VLDL: 17 mg/dL (ref 0–40)

## 2018-06-28 SURGERY — LEFT HEART CATH AND CORONARY ANGIOGRAPHY
Anesthesia: LOCAL

## 2018-06-28 MED ORDER — HEPARIN (PORCINE) IN NACL 1000-0.9 UT/500ML-% IV SOLN
INTRAVENOUS | Status: DC | PRN
  Administered 2018-06-28 (×2): 500 mL

## 2018-06-28 MED ORDER — IOHEXOL 350 MG/ML SOLN
INTRAVENOUS | Status: DC | PRN
  Administered 2018-06-28: 55 mL via INTRA_ARTERIAL

## 2018-06-28 MED ORDER — HYDROCORTISONE 1 % EX OINT
1.0000 "application " | TOPICAL_OINTMENT | Freq: Two times a day (BID) | CUTANEOUS | Status: DC | PRN
  Filled 2018-06-28: qty 28.35

## 2018-06-28 MED ORDER — MIDAZOLAM HCL 2 MG/2ML IJ SOLN
INTRAMUSCULAR | Status: DC | PRN
  Administered 2018-06-28: 1 mg via INTRAVENOUS

## 2018-06-28 MED ORDER — HEPARIN SODIUM (PORCINE) 1000 UNIT/ML IJ SOLN
INTRAMUSCULAR | Status: DC | PRN
  Administered 2018-06-28: 5000 [IU] via INTRAVENOUS

## 2018-06-28 MED ORDER — VERAPAMIL HCL 2.5 MG/ML IV SOLN
INTRAVENOUS | Status: DC | PRN
  Administered 2018-06-28: 10 mL via INTRA_ARTERIAL

## 2018-06-28 MED ORDER — FENTANYL CITRATE (PF) 100 MCG/2ML IJ SOLN
INTRAMUSCULAR | Status: DC | PRN
  Administered 2018-06-28: 25 ug via INTRAVENOUS

## 2018-06-28 MED ORDER — LIDOCAINE HCL (PF) 1 % IJ SOLN
INTRAMUSCULAR | Status: DC | PRN
  Administered 2018-06-28: 2 mL

## 2018-06-28 MED ORDER — TAMSULOSIN HCL 0.4 MG PO CAPS: 0.8000 mg | ORAL_CAPSULE | Freq: Every day | ORAL | Status: DC

## 2018-06-28 SURGICAL SUPPLY — 10 items
CATH 5FR JL3.5 JR4 ANG PIG MP (CATHETERS) ×1 IMPLANT
DEVICE RAD COMP TR BAND LRG (VASCULAR PRODUCTS) ×1 IMPLANT
GLIDESHEATH SLEND SS 6F .021 (SHEATH) ×1 IMPLANT
GUIDEWIRE INQWIRE 1.5J.035X260 (WIRE) IMPLANT
INQWIRE 1.5J .035X260CM (WIRE) ×2
KIT HEART LEFT (KITS) ×2 IMPLANT
PACK CARDIAC CATHETERIZATION (CUSTOM PROCEDURE TRAY) ×2 IMPLANT
TRANSDUCER W/STOPCOCK (MISCELLANEOUS) ×2 IMPLANT
TUBING CIL FLEX 10 FLL-RA (TUBING) ×2 IMPLANT
WIRE HI TORQ VERSACORE-J 145CM (WIRE) ×1 IMPLANT

## 2018-06-28 NOTE — Interval H&P Note (Signed)
History and Physical Interval Note:  06/28/2018 10:39 AM  David Cantrell  has presented today for surgery, with the diagnosis of Unstable angina.  The various methods of treatment have been discussed with the patient and family. After consideration of risks, benefits and other options for treatment, the patient has consented to  Procedure(s): LEFT HEART CATH AND CORONARY ANGIOGRAPHY (N/A) as a surgical intervention.  The patient's history has been reviewed, patient examined, no change in status, stable for surgery.  I have reviewed the patient's chart and labs.  Questions were answered to the patient's satisfaction.   Cath Lab Visit (complete for each Cath Lab visit)  Clinical Evaluation Leading to the Procedure:   ACS: Yes.    Non-ACS:    Anginal Classification: CCS III  Anti-ischemic medical therapy: Minimal Therapy (1 class of medications)  Non-Invasive Test Results: No non-invasive testing performed  Prior CABG: No previous CABG        David Cantrell 06/28/2018 10:40 AM

## 2018-06-28 NOTE — Progress Notes (Signed)
Pt still has about another hour until dc based on TR band per d/w nurse. I asked nurse to page on call APP Rosaria Ferries with update how pt does so that she can place DC order. I also signed out to [place] as well. Dayna Dunn PA-C

## 2018-06-28 NOTE — Telephone Encounter (Signed)
   TELEPHONE CALL NOTE  This patient has been deemed a candidate for follow-up tele-health visit to limit community exposure during the Covid-19 pandemic. I spoke with the patient via phone to discuss instructions. Discussed consent as well and he agrees yes to consent for visit. His wife will help him with video visit on her mobile (she has iphone). I listed her # in appt notes.  Charlie Pitter, PA-C 06/28/2018 2:56 PM

## 2018-06-28 NOTE — Progress Notes (Signed)
TR band has been removed and dressing applied for 40 minutes. Dressing is clean and dry. Patient denies any pain.  Discharge packet given with medication education and teach back.  Patient's belongings with patient: cell phone, clothes and shoes. Peripheral iv removed, clean dry and intact, pressure and dressing applied. Radial site care instructions printed from reference tab and given to patient. Patient verbalizes understanding.  Pt's spouse to pick up patient and will call when at [facility] entrance. Patient is ambulating in his room and denies pain or shortness of breath.

## 2018-06-28 NOTE — Discharge Instructions (Signed)
YOUR CARDIOLOGY TEAM HAS ARRANGED FOR AN E-VISIT FOR YOUR APPOINTMENT - PLEASE REVIEW IMPORTANT INFORMATION BELOW SEVERAL DAYS PRIOR TO YOUR APPOINTMENT  Due to the recent COVID-19 pandemic, we are transitioning in-person office visits to tele-medicine visits in an effort to decrease unnecessary exposure to our patients, their families, and staff. These visits are billed to your insurance just like a normal visit is. We also encourage you to sign up for MyChart if you have not already done so. You will need a smartphone if possible. For patients that do not have this, we can still complete the visit using a regular telephone but do prefer a smartphone to enable video when possible. You may have a family member that lives with you that can help. If possible, we also ask that you have a blood pressure cuff and scale at home to measure your blood pressure, heart rate and weight prior to your scheduled appointment. Patients with clinical needs that need an in-person evaluation and testing will still be able to come to the office if absolutely necessary. If you have any questions, feel free to call our office.    THE DAY OF YOUR APPOINTMENT  Approximately 15 minutes prior to your scheduled appointment, you will receive a telephone call from one of [facility] team - your caller ID may say "Unknown caller."  Our staff will confirm medications, vital signs for the day and any symptoms you may be experiencing. Please have this information available prior to the time of visit start. It may also be helpful for you to have a pad of paper and pen handy for any instructions given during your visit. They will also walk you through joining the smartphone meeting if this is a video visit.    CONSENT FOR TELE-HEALTH VISIT - PLEASE REVIEW  I hereby voluntarily request, consent and authorize [facility] and its employed or contracted physicians, physician assistants, nurse practitioners or other licensed health  care professionals (the Practitioner), to provide me with telemedicine health care services (the Services") as deemed necessary by the treating Practitioner. I acknowledge and consent to receive the Services by the Practitioner via telemedicine. I understand that the telemedicine visit will involve communicating with the Practitioner through live audiovisual communication technology and the disclosure of certain medical information by electronic transmission. I acknowledge that I have been given the opportunity to request an in-person assessment or other available alternative prior to the telemedicine visit and am voluntarily participating in the telemedicine visit.  I understand that I have the right to withhold or withdraw my consent to the use of telemedicine in the course of my care at any time, without affecting my right to future care or treatment, and that the Practitioner or I may terminate the telemedicine visit at any time. I understand that I have the right to inspect all information obtained and/or recorded in the course of the telemedicine visit and may receive copies of available information for a reasonable fee.  I understand that some of the potential risks of receiving the Services via telemedicine include:   Delay or interruption in medical evaluation due to technological equipment failure or disruption;  Information transmitted may not be sufficient (e.g. poor resolution of images) to allow for appropriate medical decision making by the Practitioner; and/or   In rare instances, security protocols could fail, causing a breach of personal health information.  Furthermore, I acknowledge that it is my responsibility to provide information about my medical history, conditions and care that  is complete and accurate to the best of my ability. I acknowledge that Practitioner's advice, recommendations, and/or decision may be based on factors not within their control, such as incomplete or  inaccurate data provided by me or distortions of diagnostic images or specimens that may result from electronic transmissions. I understand that the practice of medicine is not an exact science and that Practitioner makes no warranties or guarantees regarding treatment outcomes. I acknowledge that I will receive a copy of this consent concurrently upon execution via email to the email address I last provided but may also request a printed copy by calling the office of [facility].    I understand that my insurance will be billed for this visit.   I have read or had this consent read to me.  I understand the contents of this consent, which adequately explains the benefits and risks of the Services being provided via telemedicine.   I have been provided ample opportunity to ask questions regarding this consent and the Services and have had my questions answered to my satisfaction.  I give my informed consent for the services to be provided through the use of telemedicine in my medical care  By participating in this telemedicine visit I agree to the above.

## 2018-06-28 NOTE — Care Management Obs Status (Signed)
Page NOTIFICATION   Patient Details  Name: David Cantrell MRN: [id number] Date of Birth: [birth year]   Medicare Observation Status Notification Given:  Yes    Bethena Roys, RN 06/28/2018, 3:56 PM

## 2018-06-28 NOTE — Progress Notes (Addendum)
Progress Note  Patient Name: David Cantrell Date of Encounter: 06/28/2018  Primary Cardiologist: Peter Martinique, MD   Subjective   Doing well this am.  Denies any chest pain but still has DOE when getting up to go to the bathroom  Inpatient Medications    Scheduled Meds: . acidophilus  1 capsule Oral Daily  . [START ON 06/29/2018] aspirin EC  81 mg Oral Daily  . brimonidine  1 drop Right Eye BID  . dorzolamide-timolol  1 drop Right Eye BID  . fenofibrate  160 mg Oral Daily  . heparin  5,000 Units Subcutaneous Q8H  . metoprolol tartrate  25 mg Oral BID  . multivitamin with minerals  1 tablet Oral Daily  . psyllium  1 packet Oral BH-[id number]  . [START ON 07/01/2018] rosuvastatin  10 mg Oral 2 times weekly  . sodium chloride flush  3 mL Intravenous Q12H  . sodium chloride flush  3 mL Intravenous Q12H  . tamsulosin  0.8 mg Oral Daily   Continuous Infusions: . sodium chloride    . sodium chloride    . sodium chloride 1 mL/kg/hr (06/27/18 2144)   PRN Meds: sodium chloride, sodium chloride, acetaminophen, albuterol, cyclobenzaprine, hyoscyamine, loratadine, nitroGLYCERIN, ondansetron (ZOFRAN) IV, sodium chloride flush, sodium chloride flush   Vital Signs    Vitals:   06/27/18 1315 06/27/18 2033 06/28/18 0105 06/28/18 0514  BP:  104/62 (!) 96/51 131/80  Pulse:  63 (!) 57 (!) 58  Resp:  18 16 16   Temp:  98.4 F (36.9 C) 98.4 F (36.9 C) 98.1 F (36.7 C)  TempSrc:  Oral Oral Oral  SpO2:  98% 97% 98%  Weight: 99.1 kg   99.8 kg  Height: 6\' 1"  (1.854 m)       Intake/Output Summary (Last 24 hours) at 06/28/2018 0819 Last data filed at 06/28/2018 0545 Gross per 24 hour  Intake 1506.53 ml  Output -  Net 1506.53 ml   Filed Weights   06/27/18 1315 06/28/18 0514  Weight: 99.1 kg 99.8 kg    Telemetry    NSR - Personally Reviewed  ECG    No new EKG to review - Personally Reviewed  Physical Exam   GEN: No acute distress.   Neck: No JVD Cardiac: RRR, no murmurs, rubs, or  gallops.  Respiratory: Clear to auscultation bilaterally. GI: Soft, nontender, non-distended  MS: No edema; No deformity. Neuro:  Nonfocal  Psych: Normal affect   Labs    Chemistry Recent Labs  Lab 06/24/18 1106 06/28/18 0603  NA 139 141  K 4.5 3.8  CL 105 113*  CO2 24 21*  GLUCOSE 104* 97  BUN 15 15  CREATININE 1.29* 1.31*  CALCIUM 9.6 8.4*  PROT  --  6.3*  ALBUMIN  --  2.9*  AST  --  16  ALT  --  15  ALKPHOS  --  46  BILITOT  --  0.7  GFRNONAA 53* 53*  GFRAA 62 >60  ANIONGAP  --  7     Hematology Recent Labs  Lab 06/24/18 1106 06/28/18 0603  WBC 6.8 6.0  RBC 4.18 3.66*  HGB 13.4 11.7*  HCT 39.6 34.2*  MCV 95 93.4  MCH 32.1 32.0  MCHC 33.8 34.2  RDW 12.1 12.8  PLT 258 217    Cardiac EnzymesNo results for input(s): TROPONINI in the last 168 hours. No results for input(s): TROPIPOC in the last 168 hours.   BNPNo results for input(s): BNP, PROBNP in the  last 168 hours.   DDimer No results for input(s): DDIMER in the last 168 hours.   Radiology    No results found.  Cardiac Studies   none  Patient Profile     78 y.o. male with a hx of CAD with cath in 2012 with obstructive disease of the PDA who has been on medical management.  He also has HTN, HLD, CKD, Ascending aortic aneurysm 4.3cm recently by CT.  He was recently seen by [name] and mentioned that he was having exertional angina and SOB and was seen by the extender on 06/24/2018.  He admitted to CP that radiates to his left arm with SOB, dizziness with exertion for several months that has progressively gotten worse.  He is now admitted for cardiac cath due to [place].   Assessment & Plan    1.  Unstable angina -he has been having progressive anginal sx for the past few months that now occur with any exertion -admitted for IVF hydration prior to cath -denies any CP today -plan for LHC today -continue ASA and statin  2.  Bradycardia -HR slow last night and only received 1/2 of his BB dose  -HR this am 49bpm -Hold BB this am  3.  Hypertension -BP well controlled -holding BB due to bradycardia  4.  Hyperlipidemia -LDL at goal at 56 -continue Crestor 10mg  2x weekly and Fenofibrate 160mg  daily.   5.  CKD stage 3 -received IVF overnight -creatinine remains around baseline today at 1.31  For questions or updates, please contact [facility] Please consult [email or web address] for contact info under Cardiology/STEMI.      Signed, Fransico Him, MD  06/28/2018, 8:19 AM

## 2018-06-28 NOTE — Discharge Summary (Addendum)
Discharge Summary    Patient ID: David Cantrell,  MRN: [id number], DOB/AGE: [birth year] 78 y.o.  Admit date: 06/27/2018 Discharge date: 06/28/2018  Primary Care Provider: [name] Primary Cardiologist: Peter Martinique, MD Primary Electrophysiologist:  None  Discharge Diagnoses    Principal Problem:   Chest tightness Active Problems:   Hyperlipidemia   Essential hypertension   Coronary atherosclerosis   CKD (chronic kidney disease), stage III (HCC)   CKD (chronic kidney disease) stage 3, GFR 30-59 ml/min (HCC)   Sinus bradycardia    Diagnostic Studies/Procedures    1. Cath 06/28/18 Conclusion    Prox LAD to Mid LAD lesion is 40% stenosed.  Mid LAD lesion is 50% stenosed.  LPDA lesion is 65% stenosed.  The left ventricular systolic function is normal.  LV end diastolic pressure is normal.  The left ventricular ejection fraction is 55-65% by visual estimate.   1. Nonobstructive CAD 2. Normal LV function 3. Normal LVEDP  Plan: compared to prior cardiac cath in 2012 there is no significant change. Continue medical therapy. Consider other causes of dyspnea. Anticipate DC later today after 4 hours hydration    _____________     History of Present Illness     David Cantrell is a 78 y.o. 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  He had a cardiac catheterization in 2012 that showed obstructive disease in the left PDA, managed medically. He recently saw [name] in f/u and CT scan showed 4.3cm ascending aortic aneurysm. At that visit he reported symptoms concerning for angina and was advised to f/u cardiology. He saw Angie Duke PA-C 06/24/18 at which time he reported increased fatigue, SOB and chest tightness while working in the yard. He also reported  radiation down his left arm. This has progressed since the beginning of the year. He also noticed some shortness of breath and dizziness when bending over and standing up. EKG showed no acute changes but symptoms were felt concerning for [place] therefore cardiac cath was recommended per Angie's discussion with Dr. Gwenlyn Found. Given his CKD stage III, he was admitted for pre-cath hydration. Pre-cath labs showed K 4.5, Cr 1.29, normal CBC, negative Covid.   Hospital Course    He underwent cath as above without significant findings or changes from prior. He was noted to have sinus bradycardia with HR in the 40s-50s so metoprolol was held. It's not clear whether or not perhaps this was contributing to symptoms. Dr. Radford Pax recommends to stop metoprolol for now and re-evaluate for potential resumption at lower dose in follow-up. She also recommended to hold Cosopt eye drops (timolol) and patient was advised to call his ophthalmologist to discuss alternatives. The patient was observed post cath and did well. Dr. Radford Pax has seen and examined the patient today and feels he is stable for discharge. Otherwiseodansetron, Golytely, and alfuzosin were removed from his medicine list since he indicated he was not taking them and tamsulosin was updated to reflect that he is taking 0.8mg  daily at baseline. If still having dizziness upon standing in follow-up could consider having him discuss going back to lower dose with urology. We have arranged for him to have BMET on Friday to recheck post cath kidney function per MD recommendations. I spoke with [facility] office and he does not need placeholder  appt per their instructions. Virtual f/u has been arranged with Doreene Adas on 7/9, wife will assist with call. I obtained consent as well. He was also advised to f/u PCP to discuss regimen for COPD in case this is contributing (he previously saw a pulmonologist and does not wish to return to them).  Discharge Vitals Blood pressure (!) 99/53,  pulse (!) 59, temperature 98.1 F (36.7 C), temperature source Oral, resp. rate 10, height 6\' 1"  (1.854 m), weight 99.8 kg, SpO2 98 %.  Filed Weights   06/27/18 1315 06/28/18 0514  Weight: 99.1 kg 99.8 kg    Labs & Radiologic Studies    CBC Recent Labs    06/28/18 0603  WBC 6.0  HGB 11.7*  HCT 34.2*  MCV 93.4  PLT 735   Basic Metabolic Panel Recent Labs    06/28/18 0603  NA 141  K 3.8  CL 113*  CO2 21*  GLUCOSE 97  BUN 15  CREATININE 1.31*  CALCIUM 8.4*   Liver Function Tests Recent Labs    06/28/18 0603  AST 16  ALT 15  ALKPHOS 46  BILITOT 0.7  PROT 6.3*  ALBUMIN 2.9*   No results for input(s): LIPASE, AMYLASE in the last 72 hours. Cardiac Enzymes No results for input(s): CKTOTAL, CKMB, CKMBINDEX, TROPONINI in the last 72 hours. BNP Invalid input(s): POCBNP D-Dimer No results for input(s): DDIMER in the last 72 hours. Hemoglobin A1C No results for input(s): HGBA1C in the last 72 hours. Fasting Lipid Panel Recent Labs    06/28/18 0603  CHOL 96  HDL 23*  LDLCALC 56  TRIG 87  CHOLHDL 4.2   Thyroid Function Tests No results for input(s): TSH, T4TOTAL, T3FREE, THYROIDAB in the last 72 hours.  Invalid input(s): FREET3 _____________  Ct Chest Wo Contrast  Result Date: 06/22/2018 CLINICAL DATA:  Thoracic aortic aneurysm. Shortness of breath with exertion, emphysema. No prior surgery. EXAM: CT CHEST WITHOUT CONTRAST TECHNIQUE: Multidetector CT imaging of the chest was performed following the standard protocol without IV contrast. COMPARISON:  05/21/2017 FINDINGS: Cardiovascular: Heart size normal. No pericardial effusion. Central pulmonary arteries upper limits normal in diameter. Scattered coronary calcifications. Transverse dimensions of thoracic aorta as follows: 4 cm sinuses of Valsalva 3.6 cm sino-tubular junction 4.3 cm mid ascending (stable by my  measurements since prior study) 4 cm distal ascending/proximal arch 3.4 cm distal arch/proximal  descending 2.8 cm distal descending Moderate scattered calcified plaque in the arch and descending thoracic segment. Visualized proximal abdominal aorta shows similar mild atheromatous change without dilatation. Mediastinum/Nodes: No hilar or mediastinal adenopathy. Lungs/Pleura: No pleural effusion. No pneumothorax. Pulmonary emphysema most evident in the upper lobes. 7 cm subpleural nodule posterior left lower lobe image 112/8 7 mm subpleural nodule posterior right lower lobe image 115/8 7 mm pleural-based nodule posteromedial right lower lobe 4 mm nodule superior segment right lower lobe 3 mm nodule lateral left upper lobe All the above nodules were present on films dating back to 07/03/2013 and not significantly increased in size. No definite new nodule. No airspace disease. Calcified subpleural granuloma in the lateral basal segment right lower lobe. Upper Abdomen: Right renal cysts, present since 01/02/2013. No acute findings. Musculoskeletal: Spondylitic changes in the visualized lower cervical spine. Orthopedic hardware in the right scapula. No fracture or worrisome bone lesion. IMPRESSION: 1. Stable 4.3 cm ascending thoracic aortic aneurysm without complicating features. Recommend annual imaging followup by CTA or MRA. This recommendation follows 2010 ACCF/AHA/AATS/ACR/ASA/SCA/SCAI/SIR/STS/SVM Guidelines for the Diagnosis and Management of Patients  with Thoracic Aortic Disease. 2010; 121: [id number]. 2. Coronary and Aortic Atherosclerosis (ICD10-I70.0). 3. Bilateral subcentimeter pulmonary nodules stable x5 years, presumably benign. Electronically Signed   By: Lucrezia Europe M.D.   On: 06/22/2018 13:05   Disposition   Pt is being discharged home today in good condition.  Follow-up Plans & Appointments    Follow-up Information    Ledora Bottcher, PA Follow up.   Specialties: Physician Assistant, Cardiology, Radiology Why: You will have a Virtual Office Visit (in your home by phone) on 07/21/18 at  11am with Doreene Adas, PA-C. Please be available by phone 15 minutes prior to appointment. See end of this document for instructions. Contact information: [address] [place] [address] [phone]        [facility] Follow up.   Specialty: Cardiology Why: Please come to the [facility] office on Friday 07/01/18 between 8am-3pm to have your blood drawn to recheck kidney function (BMET). Please wear a mask. If you are ill, please call to reschedule. Contact information: [address] [place] [phone]       [name], MD. Schedule an appointment as soon as possible for a visit in 2 week(s).   Specialty: Internal Medicine Contact information: [place] [address] [phone]          Discharge Instructions    Diet - low sodium heart healthy   Complete by: As directed    Discharge instructions   Complete by: As directed    Two of your medicines are being stopped because your heart rate was slow. One of those medicines is metoprolol. The other medicine is one of your eye drops called Cosopt (dorzolamide/timolol) - the timolol can cause slow heart rate as well. Please call your ophthalmologist upon discharge to discuss alternative options. Keep a log of your daily heart rate and blood pressure to discuss at your follow-up visit. There is a potential your heart team might restart your metoprolol at a lower dose, so do not throw it away.  Odansetron, Golytely, and alfuzosin were removed from your medicine list since you indicated you are no longer taking them.  We updated your medicine list to reflect that you are taking 0.8mg  of tamsulosin.  No driving for 2 days. No lifting over 5 lbs for 1 week. No sexual activity for 1 week. Keep procedure site clean & dry. If you notice increased pain, swelling, bleeding or pus, call/return!  You may shower, but no soaking baths/hot  tubs/pools for 1 week.   Increase activity slowly   Complete by: As directed       Discharge Medications   Allergies as of 06/28/2018      Reactions   Doxycycline Other (See Comments)   Dizziness   Lipitor [atorvastatin] Other (See Comments)   MUSCLE ACHES   Morphine And Related Other (See Comments)   Severe headache   Niacin And Related Other (See Comments)   FLUSHING   Ramipril Cough   Sulfa Antibiotics Hives   Codeine Other (See Comments)   CONSTIPATION      Medication List    STOP taking these medications   alfuzosin 10 MG 24 hr tablet Commonly known as: UROXATRAL   dorzolamide-timolol 22.3-6.8 MG/ML ophthalmic solution Commonly known as: COSOPT   metoprolol tartrate 25 MG tablet Commonly known as: LOPRESSOR   ondansetron 4 MG tablet Commonly known as: ZOFRAN   polyethylene glycol-electrolytes 420 g solution Commonly  known as: NuLYTELY/GoLYTELY     TAKE these medications   acetaminophen 500 MG tablet Commonly known as: TYLENOL Take 1,000 mg by mouth every 6 (six) hours as needed for mild pain.   albuterol 108 (90 Base) MCG/ACT inhaler Commonly known as: VENTOLIN HFA Inhale 2 puffs into the lungs every 6 (six) hours as needed for wheezing or shortness of breath.   ALIGN PO Take 1 tablet by mouth daily.   aspirin EC 81 MG tablet Take 81 mg by mouth at bedtime.   brimonidine 0.15 % ophthalmic solution Commonly known as: ALPHAGAN Place 1 drop into the right eye 2 (two) times daily.   cyclobenzaprine 5 MG tablet Commonly known as: FLEXERIL Take 5 mg by mouth daily as needed for muscle spasms.   fenofibrate 160 MG tablet Take 1 tablet (160 mg total) by mouth daily.   fexofenadine 180 MG tablet Commonly known as: ALLEGRA Take 180 mg by mouth daily as needed for allergies or rhinitis.   Fish Oil 1200 MG Caps Take 1 capsule by mouth 2 (two) times daily.   GARLIC PO Take 1 capsule by mouth daily.   hydrocortisone ointment 0.5 % Apply 1  application topically 2 (two) times daily as needed for itching.   hyoscyamine 0.125 MG SL tablet Commonly known as: LEVSIN SL Place 1 tablet (0.125 mg total) under the tongue as needed. MUST HAVE OFFICE VISIT WITH DR PYRTLE FOR FURTHER REFILLS!!! What changed:   when to take this  reasons to take this   multivitamin with minerals Tabs tablet Take 1 tablet by mouth 2 (two) times a day.   nitroGLYCERIN 0.4 MG SL tablet Commonly known as: NITROSTAT Place 0.4 mg under the tongue every 5 (five) minutes as needed for chest pain.   Preparation H 0.25-88.44 % suppository Generic drug: shark liver oil-cocoa butter Place 1 suppository rectally daily as needed for hemorrhoids.   psyllium 58.6 % packet Commonly known as: METAMUCIL Take 1 packet by mouth daily as needed (constipation).   rosuvastatin 10 MG tablet Commonly known as: CRESTOR Take 1 tablet (10 mg total) by mouth 2 (two) times a week. Monday and thursday What changed: additional instructions   tamsulosin 0.4 MG Caps capsule Commonly known as: FLOMAX Take 2 capsules (0.8 mg total) by mouth at bedtime.        Allergies:  Allergies  Allergen Reactions   Doxycycline Other (See Comments)    Dizziness    Lipitor [Atorvastatin] Other (See Comments)    MUSCLE ACHES   Morphine And Related Other (See Comments)    Severe headache   Niacin And Related Other (See Comments)    FLUSHING   Ramipril Cough   Sulfa Antibiotics Hives   Codeine Other (See Comments)    CONSTIPATION    Outstanding Labs/Studies   BMET on Fri 6/19  Duration of Discharge Encounter   Greater than 30 minutes including physician time.  Signed, Charlie Pitter PA-C 06/28/2018, 3:22 PM

## 2018-06-28 NOTE — Telephone Encounter (Signed)
error 

## 2018-06-29 ENCOUNTER — Telehealth: Payer: Self-pay | Admitting: Internal Medicine

## 2018-06-29 ENCOUNTER — Encounter (HOSPITAL_COMMUNITY): Payer: Self-pay | Admitting: Cardiology

## 2018-06-29 ENCOUNTER — Telehealth: Payer: Self-pay | Admitting: *Deleted

## 2018-06-29 NOTE — Telephone Encounter (Signed)
Transition Care Management Follow-up Telephone Call   Date discharged? 06/28/18   How have you been since you were released from the hospital? Spoke w/wife she states husband Is doing alright   Do you understand why you were in the hospital? YES   Do you understand the discharge instructions? YES   Where were you discharged to? Home   Items Reviewed:  Medications reviewed: YES, wife states they took him off his metoprolol. She sent a msg to dr. Quay Burow letting her know  Allergies reviewed: YES  Dietary changes reviewed: YES, heart healthy  Referrals reviewed: YES, will see his cardiologist 07/21/18   Functional Questionnaire:   Activities of Daily Living (ADLs):   she states he are independent in the following: ambulation, bathing and hygiene, feeding, continence, grooming, toileting and dressing States he doesn't require assistance    Any transportation issues/concerns?: NO   Any patient concerns? NO   Confirmed importance and date/time of follow-up visits scheduled YES, wife states he already made hosp f/u appt for 07/07/18. No sxs of COVID, and also inform pt must wear face mask at appt   Provider Appointment booked with Dr. Quay Burow  Confirmed with patient if condition begins to worsen call PCP or go to the ER.  Patient was given the office number and encouraged to call back with question or concerns.  : YES

## 2018-06-29 NOTE — Telephone Encounter (Signed)
Noted.  Stopped due to bradycardia - cardio will consider restarting in future.

## 2018-06-29 NOTE — Telephone Encounter (Signed)
Pt wife is calling to let dr burns know dr peter Martinique cardiologist told her husband to stop taking metoprolol tartrate 25 mg. Pt wife does not know for how long to stop medication. Pt will have post hospital follow with dr burns soon. Pt also have an appt with dr Martinique on 07-21-2018

## 2018-06-30 ENCOUNTER — Telehealth: Payer: Self-pay | Admitting: Cardiology

## 2018-06-30 ENCOUNTER — Encounter: Payer: PPO | Admitting: Internal Medicine

## 2018-06-30 NOTE — Telephone Encounter (Signed)
Will forward to Pharm D for review  

## 2018-06-30 NOTE — Telephone Encounter (Signed)
Left message to call back  

## 2018-06-30 NOTE — Telephone Encounter (Signed)
Ok per Dr. Martinique

## 2018-06-30 NOTE — Telephone Encounter (Signed)
Yes it is ok  David Armenteros Martinique MD, [facility]

## 2018-06-30 NOTE — Telephone Encounter (Signed)
  Wife is calling because Dr Martinique told patient to stop his eye drops for his glaucoma at last visit. So they called the eye dr to get a new eye drop, Dorzolamide. They need to know from Dr Martinique that it is okay for him to use this eye drop before they pick it up from the pharmacy.

## 2018-07-01 ENCOUNTER — Other Ambulatory Visit: Payer: PPO

## 2018-07-01 NOTE — Telephone Encounter (Signed)
BP is OK. Pulse variable but not too slow  Jamise Pentland Martinique MD, [facility]

## 2018-07-01 NOTE — Telephone Encounter (Signed)
F/U Message              Patient is calling in today to let us know to call him on his cell phone, patient's home phone is not working. Pls advise

## 2018-07-01 NOTE — Telephone Encounter (Signed)
Pts wife advised about Dr. Doug Sou recommendations re: the drops..  Pt wanted Dr. Martinique to know his BP/HR... he has been mildly dizzy with rising.   6/17 130/70 HR 53 11:30am         116/73  HR 68  8:30pm  6/18  104/67 HR 81  9:30am            122/71 HR 56 7:00pm  6/19    103/75 HR 96 7:00am  Pt says he sits and rests for at least 30 min prior to checking his VS and he says he is hydrating well.   Will forward to Dr. Martinique per pt request.

## 2018-07-01 NOTE — Telephone Encounter (Signed)
Follow up  ° ° °Patients wife is returning call.  °

## 2018-07-01 NOTE — Telephone Encounter (Signed)
Pt advised and will continue to keep track and will call if he needs anything prior to his 07/21/18 appt.

## 2018-07-04 ENCOUNTER — Other Ambulatory Visit: Payer: Self-pay

## 2018-07-04 DIAGNOSIS — N183 Chronic kidney disease, stage 3 unspecified: Secondary | ICD-10-CM

## 2018-07-04 LAB — BASIC METABOLIC PANEL
BUN/Creatinine Ratio: 10 (ref 10–24)
BUN: 13 mg/dL (ref 8–27)
CO2: 22 mmol/L (ref 20–29)
Calcium: 9.5 mg/dL (ref 8.6–10.2)
Chloride: 104 mmol/L (ref 96–106)
Creatinine, Ser: 1.34 mg/dL — ABNORMAL HIGH (ref 0.76–1.27)
GFR calc Af Amer: 59 mL/min/{1.73_m2} — ABNORMAL LOW (ref >59)
GFR calc non Af Amer: 51 mL/min/{1.73_m2} — ABNORMAL LOW (ref >59)
Glucose: 86 mg/dL (ref 65–99)
Potassium: 4.1 mmol/L (ref 3.5–5.2)
Sodium: 140 mmol/L (ref 134–144)

## 2018-07-06 NOTE — Progress Notes (Signed)
Subjective:    Patient ID: David Cantrell, male    DOB: 19-Jun-1940, 78 y.o.   MRN: [id number]  HPI The patient is here for follow up from the hospital.   Admitted 06/27/18 - 06/28/18  He went to the ED for exertional SOB and chest tightness.  He had seen Dr [name] for follow up of his asc aortic aneurysm and reported symptoms concerning for angina.  He followed up with cardiology on 6/12 and reported increased fatigue, SOB and chest tightness while doing yardwork.  He reported radiation down his left arm.  This has progressed since the beginning of the year.  He has SOB and dizziness if he bends over and stands up.  EKG showed no changed, but there was concern for unstable angina - catherization was recommended.  He was admitted for pre-cath hydration.  Cath showed no significant change from his cardiac catheterization 2012.  Medical treatment was advised.  His HR was low is metoprolol was held.  It is unsure if this is contributing to his symptoms.  Cardiology also recommended to holding cosopt.  Cardiology recommneded if dizzinesss unopn standing continued to consider lower dose of flomax.  It was advised he follow up with me to discuss possibility of COPD and further evaluation of the shortness of breath.     Cath 06/28/18 Conclusion    Prox LAD to Mid LAD lesion is 40% stenosed.  Mid LAD lesion is 50% stenosed.  LPDA lesion is 65% stenosed.  The left ventricular systolic function is normal.  LV end diastolic pressure is normal.  The left ventricular ejection fraction is 55-65% by visual estimate.  1. Nonobstructive CAD 2. Normal LV function 3. Normal LVEDP  Plan: compared to prior cardiac cath in 2012 there is no significant change. Continue medical therapy. Consider other causes of dyspnea. Anticipate DC later today after 4 hours hydration     SOB: if he does yard work and bends over he gets winded and sometimes dizzy.  He sometimes has to take deep breaths when sitting  - like it feels like the air is not enough.  He does not get short of breath walking in the house.  He is not currently exercising on a regular basis.  He is evidence of COPD on imaging.  He had PFTs a few years ago that showed mild changes due to emphysema.  He has used an albuterol inhaler and he feels like that does help.  He has used Advair and Breo in the past and did not feel like they helped.  Lightheadedness:   He denies a true spinning sensation.  He experiences lightheadedness when he bends over and gets back up and after sitting for awhile.  He thinks it is better since stopping the metoprolol, but he has still had it.  He did try taking only 1 of the Flomax pills, but this did affect his urination and he went back to taking 2.  He does stay well-hydrated.      Medications and allergies reviewed with patient and updated if appropriate.  Patient Active Problem List   Diagnosis Date Noted  . Sinus bradycardia 06/28/2018  . Chest tightness 06/28/2018  . CKD (chronic kidney disease) stage 3, GFR 30-59 ml/min (HCC)   . CKD (chronic kidney disease), stage III ([place]) 06/27/2018  . Rib pain on right side 10/11/2017  . Diverticulitis 10/11/2017  . Lumbar back pain 04/13/2017  . Piriformis syndrome 01/18/2017  . Increased frequency of urination 10/13/2016  .  Thoracic aortic aneurysm ([place]) 04/12/2016  . Malignant neoplasm of prostate ([place]) 01/31/2016  . Subacute maxillary sinusitis 06/19/2015  . Centrilobular emphysema ([place]) 12/12/2014  . Bronchiectasis without acute exacerbation ([place]) 09/11/2014  . Bulging lumbar disc 10/03/2013  . Open-angle glaucoma 05/05/2013  . 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 Multiple pulmonary nodules 02/24/2013  . Abnormal CT scan, kidney 02/24/2013  . Basal cell carcinoma of ala nasi 02/24/2013  . At risk for obstructive sleep apnea 02/09/2013  . 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 COLONIC POLYPS, HX OF 12/13/2007  . Hyperlipidemia 12/03/2006   . Essential hypertension 12/03/2006  . Coronary atherosclerosis 12/03/2006  . 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 Nevus, non-neoplastic 09/24/2006  . RHINITIS, ALLERGIC NOS 09/24/2006  . Exercise-induced bronchospasm 09/24/2006    Current Outpatient Medications on File Prior to Visit  Medication Sig Dispense Refill  . acetaminophen (TYLENOL) 500 MG tablet Take 1,000 mg by mouth every 6 (six) hours as needed for mild pain.     Marland Kitchen albuterol (PROVENTIL HFA;VENTOLIN HFA) 108 (90 BASE) MCG/ACT inhaler Inhale 2 puffs into the lungs every 6 (six) hours as needed for wheezing or shortness of breath. 1 Inhaler 2  . aspirin EC 81 MG tablet Take 81 mg by mouth at bedtime.     . brimonidine (ALPHAGAN) 0.15 % ophthalmic solution Place 1 drop into the right eye 2 (two) times daily.     . cyclobenzaprine (FLEXERIL) 5 MG tablet Take 5 mg by mouth daily as needed for muscle spasms.    . fenofibrate 160 MG tablet Take 1 tablet (160 mg total) by mouth daily. 90 tablet 3  . fexofenadine (ALLEGRA) 180 MG tablet Take 180 mg by mouth daily as needed for allergies or rhinitis.    Marland Kitchen GARLIC PO Take 1 capsule by mouth daily.    . hydrocortisone ointment 0.5 % Apply 1 application topically 2 (two) times daily as needed for itching.     . hyoscyamine (LEVSIN SL) 0.125 MG SL tablet Place 1 tablet (0.125 mg total) under the tongue as needed. MUST HAVE OFFICE VISIT WITH DR PYRTLE FOR FURTHER REFILLS!!! (Patient taking differently: Place 0.125 mg under the tongue daily as needed for cramping. MUST HAVE OFFICE VISIT WITH DR PYRTLE FOR FURTHER REFILLS!!!) 20 tablet 0  . Multiple Vitamin (MULTIVITAMIN WITH MINERALS) TABS tablet Take 1 tablet by mouth 2 (two) times a day.    . nitroGLYCERIN (NITROSTAT) 0.4 MG SL tablet Place 0.4 mg under the tongue every 5 (five) minutes as needed for chest pain.    . Omega-3 Fatty Acids (FISH OIL) 1200 MG CAPS Take 1 capsule by mouth 2 (two) times daily.     . Probiotic  Product (ALIGN PO) Take 1 tablet by mouth daily.     . psyllium (METAMUCIL) 58.6 % packet Take 1 packet by mouth daily as needed (constipation).     . rosuvastatin (CRESTOR) 10 MG tablet Take 1 tablet (10 mg total) by mouth 2 (two) times a week. Monday and thursday (Patient taking differently: Take 10 mg by mouth 2 (two) times a week. Monday and Thursday nights) 30 tablet 6  . shark liver oil-cocoa butter (PREPARATION H) 0.25-88.44 % suppository Place 1 suppository rectally daily as needed for hemorrhoids.     . tamsulosin (FLOMAX) 0.4 MG CAPS capsule Take 2 capsules (0.8 mg total) by mouth at bedtime.     No current facility-administered medications on file prior to visit.  Past Medical History:  Diagnosis Date  . Allergy   . Aortic ectasia (HCC)    a. 2.5cm aortic ectasia by CT 05/2017 (follow-up due 05/2022).  Marland Kitchen Arthritis   . Bronchiectasis ([place])    per pulmologist note -- dr Lake Bells-- related to multiple episodes pneumonia as adult  . CAD (coronary artery disease)    a. L PDA obstructive disease 2012, managed medically.  . Cancer ([place])    basal cell ca  . Cataract   . Centrilobular emphysema ([place])   . Congenital renal cyst   . COPD (chronic obstructive pulmonary disease) (HCC)    pulmologist-  dr Lake Bells  . Diverticulosis of colon   . Emphysema of lung ([place])   . First degree heart block   . Hemorrhoids   . History of adenomatous polyp of colon    hx multiple tubular adenoma's and hyperplastic polyp's since 2001--- last colonoscopy 05-29-2015  . History of basal cell carcinoma excision    10-31-2012  nose  . History of external beam radiation therapy 02-01-2016  to 03-16-2016   prostate 45Gy  plus radioactive prostate seed implants 04-07-2016  . History of kidney stones   . Hyperlipidemia   . Hypertension   . Multiple pulmonary nodules    S/P  BILATERAL BRONCHOSCOPY  09-24-2014  no infection --    . Open-angle glaucoma    RIGHT EYE ONLY/OPEN ANGLE  . Prostate cancer  [facility]) urologist-  dr eskridge/  oncologist-  dr Tammi Klippel   Stage T1c,  Gleason3+4, PSA 4.7--  s/p  external beam radiation therapy 02-01-2016 to 03-16-2016  . Seasonal allergies   . Thoracic aortic aneurysm ([place])    a. [name] following -4.3cm by CT 06/2018.  Marland Kitchen Wears dentures    full upper and lower parital    Past Surgical History:  Procedure Laterality Date  . CARDIAC CATHETERIZATION  09-04-2002  DR Wynonia Lawman   NORMAL LVF/  MILD TO MODERATE CAD INVOLVING PROXIMAL 40%  AND MID 40%  LAD/  POSTERIOR DESCENDING CX 70%  . CARDIAC CATHETERIZATION  06-05-2010  DR Fidela Juneau   NORMAL LM/  40% PROXIMAL MID LAD/ 70% DISTAL CFX/  80% PROXIMAL PDA/  SMALL & NONDOMINANT RCA  . CARDIOVASCULAR STRESS TEST  06-01-2013   dr Wynonia Lawman   normal lexiscan myoview w/ no ischemia/ normal LV function and wall motion, ef 61%  . COLONOSCOPY    . COLONOSCOPY W/ POLYPECTOMY  last one 05-29-2015  . CYSTOSCOPY N/A 04/07/2016   Procedure: CYSTOSCOPY FLEXIBLE;  Surgeon: Festus Aloe, MD;  Location: [facility];  Service: Urology;  Laterality: N/A;  no seeds found in bladder  . CYSTOSCOPY WITH RETROGRADE PYELOGRAM, URETEROSCOPY AND STENT PLACEMENT Right 02/02/2013   Procedure: CYSTOSCOPY WITH RETROGRADE PYELOGRAM, AND Right STENT PLACEMENT;  Surgeon: Molli Hazard, MD;  Location: [facility];  Service: Urology;  Laterality: Right;  . CYSTOSCOPY WITH RETROGRADE PYELOGRAM, URETEROSCOPY AND STENT PLACEMENT Right 02/15/2013   Procedure: CYSTOSCOPY WITH RETROGRADE PYELOGRAM, URETEROSCOPY AND STENT EXCHANGE;  Surgeon: Molli Hazard, MD;  Location: [facility];  Service: Urology;  Laterality: Right;  . ESI     ; for cervical radiculopathy  . HEMORRHOID SURGERY  07-03-1999  . HOLMIUM LASER APPLICATION Right 01/17/1094   Procedure: HOLMIUM LASER APPLICATION;  Surgeon: Molli Hazard, MD;  Location: [facility];  Service: Urology;  Laterality: Right;  . LEFT HEART CATH AND  CORONARY ANGIOGRAPHY N/A 06/28/2018   Procedure: LEFT HEART CATH AND CORONARY  ANGIOGRAPHY;  Surgeon: Martinique, Peter M, MD;  Location: [facility];  Service: Cardiovascular;  Laterality: N/A;  . PROSTATE BIOPSY    . RADIOACTIVE SEED IMPLANT N/A 04/07/2016   Procedure: RADIOACTIVE SEED IMPLANT/BRACHYTHERAPY IMPLANT;  Surgeon: Festus Aloe, MD;  Location: [facility];  Service: Urology;  Laterality: N/A;  55 seeds implanted  . SHOULDER OPEN ROTATOR CUFF REPAIR Bilateral RIGHT X2  LAST ONE 1997/  LEFT 2005   right inferior glenoid screw retained  . VIDEO BRONCHOSCOPY Bilateral 09/24/2014   Procedure: VIDEO BRONCHOSCOPY WITH FLUORO;  Surgeon: Juanito Doom, MD;  Location: [place];  Service: Cardiopulmonary;  Laterality: Bilateral;    Social History   Socioeconomic History  . Marital status: Married    Spouse name: Not on file  . Number of children: 3  . Years of education: Not on file  . Highest education level: Not on file  Occupational History  . Not on file  Social Needs  . Financial resource strain: Not on file  . Food insecurity    Worry: Not on file    Inability: Not on file  . Transportation needs    Medical: Not on file    Non-medical: Not on file  Tobacco Use  . Smoking status: Former Smoker    Packs/day: 2.00    Years: 3.00    Pack years: 6.00    Types: Cigarettes    Quit date: 01/12/1970    Years since quitting: 48.5  . Smokeless tobacco: Never Used  Substance and Sexual Activity  . Alcohol use: No    Alcohol/week: 0.0 standard drinks  . Drug use: No  . Sexual activity: Not on file  Lifestyle  . Physical activity    Days per week: Not on file    Minutes per session: Not on file  . Stress: Not on file  Relationships  . Social Herbalist on phone: Not on file    Gets together: Not on file    Attends religious service: Not on file    Active member of club or organization: Not on file    Attends meetings of clubs or  organizations: Not on file    Relationship status: Not on file  Other Topics Concern  . Not on file  Social History Narrative  . Not on file    Family History  Problem Relation Age of Onset  . Stroke Father        in 69s  . Colon cancer Father        upper 45's  . Cancer Father        colon  . Heart attack Brother 56       stent  . Cancer Brother        pre cancerous bladder lesion  . Diabetes Paternal Uncle   . Cancer Cousin        prostate  . Cancer Other        bladder  . Arthritis Neg Hx   . Asthma Neg Hx   . COPD Neg Hx   . Rectal cancer Neg Hx   . Stomach cancer Neg Hx   . Esophageal cancer Neg Hx     Review of Systems  Constitutional: Negative for chills and fever.  HENT: Negative for congestion, postnasal drip and sore throat.   Respiratory: Positive for shortness of breath. Negative for cough, chest tightness (none since prior to cath) and wheezing.   Cardiovascular: Negative for chest pain,  palpitations and leg swelling.  Gastrointestinal: Negative for abdominal pain and nausea.  Neurological: Positive for light-headedness (with standing or after bending - transient). Negative for headaches.  Psychiatric/Behavioral: Negative for dysphoric mood. The patient is not nervous/anxious.        Objective:   Vitals:   07/07/18 0942  BP: 108/62  Pulse: 81  Resp: 18  Temp: 98.1 F (36.7 C)  SpO2: 97%   BP Readings from Last 3 Encounters:  07/07/18 108/62  06/28/18 (!) 99/53  06/24/18 125/72   Wt Readings from Last 3 Encounters:  07/07/18 221 lb (100.2 kg)  06/28/18 220 lb (99.8 kg)  06/24/18 222 lb 3.2 oz (100.8 kg)   Body mass index is 29.16 kg/m.   Physical Exam    Constitutional: Appears well-developed and well-nourished. No distress.  HENT:  Head: Normocephalic and atraumatic.  Neck: Neck supple. No tracheal deviation present. No thyromegaly present.  No cervical lymphadenopathy Cardiovascular: Normal rate, regular rhythm and normal heart  sounds.  No murmur heard. No carotid bruit .  No edema Pulmonary/Chest: Effort normal and breath sounds normal. No respiratory distress. No has no wheezes. No rales.  Skin: Skin is warm and dry. Not diaphoretic.  Psychiatric: Normal mood and affect. Behavior is normal.      Assessment & Plan:    See Problem List for Assessment and Plan of chronic medical problems.

## 2018-07-07 ENCOUNTER — Ambulatory Visit (INDEPENDENT_AMBULATORY_CARE_PROVIDER_SITE_OTHER): Payer: PPO | Admitting: Internal Medicine

## 2018-07-07 ENCOUNTER — Telehealth: Payer: Self-pay

## 2018-07-07 ENCOUNTER — Other Ambulatory Visit: Payer: Self-pay

## 2018-07-07 ENCOUNTER — Encounter: Payer: Self-pay | Admitting: Internal Medicine

## 2018-07-07 VITALS — BP 108/62 | HR 81 | Temp 98.1°F | Resp 18 | Ht 73.0 in | Wt 221.0 lb

## 2018-07-07 DIAGNOSIS — R0602 Shortness of breath: Secondary | ICD-10-CM

## 2018-07-07 DIAGNOSIS — J432 Centrilobular emphysema: Secondary | ICD-10-CM

## 2018-07-07 DIAGNOSIS — R42 Dizziness and giddiness: Secondary | ICD-10-CM

## 2018-07-07 MED ORDER — ALBUTEROL SULFATE HFA 108 (90 BASE) MCG/ACT IN AERS: 2.0000 | INHALATION_SPRAY | Freq: Four times a day (QID) | RESPIRATORY_TRACT | 5 refills | Status: DC | PRN

## 2018-07-07 MED ORDER — BREO ELLIPTA 200-25 MCG/INH IN AEPB: 1.0000 | INHALATION_SPRAY | Freq: Every day | RESPIRATORY_TRACT | 5 refills | Status: DC

## 2018-07-07 MED ORDER — PROAIR HFA 108 (90 BASE) MCG/ACT IN AERS: 1.0000 | INHALATION_SPRAY | Freq: Four times a day (QID) | RESPIRATORY_TRACT | 5 refills | Status: AC | PRN

## 2018-07-07 NOTE — Patient Instructions (Addendum)
Start using the Breo inhaler once a day to see if that helps your shortness of breath.  Use the proair rescue inhaler only as needed for shortness of breath.    Start exercising more - increase your exercise slowly.      Talk to urology about the flomax which may be contributing to the dizziness/lightheadedness.

## 2018-07-07 NOTE — Telephone Encounter (Signed)
Received fax from [place] at [employer] stating that the inhaler that was sent in was not covered by insurance. They will cover Proair. Will you send that instead?

## 2018-07-10 DIAGNOSIS — R0602 Shortness of breath: Secondary | ICD-10-CM | POA: Insufficient documentation

## 2018-07-10 DIAGNOSIS — R42 Dizziness and giddiness: Secondary | ICD-10-CM | POA: Insufficient documentation

## 2018-07-10 NOTE — Assessment & Plan Note (Signed)
Imaging has showed centrilobular emphysema PFT showed mild changes related to emphysema a few years ago Shortness of breath may partially be related to emphysema He would prefer not to see pulmonary again at this time He is willing to try a daily medication again to see if this is effective-start Breo daily-advised to use on a daily basis for 2 weeks to really know if this helps or not Continue albuterol as needed, which she does find effective

## 2018-07-10 NOTE — Assessment & Plan Note (Signed)
He has shortness of breath with activity and at times feels like he needs to take couple of extra deep breaths even when sitting His emphysema may be contributing-retry daily inhaler to see if that helps, albuterol as needed He has not found a daily inhaler to be effective in the past, but will take daily for 2 weeks to reassess Discussed with him that his sedentary lifestyle is likely contributing to his shortness of breath He will try to slowly increase his activity level to see if that helps Consider pulmonary referral again if there is no improvement in the shortness of breath to reevaluate his COPD

## 2018-07-10 NOTE — Assessment & Plan Note (Signed)
Experiencing lightheadedness with bending over or standing up-postural lightheadedness He thinks this is better since metoprolol was discontinued He is currently taking Flomax, which is likely contributing-advised discussing trying a different medication with urology Discussed staying well-hydrated and pumping his legs before he stands up

## 2018-07-18 ENCOUNTER — Telehealth: Payer: Self-pay | Admitting: Cardiology

## 2018-07-18 NOTE — Telephone Encounter (Signed)
  Wife is calling because she would like to come in with patient for his visit on 07/21/18 because he is hard of hearing. Please let her know if this is possible.

## 2018-07-19 NOTE — Telephone Encounter (Signed)
Spoke to [facility], she was disappointed she could not come to husbands app, but will accept a call from Fabian Sharp PA at time of visit so she can help her husband hear. I will forward msg to Fabian Sharp PA for 07/21/2018 @ 11;00  Appointment so she can speak with both of them at time of visit.

## 2018-07-20 ENCOUNTER — Telehealth: Payer: Self-pay | Admitting: Physician Assistant

## 2018-07-20 NOTE — Progress Notes (Signed)
Cardiology Office Note:    Date:  07/21/2018   ID:  David Cantrell, DOB 02/04/1940, MRN [id number]  PCP:  [name], MD  Cardiologist:  Peter Martinique, MD   Referring MD: [name], MD   Chief Complaint  Patient presents with  . Follow-up    Post cath.  . Shortness of Breath    When bending.  . Chest Pain    When bending.    History of Present Illness:    David Cantrell is a 78 y.o. male with a history of CAD in 2012 by cath (left PDA) managed medically, HTN, HLD, CKD stage III, COPD, first degree AV block, ascending aortic aneurysm (4.3 cm by CT 6/20, followed by 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 Follow up myoview in 2015 normal. I saw him in clinic 06/24/18 and he complained of symptoms concerning for stable angina. CT chest performed by [name] also showed possible progression of coronary calcification. Decision was made to admit for hydration and obtain angiography. He [facility] to [facility]18 for hydration and had left heart catheterization on 06/28/18. LHC showed nonobstructive disease: 40% stenosis in the proximal to mid LAD followed by 50% stenosis in the mid LAD, LPDA 65% stenosis, normal left ventricular systolic function and normal LVEDP. He tolerated the procedure well and was discharged same day after 4 hrs of hydration. Lopressor was D/C'ed for bradycardia, as well as cosopt.   He did complain of dizziness upon standing and was noted to be on 0.8 mg flomax. Discussed that this may be the cause of his dizziness. He was also advised to follow up with PCP for COPD regimen as this could also be contributing to his SOB. He does not wish to return to his pulmonologist.   He returns today for post-cath follow up.  Right radial access site C/D/I. No chest pain. Slight dizziness upon standing has improved. He is still taking 0.8 mg flomax and will see urology soon. We had a long discussion about increasing his physical activity.  He mows two yards and would like to push mow instead of ride - I agreed with push mowing level ground. He will also try to increase his walking program.   He needs to have a colonoscopy.    Past Medical History:  Diagnosis Date  . Allergy   . Aortic ectasia (HCC)    a. 2.5cm aortic ectasia by CT 05/2017 (follow-up due 05/2022).  Marland Kitchen Arthritis   . Bronchiectasis ([place])    per pulmologist note -- dr Lake Bells-- related to multiple episodes pneumonia as adult  . CAD (coronary artery disease)    a. L PDA obstructive disease 2012, managed medically.  . Cancer ([place])    basal cell ca  . Cataract   . Centrilobular emphysema ([place])   . Congenital renal cyst   . COPD (chronic obstructive pulmonary disease) (HCC)    pulmologist-  dr Lake Bells  . Diverticulosis of colon   . Emphysema of lung ([place])   . First degree heart block   . Hemorrhoids   . History of adenomatous polyp of colon    hx multiple tubular adenoma's and hyperplastic polyp's since 2001--- last colonoscopy 05-29-2015  . History of basal cell carcinoma excision    10-31-2012  nose  . History of external beam radiation therapy 02-01-2016  to 03-16-2016   prostate 45Gy  plus radioactive prostate seed implants 04-07-2016  . History of kidney stones   .  Hyperlipidemia   . Hypertension   . Multiple pulmonary nodules    S/P  BILATERAL BRONCHOSCOPY  09-24-2014  no infection --    . Open-angle glaucoma    RIGHT EYE ONLY/OPEN ANGLE  . Prostate cancer [facility]) urologist-  dr eskridge/  oncologist-  dr Tammi Klippel   Stage T1c,  Gleason3+4, PSA 4.7--  s/p  external beam radiation therapy 02-01-2016 to 03-16-2016  . Seasonal allergies   . Thoracic aortic aneurysm ([place])    a. [name] following -4.3cm by CT 06/2018.  Marland Kitchen Wears dentures    full upper and lower parital    Past Surgical History:  Procedure Laterality Date  . CARDIAC CATHETERIZATION  09-04-2002  DR Wynonia Lawman   NORMAL LVF/  MILD TO MODERATE CAD INVOLVING PROXIMAL 40%  AND MID 40%  LAD/   POSTERIOR DESCENDING CX 70%  . CARDIAC CATHETERIZATION  06-05-2010  DR Fidela Juneau   NORMAL LM/  40% PROXIMAL MID LAD/ 70% DISTAL CFX/  80% PROXIMAL PDA/  SMALL & NONDOMINANT RCA  . CARDIOVASCULAR STRESS TEST  06-01-2013   dr Wynonia Lawman   normal lexiscan myoview w/ no ischemia/ normal LV function and wall motion, ef 61%  . COLONOSCOPY    . COLONOSCOPY W/ POLYPECTOMY  last one 05-29-2015  . CYSTOSCOPY N/A 04/07/2016   Procedure: CYSTOSCOPY FLEXIBLE;  Surgeon: Festus Aloe, MD;  Location[facility];  Service: Urology;  Laterality: N/A;  no seeds found in bladder  . CYSTOSCOPY WITH RETROGRADE PYELOGRAM, URETEROSCOPY AND STENT PLACEMENT Right 02/02/2013   Procedure: CYSTOSCOPY WITH RETROGRADE PYELOGRAM, AND Right STENT PLACEMENT;  Surgeon: Molli Hazard, MD;  Location: [facility];  Service: Urology;  Laterality: Right;  . CYSTOSCOPY WITH RETROGRADE PYELOGRAM, URETEROSCOPY AND STENT PLACEMENT Right 02/15/2013   Procedure: CYSTOSCOPY WITH RETROGRADE PYELOGRAM, URETEROSCOPY AND STENT EXCHANGE;  Surgeon: Molli Hazard, MD;  Location: [facility];  Service: Urology;  Laterality: Right;  . ESI     ; for cervical radiculopathy  . HEMORRHOID SURGERY  07-03-1999  . HOLMIUM LASER APPLICATION Right 06/14/7856   Procedure: HOLMIUM LASER APPLICATION;  Surgeon: Molli Hazard, MD;  Location: [facility];  Service: Urology;  Laterality: Right;  . LEFT HEART CATH AND CORONARY ANGIOGRAPHY N/A 06/28/2018   Procedure: LEFT HEART CATH AND CORONARY ANGIOGRAPHY;  Surgeon: Martinique, Peter M, MD;  Location: [place] [facility];  Service: Cardiovascular;  Laterality: N/A;  . PROSTATE BIOPSY    . RADIOACTIVE SEED IMPLANT N/A 04/07/2016   Procedure: RADIOACTIVE SEED IMPLANT/BRACHYTHERAPY IMPLANT;  Surgeon: Festus Aloe, MD;  Location: [facility];  Service: Urology;  Laterality: N/A;  55 seeds implanted  . SHOULDER OPEN ROTATOR CUFF REPAIR Bilateral RIGHT  X2  LAST ONE 1997/  LEFT 2005   right inferior glenoid screw retained  . VIDEO BRONCHOSCOPY Bilateral 09/24/2014   Procedure: VIDEO BRONCHOSCOPY WITH FLUORO;  Surgeon: Juanito Doom, MD;  Location: [place];  Service: Cardiopulmonary;  Laterality: Bilateral;    Current Medications: Current Meds  Medication Sig  . acetaminophen (TYLENOL) 500 MG tablet Take 1,000 mg by mouth every 6 (six) hours as needed for mild pain.   Marland Kitchen aspirin EC 81 MG tablet Take 81 mg by mouth at bedtime.   . brimonidine (ALPHAGAN) 0.15 % ophthalmic solution Place 1 drop into the right eye 2 (two) times daily.   . cyclobenzaprine (FLEXERIL) 5 MG tablet Take 5 mg by mouth daily as needed for muscle spasms.  . fenofibrate 160 MG  tablet Take 1 tablet (160 mg total) by mouth daily.  . fexofenadine (ALLEGRA) 180 MG tablet Take 180 mg by mouth daily as needed for allergies or rhinitis.  . fluticasone furoate-vilanterol (BREO ELLIPTA) 200-25 MCG/INH AEPB Inhale 1 puff into the lungs daily.  Marland Kitchen GARLIC PO Take 1 capsule by mouth daily.  . hydrocortisone ointment 0.5 % Apply 1 application topically 2 (two) times daily as needed for itching.   . hyoscyamine (LEVSIN SL) 0.125 MG SL tablet Place 1 tablet (0.125 mg total) under the tongue as needed. MUST HAVE OFFICE VISIT WITH DR PYRTLE FOR FURTHER REFILLS!!! (Patient taking differently: Place 0.125 mg under the tongue daily as needed for cramping. MUST HAVE OFFICE VISIT WITH DR PYRTLE FOR FURTHER REFILLS!!!)  . Multiple Vitamin (MULTIVITAMIN WITH MINERALS) TABS tablet Take 1 tablet by mouth 2 (two) times a day.  . nitroGLYCERIN (NITROSTAT) 0.4 MG SL tablet Place 0.4 mg under the tongue every 5 (five) minutes as needed for chest pain.  . Omega-3 Fatty Acids (FISH OIL) 1200 MG CAPS Take 1 capsule by mouth 2 (two) times daily.   Marland Kitchen PROAIR HFA 108 (90 Base) MCG/ACT inhaler Inhale 1-2 puffs into the lungs every 6 (six) hours as needed for wheezing or shortness of breath.  . Probiotic  Product (ALIGN PO) Take 1 tablet by mouth daily.   . psyllium (METAMUCIL) 58.6 % packet Take 1 packet by mouth daily as needed (constipation).   . rosuvastatin (CRESTOR) 10 MG tablet Take 1 tablet (10 mg total) by mouth 2 (two) times a week. Monday and thursday (Patient taking differently: Take 10 mg by mouth 2 (two) times a week. Monday and Thursday nights)  . shark liver oil-cocoa butter (PREPARATION H) 0.25-88.44 % suppository Place 1 suppository rectally daily as needed for hemorrhoids.   . tamsulosin (FLOMAX) 0.4 MG CAPS capsule Take 2 capsules (0.8 mg total) by mouth at bedtime.     Allergies:   Doxycycline, Lipitor [atorvastatin], Morphine and related, Niacin and related, Ramipril, Sulfa antibiotics, and Codeine   Social History   Socioeconomic History  . Marital status: Married    Spouse name: Not on file  . Number of children: 3  . Years of education: Not on file  . Highest education level: Not on file  Occupational History  . Not on file  Social Needs  . Financial resource strain: Not on file  . Food insecurity    Worry: Not on file    Inability: Not on file  . Transportation needs    Medical: Not on file    Non-medical: Not on file  Tobacco Use  . Smoking status: Former Smoker    Packs/day: 2.00    Years: 3.00    Pack years: 6.00    Types: Cigarettes    Quit date: 01/12/1970    Years since quitting: 48.5  . Smokeless tobacco: Never Used  Substance and Sexual Activity  . Alcohol use: No    Alcohol/week: 0.0 standard drinks  . Drug use: No  . Sexual activity: Not on file  Lifestyle  . Physical activity    Days per week: Not on file    Minutes per session: Not on file  . Stress: Not on file  Relationships  . Social Herbalist on phone: Not on file    Gets together: Not on file    Attends religious service: Not on file    Active member of club or organization: Not on file  Attends meetings of clubs or organizations: Not on file    Relationship  status: Not on file  Other Topics Concern  . Not on file  Social History Narrative  . Not on file     Family History: The patient's family history includes Cancer in his brother, cousin, father, and another family member; Colon cancer in his father; Diabetes in his paternal uncle; Heart attack (age of onset: 44) in his brother; Stroke in his father. There is no history of Arthritis, Asthma, COPD, Rectal cancer, Stomach cancer, or Esophageal cancer.  ROS:   Please see the history of present illness.     All other systems reviewed and are negative.  EKGs/Labs/Other Studies Reviewed:    The following studies were reviewed today:  1. Cath 06/28/18 Conclusion    Prox LAD to Mid LAD lesion is 40% stenosed.  Mid LAD lesion is 50% stenosed.  LPDA lesion is 65% stenosed.  The left ventricular systolic function is normal.  LV end diastolic pressure is normal.  The left ventricular ejection fraction is 55-65% by visual estimate.  1. Nonobstructive CAD 2. Normal LV function 3. Normal LVEDP  Plan: compared to prior cardiac cath in 2012 there is no significant change. Continue medical therapy. Consider other causes of dyspnea. Anticipate DC later today after 4 hours hydration     EKG:  EKG is not ordered today.    Recent Labs: 06/28/2018: ALT 15; Hemoglobin 11.7; Platelets 217 07/04/2018: BUN 13; Creatinine, Ser 1.34; Potassium 4.1; Sodium 140  Recent Lipid Panel    Component Value Date/Time   CHOL 96 06/28/2018 0603   CHOL 135 01/17/2018 0906   TRIG 87 06/28/2018 0603   TRIG 116 01/15/2006 0828   HDL 23 (L) 06/28/2018 0603   HDL 35 (L) 01/17/2018 0906   CHOLHDL 4.2 06/28/2018 0603   VLDL 17 06/28/2018 0603   LDLCALC 56 06/28/2018 0603   LDLCALC 74 01/17/2018 0906   LDLDIRECT 74.7 02/21/2009 0932    Physical Exam:    VS:  BP 108/60 (BP Location: Left Arm, Patient Position: Sitting, Cuff Size: Normal)   Pulse 74   Temp (!) 97.3 F (36.3 C)   Ht 6\' 1"  (1.854 m)    Wt 222 lb (100.7 kg)   BMI 29.29 kg/m     Wt Readings from Last 3 Encounters:  07/21/18 222 lb (100.7 kg)  07/07/18 221 lb (100.2 kg)  06/28/18 220 lb (99.8 kg)     GEN: elderly male in no acute distress HEENT: Normal NECK: No JVD CARDIAC: RRR, no murmurs, rubs, gallops RESPIRATORY:  Clear to auscultation without rales, wheezing or rhonchi  ABDOMEN: Soft, non-tender, non-distended MUSCULOSKELETAL:  No edema; No deformity  SKIN: Warm and dry NEUROLOGIC:  Alert and oriented x 3 PSYCHIATRIC:  Normal affect   ASSESSMENT:    1. Preoperative clearance   2. Coronary artery disease involving native coronary artery of native heart without angina pectoris   3. Essential hypertension   4. Hypertensive heart disease with heart failure ([place])   5. CKD (chronic kidney disease), stage II   6. Pure hypercholesterolemia   7. Chronic obstructive pulmonary disease, unspecified COPD type ([place])    PLAN:    In order of problems listed above:  Preoperative evaluation [facility] - pending colonoscopy Patient was just evaluated for CAD with nonobstructive disease. He is slowly working up to 4.0 METS. SOB from COPD. No chest pain. He is at acceptable risk to proceed with colonoscopy. I will fax  this note to [facility].    Nonobstructive CAD, LHC 06/2018 essentially unchanged from 2012 cath - lopressor and cosopt stopped for bradycardia and dizziness upon standing - continue ASA   Hyperlipidemia 06/28/2018: Cholesterol 96; HDL 23; LDL Cholesterol 56; Triglycerides 87; VLDL 17 - continue fenofibrate, fish oil, and crestor   Hypertension - has complained of dizziness upon standing - no anti-hypertensives   CKD stage III - sCr 1.34 post cath, stable and at baseline   COPD - agreed to try daily inhaler with PCP - agreed to start a walking program for sedentary lifestyle   Follow up in three months.   Medication Adjustments/Labs and Tests Ordered: Current medicines are reviewed at  length with the patient today.  Concerns regarding medicines are outlined above.  No orders of the defined types were placed in this encounter.  No orders of the defined types were placed in this encounter.   Signed, Tami Lin Dniya Neuhaus, PA  07/21/2018 1:32 PM    [facility]

## 2018-07-20 NOTE — Telephone Encounter (Signed)
I called pt to confirm his appt with Fabian Sharp on 07-21-18.

## 2018-07-21 ENCOUNTER — Telehealth: Payer: Self-pay | Admitting: Physician Assistant

## 2018-07-21 ENCOUNTER — Encounter: Payer: Self-pay | Admitting: Physician Assistant

## 2018-07-21 ENCOUNTER — Other Ambulatory Visit: Payer: Self-pay

## 2018-07-21 ENCOUNTER — Telehealth: Payer: Self-pay

## 2018-07-21 ENCOUNTER — Ambulatory Visit (INDEPENDENT_AMBULATORY_CARE_PROVIDER_SITE_OTHER): Payer: PPO | Admitting: Physician Assistant

## 2018-07-21 VITALS — BP 108/60 | HR 74 | Temp 97.3°F | Ht 73.0 in | Wt 222.0 lb

## 2018-07-21 DIAGNOSIS — I10 Essential (primary) hypertension: Secondary | ICD-10-CM

## 2018-07-21 DIAGNOSIS — I110 Hypertensive heart disease with heart failure: Secondary | ICD-10-CM | POA: Diagnosis not present

## 2018-07-21 DIAGNOSIS — J449 Chronic obstructive pulmonary disease, unspecified: Secondary | ICD-10-CM

## 2018-07-21 DIAGNOSIS — I2510 Atherosclerotic heart disease of native coronary artery without angina pectoris: Secondary | ICD-10-CM | POA: Diagnosis not present

## 2018-07-21 DIAGNOSIS — Z01818 Encounter for other preprocedural examination: Secondary | ICD-10-CM

## 2018-07-21 DIAGNOSIS — E7800 Pure hypercholesterolemia, unspecified: Secondary | ICD-10-CM | POA: Diagnosis not present

## 2018-07-21 DIAGNOSIS — N182 Chronic kidney disease, stage 2 (mild): Secondary | ICD-10-CM | POA: Diagnosis not present

## 2018-07-21 NOTE — Telephone Encounter (Signed)
New message:    Patient wife calling concering patient getting a oxygen monitor and need a prescription for one they found at [facility] supply has. Please call if that possible they will come pick it up.

## 2018-07-21 NOTE — Telephone Encounter (Signed)
New message:    Patient wife calling stating some one called her this morning I do not see a note only from yesterday.

## 2018-07-21 NOTE — Telephone Encounter (Signed)
Called patient back, they are looking for Pulse OX, but states that they have called around and nobody has one anywhere else, but they found one at [facility]- but he has to have an RX for it. Okay to do RX for pulse ox. Thanks!

## 2018-07-21 NOTE — Telephone Encounter (Signed)
   Primary Cardiologist: Peter Martinique, MD  Chart reviewed as part of pre-operative protocol coverage. Patient was contacted 07/21/2018 in reference to pre-operative risk assessment for pending surgery as outlined below.  David Cantrell was last seen on 07/21/18 by Fabian Sharp PAC.Marland Kitchen  Since that day, David Cantrell has done well. He recently had a heart catheterization that showed nonobstructive disease, no intervention. He may proceed with colonoscopy without further testing.  Therefore, based on ACC/AHA guidelines, the patient would be at acceptable risk for the planned procedure without further cardiovascular testing.   I will route this recommendation to the requesting party via Epic fax function and remove from pre-op pool.  Please call with questions.  [place], PA 07/21/2018, 5:12 PM

## 2018-07-21 NOTE — Telephone Encounter (Signed)
Called patient, advised that it may have been a reminder call due to appointment for today-  Patient verbalized understanding, had no other questions.

## 2018-07-21 NOTE — Patient Instructions (Addendum)
Medication Instructions:  Your physician recommends that you continue on your current medications as directed. Please refer to the Current Medication list given to you today.  If you need a refill on your cardiac medications before your next appointment, please call your pharmacy.   Lab work: NONE If you have labs (blood work) drawn today and your tests are completely normal, you will receive your results only by: Marland Kitchen MyChart Message (if you have MyChart) OR . A paper copy in the mail If you have any lab test that is abnormal or we need to change your treatment, we will call you to review the results.  Testing/Procedures: NONE  Follow-Up: At [facility], you and your health needs are our priority.  As part of our continuing mission to provide you with exceptional heart care, we have created designated Provider Care Teams.  These Care Teams include your primary Cardiologist (physician) and Advanced Practice Providers (APPs -  Physician Assistants and Nurse Practitioners) who all work together to provide you with the care you need, when you need it. You will need a follow up appointment in 3 months.  Please call our office 2 months in advance to schedule this appointment.  You may see ANGELA DUKE, PA-C.

## 2018-07-21 NOTE — Telephone Encounter (Signed)
    COVID-19 Pre-Screening Questions:  . In the past 7 to 10 days have you had a cough,  shortness of breath, headache, congestion, fever (100 or greater) body aches, chills, sore throat, or sudden loss of taste or sense of smell? NO; SOB BUT THAT'S EVERYDAY D/T HX EMPHYSEMA .  Marland Kitchen Have you been around anyone with known Covid 19? NO .  Marland Kitchen Have you been around anyone who is awaiting Covid 19 test results in the past 7 to 10 days? NO . Have you been around anyone who has been exposed to Covid 19, or has mentioned symptoms of Covid 19 within the past 7 to 10 days? NO  If you have any concerns/questions about symptoms patients report during screening (either on the phone or at threshold). Contact the provider seeing the patient or DOD for further guidance.  If neither are available contact a member of the leadership team.   Spoke with pt who believed that his virtual visit was an office visit. Nurse let patient know that the appt type could be changed since pt was under the impression that he was coming into the office today. Pt is HOH and states that he reads lips because depending on the tone of the speaker's voice he may not be able to pick up on what they are saying. Informed him that his wife may accompany him on the visit d/t this and have discussed changing appt type and allowing pt wife to be present with David Sharp, PA-C. He is aware to come around his appt time but not too early because of limited seating and that both he and his wife must wear a mask. Pt verbalized understanding

## 2018-07-22 ENCOUNTER — Other Ambulatory Visit: Payer: Self-pay

## 2018-07-22 DIAGNOSIS — J449 Chronic obstructive pulmonary disease, unspecified: Secondary | ICD-10-CM

## 2018-07-22 NOTE — Telephone Encounter (Signed)
Faxed over order to given fax number.

## 2018-07-22 NOTE — Telephone Encounter (Signed)
Please advise, sent to [facility] but did not get RX. Will Dr.Jordan okay this order? Thank you!

## 2018-07-22 NOTE — Telephone Encounter (Signed)
Order placed, will fax over.

## 2018-07-22 NOTE — Telephone Encounter (Signed)
FYI

## 2018-07-22 NOTE — Telephone Encounter (Signed)
Yes I am OK placing this order  Mikea Quadros Martinique MD, [facility]

## 2018-07-22 NOTE — Telephone Encounter (Signed)
Follow up    Patients wife is calling because [facility] changed the name to [facility] and the fax number is [phone]. That is where the rx for the Pulse OX should go to.

## 2018-08-22 ENCOUNTER — Other Ambulatory Visit: Payer: Self-pay

## 2018-08-22 ENCOUNTER — Ambulatory Visit: Payer: PPO | Admitting: *Deleted

## 2018-08-22 VITALS — Ht 73.0 in | Wt 222.0 lb

## 2018-08-22 DIAGNOSIS — Z800 Family history of malignant neoplasm of digestive organs: Secondary | ICD-10-CM

## 2018-08-22 DIAGNOSIS — Z86010 Personal history of colonic polyps: Secondary | ICD-10-CM

## 2018-08-22 NOTE — Progress Notes (Signed)
No egg or soy allergy known to patient  No issues with past sedation with any surgeries  or procedures, no intubation problems  No diet pills per patient No home 02 use per patient  No blood thinners per patient  Pt denies issues with constipation  No A fib or A flutter  EMMI video sent to pt's e mail   Pt already has Golytely and Dulcolax 5 mg tabs at home from Previously scheduled procedure   Pt is aware that care partner will wait in the car during procedure; if they feel like they will be too hot to wait in the car; they may wait in the lobby.  We want them to wear a mask (we do not have any that we can provide them), practice social distancing, and we will check their temperatures when they get here.  I did remind patient that their care partner needs to stay in the parking lot the entire time. Pt will wear mask into building.  Pt verified name, DOB, address and insurance during PV today. Pt mailed instruction packet to included paper to complete and mail back to [facility] with addressed and stamped envelope, Emmi video, copy of consent form to read and not return, and instructions. . PV completed over the phone. Pt encouraged to call with questions or issues

## 2018-09-01 ENCOUNTER — Ambulatory Visit (AMBULATORY_SURGERY_CENTER): Payer: PPO | Admitting: Internal Medicine

## 2018-09-01 ENCOUNTER — Encounter: Payer: Self-pay | Admitting: Internal Medicine

## 2018-09-01 ENCOUNTER — Other Ambulatory Visit: Payer: Self-pay

## 2018-09-01 VITALS — BP 125/68 | HR 52 | Temp 98.3°F | Resp 14 | Ht 73.0 in | Wt 222.0 lb

## 2018-09-01 DIAGNOSIS — D128 Benign neoplasm of rectum: Secondary | ICD-10-CM | POA: Diagnosis not present

## 2018-09-01 DIAGNOSIS — Z86010 Personal history of colonic polyps: Secondary | ICD-10-CM | POA: Diagnosis not present

## 2018-09-01 DIAGNOSIS — D122 Benign neoplasm of ascending colon: Secondary | ICD-10-CM

## 2018-09-01 DIAGNOSIS — D123 Benign neoplasm of transverse colon: Secondary | ICD-10-CM

## 2018-09-01 MED ORDER — SODIUM CHLORIDE 0.9 % IV SOLN: 500.0000 mL | Freq: Once | INTRAVENOUS | Status: DC

## 2018-09-01 NOTE — Progress Notes (Signed)
Called to room to assist during endoscopic procedure.  Patient ID and intended procedure confirmed with present staff. Received instructions for my participation in the procedure from the performing physician.  

## 2018-09-01 NOTE — Patient Instructions (Signed)
Handouts given for hemorrhoids, polyps and diverticulosis.  YOU HAD AN ENDOSCOPIC PROCEDURE TODAY AT [facility]:   Refer to the procedure report that was given to you for any specific questions about what was found during the examination.  If the procedure report does not answer your questions, please call your gastroenterologist to clarify.  If you requested that your care partner not be given the details of your procedure findings, then the procedure report has been included in a sealed envelope for you to review at your convenience later.  YOU SHOULD EXPECT: Some feelings of bloating in the abdomen. Passage of more gas than usual.  Walking can help get rid of the air that was put into your GI tract during the procedure and reduce the bloating. If you had a lower endoscopy (such as a colonoscopy or flexible sigmoidoscopy) you may notice spotting of blood in your stool or on the toilet paper. If you underwent a bowel prep for your procedure, you may not have a normal bowel movement for a few days.  Please Note:  You might notice some irritation and congestion in your nose or some drainage.  This is from the oxygen used during your procedure.  There is no need for concern and it should clear up in a day or so.  SYMPTOMS TO REPORT IMMEDIATELY:   Following lower endoscopy (colonoscopy or flexible sigmoidoscopy):  Excessive amounts of blood in the stool  Significant tenderness or worsening of abdominal pains  Swelling of the abdomen that is new, acute  Fever of 100F or higher  For urgent or emergent issues, a gastroenterologist can be reached at any hour by calling [phone].   DIET:  We do recommend a small meal at first, but then you may proceed to your regular diet.  Drink plenty of fluids but you should avoid alcoholic beverages for 24 hours.  ACTIVITY:  You should plan to take it easy for the rest of today and you should NOT DRIVE or use heavy machinery until tomorrow  (because of the sedation medicines used during the test).    FOLLOW UP: Our staff will call the number listed on your records 48-72 hours following your procedure to check on you and address any questions or concerns that you may have regarding the information given to you following your procedure. If we do not reach you, we will leave a message.  We will attempt to reach you two times.  During this call, we will ask if you have developed any symptoms of COVID 19. If you develop any symptoms (ie: fever, flu-like symptoms, shortness of breath, cough etc.) before then, please call [phone].  If you test positive for Covid 19 in the 2 weeks post procedure, please call and report this information to [place].    If any biopsies were taken you will be contacted by phone or by letter within the next 1-3 weeks.  Please call us at [phone] if you have not heard about the biopsies in 3 weeks.    SIGNATURES/CONFIDENTIALITY: You and/or your care partner have signed paperwork which will be entered into your electronic medical record.  These signatures attest to the fact that that the information above on your After Visit Summary has been reviewed and is understood.  Full responsibility of the confidentiality of this discharge information lies with you and/or your care-partner.

## 2018-09-01 NOTE — Progress Notes (Signed)
PT taken to PACU. Monitors in place. VSS. Report given to RN. 

## 2018-09-01 NOTE — Op Note (Addendum)
[facility] Patient Name: David Cantrell Procedure Date: 09/01/2018 1:42 PM MRN: [id number] Endoscopist: Jerene Bears , MD Age: 78 Referring MD:  Date of Birth: 06/22/1940 Gender: Male Account #: [id number] Procedure:                Colonoscopy Indications:              High risk colon cancer surveillance: Personal                            history of multiple (3 or more) adenomas, Last                            colonoscopy: May 2017 Medicines:                Monitored Anesthesia Care Procedure:                Pre-Anesthesia Assessment:                           - Prior to the procedure, a History and Physical                            was performed, and patient medications and                            allergies were reviewed. The patient's tolerance of                            previous anesthesia was also reviewed. The risks                            and benefits of the procedure and the sedation                            options and risks were discussed with the patient.                            All questions were answered, and informed consent                            was obtained. Prior Anticoagulants: The patient has                            taken no previous anticoagulant or antiplatelet                            agents. ASA Grade Assessment: III - A patient with                            severe systemic disease. After reviewing the risks                            and benefits, the patient was deemed in  satisfactory condition to undergo the procedure.                           After obtaining informed consent, the colonoscope                            was passed under direct vision. Throughout the                            procedure, the patient's blood pressure, pulse, and                            oxygen saturations were monitored continuously. The                            Colonoscope was introduced through the anus and                             advanced to the cecum, identified by appendiceal                            orifice and ileocecal valve. The colonoscopy was                            performed without difficulty. The patient tolerated                            the procedure well. The quality of the bowel                            preparation was good. The terminal ileum, ileocecal                            valve, appendiceal orifice, and rectum were                            photographed. Scope In: 1:59:52 PM Scope Out: 2:25:27 PM Scope Withdrawal Time: 0 hours 23 minutes 20 seconds  Total Procedure Duration: 0 hours 25 minutes 35 seconds  Findings:                 The digital rectal exam was normal.                           A 3 mm polyp was found in the ascending colon. The                            polyp was sessile. The polyp was removed with a                            cold snare. Resection and retrieval were complete.                           Five sessile polyps were found in the transverse  colon. The polyps were 3 to 7 mm in size. These                            polyps were removed with a cold snare. Resection                            and retrieval were complete.                           A 5 mm polyp was found in the rectum. The polyp was                            sessile and located in segment involved by                            radiation proctitis. The polyp was removed with a                            hot snare. Resection and retrieval were complete.                           Multiple small and large-mouthed diverticula were                            found in the sigmoid colon and descending colon.                           Mild scattered radiation proctitis was found in the                            very distal rectum. Internal hemorrhoids were found                            during retroflexion. The hemorrhoids were  small. Complications:            No immediate complications. Estimated Blood Loss:     Estimated blood loss was minimal. Impression:               - One 3 mm polyp in the ascending colon, removed                            with a cold snare. Resected and retrieved.                           - Five 3 to 7 mm polyps in the transverse colon,                            removed with a cold snare. Resected and retrieved.                           - One 5 mm polyp in the rectum, removed with a hot  snare. Resected and retrieved.                           - Diverticulosis in the sigmoid colon and in the                            descending colon.                           - Mild radiation proctitis.                           - Small internal hemorrhoids. Recommendation:           - Patient has a contact number available for                            emergencies. The signs and symptoms of potential                            delayed complications were discussed with the                            patient. Return to normal activities tomorrow.                            Written discharge instructions were provided to the                            patient.                           - Resume previous diet.                           - Continue present medications.                           - Await pathology results.                           - Repeat colonoscopy date to be determined after                            pending pathology results are reviewed. Jerene Bears, MD 09/01/2018 2:41:47 PM This report has been signed electronically.

## 2018-09-05 ENCOUNTER — Encounter: Payer: PPO | Admitting: Internal Medicine

## 2018-09-05 ENCOUNTER — Other Ambulatory Visit: Payer: Self-pay | Admitting: Nurse Practitioner

## 2018-09-05 ENCOUNTER — Telehealth: Payer: Self-pay

## 2018-09-05 NOTE — Telephone Encounter (Signed)
  Follow up Call-  Call back number 09/01/2018  Post procedure Call Back phone  # [phone] or cell [phone]  Permission to leave phone message Yes  Some recent data might be hidden     Patient questions:  Do you have a fever, pain , or abdominal swelling? No. Pain Score  0 *  Have you tolerated food without any problems? Yes.    Have you been able to return to your normal activities? Yes.    Do you have any questions about your discharge instructions: Diet   No. Medications  No. Follow up visit  No.  Do you have questions or concerns about your Care? No.  Actions: * If pain score is 4 or above: No action needed, pain <4.  1. Have you developed a fever since your procedure? no  2.   Have you had an respiratory symptoms (SOB or cough) since your procedure? no  3.   Have you tested positive for COVID 19 since your procedure no  4.   Have you had any family members/close contacts diagnosed with the COVID 19 since your procedure?  no   If yes to any of these questions please route to Joylene John, RN and Alphonsa Gin, Therapist, sports.

## 2018-09-08 ENCOUNTER — Encounter: Payer: Self-pay | Admitting: Internal Medicine

## 2018-09-21 ENCOUNTER — Other Ambulatory Visit: Payer: Self-pay | Admitting: Nurse Practitioner

## 2018-09-28 ENCOUNTER — Telehealth: Payer: Self-pay | Admitting: *Deleted

## 2018-09-28 NOTE — Telephone Encounter (Signed)
Patient had requested refill of hyoscyamine SL. Rx was sent and PA request was sent through [facility] ([place]) as requested by pharmacy. Unfortunately, coverage was denied as "benefit exclusion." Would you like me to attempt to send him another antispasmotic? Typically dicyclomine or something of that sort is covered by medicare.

## 2018-09-29 NOTE — Telephone Encounter (Signed)
Could substitute bentyl 10 mg TIDPRN

## 2018-09-29 NOTE — Telephone Encounter (Signed)
I have advised that insurance will not cover hyoscyamine. However, apparently patient was able to pay 19 dollars out of pocket for rx without issue. Therefore, no needs to change to dicyclomine at this current time.

## 2018-10-06 ENCOUNTER — Other Ambulatory Visit: Payer: Self-pay

## 2018-10-06 ENCOUNTER — Ambulatory Visit (INDEPENDENT_AMBULATORY_CARE_PROVIDER_SITE_OTHER): Payer: PPO

## 2018-10-06 DIAGNOSIS — Z23 Encounter for immunization: Secondary | ICD-10-CM

## 2018-10-18 NOTE — Progress Notes (Signed)
Cardiology Office Note:    Date:  10/24/2018   ID:  David Cantrell, DOB 1940-12-19, MRN [id number]  PCP:  [name], MD  Cardiologist:  Peter Martinique, MD   Referring MD: [name], MD   Chief Complaint  Patient presents with  . Follow-up    CAD    History of Present Illness:    David Cantrell is a 78 y.o. male with a history of CAD in 2012 by cath (left PDA) managed medically, HTN, HLD, CKD stage III, COPD, first degree AV block, ascending aortic aneurysm (4.3 cm by CT 6/20, followed by 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 Follow up myoview in 2015 normal. I saw him in clinic 06/24/18 and he complained of symptoms concerning for stable angina. CT chest performed by [name] also showed possible progression of coronary calcification. Decision was made to admit for hydration and obtain angiography. He presented to [facility] on 06/[facility]/18 for hydration and had left heart catheterization on 06/28/18. LHC showed nonobstructive disease: 40% stenosis in the proximal to mid LAD followed by 50% stenosis in the mid LAD, LPDA 65% stenosis, normal left ventricular systolic function and normal LVEDP. He tolerated the procedure well and was discharged same day after 4 hrs of hydration. Lopressor was D/C'ed for bradycardia, as well as cosopt.   He did complain of dizziness upon standing and was noted to be on 0.8 mg flomax. Discussed that this may be the cause of his dizziness. He was also advised to follow up with PCP for COPD regimen as this could also be contributing to his SOB. He does not wish to return to his pulmonologist. I saw him in clinic 07/21/18 for follow up. He continued to c/o SOB, likely secondary to COPD. I referred him back to pulmonology. He planned to increase his walking program.  He returns today for 3 month follow up. He is here with this wife. He is doing very well. He never followed up with pulmonology. He still struggles with DOE. He denies  chest pain, orthopnea, and lower extremity swelling. I advised to avoid church services that are indoor given his risk factors.    Past Medical History:  Diagnosis Date  . Allergy   . Aortic ectasia (HCC)    a. 2.5cm aortic ectasia by CT 05/2017 (follow-up due 05/2022).  Marland Kitchen Arthritis   . Bronchiectasis ([place])    per pulmologist note -- dr Lake Bells-- related to multiple episodes pneumonia as adult  . CAD (coronary artery disease)    a. L PDA obstructive disease 2012, managed medically.  . Cancer ([place])    basal cell ca  . Cataract   . Centrilobular emphysema ([place])   . Congenital renal cyst   . COPD (chronic obstructive pulmonary disease) (HCC)    pulmologist-  dr Lake Bells  . Diverticulosis of colon   . Emphysema of lung ([place])   . First degree heart block   . Hemorrhoids   . History of adenomatous polyp of colon    hx multiple tubular adenoma's and hyperplastic polyp's since 2001--- last colonoscopy 05-29-2015  . History of basal cell carcinoma excision    10-31-2012  nose  . History of external beam radiation therapy 02-01-2016  to 03-16-2016   prostate 45Gy  plus radioactive prostate seed implants 04-07-2016  . History of kidney stones   . Hyperlipidemia   . Hypertension   . 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  Multiple pulmonary nodules    S/P  BILATERAL BRONCHOSCOPY  09-24-2014  no infection --    . Open-angle glaucoma    RIGHT EYE ONLY/OPEN ANGLE  . Prostate cancer [facility]) urologist-  dr eskridge/  oncologist-  dr Tammi Klippel   Stage T1c,  Gleason3+4, PSA 4.7--  s/p  external beam radiation therapy 02-01-2016 to 03-16-2016  . Seasonal allergies   . Thoracic aortic aneurysm ([place])    a. [name] following -4.3cm by CT 06/2018.  Marland Kitchen Wears dentures    full upper and lower parital    Past Surgical History:  Procedure Laterality Date  . CARDIAC CATHETERIZATION  09-04-2002  DR Wynonia Lawman   NORMAL LVF/  MILD TO MODERATE CAD INVOLVING PROXIMAL 40%  AND MID 40%  LAD/  POSTERIOR  DESCENDING CX 70%  . CARDIAC CATHETERIZATION  06-05-2010  DR Fidela Juneau   NORMAL LM/  40% PROXIMAL MID LAD/ 70% DISTAL CFX/  80% PROXIMAL PDA/  SMALL & NONDOMINANT RCA  . CARDIOVASCULAR STRESS TEST  06-01-2013   dr Wynonia Lawman   normal lexiscan myoview w/ no ischemia/ normal LV function and wall motion, ef 61%  . COLONOSCOPY    . COLONOSCOPY W/ POLYPECTOMY  last one 05-29-2015  . CYSTOSCOPY N/A 04/07/2016   Procedure: CYSTOSCOPY FLEXIBLE;  Surgeon: Festus Aloe, MD;  Location: [facility];  Service: Urology;  Laterality: N/A;  no seeds found in bladder  . CYSTOSCOPY WITH RETROGRADE PYELOGRAM, URETEROSCOPY AND STENT PLACEMENT Right 02/02/2013   Procedure: CYSTOSCOPY WITH RETROGRADE PYELOGRAM, AND Right STENT PLACEMENT;  Surgeon: Molli Hazard, MD;  Location: [facility];  Service: Urology;  Laterality: Right;  . CYSTOSCOPY WITH RETROGRADE PYELOGRAM, URETEROSCOPY AND STENT PLACEMENT Right 02/15/2013   Procedure: CYSTOSCOPY WITH RETROGRADE PYELOGRAM, URETEROSCOPY AND STENT EXCHANGE;  Surgeon: Molli Hazard, MD;  [facility];  Service: Urology;  Laterality: Right;  . ESI     ; for cervical radiculopathy  . HEMORRHOID SURGERY  07-03-1999  . HOLMIUM LASER APPLICATION Right [id number]   Procedure: HOLMIUM LASER APPLICATION;  Surgeon: Molli Hazard, MD;  Location: [facility];  Service: Urology;  Laterality: Right;  . LEFT HEART CATH AND CORONARY ANGIOGRAPHY N/A 06/28/2018   Procedure: LEFT HEART CATH AND CORONARY ANGIOGRAPHY;  Surgeon: Martinique, Peter M, MD;  Location: [place] [facility];  Service: Cardiovascular;  Laterality: N/A;  . POLYPECTOMY    . PROSTATE BIOPSY    . RADIOACTIVE SEED IMPLANT N/A 04/07/2016   Procedure: RADIOACTIVE SEED IMPLANT/BRACHYTHERAPY IMPLANT;  Surgeon: Festus Aloe, MD;  Location: [facility];  Service: Urology;  Laterality: N/A;  55 seeds implanted  . SHOULDER OPEN ROTATOR CUFF REPAIR Bilateral  RIGHT X2  LAST ONE 1997/  LEFT 2005   right inferior glenoid screw retained  . VIDEO BRONCHOSCOPY Bilateral 09/24/2014   Procedure: VIDEO BRONCHOSCOPY WITH FLUORO;  Surgeon: Juanito Doom, MD;  Location: [place];  Service: Cardiopulmonary;  Laterality: Bilateral;    Current Medications: Current Meds  Medication Sig  . acetaminophen (TYLENOL) 500 MG tablet Take 1,000 mg by mouth every 6 (six) hours as needed for mild pain.   Marland Kitchen aspirin EC 81 MG tablet Take 81 mg by mouth at bedtime.   . brimonidine (ALPHAGAN) 0.15 % ophthalmic solution Place 1 drop into the right eye 2 (two) times daily.   . Cholecalciferol (VITAMIN D) 50 MCG (2000 UT) CAPS Take 2,000 Units by mouth 2 (two) times daily after a meal.  . cyclobenzaprine (FLEXERIL)  5 MG tablet Take 5 mg by mouth daily as needed for muscle spasms.  . dorzolamide (TRUSOPT) 2 % ophthalmic solution 1 drop 3 (three) times daily.  . fenofibrate 160 MG tablet Take 1 tablet (160 mg total) by mouth daily.  . fexofenadine (ALLEGRA) 180 MG tablet Take 180 mg by mouth daily as needed for allergies or rhinitis.  . fluticasone furoate-vilanterol (BREO ELLIPTA) 200-25 MCG/INH AEPB Inhale 1 puff into the lungs daily.  Marland Kitchen GARLIC PO Take 1 capsule by mouth daily.  . hydrocortisone ointment 0.5 % Apply 1 application topically 2 (two) times daily as needed for itching.   . hyoscyamine (LEVSIN SL) 0.125 MG SL tablet PLACE 1 TABLET UNDER THE TONGUE AS NEEDED  . Multiple Vitamin (MULTIVITAMIN WITH MINERALS) TABS tablet Take 1 tablet by mouth 2 (two) times a day.  . Multiple Vitamins-Minerals (ICAPS AREDS 2 PO) Take by mouth 2 (two) times daily after a meal.  . nitroGLYCERIN (NITROSTAT) 0.4 MG SL tablet Place 0.4 mg under the tongue every 5 (five) minutes as needed for chest pain.  . Omega-3 Fatty Acids (FISH OIL) 1200 MG CAPS Take 1 capsule by mouth 2 (two) times daily.   Marland Kitchen PROAIR HFA 108 (90 Base) MCG/ACT inhaler Inhale 1-2 puffs into the lungs every 6 (six)  hours as needed for wheezing or shortness of breath.  . Probiotic Product (ALIGN PO) Take 1 tablet by mouth daily.   . psyllium (METAMUCIL) 58.6 % packet Take 1 packet by mouth daily.   . rosuvastatin (CRESTOR) 10 MG tablet Take 1 tablet (10 mg total) by mouth 2 (two) times a week. Monday and thursday (Patient taking differently: Take 10 mg by mouth 2 (two) times a week. Monday and Thursday nights)  . shark liver oil-cocoa butter (PREPARATION H) 0.25-88.44 % suppository Place 1 suppository rectally daily as needed for hemorrhoids.   . silodosin (RAPAFLO) 4 MG CAPS capsule Take 4 mg by mouth at bedtime.  . tamsulosin (FLOMAX) 0.4 MG CAPS capsule Take 2 capsules (0.8 mg total) by mouth at bedtime.     Allergies:   Doxycycline, Lipitor [atorvastatin], Morphine and related, Niacin and related, Ramipril, Sulfa antibiotics, and Codeine   Social History   Socioeconomic History  . Marital status: Married    Spouse name: Not on file  . Number of children: 3  . Years of education: Not on file  . Highest education level: Not on file  Occupational History  . Not on file  Social Needs  . Financial resource strain: Not on file  . Food insecurity    Worry: Not on file    Inability: Not on file  . Transportation needs    Medical: Not on file    Non-medical: Not on file  Tobacco Use  . Smoking status: Former Smoker    Packs/day: 2.00    Years: 3.00    Pack years: 6.00    Types: Cigarettes    Quit date: 01/12/1970    Years since quitting: 48.8  . Smokeless tobacco: Never Used  Substance and Sexual Activity  . Alcohol use: No    Alcohol/week: 0.0 standard drinks  . Drug use: No  . Sexual activity: Not on file  Lifestyle  . Physical activity    Days per week: Not on file    Minutes per session: Not on file  . Stress: Not on file  Relationships  . Social Herbalist on phone: Not on file    Gets together:  Not on file    Attends religious service: Not on file    Active member of  club or organization: Not on file    Attends meetings of clubs or organizations: Not on file    Relationship status: Not on file  Other Topics Concern  . Not on file  Social History Narrative  . Not on file     Family History: The patient's family history includes Cancer in his brother, cousin, father, and another family member; Colon cancer in his father; Colon polyps in his brother, brother, and sister; Diabetes in his paternal uncle; Heart attack (age of onset: 40) in his brother; Stroke in his father. There is no history of Arthritis, Asthma, COPD, Rectal cancer, Stomach cancer, or Esophageal cancer.  ROS:   Please see the history of present illness.     All other systems reviewed and are negative.  EKGs/Labs/Other Studies Reviewed:    The following studies were reviewed today:  Cath 06/28/18 Conclusion    Prox LAD to Mid LAD lesion is 40% stenosed.  Mid LAD lesion is 50% stenosed.  LPDA lesion is 65% stenosed.  The left ventricular systolic function is normal.  LV end diastolic pressure is normal.  The left ventricular ejection fraction is 55-65% by visual estimate.  1. Nonobstructive CAD 2. Normal LV function 3. Normal LVEDP  Plan: compared to prior cardiac cath in 2012 there is no significant change. Continue medical therapy. Consider other causes of dyspnea. Anticipate DC later today after 4 hours hydration     EKG:  EKG is not ordered today.   Recent Labs: 06/28/2018: ALT 15; Hemoglobin 11.7; Platelets 217 07/04/2018: BUN 13; Creatinine, Ser 1.34; Potassium 4.1; Sodium 140  Recent Lipid Panel    Component Value Date/Time   CHOL 96 06/28/2018 0603   CHOL 135 01/17/2018 0906   TRIG 87 06/28/2018 0603   TRIG 116 01/15/2006 0828   HDL 23 (L) 06/28/2018 0603   HDL 35 (L) 01/17/2018 0906   CHOLHDL 4.2 06/28/2018 0603   VLDL 17 06/28/2018 0603   LDLCALC 56 06/28/2018 0603   LDLCALC 74 01/17/2018 0906   LDLDIRECT 74.7 02/21/2009 0932    Physical  Exam:    VS:  BP 115/80   Pulse 69   Temp 97.9 F (36.6 C)   Ht 6\' 1"  (1.854 m)   Wt 219 lb 6.4 oz (99.5 kg)   SpO2 98%   BMI 28.95 kg/m     Wt Readings from Last 3 Encounters:  10/24/18 219 lb 6.4 oz (99.5 kg)  09/01/18 222 lb (100.7 kg)  08/22/18 222 lb (100.7 kg)     GEN:  Well nourished, well developed in no acute distress HEENT: Normal NECK: No JVD; No carotid bruits LYMPHATICS: No lymphadenopathy CARDIAC: RRR, no murmurs, rubs, gallops RESPIRATORY:  Clear to auscultation without rales, wheezing or rhonchi  ABDOMEN: Soft, non-tender, non-distended MUSCULOSKELETAL:  No edema; No deformity  SKIN: Warm and dry NEUROLOGIC:  Alert and oriented x 3 PSYCHIATRIC:  Normal affect   ASSESSMENT:    1. Coronary artery disease involving native coronary artery of native heart without angina pectoris   2. Essential hypertension   3. Stage 3a chronic kidney disease   4. Chronic obstructive pulmonary disease, unspecified COPD type ([place])   5. Pure hypercholesterolemia    PLAN:    In order of problems listed above:  Nonobstructive CAD, LHC 06/2018 essentially unchanged from 2012 cath - lopressor and cosopt stopped for bradycardia and dizziness upon  standing - continue ASA   Hyperlipidemia 06/28/2018: Cholesterol 96; HDL 23; LDL Cholesterol 56; Triglycerides 87; VLDL 17 - continue fenofibrate, fish oil, and crestor   Hypertension  Pressures are well-controlled.   CKD stage III - sCr 1.34 post cath, stable and at baseline   COPD - agreed to try daily inhaler with PCP - agreed to start a walking program for sedentary lifestyle - he has not followed up with pulmonology   Follow up with Dr. Martinique in 6 months.     Medication Adjustments/Labs and Tests Ordered: Current medicines are reviewed at length with the patient today.  Concerns regarding medicines are outlined above.  No orders of the defined types were placed in this encounter.  No orders of the defined  types were placed in this encounter.   Signed, Tami Lin Brailyn Delman, PA  10/24/2018 2:20 PM    [facility]

## 2018-10-24 ENCOUNTER — Encounter: Payer: Self-pay | Admitting: Physician Assistant

## 2018-10-24 ENCOUNTER — Ambulatory Visit (INDEPENDENT_AMBULATORY_CARE_PROVIDER_SITE_OTHER): Payer: PPO | Admitting: Physician Assistant

## 2018-10-24 ENCOUNTER — Other Ambulatory Visit: Payer: Self-pay

## 2018-10-24 VITALS — BP 115/80 | HR 69 | Temp 97.9°F | Ht 73.0 in | Wt 219.4 lb

## 2018-10-24 DIAGNOSIS — E7800 Pure hypercholesterolemia, unspecified: Secondary | ICD-10-CM | POA: Diagnosis not present

## 2018-10-24 DIAGNOSIS — L720 Epidermal cyst: Secondary | ICD-10-CM | POA: Diagnosis not present

## 2018-10-24 DIAGNOSIS — D485 Neoplasm of uncertain behavior of skin: Secondary | ICD-10-CM | POA: Diagnosis not present

## 2018-10-24 DIAGNOSIS — I10 Essential (primary) hypertension: Secondary | ICD-10-CM | POA: Diagnosis not present

## 2018-10-24 DIAGNOSIS — N1831 Chronic kidney disease, stage 3a: Secondary | ICD-10-CM | POA: Diagnosis not present

## 2018-10-24 DIAGNOSIS — L578 Other skin changes due to chronic exposure to nonionizing radiation: Secondary | ICD-10-CM | POA: Diagnosis not present

## 2018-10-24 DIAGNOSIS — J449 Chronic obstructive pulmonary disease, unspecified: Secondary | ICD-10-CM

## 2018-10-24 DIAGNOSIS — I2510 Atherosclerotic heart disease of native coronary artery without angina pectoris: Secondary | ICD-10-CM | POA: Diagnosis not present

## 2018-10-24 DIAGNOSIS — L570 Actinic keratosis: Secondary | ICD-10-CM | POA: Diagnosis not present

## 2018-10-24 NOTE — Patient Instructions (Signed)
Medication Instructions:  Your physician recommends that you continue on your current medications as directed. Please refer to the Current Medication list given to you today.  If you need a refill on your cardiac medications before your next appointment, please call your pharmacy.    Follow-Up: At [facility], you and your health needs are our priority.  As part of our continuing mission to provide you with exceptional heart care, we have created designated Provider Care Teams.  These Care Teams include your primary Cardiologist (physician) and Advanced Practice Providers (APPs -  Physician Assistants and Nurse Practitioners) who all work together to provide you with the care you need, when you need it. You will need a follow up appointment in 6 months.  Please call our office 2 months in advance to schedule this appointment.  You may see Peter Martinique, MD or one of the following Advanced Practice Providers on your designated Care Team:   Kerin Ransom, PA-C Roby Lofts, [place] . Sande Rives, PA-C

## 2018-10-25 DIAGNOSIS — L820 Inflamed seborrheic keratosis: Secondary | ICD-10-CM | POA: Diagnosis not present

## 2018-11-09 ENCOUNTER — Telehealth: Payer: Self-pay | Admitting: Cardiology

## 2018-11-09 NOTE — Telephone Encounter (Signed)
Wife mentioned later that if the patient was in fact diagnoesd with Chronic Heart Failure, the patient would qualify for a program with the insurance company for other types of assistance

## 2018-11-09 NOTE — Telephone Encounter (Signed)
Spoke with patient's wife about patient's diagnoses. Explained that heart cath was scheduled after it was noted that he had progression of coronary calcifications by CT and symptoms of fatigue, SOB, chest tightness. Explained that LVEF was normal per cath report and there is no mention of acute or chronic heart failure. Explained he may can ask insurance company if there are programs for other chronic conditions such as CKD or COPD. She was appreciative of call. No further assistance needed

## 2018-11-09 NOTE — Telephone Encounter (Signed)
Wife of patient called. She has been talking to [employer], the [employer]. [employer] company would like to know a formal diagnosis from Dr. Martinique which led to the patient to have a heart cath done. She would like to speak with Dr. Doug Sou nurse as soon as possible.

## 2018-11-22 ENCOUNTER — Telehealth: Payer: Self-pay | Admitting: Cardiology

## 2018-11-22 DIAGNOSIS — H401131 Primary open-angle glaucoma, bilateral, mild stage: Secondary | ICD-10-CM | POA: Diagnosis not present

## 2018-11-22 NOTE — Telephone Encounter (Signed)
Pt c/o medication issue:  1. Name of Medication: silodosin (RAPAFLO) 4 MG CAPS capsule  2. How are you currently taking this medication (dosage and times per day)? As directed  3. Are you having a reaction (difficulty breathing--STAT)? no  4. What is your medication issue? Patient is in the process of switching medicines to something more affordable. This medicine will cost the patient $90 for a 30 day supply next year. The provider who prescribes this medicine wants to make sure David Cantrell is OK with changing the medications.  The new medications could be : Dutasteride 0.5 mg  Finasteride  5 mg alfuzosin 10mg   Please let the wife know what David Cantrell thinks will be best   The wife of the patient also reports the patient is no longer taking tamsulosin 0.4 mg

## 2018-11-22 NOTE — Telephone Encounter (Signed)
Will route to MD to make aware. 

## 2018-11-23 ENCOUNTER — Other Ambulatory Visit: Payer: Self-pay | Admitting: Internal Medicine

## 2018-11-23 MED ORDER — BREO ELLIPTA 200-25 MCG/INH IN AEPB: 1.0000 | INHALATION_SPRAY | Freq: Every day | RESPIRATORY_TRACT | 5 refills | Status: AC

## 2018-11-23 NOTE — Telephone Encounter (Signed)
Patient requesting 90 day supply fluticasone furoate-vilanterol (BREO ELLIPTA) 200-25 MCG/INH AEPB , informed please inform 48 to 72 hour turn around   [place] ([place]), [place] - [address]

## 2018-11-23 NOTE — Telephone Encounter (Signed)
I am OK with these changes.  Peter Martinique MD, [facility]

## 2018-11-23 NOTE — Telephone Encounter (Signed)
Spoke to patient Dr.Jordan agrees with medication changes.

## 2018-12-29 ENCOUNTER — Ambulatory Visit: Payer: PPO | Admitting: Internal Medicine

## 2019-01-19 DIAGNOSIS — H401131 Primary open-angle glaucoma, bilateral, mild stage: Secondary | ICD-10-CM | POA: Diagnosis not present

## 2019-01-24 DIAGNOSIS — Z85828 Personal history of other malignant neoplasm of skin: Secondary | ICD-10-CM | POA: Diagnosis not present

## 2019-01-24 DIAGNOSIS — L819 Disorder of pigmentation, unspecified: Secondary | ICD-10-CM | POA: Diagnosis not present

## 2019-01-24 DIAGNOSIS — D485 Neoplasm of uncertain behavior of skin: Secondary | ICD-10-CM | POA: Diagnosis not present

## 2019-01-24 DIAGNOSIS — D1801 Hemangioma of skin and subcutaneous tissue: Secondary | ICD-10-CM | POA: Diagnosis not present

## 2019-01-24 DIAGNOSIS — D229 Melanocytic nevi, unspecified: Secondary | ICD-10-CM | POA: Diagnosis not present

## 2019-01-24 DIAGNOSIS — B079 Viral wart, unspecified: Secondary | ICD-10-CM | POA: Diagnosis not present

## 2019-01-24 DIAGNOSIS — L905 Scar conditions and fibrosis of skin: Secondary | ICD-10-CM | POA: Diagnosis not present

## 2019-01-24 DIAGNOSIS — L718 Other rosacea: Secondary | ICD-10-CM | POA: Diagnosis not present

## 2019-01-24 DIAGNOSIS — L570 Actinic keratosis: Secondary | ICD-10-CM | POA: Diagnosis not present

## 2019-01-24 DIAGNOSIS — L814 Other melanin hyperpigmentation: Secondary | ICD-10-CM | POA: Diagnosis not present

## 2019-01-24 DIAGNOSIS — L821 Other seborrheic keratosis: Secondary | ICD-10-CM | POA: Diagnosis not present

## 2019-01-30 DIAGNOSIS — L820 Inflamed seborrheic keratosis: Secondary | ICD-10-CM | POA: Diagnosis not present

## 2019-01-30 DIAGNOSIS — D485 Neoplasm of uncertain behavior of skin: Secondary | ICD-10-CM | POA: Diagnosis not present

## 2019-01-31 ENCOUNTER — Ambulatory Visit: Payer: Self-pay | Admitting: *Deleted

## 2019-01-31 NOTE — Telephone Encounter (Signed)
Message from [place] sent at 01/31/2019 5:00 PM EST  Pt was exposed to preacher on Sunday ( they shook hands) who was diagnosed with covid on Monday. Pt does not have any symptoms but wants to know if he needs to be in quarantine. cb is [phone]    Patient states that he and his wife went to church service with a preacher on Sunday who tested positive for COVID on Monday, 01/30/19. Pt states that he was notified on today. Pt states that the preacher was at the back of the church and he passed by him when shaking hands. Pt does not have symptoms at this time. Pt advised to quarantine for 14 days from date of exposure. Pt scheduled for COVID testing on 02/03/19. Pt given home care advice and advised to return call if symptoms develop. Pt also would like PCP's recommendation on receiving the COVID vaccine.    Reason for Disposition . [1] CLOSE CONTACT COVID-19 EXPOSURE within last 14 days AND [2] NO symptoms  Answer Assessment - Initial Assessment Questions 1. COVID-19 CLOSE CONTACT: "Who is the person with the confirmed or suspected COVID-19 infection that you were exposed to?"     Preacher at church on Sunday 2. PLACE of CONTACT: "Where were you when you were exposed to COVID-19?" (e.g., home, school, medical waiting room; which city?)     At church 3. TYPE of CONTACT: "How much contact was there?" (e.g., sitting next to, live in same house, work in same office, same building)     Exposed on Sunday and stood at the back of the church and shook his hand at the back of the church 4. DURATION of CONTACT: "How long were you in contact with the COVID-19 patient?" (e.g., a few seconds, passed by person, a few minutes, 15 minutes or longer, live with the patient)     Shook his hand and gave his prayer card 5. MASK: "Were you wearing a mask?" "Was the other person wearing a mask?" Note: wearing a mask reduces the risk of an otherwise close contact.     yes 6. DATE of CONTACT: "When did you  have contact with a COVID-19 patient?" (e.g., how many days ago) 01/29/19  7. COMMUNITY SPREAD: "Are there lots of cases of COVID-19 (community spread) where you live?" (See public health department website, if unsure)       yes 8. SYMPTOMS: "Do you have any symptoms?" (e.g., fever, cough, breathing difficulty, loss of taste or smell)     No  9. PREGNANCY OR POSTPARTUM: "Is there any chance you are pregnant?" "When was your last menstrual period?" "Did you deliver in the last 2 weeks?"     n/a 10. HIGH RISK: "Do you have any heart or lung problems? Do you have a weak immune system?" (e.g., heart failure, COPD, asthma, HIV positive, chemotherapy, renal failure, diabetes mellitus, sickle cell anemia, obesity)       Emphysema  11. TRAVEL: "Have you traveled out of the country recently?" If so, "When and where?" Also ask about out-of-state travel, since the CDC has identified some high-risk cities for community spread in the [place] Note: Travel becomes less relevant if there is widespread community transmission where the patient lives.       n/a  Protocols used: CORONAVIRUS (COVID-19) EXPOSURE-A-AH

## 2019-02-01 NOTE — Telephone Encounter (Signed)
Pt aware of response.  

## 2019-02-01 NOTE — Telephone Encounter (Signed)
He should get the covid vaccine but does need to wait until he completes his quarantine before he can get it

## 2019-02-03 ENCOUNTER — Ambulatory Visit: Payer: PPO | Attending: Internal Medicine

## 2019-02-03 DIAGNOSIS — Z20822 Contact with and (suspected) exposure to covid-19: Secondary | ICD-10-CM

## 2019-02-04 LAB — NOVEL CORONAVIRUS, NAA: SARS-CoV-2, NAA: NOT DETECTED

## 2019-02-07 NOTE — Progress Notes (Signed)
Cardiology Office Note:    Date:  02/16/2019   ID:  David Cantrell, DOB 1940-03-09, MRN [id number]  PCP:  [name], MD  Cardiologist:  Deshawn Skelley Martinique, MD   Referring MD: [name], MD   Chief Complaint  Patient presents with   Coronary Artery Disease    History of Present Illness:    David Cantrell is a 79 y.o. male with a history of CAD in 2012 by cath (left PDA) managed medically, HTN, HLD, CKD stage III, COPD, first degree AV block, ascending aortic aneurysm (4.3 cm by CT 6/20, followed by 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 Follow up myoview in 2015 normal. I saw him in clinic 06/24/18 and he complained of symptoms concerning for stable angina. CT chest performed by [name] also showed possible progression of coronary calcification. Decision was made to admit for hydration and obtain angiography. He presented to [facility] on 06/27/18 for hydration and had left heart catheterization on 06/28/18. LHC showed nonobstructive disease: 40% stenosis in the proximal to mid LAD followed by 50% stenosis in the mid LAD, LPDA 65% stenosis, normal left ventricular systolic function and normal LVEDP.  Lopressor was D/C'ed for bradycardia, as well as cosopt.     He did complain of dizziness upon standing and was noted to be on 0.8 mg flomax. Discussed that this may be the cause of his dizziness. He was also advised to follow up with PCP for COPD regimen as this could also be contributing to his SOB. He does not wish to return to his pulmonologist. I saw him in clinic 07/21/18 for follow up. He continued to c/o SOB, likely secondary to COPD. Last seen by pulmonary in 2016.   He returns today for  follow up. He is here with this wife. He is doing very well from a cardiac standpoint without chest pain. 3 days ago he developed a pinched nerve in his right neck resulting in severe right arm pain. Has seen ortho and is on steroids and narcotics. Plan injection next  week.  He not followed up with pulmonology. He still has DOE and gets real SOB bending over. He denies chest pain, orthopnea, and lower extremity swelling.   Past Medical History:  Diagnosis Date   Allergy    Aortic ectasia ([place])    a. 2.5cm aortic ectasia by CT 05/2017 (follow-up due 05/2022).   Arthritis    Bronchiectasis ([place])    per pulmologist note -- dr Lake Bells-- related to multiple episodes pneumonia as adult   CAD (coronary artery disease)    a. L PDA obstructive disease 2012, managed medically.   Cancer ([place])    basal cell ca   Cataract    Centrilobular emphysema ([place])    Congenital renal cyst    COPD (chronic obstructive pulmonary disease) ([place])    pulmologist-  dr Lake Bells   Diverticulosis of colon    Emphysema of lung ([place])    First degree heart block    Hemorrhoids    History of adenomatous polyp of colon    hx multiple tubular adenoma's and hyperplastic polyp's since 2001--- last colonoscopy 05-29-2015   History of basal cell carcinoma excision    10-31-2012  nose   History of external beam radiation therapy 02-01-2016  to 03-16-2016   prostate 45Gy  plus radioactive prostate seed implants 04-07-2016   History of kidney stones    Hyperlipidemia    Hypertension    Macular  degeneration    not sure which eye    Multiple pulmonary nodules    S/P  BILATERAL BRONCHOSCOPY  09-24-2014  no infection --     Open-angle glaucoma    RIGHT EYE ONLY/OPEN ANGLE   Prostate cancer [facility]) urologist-  dr eskridge/  oncologist-  dr Tammi Klippel   Stage T1c,  Gleason3+4, PSA 4.7--  s/p  external beam radiation therapy 02-01-2016 to 03-16-2016   Seasonal allergies    Thoracic aortic aneurysm ([place])    a. [name] following -4.3cm by CT 06/2018.   Wears dentures    full upper and lower parital    Past Surgical History:  Procedure Laterality Date   CARDIAC CATHETERIZATION  09-04-2002  DR Wynonia Lawman   NORMAL LVF/  MILD TO MODERATE CAD INVOLVING PROXIMAL 40%  AND  MID 40%  LAD/  POSTERIOR DESCENDING CX 70%   CARDIAC CATHETERIZATION  06-05-2010  DR TILEY   NORMAL LM/  40% PROXIMAL MID LAD/ 70% DISTAL CFX/  80% PROXIMAL PDA/  SMALL & NONDOMINANT RCA   CARDIOVASCULAR STRESS TEST  06-01-2013   dr Wynonia Lawman   normal lexiscan myoview w/ no ischemia/ normal LV function and wall motion, ef 61%   COLONOSCOPY     COLONOSCOPY W/ POLYPECTOMY  last one 05-29-2015   CYSTOSCOPY N/A 04/07/2016   Procedure: CYSTOSCOPY FLEXIBLE;  Surgeon: Festus Aloe, MD;  [facility];  Service: Urology;  Laterality: N/A;  no seeds found in bladder   CYSTOSCOPY WITH RETROGRADE PYELOGRAM, URETEROSCOPY AND STENT PLACEMENT Right 02/02/2013   Procedure: CYSTOSCOPY WITH RETROGRADE PYELOGRAM, AND Right STENT PLACEMENT;  Surgeon: Molli Hazard, MD;  Location: [facility];  Service: Urology;  Laterality: Right;   CYSTOSCOPY WITH RETROGRADE PYELOGRAM, URETEROSCOPY AND STENT PLACEMENT Right 02/15/2013   Procedure: CYSTOSCOPY WITH RETROGRADE PYELOGRAM, URETEROSCOPY AND STENT EXCHANGE;  Surgeon: Molli Hazard, MD;  Location: [facility];  Service: Urology;  Laterality: Right;   ESI     ; for cervical radiculopathy   HEMORRHOID SURGERY  07-03-1999   HOLMIUM LASER APPLICATION Right [id number]   Procedure: HOLMIUM LASER APPLICATION;  Surgeon: Molli Hazard, MD;  Location: [facility];  Service: Urology;  Laterality: Right;   LEFT HEART CATH AND CORONARY ANGIOGRAPHY N/A 06/28/2018   Procedure: LEFT HEART CATH AND CORONARY ANGIOGRAPHY;  Surgeon: Cantrell, Finnian Husted M, MD;  Location: [facility];  Service: Cardiovascular;  Laterality: N/A;   POLYPECTOMY     PROSTATE BIOPSY     RADIOACTIVE SEED IMPLANT N/A 04/07/2016   Procedure: RADIOACTIVE SEED IMPLANT/BRACHYTHERAPY IMPLANT;  Surgeon: Festus Aloe, MD;  Location: [facility];  Service: Urology;  Laterality: N/A;  55 seeds implanted   SHOULDER OPEN  ROTATOR CUFF REPAIR Bilateral RIGHT X2  LAST ONE 1997/  LEFT 2005   right inferior glenoid screw retained   VIDEO BRONCHOSCOPY Bilateral 09/24/2014   Procedure: VIDEO BRONCHOSCOPY WITH FLUORO;  Surgeon: Juanito Doom, MD;  Location: [place];  Service: Cardiopulmonary;  Laterality: Bilateral;    Current Medications: Current Meds  Medication Sig   acetaminophen (TYLENOL) 500 MG tablet Take 1,000 mg by mouth every 6 (six) hours as needed for mild pain.    albuterol (PROAIR HFA) 108 (90 Base) MCG/ACT inhaler ProAir HFA 90 mcg/actuation aerosol inhaler   alfuzosin (UROXATRAL) 10 MG 24 hr tablet Take 10 mg by mouth daily.   alfuzosin (UROXATRAL) 10 MG 24 hr tablet alfuzosin ER 10 mg tablet,extended release 24 hr  aspirin EC 81 MG tablet Take 81 mg by mouth at bedtime.    bimatoprost (LUMIGAN) 0.01 % SOLN Lumigan 0.01 % eye drops   brimonidine (ALPHAGAN) 0.15 % ophthalmic solution Place 1 drop into the right eye 2 (two) times daily.    Cholecalciferol (VITAMIN D) 50 MCG (2000 UT) CAPS Take 2,000 Units by mouth 2 (two) times daily after a meal.   cyclobenzaprine (FLEXERIL) 5 MG tablet Take 5 mg by mouth daily as needed for muscle spasms.   dorzolamide (TRUSOPT) 2 % ophthalmic solution 1 drop 3 (three) times daily.   dorzolamide-timolol (COSOPT) 22.3-6.8 MG/ML ophthalmic solution dorzolamide 22.3 mg-timolol 6.8 mg/mL eye drops  INSTILL 1 DROP INTO EACH EYE TWICE DAILY AS DIRECTED   fenofibrate 160 MG tablet Take 1 tablet (160 mg total) by mouth daily.   fexofenadine (ALLEGRA) 180 MG tablet Take 180 mg by mouth daily as needed for allergies or rhinitis.   fluticasone furoate-vilanterol (BREO ELLIPTA) 200-25 MCG/INH AEPB Inhale 1 puff into the lungs daily.   GARLIC PO Take 1 capsule by mouth daily.   HYDROcodone-acetaminophen (NORCO) 10-325 MG tablet    hydrocortisone ointment 0.5 % Apply 1 application topically 2 (two) times daily as needed for itching.    hyoscyamine  (LEVSIN SL) 0.125 MG SL tablet PLACE 1 TABLET UNDER THE TONGUE AS NEEDED   Influenza vac split quadrivalent PF (FLUZONE HIGH-DOSE) 0.5 ML injection Fluzone High-Dose 2019-20 (PF) 180 mcg/0.5 mL intramuscular syringe   Multiple Vitamin (MULTIVITAMIN WITH MINERALS) TABS tablet Take 1 tablet by mouth 2 (two) times a day.   Multiple Vitamins-Minerals (ICAPS AREDS 2 PO) Take by mouth 2 (two) times daily after a meal.   nitroGLYCERIN (NITROSTAT) 0.4 MG SL tablet Place 1 tablet (0.4 mg total) under the tongue every 5 (five) minutes as needed for chest pain.   Omega-3 Fatty Acids (FISH OIL) 1200 MG CAPS Take 1 capsule by mouth 2 (two) times daily.    polyethylene glycol-electrolytes (NULYTELY) 420 g solution peg-electrolyte solution 420 gram oral solution  TAKE AS DIRECTED   predniSONE (DELTASONE) 10 MG tablet    predniSONE (DELTASONE) 10 MG tablet prednisone 10 mg tablet  Take 6 tablets po x1 day, 5 tablets po x 1 day, 4 tablets po x 1 day, 3 tablets po x 1 day, 2 tablets po x 1 day, 1 tablet po x 1 day   PROAIR HFA 108 (90 Base) MCG/ACT inhaler Inhale 1-2 puffs into the lungs every 6 (six) hours as needed for wheezing or shortness of breath.   Probiotic Product (ALIGN PO) Take 1 tablet by mouth daily.    psyllium (METAMUCIL) 58.6 % packet Take 1 packet by mouth daily.    rosuvastatin (CRESTOR) 10 MG tablet Take 1 tablet (10 mg total) by mouth 2 (two) times a week. Monday and thursday (Patient taking differently: Take 10 mg by mouth 2 (two) times a week. Monday and Thursday nights)   shark liver oil-cocoa butter (PREPARATION H) 0.25-88.44 % suppository Place 1 suppository rectally daily as needed for hemorrhoids.    silodosin (RAPAFLO) 4 MG CAPS capsule Take 4 mg by mouth at bedtime.   tamsulosin (FLOMAX) 0.4 MG CAPS capsule Take 2 capsules (0.8 mg total) by mouth at bedtime.   [DISCONTINUED] nitroGLYCERIN (NITROSTAT) 0.4 MG SL tablet Place 0.4 mg under the tongue every 5 (five) minutes as  needed for chest pain.     Allergies:   Doxycycline, Lipitor [atorvastatin], Morphine and related, Niacin and related, Ramipril, Sulfa antibiotics,  and Codeine   Social History   Socioeconomic History   Marital status: Married    Spouse name: Not on file   Number of children: 3   Years of education: Not on file   Highest education level: Not on file  Occupational History   Not on file  Tobacco Use   Smoking status: Former Smoker    Packs/day: 2.00    Years: 3.00    Pack years: 6.00    Types: Cigarettes    Quit date: 01/12/1970    Years since quitting: 49.1   Smokeless tobacco: Never Used  Substance and Sexual Activity   Alcohol use: No    Alcohol/week: 0.0 standard drinks   Drug use: No   Sexual activity: Not on file  Other Topics Concern   Not on file  Social History Narrative   Not on file   Social Determinants of Health   Financial Resource Strain:    Difficulty of Paying Living Expenses: Not on file  Food Insecurity:    Worried About Charity fundraiser in the Last Year: Not on file   [employer] of Food in the Last Year: Not on file  Transportation Needs:    Lack of Transportation (Medical): Not on file   Lack of Transportation (Non-Medical): Not on file  Physical Activity:    Days of Exercise per Week: Not on file   Minutes of Exercise per Session: Not on file  Stress:    Feeling of Stress : Not on file  Social Connections:    Frequency of Communication with Friends and Family: Not on file   Frequency of Social Gatherings with Friends and Family: Not on file   Attends Religious Services: Not on file   Active Member of Clubs or Organizations: Not on file   Attends Archivist Meetings: Not on file   Marital Status: Not on file     Family History: The patient's family history includes Cancer in his brother, cousin, father, and another family member; Colon cancer in his father; Colon polyps in his brother, brother, and sister;  Diabetes in his paternal uncle; Heart attack (age of onset: 71) in his brother; Stroke in his father. There is no history of Arthritis, Asthma, COPD, Rectal cancer, Stomach cancer, or Esophageal cancer.  ROS:   Please see the history of present illness.     All other systems reviewed and are negative.  EKGs/Labs/Other Studies Reviewed:    The following studies were reviewed today:  Cath 06/28/18 Conclusion    Prox LAD to Mid LAD lesion is 40% stenosed.  Mid LAD lesion is 50% stenosed.  LPDA lesion is 65% stenosed.  The left ventricular systolic function is normal.  LV end diastolic pressure is normal.  The left ventricular ejection fraction is 55-65% by visual estimate.  1. Nonobstructive CAD 2. Normal LV function 3. Normal LVEDP  Plan: compared to prior cardiac cath in 2012 there is no significant change. Continue medical therapy. Consider other causes of dyspnea. Anticipate DC later today after 4 hours hydration     EKG:  EKG is not ordered today.   Recent Labs: 06/28/2018: ALT 15; Hemoglobin 11.7; Platelets 217 07/04/2018: BUN 13; Creatinine, Ser 1.34; Potassium 4.1; Sodium 140  Recent Lipid Panel    Component Value Date/Time   CHOL 96 06/28/2018 0603   CHOL 135 01/17/2018 0906   TRIG 87 06/28/2018 0603   TRIG 116 01/15/2006 0828   HDL 23 (L) 06/28/2018 [id number]  HDL 35 (L) 01/17/2018 0906   CHOLHDL 4.2 06/28/2018 0603   VLDL 17 06/28/2018 0603   LDLCALC 56 06/28/2018 0603   LDLCALC 74 01/17/2018 0906   LDLDIRECT 74.7 02/21/2009 0932    Physical Exam:    VS:  BP 100/65    Pulse 99    Temp (!) 97.2 F (36.2 C)    Ht 6\' 1"  (1.854 m)    Wt 226 lb (102.5 kg)    SpO2 98%    BMI 29.82 kg/m     Wt Readings from Last 3 Encounters:  02/16/19 226 lb (102.5 kg)  10/24/18 219 lb 6.4 oz (99.5 kg)  09/01/18 222 lb (100.7 kg)     GEN:  Well nourished, well developed in no acute distress HEENT: Normal NECK: No JVD; No carotid bruits LYMPHATICS: No  lymphadenopathy CARDIAC: RRR, no murmurs, rubs, gallops RESPIRATORY:  Clear to auscultation without rales, wheezing or rhonchi  ABDOMEN: Soft, non-tender, non-distended MUSCULOSKELETAL:  No edema; No deformity  SKIN: Warm and dry NEUROLOGIC:  Alert and oriented x 3 PSYCHIATRIC:  Normal affect   ASSESSMENT:    1. Coronary artery disease involving native coronary artery of native heart without angina pectoris   2. Chronic obstructive pulmonary disease, unspecified COPD type ([place])   3. Essential hypertension   4. Pure hypercholesterolemia    PLAN:    In order of problems listed above:  1. Nonobstructive CAD, LHC 06/2018 essentially unchanged from 2012 cath - lopressor and cosopt stopped for bradycardia and dizziness upon standing - continue ASA   2. Hyperlipidemia 06/28/2018: Cholesterol 96; HDL 23; LDL Cholesterol 56; Triglycerides 87; VLDL 17 - continue fenofibrate, fish oil, and crestor   3. Hypertension  Pressures are well-controlled.   4. CKD stage III - sCr 1.34 post cath, stable and at baseline   5. COPD/chronic brochiectasis.  - suggests that he follow up with pulmonary.     Medication Adjustments/Labs and Tests Ordered: Current medicines are reviewed at length with the patient today.  Concerns regarding medicines are outlined above.  No orders of the defined types were placed in this encounter.  Meds ordered this encounter  Medications   nitroGLYCERIN (NITROSTAT) 0.4 MG SL tablet    Sig: Place 1 tablet (0.4 mg total) under the tongue every 5 (five) minutes as needed for chest pain.    Dispense:  25 tablet    Refill:  6    Signed, David Offner Martinique, MD  02/16/2019 10:15 AM    [facility]

## 2019-02-14 DIAGNOSIS — M5414 Radiculopathy, thoracic region: Secondary | ICD-10-CM | POA: Diagnosis not present

## 2019-02-14 DIAGNOSIS — M9902 Segmental and somatic dysfunction of thoracic region: Secondary | ICD-10-CM | POA: Diagnosis not present

## 2019-02-14 DIAGNOSIS — Z8546 Personal history of malignant neoplasm of prostate: Secondary | ICD-10-CM | POA: Diagnosis not present

## 2019-02-15 DIAGNOSIS — M5412 Radiculopathy, cervical region: Secondary | ICD-10-CM | POA: Diagnosis not present

## 2019-02-16 ENCOUNTER — Other Ambulatory Visit: Payer: Self-pay

## 2019-02-16 ENCOUNTER — Ambulatory Visit: Payer: PPO | Admitting: Cardiology

## 2019-02-16 ENCOUNTER — Telehealth: Payer: Self-pay | Admitting: Cardiology

## 2019-02-16 ENCOUNTER — Encounter: Payer: Self-pay | Admitting: Cardiology

## 2019-02-16 VITALS — BP 100/65 | HR 99 | Temp 97.2°F | Ht 73.0 in | Wt 226.0 lb

## 2019-02-16 DIAGNOSIS — I10 Essential (primary) hypertension: Secondary | ICD-10-CM | POA: Diagnosis not present

## 2019-02-16 DIAGNOSIS — J449 Chronic obstructive pulmonary disease, unspecified: Secondary | ICD-10-CM | POA: Diagnosis not present

## 2019-02-16 DIAGNOSIS — E7800 Pure hypercholesterolemia, unspecified: Secondary | ICD-10-CM

## 2019-02-16 DIAGNOSIS — I2510 Atherosclerotic heart disease of native coronary artery without angina pectoris: Secondary | ICD-10-CM | POA: Diagnosis not present

## 2019-02-16 MED ORDER — NITROGLYCERIN 0.4 MG SL SUBL: 0.4000 mg | SUBLINGUAL_TABLET | SUBLINGUAL | 6 refills | Status: AC | PRN

## 2019-02-16 NOTE — Telephone Encounter (Signed)
New message   Patient states that his wife will be coming with him to the 10:00 am office visit today. The patient is hearing impaired.

## 2019-02-16 NOTE — Telephone Encounter (Signed)
Is this ok?

## 2019-02-17 NOTE — Telephone Encounter (Signed)
Spoke to wife 2/4 ok for her to back with husband at appointment.

## 2019-02-19 ENCOUNTER — Other Ambulatory Visit: Payer: Self-pay | Admitting: Cardiology

## 2019-02-21 DIAGNOSIS — M5412 Radiculopathy, cervical region: Secondary | ICD-10-CM | POA: Diagnosis not present

## 2019-02-22 DIAGNOSIS — N401 Enlarged prostate with lower urinary tract symptoms: Secondary | ICD-10-CM | POA: Diagnosis not present

## 2019-02-22 DIAGNOSIS — R3912 Poor urinary stream: Secondary | ICD-10-CM | POA: Diagnosis not present

## 2019-02-22 DIAGNOSIS — C61 Malignant neoplasm of prostate: Secondary | ICD-10-CM | POA: Diagnosis not present

## 2019-02-24 DIAGNOSIS — M542 Cervicalgia: Secondary | ICD-10-CM | POA: Diagnosis not present

## 2019-02-28 ENCOUNTER — Telehealth: Payer: Self-pay

## 2019-02-28 DIAGNOSIS — M5412 Radiculopathy, cervical region: Secondary | ICD-10-CM | POA: Diagnosis not present

## 2019-02-28 DIAGNOSIS — M542 Cervicalgia: Secondary | ICD-10-CM | POA: Diagnosis not present

## 2019-02-28 DIAGNOSIS — M5032 Other cervical disc degeneration, mid-cervical region, unspecified level: Secondary | ICD-10-CM | POA: Diagnosis not present

## 2019-02-28 NOTE — Telephone Encounter (Signed)
    [facility] Pre-operative Risk Assessment    Request for surgical clearance:  1. What type of surgery is being performed? ACDF C5-7  2. When is this surgery scheduled? TBD  3. What type of clearance is required (medical clearance vs. Pharmacy clearance to hold med vs. Both)? Both  4. Are there any medications that need to be held prior to surgery and how long? Aspirin  5. Practice name and name of physician performing surgery? [facility]  Dr.Brooks  6. What is your office phone number [phone]  David Cantrell )    7.   What is your office fax number  [phone]  8.   Anesthesia type  Not listed   David Cantrell 02/28/2019, 5:13 PM  _________________________________________________________________   (provider comments below)

## 2019-03-01 ENCOUNTER — Telehealth: Payer: Self-pay

## 2019-03-01 DIAGNOSIS — M5412 Radiculopathy, cervical region: Secondary | ICD-10-CM | POA: Insufficient documentation

## 2019-03-01 DIAGNOSIS — Z01818 Encounter for other preprocedural examination: Secondary | ICD-10-CM | POA: Insufficient documentation

## 2019-03-01 NOTE — Telephone Encounter (Deleted)
Error

## 2019-03-01 NOTE — Telephone Encounter (Signed)
Left VM to call back 

## 2019-03-01 NOTE — Progress Notes (Signed)
Virtual Visit via Telephone Note  I connected with David Cantrell on 03/02/19 at  9:30 AM EST by telephone and verified that I am speaking with the correct person using two identifiers.   I discussed the limitations of evaluation and management by telemedicine and the availability of in person appointments. The patient expressed understanding and agreed to proceed.  Present for the visit:  Myself, Dr Billey Gosling, Marlin Canary.  The patient is currently at home and I am in the office.    No referring provider.    History of Present Illness: He is here for pre-operative clearance at the request of Dr Rolena Infante for ACDF C5-7 scheduled for TBS.   He has right neck pain and severe right arm pain secondary to cervical radiculopathy.  Conservative measures have not helped. He is taking pain medication which helps a little.  He has to walk around with his arm raised up to provide some relief of the pain.   He denies any personal or family history of problems with anesthesia or bleeding/blood clot problems.    He has no concerns.  He is taking all his medication as prescribed.  He is not exercising regularly.  With his daily activities he denies chest pain, palpitations, SOB and lightheadedness.     He follows with Cardiology and just recently saw then on 02/16/2019.   BP today 132/72  Pulse 85   Review of Systems  Constitutional: Negative for chills and fever.  HENT: Positive for nosebleeds.   Eyes: Negative for blurred vision and double vision.  Respiratory: Negative for cough, shortness of breath and wheezing.   Cardiovascular: Negative for chest pain, palpitations and leg swelling.  Gastrointestinal: Negative for abdominal pain, blood in stool, constipation, diarrhea, heartburn and nausea.  Genitourinary: Negative for dysuria and hematuria.  Neurological: Positive for dizziness (occ from pain medication). Negative for headaches.      Social History   Socioeconomic History  . Marital  status: Married    Spouse name: Not on file  . Number of children: 3  . Years of education: Not on file  . Highest education level: Not on file  Occupational History  . Not on file  Tobacco Use  . Smoking status: Former Smoker    Packs/day: 2.00    Years: 3.00    Pack years: 6.00    Types: Cigarettes    Quit date: 01/12/1970    Years since quitting: 49.1  . Smokeless tobacco: Never Used  Substance and Sexual Activity  . Alcohol use: No    Alcohol/week: 0.0 standard drinks  . Drug use: No  . Sexual activity: Not on file  Other Topics Concern  . Not on file  Social History Narrative  . Not on file   Social Determinants of Health   Financial Resource Strain:   . Difficulty of Paying Living Expenses: Not on file  Food Insecurity:   . Worried About Charity fundraiser in the Last Year: Not on file  . Ran Out of Food in the Last Year: Not on file  Transportation Needs:   . Lack of Transportation (Medical): Not on file  . Lack of Transportation (Non-Medical): Not on file  Physical Activity:   . Days of Exercise per Week: Not on file  . Minutes of Exercise per Session: Not on file  Stress:   . Feeling of Stress : Not on file  Social Connections:   . Frequency of Communication with Friends and Family: Not on  file  . Frequency of Social Gatherings with Friends and Family: Not on file  . Attends Religious Services: Not on file  . Active Member of Clubs or Organizations: Not on file  . Attends Archivist Meetings: Not on file  . Marital Status: Not on file     Observations/Objective:  Lab Results  Component Value Date   WBC 6.0 06/28/2018   HGB 11.7 (L) 06/28/2018   HCT 34.2 (L) 06/28/2018   PLT 217 06/28/2018   GLUCOSE 86 07/04/2018   CHOL 96 06/28/2018   TRIG 87 06/28/2018   HDL 23 (L) 06/28/2018   LDLDIRECT 74.7 02/21/2009   LDLCALC 56 06/28/2018   ALT 15 06/28/2018   AST 16 06/28/2018   NA 140 07/04/2018   K 4.1 07/04/2018   CL 104 07/04/2018    CREATININE 1.34 (H) 07/04/2018   BUN 13 07/04/2018   CO2 22 07/04/2018   TSH 4.69 (H) 01/19/2017   PSA 1.72 02/21/2009   INR 1.08 03/31/2016   HGBA1C 5.6 03/19/2015     Assessment and Plan:  See Problem List for Assessment and Plan of chronic medical problems.   Follow Up Instructions:    I discussed the assessment and treatment plan with the patient. The patient was provided an opportunity to ask questions and all were answered. The patient agreed with the plan and demonstrated an understanding of the instructions.   The patient was advised to call back or seek an in-person evaluation if the symptoms worsen or if the condition fails to improve as anticipated.  Time spent on telephone call:  24 minutes  [name], MD

## 2019-03-01 NOTE — Telephone Encounter (Signed)
   Primary Cardiologist: Peter Martinique, MD  Chart reviewed as part of pre-operative protocol coverage. Patient was contacted 03/01/2019 in reference to pre-operative risk assessment for pending surgery as outlined below.  David Cantrell was last seen on 02/16/19 by Dr. Martinique.  Since that day, David Cantrell has done well. He can complete more than 4.0 METS. He is limited by his back/neck pain.   Given his known CAD that is medically managed, prefer to continue ASA during the perioperative period. If doing so will increase morbidity/mortality, may hold for 5-7 days if absolutely necessary.    Therefore, based on ACC/AHA guidelines, the patient would be at acceptable risk for the planned procedure without further cardiovascular testing.   I will route this recommendation to the requesting party via Epic fax function and remove from pre-op pool.  Please call with questions.  Ledora Bottcher, PA 03/01/2019, 3:54 PM

## 2019-03-02 ENCOUNTER — Other Ambulatory Visit: Payer: Self-pay

## 2019-03-02 ENCOUNTER — Encounter: Payer: Self-pay | Admitting: Internal Medicine

## 2019-03-02 ENCOUNTER — Ambulatory Visit (INDEPENDENT_AMBULATORY_CARE_PROVIDER_SITE_OTHER): Payer: PPO | Admitting: Internal Medicine

## 2019-03-02 DIAGNOSIS — I2583 Coronary atherosclerosis due to lipid rich plaque: Secondary | ICD-10-CM

## 2019-03-02 DIAGNOSIS — N1831 Chronic kidney disease, stage 3a: Secondary | ICD-10-CM

## 2019-03-02 DIAGNOSIS — M5412 Radiculopathy, cervical region: Secondary | ICD-10-CM

## 2019-03-02 DIAGNOSIS — E782 Mixed hyperlipidemia: Secondary | ICD-10-CM | POA: Diagnosis not present

## 2019-03-02 DIAGNOSIS — Z01818 Encounter for other preprocedural examination: Secondary | ICD-10-CM

## 2019-03-02 DIAGNOSIS — I10 Essential (primary) hypertension: Secondary | ICD-10-CM

## 2019-03-02 DIAGNOSIS — I2510 Atherosclerotic heart disease of native coronary artery without angina pectoris: Secondary | ICD-10-CM

## 2019-03-02 NOTE — Assessment & Plan Note (Addendum)
No concerning symptoms of angina or COPD. Overall low risk for procedure.  Chronic medical problems are stable and well controlled. Continue current medications Ok to stop all supplements and ASA prior to surgery Cleared medically for surgery  Has recently seen cardiology Will send clearance form to surgery

## 2019-03-02 NOTE — Assessment & Plan Note (Signed)
chronic Continue statin

## 2019-03-02 NOTE — Assessment & Plan Note (Signed)
Chronic All conservative measures have not been successful in treating his symptoms and will be scheduled for surgery soon He is taking gabapentin at night and vicodin as needed

## 2019-03-02 NOTE — Assessment & Plan Note (Signed)
Chronic BP well controlled at home and when he has been in the office Current regimen effective and well tolerated Continue current medications at current doses

## 2019-03-02 NOTE — Assessment & Plan Note (Signed)
Chronic Has been stable  

## 2019-03-02 NOTE — Assessment & Plan Note (Signed)
Chronic Following with Dr Martinique and just saw him No concerning symptoms for angina Continue current medications

## 2019-03-03 DIAGNOSIS — M5412 Radiculopathy, cervical region: Secondary | ICD-10-CM | POA: Diagnosis not present

## 2019-03-06 ENCOUNTER — Ambulatory Visit: Payer: Self-pay | Admitting: Orthopedic Surgery

## 2019-03-07 ENCOUNTER — Ambulatory Visit: Payer: Self-pay | Admitting: Orthopedic Surgery

## 2019-03-07 NOTE — H&P (Signed)
Subjective:   David Cantrell is a pleasant 79 year old male with past medical history Significant for heart problems , lung disease who has had 3 weeks of significant neck pain and radiular right arm pain and weakness. His pain is severe, and debilitating despite prescription medications and he would like to move forward with surgery. He is scheduled for ACDF C5-7 on 03/15/19 At [facility] with Dr. Rolena Infante.  Patient Active Problem List   Diagnosis Date Noted  . Cervical radiculopathy 03/01/2019  . Preop examination 03/01/2019  . Shortness of breath 07/10/2018  . Lightheadedness 07/10/2018  . Sinus bradycardia 06/28/2018  . CKD (chronic kidney disease) stage 3, GFR 30-59 ml/min   . Diverticulitis 10/11/2017  . Lumbar back pain 04/13/2017  . Piriformis syndrome 01/18/2017  . Thoracic aortic aneurysm ([place]) 04/12/2016  . Malignant neoplasm of prostate ([place]) 01/31/2016  . Centrilobular emphysema ([place]) 12/12/2014  . Bronchiectasis without acute exacerbation ([place]) 09/11/2014  . Bulging lumbar disc 10/03/2013  . Open-angle glaucoma 05/05/2013  . 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 Multiple pulmonary nodules 02/24/2013  . Basal cell carcinoma of ala nasi 02/24/2013  . At risk for obstructive sleep apnea 02/09/2013  . 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 COLONIC POLYPS, HX OF 12/13/2007  . Hyperlipidemia 12/03/2006  . Essential hypertension 12/03/2006  . Coronary atherosclerosis 12/03/2006  . 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 Nevus, non-neoplastic 09/24/2006  . RHINITIS, ALLERGIC NOS 09/24/2006  . Exercise-induced bronchospasm 09/24/2006   Past Medical History:  Diagnosis Date  . Allergy   . Aortic ectasia (HCC)    a. 2.5cm aortic ectasia by CT 05/2017 (follow-up due 05/2022).  Marland Kitchen Arthritis   . Bronchiectasis ([place])    per pulmologist note -- dr Lake Bells-- related to multiple episodes pneumonia as adult  . CAD (coronary artery disease)    a. L PDA  obstructive disease 2012, managed medically.  . Cancer ([place])    basal cell ca  . Cataract   . Centrilobular emphysema ([place])   . Congenital renal cyst   . COPD (chronic obstructive pulmonary disease) (HCC)    pulmologist-  dr Lake Bells  . Diverticulosis of colon   . Emphysema of lung ([place])   . First degree heart block   . Hemorrhoids   . History of adenomatous polyp of colon    hx multiple tubular adenoma's and hyperplastic polyp's since 2001--- last colonoscopy 05-29-2015  . History of basal cell carcinoma excision    10-31-2012  nose  . History of external beam radiation therapy 02-01-2016  to 03-16-2016   prostate 45Gy  plus radioactive prostate seed implants 04-07-2016  . History of kidney stones   . Hyperlipidemia   . Hypertension   . 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 Multiple pulmonary nodules    S/P  BILATERAL BRONCHOSCOPY  09-24-2014  no infection --    . Open-angle glaucoma    RIGHT EYE ONLY/OPEN ANGLE  . Prostate cancer [facility]) urologist-  dr eskridge/  oncologist-  dr Tammi Klippel   Stage T1c,  Gleason3+4, PSA 4.7--  s/p  external beam radiation therapy 02-01-2016 to 03-16-2016  . Seasonal allergies   . Thoracic aortic aneurysm ([place])    a. [name] following -4.3cm by CT 06/2018.  Marland Kitchen Wears dentures    full upper and lower parital    Past Surgical History:  Procedure Laterality Date  . CARDIAC CATHETERIZATION  09-04-2002  DR Wynonia Lawman   NORMAL LVF/  MILD TO MODERATE CAD INVOLVING PROXIMAL 40%  AND MID 40%  LAD/  POSTERIOR DESCENDING CX 70%  . CARDIAC CATHETERIZATION  06-05-2010  DR Fidela Juneau   NORMAL LM/  40% PROXIMAL MID LAD/ 70% DISTAL CFX/  80% PROXIMAL PDA/  SMALL & NONDOMINANT RCA  . CARDIOVASCULAR STRESS TEST  06-01-2013   dr Wynonia Lawman   normal lexiscan myoview w/ no ischemia/ normal LV function and wall motion, ef 61%  . COLONOSCOPY    . COLONOSCOPY W/ POLYPECTOMY  last one 05-29-2015  . CYSTOSCOPY N/A 04/07/2016   Procedure: CYSTOSCOPY FLEXIBLE;  Surgeon:  Festus Aloe, MD;  Location: [facility];  Service: Urology;  Laterality: N/A;  no seeds found in bladder  . CYSTOSCOPY WITH RETROGRADE PYELOGRAM, URETEROSCOPY AND STENT PLACEMENT Right 02/02/2013   Procedure: CYSTOSCOPY WITH RETROGRADE PYELOGRAM, AND Right STENT PLACEMENT;  Surgeon: Molli Hazard, MD;  Location: [facility];  Service: Urology;  Laterality: Right;  . CYSTOSCOPY WITH RETROGRADE PYELOGRAM, URETEROSCOPY AND STENT PLACEMENT Right 02/15/2013   Procedure: CYSTOSCOPY WITH RETROGRADE PYELOGRAM, URETEROSCOPY AND STENT EXCHANGE;  Surgeon: Molli Hazard, MD;  Location[facility];  Service: Urology;  Laterality: Right;  . ESI     ; for cervical radiculopathy  . HEMORRHOID SURGERY  07-03-1999  . HOLMIUM LASER APPLICATION Right [id number]   Procedure: HOLMIUM LASER APPLICATION;  Surgeon: Molli Hazard, MD;  Location: [facility];  Service: Urology;  Laterality: Right;  . LEFT HEART CATH AND CORONARY ANGIOGRAPHY N/A 06/28/2018   Procedure: LEFT HEART CATH AND CORONARY ANGIOGRAPHY;  Surgeon: Martinique, Peter M, MD;  Location: [place] [facility];  Service: Cardiovascular;  Laterality: N/A;  . POLYPECTOMY    . PROSTATE BIOPSY    . RADIOACTIVE SEED IMPLANT N/A 04/07/2016   Procedure: RADIOACTIVE SEED IMPLANT/BRACHYTHERAPY IMPLANT;  Surgeon: Festus Aloe, MD;  Location[facility];  Service: Urology;  Laterality: N/A;  55 seeds implanted  . SHOULDER OPEN ROTATOR CUFF REPAIR Bilateral RIGHT X2  LAST ONE 1997/  LEFT 2005   right inferior glenoid screw retained  . VIDEO BRONCHOSCOPY Bilateral 09/24/2014   Procedure: VIDEO BRONCHOSCOPY WITH FLUORO;  Surgeon: Juanito Doom, MD;  Location: [facility];  Service: Cardiopulmonary;  Laterality: Bilateral;    Current Outpatient Medications  Medication Sig Dispense Refill Last Dose  . acetaminophen (TYLENOL) 650 MG CR tablet Take 650 mg by mouth every 8 (eight) hours as  needed for pain.     Marland Kitchen alfuzosin (UROXATRAL) 10 MG 24 hr tablet Take 10 mg by mouth daily.     Marland Kitchen aspirin EC 81 MG tablet Take 81 mg by mouth at bedtime.      . bimatoprost (LUMIGAN) 0.01 % SOLN Place 1 drop into both eyes at bedtime.      . brimonidine (ALPHAGAN) 0.15 % ophthalmic solution Place 1 drop into the right eye 2 (two) times daily.      . Cholecalciferol (VITAMIN D) 50 MCG (2000 UT) CAPS Take 2,000 Units by mouth 2 (two) times daily after a meal.     . docusate sodium (COLACE) 100 MG capsule Take 100 mg by mouth daily.     . dorzolamide (TRUSOPT) 2 % ophthalmic solution Place 1 drop into the right eye 2 (two) times daily.      . fenofibrate 160 MG tablet Take 1 tablet by mouth once daily (Patient taking differently: Take 160 mg by mouth daily. ) 90 tablet 3   . fluticasone furoate-vilanterol (BREO  ELLIPTA) 200-25 MCG/INH AEPB Inhale 1 puff into the lungs daily. (Patient not taking: Reported on 03/06/2019) 1 each 5   . gabapentin (NEURONTIN) 300 MG capsule Take 300 mg by mouth at bedtime.     . Garlic 123XX123 MG CAPS Take 1,000 mg by mouth in the morning and at bedtime.      Marland Kitchen HYDROcodone-acetaminophen (NORCO) 10-325 MG tablet Take 1 tablet by mouth every 6 (six) hours as needed for moderate pain.      . hyoscyamine (LEVSIN SL) 0.125 MG SL tablet PLACE 1 TABLET UNDER THE TONGUE AS NEEDED (Patient not taking: Reported on 03/06/2019) 20 tablet 0   . Multiple Vitamin (MULTIVITAMIN WITH MINERALS) TABS tablet Take 1 tablet by mouth in the morning and at bedtime.      . Multiple Vitamins-Minerals (ICAPS AREDS 2 PO) Take 1 tablet by mouth 2 (two) times daily after a meal.      . nitroGLYCERIN (NITROSTAT) 0.4 MG SL tablet Place 1 tablet (0.4 mg total) under the tongue every 5 (five) minutes as needed for chest pain. 25 tablet 6   . Omega-3 Fatty Acids (FISH OIL) 1200 MG CAPS Take 1,200 mg by mouth in the morning and at bedtime.      . polyethylene glycol-electrolytes (NULYTELY) 420 g solution  peg-electrolyte solution 420 gram oral solution  TAKE AS DIRECTED     . PROAIR HFA 108 (90 Base) MCG/ACT inhaler Inhale 1-2 puffs into the lungs every 6 (six) hours as needed for wheezing or shortness of breath. 18 g 5   . Probiotic Product (ALIGN) 4 MG CAPS Take 4 mg by mouth daily.      . psyllium (METAMUCIL) 58.6 % packet Take 1 packet by mouth daily.      . rosuvastatin (CRESTOR) 10 MG tablet Take 1 tablet (10 mg total) by mouth 2 (two) times a week. Monday and thursday (Patient taking differently: Take 10 mg by mouth 2 (two) times a week. Monday and Thursday nights) 30 tablet 6   . tamsulosin (FLOMAX) 0.4 MG CAPS capsule Take 2 capsules (0.8 mg total) by mouth at bedtime. (Patient not taking: Reported on 03/06/2019)     . tiotropium (SPIRIVA) 18 MCG inhalation capsule Place 18 mcg into inhaler and inhale daily.      No current facility-administered medications for this visit.   Allergies  Allergen Reactions  . Doxycycline Other (See Comments)    Dizziness   . Lipitor [Atorvastatin] Other (See Comments)    MUSCLE ACHES  . Morphine And Related Other (See Comments)    Severe headache  . Niacin And Related Other (See Comments)    FLUSHING  . Ramipril Cough  . Sulfa Antibiotics Hives  . Codeine Other (See Comments)    CONSTIPATION    Social History   Tobacco Use  . Smoking status: Former Smoker    Packs/day: 2.00    Years: 3.00    Pack years: 6.00    Types: Cigarettes    Quit date: 01/12/1970    Years since quitting: 49.1  . Smokeless tobacco: Never Used  Substance Use Topics  . Alcohol use: No    Alcohol/week: 0.0 standard drinks    Family History  Problem Relation Age of Onset  . Stroke Father        in 57s  . Colon cancer Father        upper 11's  . Cancer Father        colon  . Heart attack Brother  43       stent  . Cancer Brother        pre cancerous bladder lesion  . Diabetes Paternal Uncle   . Cancer Cousin        prostate  . Cancer Other        bladder   . Colon polyps Sister   . Colon polyps Brother   . Colon polyps Brother   . Arthritis Neg Hx   . Asthma Neg Hx   . COPD Neg Hx   . Rectal cancer Neg Hx   . Stomach cancer Neg Hx   . Esophageal cancer Neg Hx     Review of Systems As stated in HPI  Objective:   Vitals: Ht: 5 ft 11 in 03/07/2019 10:27 am Wt: 223 lbs 03/07/2019 10:27 am BMI: 31.1 03/07/2019 10:27 am BP: 133/75 sitting L arm 03/07/2019 10:32 am Pulse: 103 bpm 03/07/2019 10:32 am  Clinical exam: Indi is a pleasant individual, who appears younger than their stated age. He is alert and orientated 3. No shortness of breath, chest pain. Heart: Regular rate and rhythm, no rubs, murmurs, or gallops Lungs: Clear to auscultation bilaterally Abdomen is soft and non-tender, negative loss of bowel and bladder control, no rebound tenderness. Negative: skin lesions abrasions contusions  Peripheral pulses: 2+ radial artery pulses bilaterally. Compartment soft and nontender.  Gait pattern: Normal  Assistive devices: None  Neuro: Significant neck and radicular arm pain. Positive Spurling sign with reproduction of right radicular pain. 4/5 right bicep, wrist extensor, triceps strength. Remainder of the upper extremity is 5/5. Positive numbness and dysesthesias in the right C6 and C7 dermatome. Negative Hoffman test, no clonus, 1+ deep tendon reflexes symmetrical in the upper extremity.  Musculoskeletal: Moderate to significant neck pain with palpation and range of motion. Pain radiates into the right upper extremity. Patient maintains his flexed and over his head to decrease the radicular arm pain. He has no significant shoulder, elbow, wrist pain with isolated joint range of motion.  X-rays of the cervical spine taken at [facility] on 02/15/19 were reviewed: Multilevel degenerative disc disease most noted at C6-7. There is also degeneration at C3-4 and to a lesser degree C5-6. Sagittal alignment is maintained.  Cervical  MRI: completed on 02/24/19 was reviewed with the patient. It was completed at [facility] I have independently reviewed the images as well as the radiology report. No cord signal changes. Severe right foraminal narrowing with irritation to the exiting C4 nerve root. No spinal canal stenosis or contact of the cord. C4-5: Mild central disc protrusion without spinal canal stenosis. C5-6: Hard disc osteophyte asymmetrical to the right with significant foraminal stenosis causing compression of the right C6 nerve root. No spinal canal stenosis. C6-7: Severe bulging disc osteophyte complex producing severe right lateral recess/foraminal narrowing with compression of the right C7 nerve root. No fracture or abnormal marrow signal changes noted.  Assessment:   Tor is a very pleasant 79 year old gentleman with long-standing neck pain in 2 weeks of severe radicular arm pain. Imaging studies confirm C6 and C7 right neural compression due to hard disc osteophyte and degenerative cervical disc disease. He does have disease at the C3-4 level producing right C4 nerve root irritation. Fortunately he has no clinical or radiographic evidence of myelopathy. At this point time he has focal motor and sensory deficits in the C6 and C7 dermatome producing horrific radicular arm pain. We have had a long discussion about his imaging studies and treatment options.  I am hesitant to recommend a 4 level ACDF to address the C3-4, C5-6, and C6-7 pathology. There is no significant pathology at the C4-5 level, but if I were to address the C3-4 disease I would also have to stabilize the C4-5 level because of the significant increased potential risk of iatrogenic degeneration. At this point in order to address the acute neurological deficits in the C6 and C7 dermatome I am recommending a 2 level ACDF. This would allow me to remove the hard disc osteophyte producing foraminal stenosis and decrease the C6 and C7 neural compression. My hope  is that the right upper extremity weakness will improve as will the radicular pain. I did tell him that he may continue to have neck pain from the degeneration of his cervical spine and the C3-4 disease process. Clinically, however, I believe the C6 and C7 nerve root irritation is primary pain generator.   I have gone over the surgical procedure in great detail with the patient and his wife and all of their questions were encouraged and addressed. Risks and benefits of surgery were discussed with the patient. These include: Infection, bleeding, death, stroke, paralysis, ongoing or worse pain, need for additional surgery, nonunion, leak of spinal fluid, adjacent segment degeneration requiring additional fusion surgery. Pseudoarthrosis (nonunion)requiring supplemental posterior fixation. Throat pain, swallowing difficulties, hoarseness or change in voice.  Plan:   Plan on moving forward with a 2 level ACDF C5-7. Pending preoperative labs at [facility].  Because of the multilevel nature of the procedure I am recommending a postoperative external bone stimulator to improve fusion rates.  We have obtained preoperative clearance from the patient's primary care provider as well as his cardiologist.  I have reviewed the patient's medication list with him. He is on aspirin. Per his cardiology clearance, it is recommended that he continue aspirin during the perioperative period and less increased chance of morbidity/mortality if it is not discontinued and it is absolutely necessary. If it is absolutely necessary their recommendation is to Hold 5-7 days. Given the nature of this procedure and the risk for expanding hematoma in the neck, Dr. Rolena Infante and I do believe it is absolutely necessary to hold aspirin and reduce morbidity and mortality. I therefore, I have advised the patient to hold the aspirin 5 days before surgery and we will plan on resuming 48 hours postoperatively.  We have also discussed the  post-operative recovery period to include: bathing/showering restrictions, wound healing, activity (and driving) restrictions, medications/pain mangement.  We have also discussed post-operative redflags to include: signs and symptoms of postoperative infection, DVT/PE.  Patient has an Designer, multimedia. He has preoperative testing at [facility] scheduled.   All of patient's questions were invited and answered.  I have refilled the patient's pain medication. Patient has discontinued gabapentin due to side effects.  Follow-up: 2 weeks postoperatively.

## 2019-03-08 NOTE — Progress Notes (Signed)
[place] ([place]), [place] - [address] [place] ([place]) [place] [address] Phone: [phone] Fax: [phone]    Your procedure is scheduled on Wednesday, March 3rd.  Report to [facility] Main Entrance "A" at 6:30 A.M., and check in at the Admitting office.  Call this number if you have problems the morning of surgery:  [phone]  Call [phone] if you have any questions prior to your surgery date Monday-Friday 8am-4pm   Remember:  Do not eat or drink after midnight the night before your surgery    Take these medicines the morning of surgery with A SIP OF WATER  alfuzosin (UROXATRAL) brimonidine (ALPHAGAN)/ eye drops docusate sodium (COLACE) dorzolamide (TRUSOPT)/ eye drops fenofibrate  tiotropium (SPIRIVA)   If needed - acetaminophen (TYLENOL), HYDROcodone-acetaminophen (NORCO), nitroGLYCERIN (NITROSTAT), PROAIR HFA 108 (90 Base)/inhaler,     Bring inhalers with you the morning of surgery  Follow your surgeon's instructions on when to stop Aspirin.  If no instructions were given by your surgeon then you will need to call the office to get those instructions.    As of today, STOP taking Aleve, Naproxen, Ibuprofen, Motrin, Advil, Goody's, BC's, all herbal medications, fish oil, and all vitamins.   The Morning of Surgery  Do not wear jewelry.  Do not wear lotions, powders, colognes, or deodorant  Men may shave face and neck.  Do not bring valuables to the hospital.  [facility] is not responsible for any belongings or valuables.  If you are a smoker, DO NOT Smoke 24 hours prior to surgery  If you wear a CPAP at night please bring your mask the morning of surgery   Remember that you must have someone to transport you home after your surgery, and remain with you for 24 hours if you are discharged the same day.  Please bring cases for contacts, glasses, hearing aids, dentures or bridgework because it cannot be worn into  surgery.   Leave your suitcase in the car.  After surgery it may be brought to your room.  For patients admitted to the hospital, discharge time will be determined by your treatment team.  Patients discharged the day of surgery will not be allowed to drive home.   Special instructions:   [place]- Preparing For Surgery  Before surgery, you can play an important role. Because skin is not sterile, your skin needs to be as free of germs as possible. You can reduce the number of germs on your skin by washing with CHG (chlorahexidine gluconate) Soap before surgery.  CHG is an antiseptic cleaner which kills germs and bonds with the skin to continue killing germs even after washing.    Oral Hygiene is also important to reduce your risk of infection.  Remember - BRUSH YOUR TEETH THE MORNING OF SURGERY WITH YOUR REGULAR TOOTHPASTE  Please do not use if you have an allergy to CHG or antibacterial soaps. If your skin becomes reddened/irritated stop using the CHG.  Do not shave (including legs and underarms) for at least 48 hours prior to first CHG shower. It is OK to shave your face.  Please follow these instructions carefully.   1. Shower the NIGHT BEFORE SURGERY and the MORNING OF SURGERY with CHG Soap.   2. If you chose to wash your hair, wash your hair first as usual with your normal shampoo.  3. After you shampoo, rinse your hair and body thoroughly to remove the shampoo.  4. Use CHG  as you would any other liquid soap. You can apply CHG directly to the skin and wash gently with a scrungie or a clean washcloth.   5. Apply the CHG Soap to your body ONLY FROM THE NECK DOWN.  Do not use on open wounds or open sores. Avoid contact with your eyes, ears, mouth and genitals (private parts). Wash Face and genitals (private parts)  with your normal soap.   6. Wash thoroughly, paying special attention to the area where your surgery will be performed.  7. Thoroughly rinse your body with warm water  from the neck down.  8. DO NOT shower/wash with your normal soap after using and rinsing off the CHG Soap.  9. Pat yourself dry with a CLEAN TOWEL.  10. Wear CLEAN PAJAMAS to bed the night before surgery, wear comfortable clothes the morning of surgery  11. Place CLEAN SHEETS on your bed the night of your first shower and DO NOT SLEEP WITH PETS.  Day of Surgery: Please shower the morning of surgery with the CHG soap Do not apply any deodorants/lotions. Please wear clean clothes to the hospital/surgery center.   Remember to brush your teeth WITH YOUR REGULAR TOOTHPASTE.  Please read over the following fact sheets that you were given.

## 2019-03-09 ENCOUNTER — Ambulatory Visit (HOSPITAL_COMMUNITY)
Admission: RE | Admit: 2019-03-09 | Discharge: 2019-03-09 | Disposition: A | Discharge status: Home / Self Care | Payer: PPO | Source: Ambulatory Visit | Attending: Orthopedic Surgery | Admitting: Orthopedic Surgery

## 2019-03-09 ENCOUNTER — Other Ambulatory Visit: Payer: Self-pay

## 2019-03-09 ENCOUNTER — Encounter (HOSPITAL_COMMUNITY)
Admission: RE | Admit: 2019-03-09 | Discharge: 2019-03-09 | Disposition: A | Discharge status: Home / Self Care | Payer: PPO | Source: Ambulatory Visit | Attending: Orthopedic Surgery | Admitting: Orthopedic Surgery

## 2019-03-09 ENCOUNTER — Encounter (HOSPITAL_COMMUNITY): Payer: Self-pay

## 2019-03-09 DIAGNOSIS — Z01818 Encounter for other preprocedural examination: Secondary | ICD-10-CM

## 2019-03-09 DIAGNOSIS — E785 Hyperlipidemia, unspecified: Secondary | ICD-10-CM | POA: Diagnosis not present

## 2019-03-09 DIAGNOSIS — Z87891 Personal history of nicotine dependence: Secondary | ICD-10-CM | POA: Insufficient documentation

## 2019-03-09 DIAGNOSIS — Z8546 Personal history of malignant neoplasm of prostate: Secondary | ICD-10-CM | POA: Insufficient documentation

## 2019-03-09 DIAGNOSIS — Z85828 Personal history of other malignant neoplasm of skin: Secondary | ICD-10-CM | POA: Insufficient documentation

## 2019-03-09 DIAGNOSIS — Z87442 Personal history of urinary calculi: Secondary | ICD-10-CM | POA: Insufficient documentation

## 2019-03-09 DIAGNOSIS — Z79899 Other long term (current) drug therapy: Secondary | ICD-10-CM | POA: Diagnosis not present

## 2019-03-09 DIAGNOSIS — Z7982 Long term (current) use of aspirin: Secondary | ICD-10-CM | POA: Diagnosis not present

## 2019-03-09 DIAGNOSIS — I129 Hypertensive chronic kidney disease with stage 1 through stage 4 chronic kidney disease, or unspecified chronic kidney disease: Secondary | ICD-10-CM | POA: Insufficient documentation

## 2019-03-09 DIAGNOSIS — J439 Emphysema, unspecified: Secondary | ICD-10-CM | POA: Insufficient documentation

## 2019-03-09 DIAGNOSIS — M4722 Other spondylosis with radiculopathy, cervical region: Secondary | ICD-10-CM | POA: Insufficient documentation

## 2019-03-09 DIAGNOSIS — I700 Atherosclerosis of aorta: Secondary | ICD-10-CM | POA: Diagnosis not present

## 2019-03-09 DIAGNOSIS — I440 Atrioventricular block, first degree: Secondary | ICD-10-CM | POA: Insufficient documentation

## 2019-03-09 DIAGNOSIS — N183 Chronic kidney disease, stage 3 unspecified: Secondary | ICD-10-CM | POA: Diagnosis not present

## 2019-03-09 DIAGNOSIS — H3530 Unspecified macular degeneration: Secondary | ICD-10-CM | POA: Diagnosis not present

## 2019-03-09 DIAGNOSIS — I2510 Atherosclerotic heart disease of native coronary artery without angina pectoris: Secondary | ICD-10-CM | POA: Insufficient documentation

## 2019-03-09 DIAGNOSIS — Z01812 Encounter for preprocedural laboratory examination: Secondary | ICD-10-CM | POA: Diagnosis not present

## 2019-03-09 HISTORY — DX: Dyspnea, unspecified: R06.00

## 2019-03-09 HISTORY — DX: Pneumonia, unspecified organism: J18.9

## 2019-03-09 LAB — URINALYSIS, ROUTINE W REFLEX MICROSCOPIC
Bilirubin Urine: NEGATIVE
Glucose, UA: NEGATIVE mg/dL
Hgb urine dipstick: NEGATIVE
Ketones, ur: NEGATIVE mg/dL
Leukocytes,Ua: NEGATIVE
Nitrite: NEGATIVE
Protein, ur: NEGATIVE mg/dL
Specific Gravity, Urine: 1.021 (ref 1.005–1.030)
pH: 5 (ref 5.0–8.0)

## 2019-03-09 LAB — BASIC METABOLIC PANEL
Anion gap: 11 (ref 5–15)
BUN: 18 mg/dL (ref 8–23)
CO2: 23 mmol/L (ref 22–32)
Calcium: 9.4 mg/dL (ref 8.9–10.3)
Chloride: 105 mmol/L (ref 98–111)
Creatinine, Ser: 1.46 mg/dL — ABNORMAL HIGH (ref 0.61–1.24)
GFR calc Af Amer: 53 mL/min — ABNORMAL LOW (ref >60)
GFR calc non Af Amer: 46 mL/min — ABNORMAL LOW (ref >60)
Glucose, Bld: 104 mg/dL — ABNORMAL HIGH (ref 70–99)
Potassium: 3.6 mmol/L (ref 3.5–5.1)
Sodium: 139 mmol/L (ref 135–145)

## 2019-03-09 LAB — APTT: aPTT: 32 seconds (ref 24–36)

## 2019-03-09 LAB — CBC
HCT: 40.3 % (ref 39.0–52.0)
Hemoglobin: 13.3 g/dL (ref 13.0–17.0)
MCH: 31.3 pg (ref 26.0–34.0)
MCHC: 33 g/dL (ref 30.0–36.0)
MCV: 94.8 fL (ref 80.0–100.0)
Platelets: 302 10*3/uL (ref 150–400)
RBC: 4.25 MIL/uL (ref 4.22–5.81)
RDW: 12.8 % (ref 11.5–15.5)
WBC: 8.2 10*3/uL (ref 4.0–10.5)
nRBC: 0 % (ref 0.0–0.2)

## 2019-03-09 LAB — PROTIME-INR
INR: 1.1 (ref 0.8–1.2)
Prothrombin Time: 14.2 seconds (ref 11.4–15.2)

## 2019-03-09 LAB — SURGICAL PCR SCREEN
MRSA, PCR: NEGATIVE
Staphylococcus aureus: NEGATIVE

## 2019-03-09 NOTE — Progress Notes (Signed)
PCP - stacy burns Cardiologist - peter Martinique   Chest x-ray - 03/09/19 EKG - 06/2018 Stress Test - > 5 yrs ECHO - >5 yrs Cardiac Cath - 06/28/18  Sleep Study - na CPAP -     Blood Thinner Instructions: Aspirin Instructions: stopped 2/20    COVID TEST- 03/11/19   Anesthesia review: order for consult: ekg, cardiac/pul. Hx.  Patient denies shortness of breath, fever, cough and chest pain at PAT appointment   All instructions explained to the patient, with a verbal understanding of the material. Patient agrees to go over the instructions while at home for a better understanding. Patient also instructed to self quarantine after being tested for COVID-19. The opportunity to ask questions was provided.

## 2019-03-10 NOTE — Progress Notes (Signed)
Anesthesia Chart Review:  Case: [id number] Date/Time: 03/15/19 0815   Procedure: ANTERIOR CERVICAL DECOMPRESSION/DISCECTOMY FUSION C5-7 (N/A ) - 4 hrs   Anesthesia type: General   Pre-op diagnosis: Cervical spondylotic radiculopathy   Location: [facility] OR ROOM 04 / [place] [facility]   Surgeons: Melina Schools, MD      DISCUSSION: Patient is a 79 year old male scheduled for the above procedure.   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   Preoperative medical input from PCP Dr. Quay Burow following 03/02/19 virtual visit: "No concerning symptoms of angina or COPD. Overall low risk for procedure.  Chronic medical problems are stable and well controlled. Continue current medications Ok to stop all supplements and ASA prior to surgery Cleared medically for surgery  Has recently seen cardiology Will send clearance form to surgery" - He had reported BP 132/72, HR 85  Preoperative cardiology input outlined on 03/01/19 by Fabian Sharp, PA-C: "David Cantrell was last seen on 02/16/19 by Dr. Martinique.  Since that day, David Cantrell has done well. He can complete more than 4.0 METS. He is limited by his back/neck pain.  Given his known CAD that is medically managed, prefer to continue ASA during the perioperative period. If doing so will increase morbidity/mortality, may hold for 5-7 days if absolutely necessary.   Therefore, based on ACC/AHA guidelines, the patient would be at acceptable risk for the planned procedure without further cardiovascular testing."   Heart rate was primarily in the 70's  when I called and spoke with him on 03/10/19. Continues to deny CV symptoms. No recent pulmonary infections. Last ASA 03/04/19. Preoperative COVID-19 test is scheduled for 03/11/19. Anesthesia team to evaluate on the day of surgery.    VS: BP 124/75   Pulse (!) 116   Temp 36.9 C   Resp 18   Ht 6\' 1"  (1.854 m)   Wt 100.2 kg   SpO2 99%   BMI 29.13 kg/m   Anesthesia APP note notified of elevated HR at PAT, so I called and spoke with patient on 03/10/19, he has the ability to check his HR at home and watched his HR for several minutes with highest of 94 initially but then remained in the 70's for several minutes. He denied any palpitations, dizziness, SOB or chest pain. Primary issues is his pain related to his neck--prevents him for sleeping well.    PROVIDERS: [name], MD is PCP  Martinique, Peter, MD is cardiologist Gilford Raid, MD is CT surgeon (following TAA). Last visit 06/22/18 with one year follow-up recommended.Simonne Maffucci, MD is pulmonologist. Not seen since 2017. Patient reported stable pulmonary status. He denied any recent pulmonary infections. Festus Aloe, MD is urologist   LABS: Labs reviewed: Acceptable for surgery. Cr 1.46, previously 1.29-1.38 since 01/2018. He does not see a nephrologist.  (all labs ordered are listed, but only abnormal results are displayed)  Labs Reviewed  BASIC METABOLIC PANEL - Abnormal; Notable for the following components:      Result Value   Glucose, Bld 104 (*)    Creatinine, Ser 1.46 (*)    GFR calc non Af Amer 46 (*)  GFR calc Af Amer 53 (*)    All other components within normal limits  URINALYSIS, ROUTINE W REFLEX MICROSCOPIC - Abnormal; Notable for the following components:   Color, Urine AMBER (*)    APPearance HAZY (*)    All other components within normal limits  SURGICAL PCR SCREEN  APTT  CBC  PROTIME-INR     IMAGES: CXR 03/09/19: FINDINGS: Lung volumes are normal. No consolidative airspace disease.  No pleural effusions. No pneumothorax. No pulmonary nodule or mass noted. Pulmonary vasculature and the cardiomediastinal silhouette are within normal limits. Aortic atherosclerosis. IMPRESSION: 1.  No radiographic evidence of acute cardiopulmonary disease. 2. Aortic atherosclerosis.  CT Chest 06/22/18: IMPRESSION: 1. Stable 4.3 cm ascending thoracic aortic aneurysm without complicating features. Recommend annual imaging followup by CTA or MRA. This recommendation follows 2010 ACCF/AHA/AATS/ACR/ASA/SCA/SCAI/SIR/STS/SVM Guidelines for the Diagnosis and Management of Patients with Thoracic Aortic Disease. 2010; [id number]. 2. Coronary and Aortic Atherosclerosis (ICD10-I70.0). 3. Bilateral subcentimeter pulmonary nodules stable x5 years, presumably benign.   EKG: 06/24/18: Sinus rhythm with first-degree AV block Left axis deviation   CV: Cardiac cath 06/28/18: Conclusion  Prox LAD to Mid LAD lesion is 40% stenosed.  Mid LAD lesion is 50% stenosed.  LPDA lesion is 65% stenosed.  The left ventricular systolic function is normal.  LV end diastolic pressure is normal.  The left ventricular ejection fraction is 55-65% by visual estimate. 1. Nonobstructive CAD 2. Normal LV function 3. Normal LVEDP Plan: compared to prior cardiac cath in 2012 there is no significant change. Continue medical therapy. Consider other causes of dyspnea.   Last echo was > 5 years ago.   Past Medical History:  Diagnosis Date  . Allergy   . Aortic ectasia (HCC)    a. 2.5cm aortic ectasia by CT 05/2017 (follow-up due 05/2022).  Marland Kitchen Arthritis   . Bronchiectasis ([place])    per pulmologist note -- dr Lake Bells-- related to multiple episodes pneumonia as adult  . CAD (coronary artery disease)    a. L PDA obstructive disease 2012, managed medically.  . Cancer ([employer])    basal cell ca  . Cataract   . Centrilobular emphysema ([place])   . Congenital renal cyst   . COPD (chronic obstructive pulmonary  disease) (HCC)    pulmologist-  dr Lake Bells  . Diverticulosis of colon   . Dyspnea    upon exerting self -beengoing on 10 yrs.  . Emphysema of lung ([place])   . First degree heart block   . Hemorrhoids   . History of adenomatous polyp of colon    hx multiple tubular adenoma's and hyperplastic polyp's since 2001--- last colonoscopy 05-29-2015  . History of basal cell carcinoma excision    10-31-2012  nose  . History of external beam radiation therapy 02-01-2016  to 03-16-2016   prostate 45Gy  plus radioactive prostate seed implants 04-07-2016  . History of kidney stones   . Hyperlipidemia   . Hypertension   . 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 Multiple pulmonary nodules    S/P  BILATERAL BRONCHOSCOPY  09-24-2014  no infection --    . Open-angle glaucoma    RIGHT EYE ONLY/OPEN ANGLE  . Pneumonia    x3  . Prostate cancer [facility]) urologist-  dr eskridge/  oncologist-  dr Tammi Klippel   Stage T1c,  Gleason3+4, PSA 4.7--  s/p  external beam radiation therapy 02-01-2016 to 03-16-2016  . Seasonal allergies   . Thoracic aortic aneurysm ([place])  a. [name] following -4.3cm by CT 06/2018.  Marland Kitchen Wears dentures    full upper and lower parital    Past Surgical History:  Procedure Laterality Date  . CARDIAC CATHETERIZATION  09-04-2002  DR Wynonia Lawman   NORMAL LVF/  MILD TO MODERATE CAD INVOLVING PROXIMAL 40%  AND MID 40%  LAD/  POSTERIOR DESCENDING CX 70%  . CARDIAC CATHETERIZATION  06-05-2010  DR Fidela Juneau   NORMAL LM/  40% PROXIMAL MID LAD/ 70% DISTAL CFX/  80% PROXIMAL PDA/  SMALL & NONDOMINANT RCA  . CARDIOVASCULAR STRESS TEST  06-01-2013   dr Wynonia Lawman   normal lexiscan myoview w/ no ischemia/ normal LV function and wall motion, ef 61%  . COLONOSCOPY    . COLONOSCOPY W/ POLYPECTOMY  last one 05-29-2015  . CYSTOSCOPY N/A 04/07/2016   Procedure: CYSTOSCOPY FLEXIBLE;  Surgeon: Festus Aloe, MD;  [facility];  Service: Urology;  Laterality: N/A;  no seeds found in  bladder  . CYSTOSCOPY WITH RETROGRADE PYELOGRAM, URETEROSCOPY AND STENT PLACEMENT Right 02/02/2013   Procedure: CYSTOSCOPY WITH RETROGRADE PYELOGRAM, AND Right STENT PLACEMENT;  Surgeon: Molli Hazard, MD;  Location: [facility];  Service: Urology;  Laterality: Right;  . CYSTOSCOPY WITH RETROGRADE PYELOGRAM, URETEROSCOPY AND STENT PLACEMENT Right 02/15/2013   Procedure: CYSTOSCOPY WITH RETROGRADE PYELOGRAM, URETEROSCOPY AND STENT EXCHANGE;  Surgeon: Molli Hazard, MD;  Location: [facility];  Service: Urology;  Laterality: Right;  . ESI     ; for cervical radiculopathy  . HEMORRHOID SURGERY  07-03-1999  . HOLMIUM LASER APPLICATION Right [id number]   Procedure: HOLMIUM LASER APPLICATION;  Surgeon: Molli Hazard, MD;  Location: [facility];  Service: Urology;  Laterality: Right;  . LEFT HEART CATH AND CORONARY ANGIOGRAPHY N/A 06/28/2018   Procedure: LEFT HEART CATH AND CORONARY ANGIOGRAPHY;  Surgeon: Martinique, Peter M, MD;  Location: [place] [facility];  Service: Cardiovascular;  Laterality: N/A;  . POLYPECTOMY    . PROSTATE BIOPSY    . RADIOACTIVE SEED IMPLANT N/A 04/07/2016   Procedure: RADIOACTIVE SEED IMPLANT/BRACHYTHERAPY IMPLANT;  Surgeon: Festus Aloe, MD;  Location: [facility];  Service: Urology;  Laterality: N/A;  55 seeds implanted  . SHOULDER OPEN ROTATOR CUFF REPAIR Bilateral RIGHT X2  LAST ONE 1997/  LEFT 2005   right inferior glenoid screw retained  . VIDEO BRONCHOSCOPY Bilateral 09/24/2014   Procedure: VIDEO BRONCHOSCOPY WITH FLUORO;  Surgeon: Juanito Doom, MD;  Location: [place];  Service: Cardiopulmonary;  Laterality: Bilateral;    MEDICATIONS: . acetaminophen (TYLENOL) 650 MG CR tablet  . alfuzosin (UROXATRAL) 10 MG 24 hr tablet  . aspirin EC 81 MG tablet  . bimatoprost (LUMIGAN) 0.01 % SOLN  . brimonidine (ALPHAGAN) 0.15 % ophthalmic solution  . Cholecalciferol (VITAMIN D) 50 MCG (2000 UT) CAPS  .  docusate sodium (COLACE) 100 MG capsule  . dorzolamide (TRUSOPT) 2 % ophthalmic solution  . fenofibrate 160 MG tablet  . fluticasone furoate-vilanterol (BREO ELLIPTA) 200-25 MCG/INH AEPB  . Garlic 123XX123 MG CAPS  . HYDROcodone-acetaminophen (NORCO) 10-325 MG tablet  . hyoscyamine (LEVSIN SL) 0.125 MG SL tablet  . Multiple Vitamin (MULTIVITAMIN WITH MINERALS) TABS tablet  . Multiple Vitamins-Minerals (ICAPS AREDS 2 PO)  . nitroGLYCERIN (NITROSTAT) 0.4 MG SL tablet  . Omega-3 Fatty Acids (FISH OIL) 1200 MG CAPS  . polyethylene glycol-electrolytes (NULYTELY) 420 g solution  . PROAIR HFA 108 (90 Base) MCG/ACT inhaler  . Probiotic Product (ALIGN) 4 MG CAPS  . psyllium (  METAMUCIL) 58.6 % packet  . rosuvastatin (CRESTOR) 10 MG tablet  . tamsulosin (FLOMAX) 0.4 MG CAPS capsule  . tiotropium (SPIRIVA) 18 MCG inhalation capsule   No current facility-administered medications for this encounter.    Myra Gianotti, PA-C Surgical Short Stay/Anesthesiology [facility] Phone [phone] [facility] Phone [phone] 03/10/2019 11:30 AM

## 2019-03-10 NOTE — Anesthesia Preprocedure Evaluation (Addendum)
Anesthesia Evaluation  Patient identified by MRN, date of birth, ID band Patient awake    Reviewed: Allergy & Precautions, NPO status , Patient's Chart, lab work & pertinent test results, reviewed documented beta blocker date and time   Airway Mallampati: II  TM Distance: >3 FB Neck ROM: Full    Dental  (+) Upper Dentures, Dental Advisory Given, Partial Lower   Pulmonary shortness of breath and with exertion, pneumonia, resolved, COPD,  COPD inhaler, former smoker,  Bronchiectasis Multiple pulmonary nodules   Pulmonary exam normal breath sounds clear to auscultation       Cardiovascular hypertension, Pt. on medications and Pt. on home beta blockers + CAD  Normal cardiovascular exam+ dysrhythmias  Rhythm:Regular Rate:Normal  1st deg AV block Thoracic Aortic aneurysm- followed by Dr. Ulyess Blossom  Obstructive lesion L PDA managed medically   Neuro/Psych Open angle glaucoma OD Macular degeneration  Neuromuscular disease negative psych ROS   GI/Hepatic Neg liver ROS, Hx/ diverticulosis and colon polyps   Endo/Other  Hyperlipidemia  Renal/GU Renal diseaseHx/o renal calculi   Hx/o prostate Ca S/P radioactive seeds    Musculoskeletal  (+) Arthritis , Osteoarthritis,  Cervical spondylotic arthropathy   Abdominal   Peds  Hematology negative hematology ROS (+)   Anesthesia Other Findings   Reproductive/Obstetrics                         Anesthesia Physical Anesthesia Plan  ASA: III  Anesthesia Plan: General   Post-op Pain Management:    Induction: Intravenous  PONV Risk Score and Plan: 3 and Ondansetron, Dexamethasone and Treatment may vary due to age or medical condition  Airway Management Planned: Oral ETT  Additional Equipment:   Intra-op Plan:   Post-operative Plan: Extubation in OR  Informed Consent: I have reviewed the patients History and Physical, chart, labs and discussed the  procedure including the risks, benefits and alternatives for the proposed anesthesia with the patient or authorized representative who has indicated his/her understanding and acceptance.     Dental advisory given  Plan Discussed with: CRNA and Surgeon  Anesthesia Plan Comments: (PAT note written 03/10/2019 by Myra Gianotti, PA-C. )      Anesthesia Quick Evaluation

## 2019-03-11 ENCOUNTER — Other Ambulatory Visit (HOSPITAL_COMMUNITY)
Admission: RE | Admit: 2019-03-11 | Discharge: 2019-03-11 | Disposition: A | Discharge status: Home / Self Care | Payer: PPO | Source: Ambulatory Visit | Attending: Orthopedic Surgery | Admitting: Orthopedic Surgery

## 2019-03-11 DIAGNOSIS — Z20822 Contact with and (suspected) exposure to covid-19: Secondary | ICD-10-CM | POA: Insufficient documentation

## 2019-03-11 DIAGNOSIS — Z01812 Encounter for preprocedural laboratory examination: Secondary | ICD-10-CM | POA: Insufficient documentation

## 2019-03-11 LAB — SARS CORONAVIRUS 2 (TAT 6-24 HRS): SARS Coronavirus 2: NEGATIVE

## 2019-03-14 ENCOUNTER — Encounter (HOSPITAL_COMMUNITY): Payer: Self-pay | Admitting: Orthopedic Surgery

## 2019-03-15 ENCOUNTER — Ambulatory Visit (HOSPITAL_COMMUNITY)
Admission: RE | Disposition: A | Discharge status: Home / Self Care | Payer: Self-pay | Source: Home / Self Care | Attending: Orthopedic Surgery

## 2019-03-15 ENCOUNTER — Ambulatory Visit (HOSPITAL_COMMUNITY): Payer: PPO | Admitting: Anesthesiology

## 2019-03-15 ENCOUNTER — Ambulatory Visit (HOSPITAL_COMMUNITY): Payer: PPO | Admitting: Vascular Surgery

## 2019-03-15 ENCOUNTER — Encounter (HOSPITAL_COMMUNITY): Payer: Self-pay | Admitting: Orthopedic Surgery

## 2019-03-15 ENCOUNTER — Ambulatory Visit (HOSPITAL_COMMUNITY): Payer: PPO

## 2019-03-15 ENCOUNTER — Observation Stay (HOSPITAL_COMMUNITY)
Admission: RE | Admit: 2019-03-15 | Discharge: 2019-03-16 | Disposition: A | Discharge status: Home / Self Care | Payer: PPO | Attending: Orthopedic Surgery | Admitting: Orthopedic Surgery

## 2019-03-15 ENCOUNTER — Other Ambulatory Visit: Payer: Self-pay

## 2019-03-15 DIAGNOSIS — I712 Thoracic aortic aneurysm, without rupture: Secondary | ICD-10-CM | POA: Insufficient documentation

## 2019-03-15 DIAGNOSIS — Z85828 Personal history of other malignant neoplasm of skin: Secondary | ICD-10-CM | POA: Diagnosis not present

## 2019-03-15 DIAGNOSIS — M5011 Cervical disc disorder with radiculopathy,  high cervical region: Principal | ICD-10-CM | POA: Insufficient documentation

## 2019-03-15 DIAGNOSIS — I129 Hypertensive chronic kidney disease with stage 1 through stage 4 chronic kidney disease, or unspecified chronic kidney disease: Secondary | ICD-10-CM | POA: Insufficient documentation

## 2019-03-15 DIAGNOSIS — Z79899 Other long term (current) drug therapy: Secondary | ICD-10-CM | POA: Insufficient documentation

## 2019-03-15 DIAGNOSIS — M5412 Radiculopathy, cervical region: Secondary | ICD-10-CM | POA: Diagnosis present

## 2019-03-15 DIAGNOSIS — Z7982 Long term (current) use of aspirin: Secondary | ICD-10-CM | POA: Insufficient documentation

## 2019-03-15 DIAGNOSIS — N183 Chronic kidney disease, stage 3 unspecified: Secondary | ICD-10-CM | POA: Diagnosis not present

## 2019-03-15 DIAGNOSIS — E785 Hyperlipidemia, unspecified: Secondary | ICD-10-CM | POA: Insufficient documentation

## 2019-03-15 DIAGNOSIS — J432 Centrilobular emphysema: Secondary | ICD-10-CM | POA: Diagnosis not present

## 2019-03-15 DIAGNOSIS — M4322 Fusion of spine, cervical region: Secondary | ICD-10-CM | POA: Diagnosis not present

## 2019-03-15 DIAGNOSIS — Z923 Personal history of irradiation: Secondary | ICD-10-CM | POA: Insufficient documentation

## 2019-03-15 DIAGNOSIS — H4010X0 Unspecified open-angle glaucoma, stage unspecified: Secondary | ICD-10-CM | POA: Diagnosis not present

## 2019-03-15 DIAGNOSIS — I2510 Atherosclerotic heart disease of native coronary artery without angina pectoris: Secondary | ICD-10-CM | POA: Insufficient documentation

## 2019-03-15 DIAGNOSIS — Z419 Encounter for procedure for purposes other than remedying health state, unspecified: Secondary | ICD-10-CM

## 2019-03-15 DIAGNOSIS — Z87891 Personal history of nicotine dependence: Secondary | ICD-10-CM | POA: Insufficient documentation

## 2019-03-15 DIAGNOSIS — M4722 Other spondylosis with radiculopathy, cervical region: Secondary | ICD-10-CM | POA: Diagnosis not present

## 2019-03-15 DIAGNOSIS — Z8546 Personal history of malignant neoplasm of prostate: Secondary | ICD-10-CM | POA: Insufficient documentation

## 2019-03-15 HISTORY — PX: ANTERIOR CERVICAL DECOMP/DISCECTOMY FUSION: SHX1161

## 2019-03-15 SURGERY — ANTERIOR CERVICAL DECOMPRESSION/DISCECTOMY FUSION 2 LEVELS
Anesthesia: General | Site: Spine Cervical

## 2019-03-15 MED ORDER — DOCUSATE SODIUM 100 MG PO CAPS
100.0000 mg | ORAL_CAPSULE | Freq: Two times a day (BID) | ORAL | Status: DC
  Administered 2019-03-15: 100 mg via ORAL
  Filled 2019-03-15: qty 1

## 2019-03-15 MED ORDER — MENTHOL 3 MG MT LOZG: 1.0000 | LOZENGE | OROMUCOSAL | Status: DC | PRN

## 2019-03-15 MED ORDER — ONDANSETRON HCL 4 MG PO TABS: 4.0000 mg | ORAL_TABLET | Freq: Three times a day (TID) | ORAL | 0 refills | Status: AC | PRN

## 2019-03-15 MED ORDER — METHOCARBAMOL 500 MG PO TABS: 500.0000 mg | ORAL_TABLET | Freq: Three times a day (TID) | ORAL | 0 refills | Status: AC | PRN

## 2019-03-15 MED ORDER — ALFUZOSIN HCL ER 10 MG PO TB24
10.0000 mg | ORAL_TABLET | Freq: Every day | ORAL | Status: DC
  Filled 2019-03-15 (×2): qty 1

## 2019-03-15 MED ORDER — ACETAMINOPHEN 10 MG/ML IV SOLN
1000.0000 mg | Freq: Once | INTRAVENOUS | Status: AC
  Administered 2019-03-15: 12:00:00 1000 mg via INTRAVENOUS
  Filled 2019-03-15: qty 100

## 2019-03-15 MED ORDER — FLUTICASONE FUROATE-VILANTEROL 200-25 MCG/INH IN AEPB: 1.0000 | INHALATION_SPRAY | Freq: Every day | RESPIRATORY_TRACT | Status: DC | PRN

## 2019-03-15 MED ORDER — MEPERIDINE HCL 25 MG/ML IJ SOLN: 6.2500 mg | INTRAMUSCULAR | Status: DC | PRN

## 2019-03-15 MED ORDER — FENTANYL CITRATE (PF) 100 MCG/2ML IJ SOLN
INTRAMUSCULAR | Status: DC | PRN
  Administered 2019-03-15 (×5): 50 ug via INTRAVENOUS

## 2019-03-15 MED ORDER — HYDROMORPHONE HCL 1 MG/ML IJ SOLN
INTRAMUSCULAR | Status: AC
  Filled 2019-03-15: qty 1

## 2019-03-15 MED ORDER — LIDOCAINE 2% (20 MG/ML) 5 ML SYRINGE
INTRAMUSCULAR | Status: DC | PRN
  Administered 2019-03-15: 40 mg via INTRAVENOUS

## 2019-03-15 MED ORDER — EPINEPHRINE PF 1 MG/ML IJ SOLN
INTRAMUSCULAR | Status: DC | PRN
  Administered 2019-03-15: .15 mL

## 2019-03-15 MED ORDER — LACTATED RINGERS IV SOLN: INTRAVENOUS | Status: DC

## 2019-03-15 MED ORDER — GABAPENTIN 300 MG PO CAPS
300.0000 mg | ORAL_CAPSULE | Freq: Three times a day (TID) | ORAL | Status: DC
  Administered 2019-03-15: 300 mg via ORAL
  Filled 2019-03-15 (×2): qty 1

## 2019-03-15 MED ORDER — PROPOFOL 10 MG/ML IV BOLUS
INTRAVENOUS | Status: AC
  Filled 2019-03-15: qty 20

## 2019-03-15 MED ORDER — DEXAMETHASONE SODIUM PHOSPHATE 10 MG/ML IJ SOLN
INTRAMUSCULAR | Status: AC
  Filled 2019-03-15: qty 1

## 2019-03-15 MED ORDER — SUGAMMADEX SODIUM 200 MG/2ML IV SOLN
INTRAVENOUS | Status: DC | PRN
  Administered 2019-03-15: 200 mg via INTRAVENOUS

## 2019-03-15 MED ORDER — ROCURONIUM BROMIDE 10 MG/ML (PF) SYRINGE
PREFILLED_SYRINGE | INTRAVENOUS | Status: AC
  Filled 2019-03-15: qty 10

## 2019-03-15 MED ORDER — ONDANSETRON HCL 4 MG PO TABS: 4.0000 mg | ORAL_TABLET | Freq: Four times a day (QID) | ORAL | Status: DC | PRN

## 2019-03-15 MED ORDER — METHOCARBAMOL 500 MG PO TABS
ORAL_TABLET | ORAL | Status: AC
  Filled 2019-03-15: qty 1

## 2019-03-15 MED ORDER — 0.9 % SODIUM CHLORIDE (POUR BTL) OPTIME
TOPICAL | Status: DC | PRN
  Administered 2019-03-15: 1000 mL

## 2019-03-15 MED ORDER — MIDAZOLAM HCL 2 MG/2ML IJ SOLN
INTRAMUSCULAR | Status: AC
  Filled 2019-03-15: qty 2

## 2019-03-15 MED ORDER — HYDROCODONE-ACETAMINOPHEN 10-325 MG PO TABS
1.0000 | ORAL_TABLET | ORAL | Status: DC | PRN
  Administered 2019-03-15 – 2019-03-16 (×2): 1 via ORAL
  Filled 2019-03-15 (×3): qty 1

## 2019-03-15 MED ORDER — HYDROCODONE-ACETAMINOPHEN 10-325 MG PO TABS: 1.0000 | ORAL_TABLET | Freq: Four times a day (QID) | ORAL | 0 refills | Status: AC | PRN

## 2019-03-15 MED ORDER — HYDROMORPHONE HCL 1 MG/ML IJ SOLN
0.2500 mg | INTRAMUSCULAR | Status: DC | PRN
  Administered 2019-03-15 (×3): 0.5 mg via INTRAVENOUS

## 2019-03-15 MED ORDER — PHENYLEPHRINE HCL-NACL 10-0.9 MG/250ML-% IV SOLN
INTRAVENOUS | Status: DC | PRN
  Administered 2019-03-15: 40 ug/min via INTRAVENOUS

## 2019-03-15 MED ORDER — ONDANSETRON HCL 4 MG/2ML IJ SOLN: 4.0000 mg | Freq: Once | INTRAMUSCULAR | Status: DC | PRN

## 2019-03-15 MED ORDER — ONDANSETRON HCL 4 MG/2ML IJ SOLN
INTRAMUSCULAR | Status: DC | PRN
  Administered 2019-03-15: 4 mg via INTRAVENOUS

## 2019-03-15 MED ORDER — PROPOFOL 10 MG/ML IV BOLUS
INTRAVENOUS | Status: DC | PRN
  Administered 2019-03-15: 160 mg via INTRAVENOUS

## 2019-03-15 MED ORDER — HEMOSTATIC AGENTS (NO CHARGE) OPTIME
TOPICAL | Status: DC | PRN
  Administered 2019-03-15: 1

## 2019-03-15 MED ORDER — ONDANSETRON HCL 4 MG/2ML IJ SOLN
INTRAMUSCULAR | Status: AC
  Filled 2019-03-15: qty 2

## 2019-03-15 MED ORDER — ACETAMINOPHEN 10 MG/ML IV SOLN
INTRAVENOUS | Status: AC
  Filled 2019-03-15: qty 100

## 2019-03-15 MED ORDER — SODIUM CHLORIDE 0.9% FLUSH: 3.0000 mL | INTRAVENOUS | Status: DC | PRN

## 2019-03-15 MED ORDER — BUPIVACAINE HCL (PF) 0.25 % IJ SOLN
INTRAMUSCULAR | Status: AC
  Filled 2019-03-15: qty 30

## 2019-03-15 MED ORDER — EPINEPHRINE PF 1 MG/ML IJ SOLN
INTRAMUSCULAR | Status: AC
  Filled 2019-03-15: qty 1

## 2019-03-15 MED ORDER — LIDOCAINE 2% (20 MG/ML) 5 ML SYRINGE
INTRAMUSCULAR | Status: AC
  Filled 2019-03-15: qty 5

## 2019-03-15 MED ORDER — SODIUM CHLORIDE 0.9% FLUSH: 3.0000 mL | Freq: Two times a day (BID) | INTRAVENOUS | Status: DC

## 2019-03-15 MED ORDER — DEXAMETHASONE SODIUM PHOSPHATE 10 MG/ML IJ SOLN
INTRAMUSCULAR | Status: DC | PRN
  Administered 2019-03-15: 10 mg via INTRAVENOUS

## 2019-03-15 MED ORDER — ONDANSETRON HCL 4 MG/2ML IJ SOLN: 4.0000 mg | Freq: Four times a day (QID) | INTRAMUSCULAR | Status: DC | PRN

## 2019-03-15 MED ORDER — CEFAZOLIN SODIUM-DEXTROSE 1-4 GM/50ML-% IV SOLN
1.0000 g | Freq: Three times a day (TID) | INTRAVENOUS | Status: AC
  Administered 2019-03-15 – 2019-03-16 (×2): 1 g via INTRAVENOUS
  Filled 2019-03-15 (×2): qty 50

## 2019-03-15 MED ORDER — FENTANYL CITRATE (PF) 250 MCG/5ML IJ SOLN
INTRAMUSCULAR | Status: AC
  Filled 2019-03-15: qty 5

## 2019-03-15 MED ORDER — THROMBIN (RECOMBINANT) 20000 UNITS EX SOLR
CUTANEOUS | Status: AC
  Filled 2019-03-15: qty 20000

## 2019-03-15 MED ORDER — ROCURONIUM BROMIDE 10 MG/ML (PF) SYRINGE
PREFILLED_SYRINGE | INTRAVENOUS | Status: DC | PRN
  Administered 2019-03-15 (×3): 20 mg via INTRAVENOUS
  Administered 2019-03-15: 50 mg via INTRAVENOUS
  Administered 2019-03-15: 30 mg via INTRAVENOUS

## 2019-03-15 MED ORDER — PHENOL 1.4 % MT LIQD: 1.0000 | OROMUCOSAL | Status: DC | PRN

## 2019-03-15 MED ORDER — ALBUTEROL SULFATE (2.5 MG/3ML) 0.083% IN NEBU: 3.0000 mL | INHALATION_SOLUTION | Freq: Four times a day (QID) | RESPIRATORY_TRACT | Status: DC | PRN

## 2019-03-15 MED ORDER — NITROGLYCERIN 0.4 MG SL SUBL: 0.4000 mg | SUBLINGUAL_TABLET | SUBLINGUAL | Status: DC | PRN

## 2019-03-15 MED ORDER — ACETAMINOPHEN 650 MG RE SUPP: 650.0000 mg | RECTAL | Status: DC | PRN

## 2019-03-15 MED ORDER — THROMBIN 20000 UNITS EX SOLR: CUTANEOUS | Status: DC | PRN

## 2019-03-15 MED ORDER — MORPHINE SULFATE (PF) 2 MG/ML IV SOLN: 1.0000 mg | INTRAVENOUS | Status: DC | PRN

## 2019-03-15 MED ORDER — ACETAMINOPHEN 325 MG PO TABS
650.0000 mg | ORAL_TABLET | ORAL | Status: DC | PRN
  Filled 2019-03-15: qty 2

## 2019-03-15 MED ORDER — BUPIVACAINE HCL (PF) 0.25 % IJ SOLN
INTRAMUSCULAR | Status: DC | PRN
  Administered 2019-03-15: 9 mL

## 2019-03-15 MED ORDER — CEFAZOLIN SODIUM-DEXTROSE 2-4 GM/100ML-% IV SOLN
INTRAVENOUS | Status: AC
  Filled 2019-03-15: qty 100

## 2019-03-15 MED ORDER — METHOCARBAMOL 1000 MG/10ML IJ SOLN
500.0000 mg | Freq: Four times a day (QID) | INTRAVENOUS | Status: DC | PRN
  Filled 2019-03-15: qty 5

## 2019-03-15 MED ORDER — CEFAZOLIN SODIUM-DEXTROSE 2-4 GM/100ML-% IV SOLN
2.0000 g | INTRAVENOUS | Status: AC
  Administered 2019-03-15: 09:00:00 2 g via INTRAVENOUS

## 2019-03-15 MED ORDER — METHOCARBAMOL 500 MG PO TABS
500.0000 mg | ORAL_TABLET | Freq: Four times a day (QID) | ORAL | Status: DC | PRN
  Administered 2019-03-15 – 2019-03-16 (×3): 500 mg via ORAL
  Filled 2019-03-15 (×2): qty 1

## 2019-03-15 MED ORDER — TIOTROPIUM BROMIDE MONOHYDRATE 18 MCG IN CAPS: 18.0000 ug | ORAL_CAPSULE | Freq: Every day | RESPIRATORY_TRACT | Status: DC

## 2019-03-15 SURGICAL SUPPLY — 61 items
AGENT HMST KT MTR STRL THRMB (HEMOSTASIS) ×1
BONE VIVIGEN FORMABLE 1.3CC (Bone Implant) ×6 IMPLANT
CABLE BIPOLOR RESECTION CORD (MISCELLANEOUS) ×3 IMPLANT
CAGE CERV MOD 7X17X14 7D (Cage) ×6 IMPLANT
CANISTER SUCT 3000ML PPV (MISCELLANEOUS) ×3 IMPLANT
CLOSURE STERI-STRIP 1/2X4 (GAUZE/BANDAGES/DRESSINGS) ×1
CLSR STERI-STRIP ANTIMIC 1/2X4 (GAUZE/BANDAGES/DRESSINGS) ×2 IMPLANT
COVER MAYO STAND STRL (DRAPES) ×6 IMPLANT
COVER SURGICAL LIGHT HANDLE (MISCELLANEOUS) ×3 IMPLANT
COVER WAND RF STERILE (DRAPES) ×3 IMPLANT
DRAPE C-ARM 42X72 X-RAY (DRAPES) ×3 IMPLANT
DRAPE POUCH INSTRU U-SHP 10X18 (DRAPES) ×3 IMPLANT
DRAPE SURG 17X23 STRL (DRAPES) ×3 IMPLANT
DRAPE U-SHAPE 47X51 STRL (DRAPES) ×3 IMPLANT
DRSG OPSITE POSTOP 3X4 (GAUZE/BANDAGES/DRESSINGS) ×3 IMPLANT
DRSG OPSITE POSTOP 4X6 (GAUZE/BANDAGES/DRESSINGS) ×3 IMPLANT
DURAPREP 26ML APPLICATOR (WOUND CARE) ×3 IMPLANT
ELECT COATED BLADE 2.86 ST (ELECTRODE) ×3 IMPLANT
ELECT PENCIL ROCKER SW 15FT (MISCELLANEOUS) ×3 IMPLANT
ELECT REM PT RETURN 9FT ADLT (ELECTROSURGICAL) ×3
ELECTRODE REM PT RTRN 9FT ADLT (ELECTROSURGICAL) ×1 IMPLANT
GLOVE BIO SURGEON STRL SZ 6.5 (GLOVE) ×2 IMPLANT
GLOVE BIO SURGEONS STRL SZ 6.5 (GLOVE) ×1
GLOVE BIOGEL PI IND STRL 6.5 (GLOVE) ×1 IMPLANT
GLOVE BIOGEL PI IND STRL 8.5 (GLOVE) ×1 IMPLANT
GLOVE BIOGEL PI INDICATOR 6.5 (GLOVE) ×2
GLOVE BIOGEL PI INDICATOR 8.5 (GLOVE) ×2
GLOVE SS BIOGEL STRL SZ 8.5 (GLOVE) ×1 IMPLANT
GLOVE SUPERSENSE BIOGEL SZ 8.5 (GLOVE) ×2
GOWN STRL REUS W/ TWL LRG LVL3 (GOWN DISPOSABLE) ×1 IMPLANT
GOWN STRL REUS W/TWL 2XL LVL3 (GOWN DISPOSABLE) ×3 IMPLANT
GOWN STRL REUS W/TWL LRG LVL3 (GOWN DISPOSABLE) ×3
KIT BASIN OR (CUSTOM PROCEDURE TRAY) ×3 IMPLANT
KIT TURNOVER KIT B (KITS) ×3 IMPLANT
NEEDLE HYPO 22GX1.5 SAFETY (NEEDLE) ×3 IMPLANT
NEEDLE SPNL 18GX3.5 QUINCKE PK (NEEDLE) ×3 IMPLANT
NS IRRIG 1000ML POUR BTL (IV SOLUTION) ×3 IMPLANT
PACK ORTHO CERVICAL (CUSTOM PROCEDURE TRAY) ×3 IMPLANT
PACK UNIVERSAL I (CUSTOM PROCEDURE TRAY) ×3 IMPLANT
PATTIES SURGICAL .25X.25 (GAUZE/BANDAGES/DRESSINGS) ×3 IMPLANT
PLATE ACP 1.6X42 2LVL (Plate) ×3 IMPLANT
POSITIONER HEAD DONUT 9IN (MISCELLANEOUS) ×3 IMPLANT
RESTRAINT LIMB HOLDER UNIV (RESTRAINTS) ×3 IMPLANT
SCREW ACP 3.5 X 13 S/D VARIA (Screw) ×6 IMPLANT
SCREW ACP 3.5X13 S/D VAR ANGLE (Screw) ×2 IMPLANT
SCREW ACP VA SD 3.5X15 (Screw) ×12 IMPLANT
SPONGE INTESTINAL PEANUT (DISPOSABLE) ×3 IMPLANT
SPONGE LAP 4X18 RFD (DISPOSABLE) ×6 IMPLANT
SPONGE SURGIFOAM ABS GEL 100 (HEMOSTASIS) ×3 IMPLANT
SURGIFLO W/THROMBIN 8M KIT (HEMOSTASIS) ×3 IMPLANT
SUT BONE WAX W31G (SUTURE) ×3 IMPLANT
SUT MON AB 3-0 SH 27 (SUTURE) ×3
SUT MON AB 3-0 SH27 (SUTURE) ×1 IMPLANT
SUT VIC AB 2-0 CT1 18 (SUTURE) ×3 IMPLANT
SYR BULB IRRIGATION 50ML (SYRINGE) ×3 IMPLANT
SYR CONTROL 10ML LL (SYRINGE) ×3 IMPLANT
TAPE CLOTH 4X10 WHT NS (GAUZE/BANDAGES/DRESSINGS) ×3 IMPLANT
TAPE UMBILICAL COTTON 1/8X30 (MISCELLANEOUS) ×3 IMPLANT
TOWEL GREEN STERILE (TOWEL DISPOSABLE) ×3 IMPLANT
TOWEL GREEN STERILE FF (TOWEL DISPOSABLE) ×3 IMPLANT
WATER STERILE IRR 1000ML POUR (IV SOLUTION) ×3 IMPLANT

## 2019-03-15 NOTE — H&P (Signed)
Addendum H&P  There is been no change in the patient's clinical exam since his last office visit of 03/07/2019.  He continues to have significant neck and radicular right arm pain.  Clinical exam consistent with cervical spondylitic radiculopathy secondary to C6 and C7 nerve irritation.  Patient demonstrates both motor and sensory deficits but no evidence of myelopathy.  Surgical plan: Two-level ACDF to address the spondylitic radiculopathy C5-6 and C6-7.  I have gone over the surgical plan as well as the risks and benefits with the patient and all of his questions were encouraged and addressed.  He expressed a desire to move forward with surgery.

## 2019-03-15 NOTE — Anesthesia Procedure Notes (Signed)
Procedure Name: Intubation Date/Time: 03/15/2019 8:42 AM Performed by: Jenne Campus, CRNA Pre-anesthesia Checklist: Patient identified, Emergency Drugs available, Suction available and Patient being monitored Patient Re-evaluated:Patient Re-evaluated prior to induction Oxygen Delivery Method: Circle System Utilized Preoxygenation: Pre-oxygenation with 100% oxygen Induction Type: IV induction Ventilation: Mask ventilation without difficulty Laryngoscope Size: Glidescope and 4 Grade View: Grade I Tube type: Oral Tube size: 7.5 mm Number of attempts: 1 Airway Equipment and Method: Stylet and Oral airway Placement Confirmation: ETT inserted through vocal cords under direct vision,  positive ETCO2 and breath sounds checked- equal and bilateral Secured at: 21 cm Tube secured with: Tape Dental Injury: Teeth and Oropharynx as per pre-operative assessment

## 2019-03-15 NOTE — Discharge Instructions (Signed)

## 2019-03-15 NOTE — Op Note (Signed)
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  Postoperative diagnosis: Same  Operative procedure: Anterior cervical discectomy and fusion C5-7  First Assistant: Cleta Alberts, PA  Complications: None  Implants: NuVasive 3D implant to the intervertebral cage with NuVasive anterior cervical plate  Allograft: vivogen  Indications: David Cantrell is a very pleasant 79 year old gentleman who presents with acute onset of severe radicular right arm pain.  Clinical and imaging studies confirmed posterior lateral to right disc herniation at C5-6 with C6 nerve compression and hard disc osteophyte C6-7 with degenerative disc disease C7 radicular pain.  Attempts at conservative management had failed to alleviate his symptoms.  Because of his neurological deficits as well as severe pain we elected to move forward with surgery.  All operative risks benefits and alternatives to surgery were discussed with the patient and consent was obtained.  Operative report: Patient was brought the operating room placed upon the operating table.  After successful induction of general anesthesia endotracheal intubation  - teds SCDs were applied and the anterior cervical spine was prepped and draped in a standard fashion.  Timeout was taken to confirm patient procedure and all other important data.  Preoperative fluoroscopy view confirmed the C6 vertebral body and I marked out the skin incision.  I infiltrated this with quarter percent Marcaine with epinephrine and then made a transverse incision starting at the midline and proceeding to the left.  I proceeded to dissect down to the platysma.  I isolated and transected the platysma to perform a standard Smith-Robinson approach to the anterior cervical spine.  I continued sharply dissecting along the medial border the sternocleidomastoid until I was able to develop a plane through the deep  cervical and prevertebral fascia and expose the anterior longitudinal ligament.  The omohyoid was identified but was not felt to be entering visualization so I did not sacrifice it.  Using Kitner dissectors I completely exposed the C5-6 and C6-7 disc space levels.  Using bipolar cautery I mobilized the longus coli muscles from the superior aspect of C5 to the inferior aspect of C7 out to the level of the uncovertebral joints.  Needle was placed into the C5-6 disc space and an intraoperative lateral fluoroscopy was taken confirming that I was at the appropriate level.  At this point with the levels confirmed I then proceeded with the discectomy.  Caspar retracting blades were placed underneath the longus coli muscle and the retractor blades were expanded.  The endotracheal cuff was deflated and reinflated after the retractors were at the appropriate width.  An annulotomy at C6-7 was performed with a 15 blade scalpel and then I used pituitary rongeurs to remove bulk of the disc material.  I then remove the overhanging osteophyte from the inferior aspect of the C6 vertebral body with a tooth millimeter Kerrison rongeur.  I continue to remove the bulk of the disc material using curettes.  Distraction pins were placed into the body of C6 and C7 and I gently distracted the intervertebral space with a lamina spreader and maintain the distraction with the distraction pin set.  This allowed me to continue to remove the remaining disc material and expose the posterior annulus.  Using a fine nerve hook I gently dissected through the posterior annulus and posterior longitudinal ligament to develop a plane between the thecal sac and the PLL.  Using a 1 mm Kerrison rongeur I resected the annulus and PLL and also took down the posterior  osteophyte from the vertebral bodies.  I was also able to pass my Kerrison rongeur under the uncovertebral joint and completely decompress the foramen especially on the right side.  At this point  once I had an adequate decompression and discectomy I proceeded with the fusion.  Using a rasp trial I elected to use the size 7 mm large NuVasive intervertebral 3D implant.  The implant was packed with the allograft and then malleted to the appropriate depth.  I confirmed satisfactory position with fluoroscopy.  At this point I repositioned the distraction pin from C7-C5 and then relocated the Caspar retractors to expose the C5-6 disc space.  Using the same technique that I used with C6-7 I performed a discectomy.  I again trimmed down the overhanging osteophyte from the inferior aspect of C5 and removed all the disc material.  At this level there was a soft disc herniation noted posterior lateral to the right exiting C6 nerve compression.  This was consistent with what was seen on the preoperative MRI.  Using a fine nerve hook I was able to mobilize and remove the disc fragment.  This allowed me to develop a plane under the remaining portion of the PLL and resected with a 1 mm Kerrison rongeur.  I was also able to undercut the uncovertebral joint to indirectly further decompress the nerve root.  At this point I had complete discectomy and removal of the disc fragment and decompression of the nerve root.  I then rasped and trialed the intervertebral spacers and elected use the same 7 mm large NuVasive intervertebral 3D implant.  It was packed with the allograft and malleted to the appropriate depth.  At this point the distraction pins were removed and the bleeding bone edges were sealed with bone wax.  Wound was copiously irrigated with normal saline and then I placed some of the remaining bone graft anterior to the cages to facilitate a sentinel fusion.  The anterior cervical NuVasive plate was contoured and secured to the vertebral body of C5 and C7 with 15 mm length screws.  I secured the plate to the C6 vertebral body with 13 mm screws.  All 6 screws had excellent purchase.  At this point using FloSeal I  obtained hemostasis.  After copious irrigation I checked to ensure the esophagus was not inadvertently entrapped underneath the plate.  Once this was confirmed I returned the trachea and esophagus to midline and then reapproximated the platysma with 2-0 Vicryl sutures.  Skin was closed with 3-0 Monocryl.  At the end of the case all needle sponge counts were correct.  Final x-rays were satisfactory with no complicating features.  Both cages were well seated and all 6 screws had excellent purchase.  Patient was ultimately extubated transfer the PACU without incident.  The end of the case all needle sponge counts were correct.

## 2019-03-15 NOTE — Transfer of Care (Signed)
Immediate Anesthesia Transfer of Care Note  Patient: Deontrae Folino Arvie  Procedure(s) Performed: ANTERIOR CERVICAL DECOMPRESSION/DISCECTOMY FUSION CERVICAL FIVE-CERVICAL SIX, CERVICAL SIX- CERVICAL SEVEN (N/A Spine Cervical)  Patient Location: PACU  Anesthesia Type:General  Level of Consciousness: awake, oriented, drowsy and patient cooperative  Airway & Oxygen Therapy: Patient Spontanous Breathing and Patient connected to nasal cannula oxygen  Post-op Assessment: Report given to RN and Post -op Vital signs reviewed and stable  Post vital signs: Reviewed  Last Vitals:  Vitals Value Taken Time  BP 131/70 03/15/19 1247  Temp    Pulse 74 03/15/19 1249  Resp 16 03/15/19 1249  SpO2 97 % 03/15/19 1249  Vitals shown include unvalidated device data.  Last Pain:  Vitals:   03/15/19 0705  TempSrc:   PainSc: 6          Complications: No apparent anesthesia complications

## 2019-03-15 NOTE — Anesthesia Postprocedure Evaluation (Signed)
Anesthesia Post Note  Patient: David Cantrell  Procedure(s) Performed: ANTERIOR CERVICAL DECOMPRESSION/DISCECTOMY FUSION CERVICAL FIVE-CERVICAL SIX, CERVICAL SIX- CERVICAL SEVEN (N/A Spine Cervical)     Patient location during evaluation: PACU Anesthesia Type: General Level of consciousness: awake and alert and oriented Pain management: pain level controlled Vital Signs Assessment: post-procedure vital signs reviewed and stable Respiratory status: spontaneous breathing, nonlabored ventilation, respiratory function stable and patient connected to nasal cannula oxygen Cardiovascular status: blood pressure returned to baseline and stable Postop Assessment: no apparent nausea or vomiting Anesthetic complications: no    Last Vitals:  Vitals:   03/15/19 1247 03/15/19 1302  BP:  133/77  Pulse:  72  Resp:  12  Temp: 36.6 C   SpO2:  98%    Last Pain:  Vitals:   03/15/19 0705  TempSrc:   PainSc: 6                  Selim Durden A.

## 2019-03-15 NOTE — Brief Op Note (Signed)
03/15/2019  12:51 PM  PATIENT:  David Cantrell  79 y.o. male  PRE-OPERATIVE DIAGNOSIS:  Cervical spondylotic radiculopathy  POST-OPERATIVE DIAGNOSIS:  Cervical spondylotic radiculopathy  PROCEDURE:  Procedure(s) with comments: ANTERIOR CERVICAL DECOMPRESSION/DISCECTOMY FUSION CERVICAL FIVE-CERVICAL SIX, CERVICAL SIX- CERVICAL SEVEN (N/A) - 4 hrs  SURGEON:  Surgeon(s) and Role:    Melina Schools, MD - Primary  PHYSICIAN ASSISTANT:   ASSISTANTS: general   ANESTHESIA:   general  EBL:  50 mL   BLOOD ADMINISTERED:none  DRAINS: none   LOCAL MEDICATIONS USED:  MARCAINE     SPECIMEN:  No Specimen  DISPOSITION OF SPECIMEN:  N/A  COUNTS:  YES  TOURNIQUET:  * No tourniquets in log *  DICTATION: .Dragon Dictation  PLAN OF CARE: Admit for overnight observation  PATIENT DISPOSITION:  PACU - hemodynamically stable.

## 2019-03-16 ENCOUNTER — Encounter: Payer: Self-pay | Admitting: *Deleted

## 2019-03-16 DIAGNOSIS — M5011 Cervical disc disorder with radiculopathy,  high cervical region: Secondary | ICD-10-CM | POA: Diagnosis not present

## 2019-03-16 MED FILL — Thrombin (Recombinant) For Soln 20000 Unit: CUTANEOUS | Qty: 1 | Status: AC

## 2019-03-16 NOTE — Progress Notes (Signed)
Occupational Therapy Evaluation Patient Details Name: David Cantrell MRN: [id number] DOB: [birth year] Today's Date: 03/16/2019    History of Present Illness 79 yo s/p ANTERIOR CERVICAL DECOMPRESSION/DISCECTOMY FUSION CERVICAL FIVE-CERVICAL SIX, CERVICAL SIX- CERVICAL SEVEN    Clinical Impression   Completed all education regarding cervical precautions and mangaement of ADL.    Follow Up Recommendations  No OT follow up;Supervision - Intermittent    Equipment Recommendations  Other (comment)(shower chair - pt to get on his own)    Recommendations for Other Services       Precautions / Restrictions Precautions Precautions: Cervical Precaution Booklet Issued: Yes (comment) Required Braces or Orthoses: Cervical Brace(per orders)      Mobility Bed Mobility Overal bed mobility: Modified Independent                Transfers Overall transfer level: Modified independent                    Balance                                           ADL either performed or assessed with clinical judgement   ADL Overall ADL's : Needs assistance/impaired                                     Functional mobility during ADLs: Modified independent General ADL Comments: Completed educaiton with pt/wife regarding managment of ADL while adhering to cervical precautions. Written information reviewed and provided. Wife verbalized understnaidng. Pt overall set up/superivison to adhere to precuations. Recommend useof shower chair. Wife ot get on his own.     Vision         Perception     Praxis      Pertinent Vitals/Pain Pain Assessment: 0-10 Pain Score: 2  Pain Location: neck/throat Pain Descriptors / Indicators: Discomfort;Burning Pain Intervention(s): Limited activity within patient's tolerance     Hand Dominance     Extremity/Trunk Assessment Upper Extremity Assessment Upper Extremity Assessment: Overall WFL for tasks assessed    Lower Extremity Assessment Lower Extremity Assessment: Defer to PT evaluation   Cervical / Trunk Assessment Cervical / Trunk Assessment: Other exceptions(cervical fusion)   Communication     Cognition Arousal/Alertness: Awake/alert Behavior During Therapy: WFL for tasks assessed/performed Overall Cognitive Status: Within Functional Limits for tasks assessed                                     General Comments       Exercises     Shoulder Instructions      Home Living Family/patient expects to be discharged to:: Private residence Living Arrangements: Spouse/significant other Available Help at Discharge: Family;Available 24 hours/day Type of Home: House             Bathroom Shower/Tub: Tub/shower unit;Door   [employer] Toilet: Standard Bathroom Accessibility: Yes How Accessible: Accessible via walker Home Equipment: None          Prior Functioning/Environment Level of Independence: Independent                 OT Problem List: Decreased knowledge of use of DME or AE;Decreased knowledge of precautions;Pain      OT Treatment/Interventions:  OT Goals(Current goals can be found in the care plan section) Acute Rehab OT Goals Patient Stated Goal: to go home OT Goal Formulation: All assessment and education complete, DC therapy  OT Frequency:     Barriers to D/C:            Co-evaluation              AM-PAC OT "6 Clicks" Daily Activity     Outcome Measure Help from another person eating meals?: None Help from another person taking care of personal grooming?: A Little Help from another person toileting, which includes using toliet, bedpan, or urinal?: A Little Help from another person bathing (including washing, rinsing, drying)?: A Little Help from another person to put on and taking off regular upper body clothing?: A Little Help from another person to put on and taking off regular lower body clothing?: A Little 6 Click  Score: 19   End of Session Equipment Utilized During Treatment: Cervical collar Nurse Communication: Mobility status;Other (comment)(DC needs)  Activity Tolerance: Patient tolerated treatment well Patient left: in bed;with call bell/phone within reach;with family/visitor present  OT Visit Diagnosis: Pain;Muscle weakness (generalized) (M62.81) Pain - part of body: (neck)                Time: [id number] OT Time Calculation (min): 19 min Charges:  OT General Charges $OT Visit: 1 Visit OT Evaluation $OT Eval Low Complexity: [place], OT/L   Acute OT Clinical Specialist [place] Pager [phone] Office [phone]   [facility] 03/16/2019, 10:57 AM

## 2019-03-16 NOTE — Discharge Summary (Signed)
Patient ID: David Cantrell MRN: [id number] DOB/AGE: July 18, 1940 79 y.o.  Admit date: 03/15/2019 Discharge date: 03/16/2019  Admission Diagnoses:  Active Problems:   Cervical radiculopathy   Discharge Diagnoses:  Active Problems:   Cervical radiculopathy  status post Procedure(s): ANTERIOR CERVICAL DECOMPRESSION/DISCECTOMY FUSION CERVICAL FIVE-CERVICAL SIX, CERVICAL SIX- CERVICAL SEVEN  Past Medical History:  Diagnosis Date  . Allergy   . Aortic ectasia (HCC)    a. 2.5cm aortic ectasia by CT 05/2017 (follow-up due 05/2022).  Marland Kitchen Arthritis   . Bronchiectasis ([place])    per pulmologist note -- dr Lake Bells-- related to multiple episodes pneumonia as adult  . CAD (coronary artery disease)    a. L PDA obstructive disease 2012, managed medically.  . Cancer ([place])    basal cell ca  . Cataract   . Centrilobular emphysema ([place])   . Congenital renal cyst   . COPD (chronic obstructive pulmonary disease) (HCC)    pulmologist-  dr Lake Bells  . Diverticulosis of colon   . Dyspnea    upon exerting self -beengoing on 10 yrs.  . Emphysema of lung ([place])   . First degree heart block   . Hemorrhoids   . History of adenomatous polyp of colon    hx multiple tubular adenoma's and hyperplastic polyp's since 2001--- last colonoscopy 05-29-2015  . History of basal cell carcinoma excision    10-31-2012  nose  . History of external beam radiation therapy 02-01-2016  to 03-16-2016   prostate 45Gy  plus radioactive prostate seed implants 04-07-2016  . History of kidney stones   . Hyperlipidemia   . Hypertension   . 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 Multiple pulmonary nodules    S/P  BILATERAL BRONCHOSCOPY  09-24-2014  no infection --    . Open-angle glaucoma    RIGHT EYE ONLY/OPEN ANGLE  . Pneumonia    x3  . Prostate cancer [facility]) urologist-  dr eskridge/  oncologist-  dr Tammi Klippel   Stage T1c,  Gleason3+4, PSA 4.7--  s/p  external beam radiation therapy 02-01-2016 to 03-16-2016  . Seasonal  allergies   . Thoracic aortic aneurysm ([place])    a. [name] following -4.3cm by CT 06/2018.  Marland Kitchen Wears dentures    full upper and lower parital    Surgeries: Procedure(s): ANTERIOR CERVICAL DECOMPRESSION/DISCECTOMY FUSION CERVICAL FIVE-CERVICAL SIX, CERVICAL SIX- CERVICAL SEVEN on 03/15/2019   Consultants:   Discharged Condition: Improved  Hospital Course: David Cantrell is an 79 y.o. male who was admitted 03/15/2019 for operative treatment of Cervical spondylotic radiculopathy. Patient failed conservative treatments (please see the history and physical for the specifics) and had severe unremitting pain that affects sleep, daily activities and work/hobbies. After pre-op clearance, the patient was taken to the operating room on 03/15/2019 and underwent  Procedure(s): ANTERIOR CERVICAL DECOMPRESSION/DISCECTOMY FUSION CERVICAL FIVE-CERVICAL SIX, CERVICAL SIX- CERVICAL SEVEN.    Patient was given perioperative antibiotics:  Anti-infectives (From admission, onward)   Start     Dose/Rate Route Frequency Ordered Stop   03/15/19 1700  ceFAZolin (ANCEF) IVPB 1 g/50 mL premix     1 g 100 mL/hr over 30 Minutes Intravenous Every 8 hours 03/15/19 1503 03/16/19 0113   03/15/19 0659  ceFAZolin (ANCEF) 2-4 GM/100ML-% IVPB    Note to Pharmacy: [facility]   : cabinet override      03/15/19 0659 03/15/19 0903   03/15/19 0657  ceFAZolin (ANCEF) IVPB 2g/100 mL premix     2  g 200 mL/hr over 30 Minutes Intravenous 30 min pre-op 03/15/19 [id number] 03/15/19 0855       Patient was given sequential compression devices and early ambulation to prevent DVT.   Patient benefited maximally from hospital stay and there were no complications. At the time of discharge, the patient was urinating/moving their bowels without difficulty, tolerating a regular diet, pain is controlled with oral pain medications and they have been cleared by PT/OT.   Recent vital signs:  Patient Vitals for the past 24 hrs:  BP Temp Temp src  Pulse Resp SpO2  03/16/19 0753 128/79 98.6 F (37 C) Oral 87 19 99 %  03/16/19 0336 106/62 98.4 F (36.9 C) Oral 71 20 97 %  03/15/19 2318 (!) 105/51 98.5 F (36.9 C) Oral 73 20 97 %  03/15/19 1948 111/68 98.3 F (36.8 C) Oral 87 18 96 %  03/15/19 1508 (!) 142/80 97.7 F (36.5 C) Oral 75 16 99 %  03/15/19 1447 126/67 98.1 F (36.7 C) -- 78 14 94 %  03/15/19 1432 129/78 -- -- 74 (!) 7 92 %     Recent laboratory studies: No results for input(s): WBC, HGB, HCT, PLT, NA, K, CL, CO2, BUN, CREATININE, GLUCOSE, INR, CALCIUM in the last 72 hours.  Invalid input(s): PT, 2   Discharge Medications:   Allergies as of 03/16/2019      Reactions   Doxycycline Other (See Comments)   Dizziness   Lipitor [atorvastatin] Other (See Comments)   MUSCLE ACHES   Morphine And Related Other (See Comments)   Severe headache   Niacin And Related Other (See Comments)   FLUSHING   Ramipril Cough   Sulfa Antibiotics Hives   Codeine Other (See Comments)   CONSTIPATION      Medication List    STOP taking these medications   acetaminophen 650 MG CR tablet Commonly known as: TYLENOL   aspirin EC 81 MG tablet   Fish Oil 0000000 MG Caps   Garlic 123XX123 MG Caps   ICAPS AREDS 2 PO   multivitamin with minerals Tabs tablet     TAKE these medications   alfuzosin 10 MG 24 hr tablet Commonly known as: UROXATRAL Take 10 mg by mouth daily.   Align 4 MG Caps Take 4 mg by mouth daily.   Breo Ellipta 200-25 MCG/INH Aepb Generic drug: fluticasone furoate-vilanterol Inhale 1 puff into the lungs daily. What changed:   when to take this  reasons to take this   brimonidine 0.15 % ophthalmic solution Commonly known as: ALPHAGAN Place 1 drop into the right eye 2 (two) times daily.   docusate sodium 100 MG capsule Commonly known as: COLACE Take 100 mg by mouth daily.   dorzolamide 2 % ophthalmic solution Commonly known as: TRUSOPT Place 1 drop into the right eye 2 (two) times daily.   fenofibrate  160 MG tablet Take 1 tablet by mouth once daily   HYDROcodone-acetaminophen 10-325 MG tablet Commonly known as: Norco Take 1 tablet by mouth every 6 (six) hours as needed for up to 5 days. What changed: reasons to take this   hyoscyamine 0.125 MG SL tablet Commonly known as: LEVSIN SL PLACE 1 TABLET UNDER THE TONGUE AS NEEDED   Lumigan 0.01 % Soln Generic drug: bimatoprost Place 1 drop into both eyes at bedtime.   methocarbamol 500 MG tablet Commonly known as: Robaxin Take 1 tablet (500 mg total) by mouth every 8 (eight) hours as needed for up to 5 days for  muscle spasms.   nitroGLYCERIN 0.4 MG SL tablet Commonly known as: NITROSTAT Place 1 tablet (0.4 mg total) under the tongue every 5 (five) minutes as needed for chest pain.   ondansetron 4 MG tablet Commonly known as: Zofran Take 1 tablet (4 mg total) by mouth every 8 (eight) hours as needed for nausea or vomiting.   polyethylene glycol-electrolytes 420 g solution Commonly known as: NuLYTELY peg-electrolyte solution 420 gram oral solution  TAKE AS DIRECTED   ProAir HFA 108 (90 Base) MCG/ACT inhaler Generic drug: albuterol Inhale 1-2 puffs into the lungs every 6 (six) hours as needed for wheezing or shortness of breath.   psyllium 58.6 % packet Commonly known as: METAMUCIL Take 1 packet by mouth daily.   rosuvastatin 10 MG tablet Commonly known as: CRESTOR Take 1 tablet (10 mg total) by mouth 2 (two) times a week. Monday and thursday What changed: additional instructions   tamsulosin 0.4 MG Caps capsule Commonly known as: FLOMAX Take 2 capsules (0.8 mg total) by mouth at bedtime.   tiotropium 18 MCG inhalation capsule Commonly known as: SPIRIVA Place 18 mcg into inhaler and inhale daily.   Vitamin D 50 MCG (2000 UT) Caps Take 2,000 Units by mouth 2 (two) times daily after a meal.       Diagnostic Studies: DG Chest 2 View  Result Date: 03/09/2019 CLINICAL DATA:  79 year old male with history of cervical  fusion. Former smoker. EXAM: CHEST - 2 VIEW COMPARISON:  Chest x-ray 10/11/2017. FINDINGS: Lung volumes are normal. No consolidative airspace disease. No pleural effusions. No pneumothorax. No pulmonary nodule or mass noted. Pulmonary vasculature and the cardiomediastinal silhouette are within normal limits. Aortic atherosclerosis. IMPRESSION: 1.  No radiographic evidence of acute cardiopulmonary disease. 2. Aortic atherosclerosis. Electronically Signed   By: Vinnie Langton M.D.   On: 03/09/2019 15:59   DG Cervical Spine 2-3 Views  Result Date: 03/15/2019 CLINICAL DATA:  Intraoperative imaging for C5-7 ACDF. EXAM: CERVICAL SPINE - 2-3 VIEW; DG C-ARM 1-60 MIN COMPARISON:  None. FINDINGS: Three fluoroscopic spot views of the cervical spine demonstrate anterior plate and screws and interbody spacers in place from C5-C7. No acute abnormality. IMPRESSION: Intraoperative imaging for C5-7 ACDF. Electronically Signed   By: Inge Rise M.D.   On: 03/15/2019 12:35   DG C-Arm 1-60 Min  Result Date: 03/15/2019 CLINICAL DATA:  Intraoperative imaging for C5-7 ACDF. EXAM: CERVICAL SPINE - 2-3 VIEW; DG C-ARM 1-60 MIN COMPARISON:  None. FINDINGS: Three fluoroscopic spot views of the cervical spine demonstrate anterior plate and screws and interbody spacers in place from C5-C7. No acute abnormality. IMPRESSION: Intraoperative imaging for C5-7 ACDF. Electronically Signed   By: Inge Rise M.D.   On: 03/15/2019 12:35    Discharge Instructions    Incentive spirometry RT   Complete by: As directed       Follow-up [place], Dahari, MD In 2 weeks.   Specialty: Orthopedic Surgery Why: If symptoms worsen, For suture removal, For wound re-check Contact information: [address] [place] [address] [id number]           Discharge Plan:  discharge to home  Disposition: stable    Signed: Yvonne Kendall Victorino for [facility] PA-C [facility] [phone] 03/16/2019, 2:27 PM

## 2019-03-16 NOTE — Evaluation (Signed)
Physical Therapy Evaluation and Discharge Patient Details Name: David Cantrell MRN: [id number] DOB: [birth year] Today's Date: 03/16/2019   History of Present Illness  79 yo s/p ACDF C5-C7 on 03/15/2019.    Clinical Impression  Patient evaluated by Physical Therapy with no further acute PT needs identified. All education has been completed and the patient has no further questions. Pt was able to demonstrate transfers and ambulation with gross modified independence and no AD. Overall pt appeared appropriately guarded with functional mobility but was safe. Pt was educated on precautions, brace application/wearing schedule, appropriate activity progression, and car transfer. See below for any follow-up Physical Therapy or equipment needs. PT is signing off. Thank you for this referral.     Follow Up Recommendations No PT follow up;Supervision for mobility/OOB    Equipment Recommendations  None recommended by PT    Recommendations for Other Services       Precautions / Restrictions Precautions Precautions: Cervical Precaution Booklet Issued: Yes (comment) Required Braces or Orthoses: Cervical Brace(per orders) Cervical Brace: Hard collar Restrictions Weight Bearing Restrictions: No      Mobility  Bed Mobility               General bed mobility comments: Pt was received sitting up EOB when PT arrived.  Transfers Overall transfer level: Modified independent Equipment used: None                Ambulation/Gait Ambulation/Gait assistance: Modified independent (Device/Increase time) Gait Distance (Feet): 400 Feet Assistive device: None Gait Pattern/deviations: Step-through pattern;Decreased stride length;Trunk flexed Gait velocity: Decreased Gait velocity interpretation: 1.31 - 2.62 ft/sec, indicative of limited community ambulator General Gait Details: VC's for improved posture.  Stairs            Wheelchair Mobility    Modified Rankin (Stroke Patients Only)        Balance Overall balance assessment: Needs assistance Sitting-balance support: Feet supported;No upper extremity supported Sitting balance-Leahy Scale: Good     Standing balance support: No upper extremity supported Standing balance-Leahy Scale: Fair                               Pertinent Vitals/Pain Pain Assessment: Faces Faces Pain Scale: Hurts little more Pain Location: throat is sore, R shoulder, and cervical incision site Pain Descriptors / Indicators: Operative site guarding;Burning;Sore Pain Intervention(s): Limited activity within patient's tolerance;Monitored during session;Repositioned    Home Living Family/patient expects to be discharged to:: Private residence Living Arrangements: Spouse/significant other Available Help at Discharge: Family;Available 24 hours/day Type of Home: House Home Access: Level entry     Home Layout: One level Home Equipment: None      Prior Function Level of Independence: Independent               Hand Dominance        Extremity/Trunk Assessment   Upper Extremity Assessment Upper Extremity Assessment: Defer to OT evaluation    Lower Extremity Assessment Lower Extremity Assessment: Generalized weakness    Cervical / Trunk Assessment Cervical / Trunk Assessment: Other exceptions(cervical fusion)  Communication   Communication: No difficulties(Mildly HOH )  Cognition Arousal/Alertness: Awake/alert Behavior During Therapy: WFL for tasks assessed/performed Overall Cognitive Status: Within Functional Limits for tasks assessed  General Comments      Exercises     Assessment/Plan    PT Assessment Patent does not need any further PT services  PT Problem List         PT Treatment Interventions      PT Goals (Current goals can be found in the Care Plan section)  Acute Rehab PT Goals Patient Stated Goal: to go home PT Goal Formulation: All  assessment and education complete, DC therapy    Frequency     Barriers to discharge        Co-evaluation               AM-PAC PT "6 Clicks" Mobility  Outcome Measure Help needed turning from your back to your side while in a flat bed without using bedrails?: None Help needed moving from lying on your back to sitting on the side of a flat bed without using bedrails?: None Help needed moving to and from a bed to a chair (including a wheelchair)?: None Help needed standing up from a chair using your arms (e.g., wheelchair or bedside chair)?: None Help needed to walk in hospital room?: None Help needed climbing 3-5 steps with a railing? : None 6 Click Score: 24    End of Session Equipment Utilized During Treatment: Gait belt;Cervical collar Activity Tolerance: Patient tolerated treatment well Patient left: in chair;with call bell/phone within reach Nurse Communication: Mobility status PT Visit Diagnosis: Unsteadiness on feet (R26.81);Pain Pain - Right/Left: Right Pain - part of body: Shoulder(and neck)    Time: [id number] PT Time Calculation (min) (ACUTE ONLY): 21 min   Charges:   PT Evaluation $PT Eval Low Complexity: 1 Low          Rolinda Roan, PT, DPT Acute Rehabilitation Services Pager: [phone] Office: [phone]   Thelma Comp 03/16/2019, 2:16 PM

## 2019-03-16 NOTE — Progress Notes (Signed)
Subjective: 1 Day Post-Op Procedure(s) (LRB): ANTERIOR CERVICAL DECOMPRESSION/DISCECTOMY FUSION CERVICAL FIVE-CERVICAL SIX, CERVICAL SIX- CERVICAL SEVEN (N/A) Patient reports pain as mild.  Reports resolution of radicular arm pain. Tolerating PO without N/V +ambulation +void +flatus Denies HA, calf pain, CP, SOB  Objective: Vital signs in last 24 hours: Temp:  [97.7 F (36.5 C)-98.5 F (36.9 C)] 98.4 F (36.9 C) (03/04 0336) Pulse Rate:  [70-87] 71 (03/04 0336) Resp:  [7-20] 20 (03/04 0336) BP: (98-142)/(48-82) 106/62 (03/04 0336) SpO2:  [92 %-99 %] 97 % (03/04 0336)  Intake/Output from previous day: 03/03 0701 - 03/04 0700 In: 1350 [I.V.:1100; IV Piggyback:250] Out: 50 [Blood:50] Intake/Output this shift: No intake/output data recorded.  No results for input(s): HGB in the last 72 hours. No results for input(s): WBC, RBC, HCT, PLT in the last 72 hours. No results for input(s): NA, K, CL, CO2, BUN, CREATININE, GLUCOSE, CALCIUM in the last 72 hours. No results for input(s): LABPT, INR in the last 72 hours.  Neurologically intact ABD soft Neurovascular intact Sensation intact distally Intact pulses distally Dorsiflexion/Plantar flexion intact Incision: dressing C/D/I Compartment soft   Assessment/Plan: 1 Day Post-Op Procedure(s) (LRB): ANTERIOR CERVICAL DECOMPRESSION/DISCECTOMY FUSION CERVICAL FIVE-CERVICAL SIX, CERVICAL SIX- CERVICAL SEVEN (N/A) Advance diet Up with therapy DVT PPx: Teds, SCDs, ambulation Encourage Is  Plan D/C today after PT  F/u in 2 weeks  Yvonne Kendall [place] 03/16/2019, 7:53 AM

## 2019-03-16 NOTE — Plan of Care (Signed)
Pt doing well. Pt and wife given D/C instructions with verbal understanding. Rx's were given to Pt. Pt's incision is clean and dry with no sign of infection. Pt's IV was removed prior to D/C. Pt D/C'd home via wheelchair per MD order. Pt is stable @ D/C and has no other needs at this time. Holli Humbles, RN

## 2019-04-19 DIAGNOSIS — H401131 Primary open-angle glaucoma, bilateral, mild stage: Secondary | ICD-10-CM | POA: Diagnosis not present

## 2019-04-25 DIAGNOSIS — Z4889 Encounter for other specified surgical aftercare: Secondary | ICD-10-CM | POA: Diagnosis not present

## 2019-04-28 DIAGNOSIS — M5412 Radiculopathy, cervical region: Secondary | ICD-10-CM | POA: Diagnosis not present

## 2019-05-19 ENCOUNTER — Other Ambulatory Visit: Payer: Self-pay | Admitting: Cardiology

## 2019-05-26 ENCOUNTER — Other Ambulatory Visit: Payer: Self-pay | Admitting: *Deleted

## 2019-05-26 DIAGNOSIS — I712 Thoracic aortic aneurysm, without rupture, unspecified: Secondary | ICD-10-CM

## 2019-06-06 DIAGNOSIS — Z981 Arthrodesis status: Secondary | ICD-10-CM | POA: Diagnosis not present

## 2019-06-06 DIAGNOSIS — M545 Low back pain: Secondary | ICD-10-CM | POA: Diagnosis not present

## 2019-06-06 DIAGNOSIS — Z4889 Encounter for other specified surgical aftercare: Secondary | ICD-10-CM | POA: Diagnosis not present

## 2019-06-16 DIAGNOSIS — M545 Low back pain: Secondary | ICD-10-CM | POA: Diagnosis not present

## 2019-06-26 DIAGNOSIS — M5136 Other intervertebral disc degeneration, lumbar region: Secondary | ICD-10-CM | POA: Diagnosis not present

## 2019-06-28 ENCOUNTER — Other Ambulatory Visit: Payer: Self-pay

## 2019-06-28 ENCOUNTER — Encounter: Payer: Self-pay | Admitting: Surgery

## 2019-06-28 ENCOUNTER — Ambulatory Visit
Admission: RE | Admit: 2019-06-28 | Discharge: 2019-06-28 | Disposition: A | Discharge status: Home / Self Care | Payer: PPO | Source: Ambulatory Visit | Attending: Surgery | Admitting: Surgery

## 2019-06-28 ENCOUNTER — Ambulatory Visit: Payer: PPO | Admitting: Surgery

## 2019-06-28 VITALS — BP 132/76 | HR 65 | Temp 98.1°F | Resp 20 | Ht 73.0 in | Wt 219.0 lb

## 2019-06-28 DIAGNOSIS — I712 Thoracic aortic aneurysm, without rupture, unspecified: Secondary | ICD-10-CM

## 2019-06-28 MED ORDER — IOPAMIDOL (ISOVUE-370) INJECTION 76%
75.0000 mL | Freq: Once | INTRAVENOUS | Status: AC | PRN
  Administered 2019-06-28: 75 mL via INTRAVENOUS

## 2019-06-28 NOTE — Progress Notes (Signed)
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  When I saw him 1 year ago he was complaining of substernal chest discomfort, shortness of breath, and fatigue and was seen by Dr. Martinique.  He underwent cardiac catheterization on 06/28/2018 which showed mild nonobstructive coronary disease with a normal ejection fraction of 55 to 65%.  He currently denies any chest pain but does report some shortness of breath and exhaustion when bending over to do any activities.  Current Outpatient Medications  Medication Sig Dispense Refill  . alfuzosin (UROXATRAL) 10 MG 24 hr tablet Take 10 mg by mouth daily.    . bimatoprost (LUMIGAN) 0.01 % SOLN Place 1 drop into both eyes at bedtime.     . brimonidine (ALPHAGAN) 0.15 % ophthalmic solution Place 1 drop into the right eye 2 (two) times daily.     . Cholecalciferol (VITAMIN D) 50 MCG (2000 UT) CAPS Take 2,000 Units by mouth 2 (two) times daily after a meal.    . docusate sodium (COLACE) 100 MG capsule Take 100 mg by mouth daily.    . dorzolamide (TRUSOPT) 2 % ophthalmic solution Place 1 drop into the right eye 2 (two) times daily.     . fenofibrate 160 MG tablet Take 1 tablet by mouth once daily (Patient taking differently: Take 160 mg by mouth daily. ) 90 tablet 3  . fluticasone furoate-vilanterol (BREO ELLIPTA) 200-25 MCG/INH AEPB Inhale 1 puff into the lungs daily. (Patient taking differently: Inhale 1 puff into the lungs daily as needed (shortness of breath). ) 1 each 5  . hyoscyamine (LEVSIN SL) 0.125 MG SL tablet PLACE 1 TABLET UNDER THE TONGUE AS NEEDED 20 tablet 0  . nitroGLYCERIN (NITROSTAT) 0.4 MG SL tablet Place 1 tablet (0.4 mg total) under the tongue every 5 (five) minutes as needed for chest pain. 25 tablet 6  . ondansetron (ZOFRAN) 4 MG  tablet Take 1 tablet (4 mg total) by mouth every 8 (eight) hours as needed for nausea or vomiting. 20 tablet 0  . polyethylene glycol-electrolytes (NULYTELY) 420 g solution peg-electrolyte solution 420 gram oral solution  TAKE AS DIRECTED    . PROAIR HFA 108 (90 Base) MCG/ACT inhaler Inhale 1-2 puffs into the lungs every 6 (six) hours as needed for wheezing or shortness of breath. 18 g 5  . Probiotic Product (ALIGN) 4 MG CAPS Take 4 mg by mouth daily.     . psyllium (METAMUCIL) 58.6 % packet Take 1 packet by mouth daily.     . rosuvastatin (CRESTOR) 10 MG tablet TAKE 1 TABLET BY MOUTH TWICE  A WEEK. MONDAY AND THURSDAY. 30 tablet 0  . tiotropium (SPIRIVA) 18 MCG inhalation capsule Place 18 mcg into inhaler and inhale daily.     No current facility-administered medications for this visit.     Physical Exam: BP 132/76 (BP Location: Right Arm, Patient Position: Sitting, Cuff Size: Normal)   Pulse 65   Temp 98.1 F (36.7 C) (Temporal)   Resp 20   Ht 6\' 1"  (1.854 m)   Wt 219 lb (99.3 kg)   SpO2 97% Comment: RA  BMI 28.89 kg/m  He looks well. Cardiac exam shows a regular rate and rhythm with normal heart sounds.  There is no murmur. Lungs are clear. There is  no peripheral edema.  Diagnostic Tests:  CLINICAL DATA:  Follow-up thoracic aneurysm  EXAM: CT ANGIOGRAPHY CHEST WITH CONTRAST  TECHNIQUE: Multidetector CT imaging of the chest was performed using the standard protocol during bolus administration of intravenous contrast. Multiplanar CT image reconstructions and MIPs were obtained to evaluate the vascular anatomy.  CONTRAST:  24mL ISOVUE-370 IOPAMIDOL (ISOVUE-370) INJECTION 76%  COMPARISON:  06/22/2018  FINDINGS: Cardiovascular: Thoracic aorta demonstrates a normal branching pattern. Atherosclerotic calcifications are seen. Dilatation of the ascending aorta to 4.3 cm is again noted and stable. Sino-tubular junction measures 4.3 and sino-tubular junction measures  3.4 cm. No dissection is seen. Mild coronary calcifications are noted. No cardiac enlargement is noted. The pulmonary artery as visualized shows no pulmonary embolism.  Mediastinum/Nodes: Thoracic inlet is within normal limits. No hilar or mediastinal adenopathy is noted. The esophagus as visualized is within normal limits.  Lungs/Pleura: Lungs are well aerated bilaterally. No focal infiltrate or sizable effusion is noted. Mild emphysematous change is seen. Scattered pulmonary nodules are noted primarily within the lower lobes bilaterally. These are stable in appearance from the prior exam and by history stable from 2015 consistent with a benign etiology. No new focal sizable nodule is seen.  Upper Abdomen: Visualized upper abdomen demonstrates right renal cystic change. No other focal abnormality is noted.  Musculoskeletal: Mild degenerative change of the thoracic spine is noted. Postsurgical changes in the cervical spine are seen.  Review of the MIP images confirms the above findings.  IMPRESSION: Stable dilatation of the ascending aorta to 4.3 cm. Recommend annual imaging followup by CTA or MRA. This recommendation follows 2010 ACCF/AHA/AATS/ACR/ASA/SCA/SCAI/SIR/STS/SVM Guidelines for the Diagnosis and Management of Patients with Thoracic Aortic Disease. Circulation. 2010; 121: H962-I297. Aortic aneurysm NOS (ICD10-I71.9)  Stable parenchymal nodules within the lungs bilaterally. These are felt to be benign in etiology given their long-term stability over 6 years.  Aortic aneurysm NOS (ICD10-I71.9).  Aortic Atherosclerosis (ICD10-I70.0) and Emphysema (ICD10-J43.9).   Electronically Signed   By: Inez Catalina M.D.   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  This is not changed over the past few years.  It is well below the surgical threshold of 5.5 cm.  The previously seen lung nodules are stable and felt  to be benign.  I reviewed the CTA images with the patient and his wife and answered their questions.  I stressed the importance of continued good blood pressure control and preventing further enlargement and acute aortic dissection.  I think it is reasonable to wait 2 years to repeat the scan since this is a relatively small aneurysm and has been stable for several years.  Plan:  I will plan to see him back in 2 years with a CTA of the chest.   I spent 20 minutes performing this established patient evaluation and > 50% of this time was spent face to face counseling and coordinating the care of this patient's aortic aneurysm and bilateral pulmonary nodules.  Gaye Pollack, MD [facility] [phone]

## 2019-07-19 DIAGNOSIS — H401131 Primary open-angle glaucoma, bilateral, mild stage: Secondary | ICD-10-CM | POA: Diagnosis not present

## 2019-07-20 DIAGNOSIS — M5136 Other intervertebral disc degeneration, lumbar region: Secondary | ICD-10-CM | POA: Diagnosis not present

## 2019-08-03 DIAGNOSIS — L821 Other seborrheic keratosis: Secondary | ICD-10-CM | POA: Diagnosis not present

## 2019-08-03 DIAGNOSIS — L819 Disorder of pigmentation, unspecified: Secondary | ICD-10-CM | POA: Diagnosis not present

## 2019-08-03 DIAGNOSIS — L570 Actinic keratosis: Secondary | ICD-10-CM | POA: Diagnosis not present

## 2019-08-22 ENCOUNTER — Encounter: Payer: Self-pay | Admitting: Internal Medicine

## 2019-08-22 ENCOUNTER — Other Ambulatory Visit: Payer: Self-pay

## 2019-08-22 ENCOUNTER — Telehealth (INDEPENDENT_AMBULATORY_CARE_PROVIDER_SITE_OTHER): Payer: PPO | Admitting: Internal Medicine

## 2019-08-22 DIAGNOSIS — R0989 Other specified symptoms and signs involving the circulatory and respiratory systems: Secondary | ICD-10-CM

## 2019-08-22 NOTE — Assessment & Plan Note (Signed)
Given lack of vaccination status needs testing against covid-19 and talked to him about options for testing. Start allegra to help with sinus congestion. Advised to inform son about potential illness so he can decide about visiting and if son still comes to quarantine away from him until covid-19 test results return.

## 2019-08-22 NOTE — Progress Notes (Signed)
Virtual Visit via Audio Note  I connected with David Cantrell on 08/22/19 at  9:20 AM EDT by an audio-only enabled telemedicine application and verified that I am speaking with the correct person using two identifiers.  The patient and the provider were at separate locations throughout the entire encounter. Patient location: home, Provider location: work   I discussed the limitations of evaluation and management by telemedicine and the availability of in person appointments. The patient expressed understanding and agreed to proceed. The patient and the provider were the only parties present for the visit unless noted in HPI below.  History of Present Illness: The patient is a 79 y.o. man with visit for cough, congestion, muscle aches. Started Saturday with sitting in front of a fan. Has congestion in the sinuses, mild chills. Denies SOB but does have some cough which is new. Overall it is getting slightly better but definitely not worse. Has not been vaccinated against covid-19 currently. Denies leaving the home much or being exposed to any covid-19 positive individuals that he knows of. Has tried tylenol and mucinex.He is concerned if he is contagious as his son is coming into town tomorrow and he wants to be feeling better by then.   Observations/Objective: Voice strong, A and O times 3, mild cough during visit but no dyspnea  Assessment and Plan: See problem oriented charting  Follow Up Instructions: needs covid-19 testing, advised to take allegra which he has at home. No indication for antibiotics currently. Advised to quarantine away from his son if he decides to still come and inform son before he gets into town that he is under testing for covid-19  Visit time 11 minutes in non-face to face communication with patient and coordination of care  I discussed the assessment and treatment plan with the patient. The patient was provided an opportunity to ask questions and all were answered. The  patient agreed with the plan and demonstrated an understanding of the instructions.   The patient was advised to call back or seek an in-person evaluation if the symptoms worsen or if the condition fails to improve as anticipated.  Hoyt Koch, MD

## 2019-08-23 DIAGNOSIS — Z20828 Contact with and (suspected) exposure to other viral communicable diseases: Secondary | ICD-10-CM | POA: Diagnosis not present

## 2019-08-25 ENCOUNTER — Telehealth: Payer: Self-pay | Admitting: Internal Medicine

## 2019-08-25 NOTE — Telephone Encounter (Signed)
Left message for patient to return call to clinic to discuss.

## 2019-08-25 NOTE — Telephone Encounter (Signed)
Would recommend monoclonal antibody infusion given his risk factors, please call [phone] to get the infusion center to reach out to him and wife to get scheduled.

## 2019-08-25 NOTE — Telephone Encounter (Signed)
Spoke with patient and called infusion center. Message left with patient's info for them to reach out to patient to get him and his wife set up for infusions.

## 2019-08-25 NOTE — Telephone Encounter (Signed)
New message:   Pt is calling and states him and his wife have tested positive for covid and states they need to know what to do. They are both pt's of Dr. Quay Burow. Please advise.

## 2019-08-28 ENCOUNTER — Other Ambulatory Visit (HOSPITAL_COMMUNITY): Payer: Self-pay | Admitting: Nurse Practitioner

## 2019-08-28 ENCOUNTER — Encounter: Payer: Self-pay | Admitting: Nurse Practitioner

## 2019-08-28 DIAGNOSIS — U071 COVID-19: Secondary | ICD-10-CM

## 2019-08-28 NOTE — Progress Notes (Signed)
I connected by phone with Mora Appl Kinnaird on 08/28/2019 at 1:05 PM to discuss the potential use of an new treatment for mild to moderate COVID-19 viral infection in non-hospitalized patients.  This patient is a 79 y.o. male that meets the FDA criteria for Emergency Use Authorization of casirivimab\imdevimab.  Has a (+) direct SARS-CoV-2 viral test result  Has mild or moderate COVID-19   Is ? 79 years of age and weighs ? 40 kg  Is NOT hospitalized due to COVID-19  Is NOT requiring oxygen therapy or requiring an increase in baseline oxygen flow rate due to COVID-19  Is within 10 days of symptom onset  Has at least one of the high risk factor(s) for progression to severe COVID-19 and/or hospitalization as defined in EUA.  Specific high risk criteria : Older age (>/= 78 yo), CAD, COPD, hypertension  Sx onset 08/20/19.    I have spoken and communicated the following to the patient or parent/caregiver:  1. FDA has authorized the emergency use of casirivimab\imdevimab for the treatment of mild to moderate COVID-19 in adults and pediatric patients with positive results of direct SARS-CoV-2 viral testing who are 52 years of age and older weighing at least 40 kg, and who are at high risk for progressing to severe COVID-19 and/or hospitalization.  2. The significant known and potential risks and benefits of casirivimab\imdevimab, and the extent to which such potential risks and benefits are unknown.  3. Information on available alternative treatments and the risks and benefits of those alternatives, including clinical trials.  4. Patients treated with casirivimab\imdevimab should continue to self-isolate and use infection control measures (e.g., wear mask, isolate, social distance, avoid sharing personal items, clean and disinfect "high touch" surfaces, and frequent handwashing) according to CDC guidelines.   5. The patient or parent/caregiver has the option to accept or refuse casirivimab\imdevimab  .  After reviewing this information with the patient, The patient agreed to proceed with receiving casirivimab\imdevimab infusion and will be provided a copy of the Fact sheet prior to receiving the infusion.Beckey Rutter, [place], AGNP-C [phone] ([place])

## 2019-08-29 ENCOUNTER — Inpatient Hospital Stay (HOSPITAL_COMMUNITY)
Admission: EM | Admit: 2019-08-29 | Discharge: 2019-09-13 | DRG: 177 | Disposition: E | Payer: PPO | Source: Ambulatory Visit | Attending: Internal Medicine | Admitting: Internal Medicine

## 2019-08-29 ENCOUNTER — Ambulatory Visit (HOSPITAL_COMMUNITY)
Admission: RE | Admit: 2019-08-29 | Discharge: 2019-08-29 | Disposition: A | Discharge status: Home / Self Care | Payer: PPO | Source: Ambulatory Visit | Attending: Pulmonary Disease | Admitting: Pulmonary Disease

## 2019-08-29 ENCOUNTER — Encounter (HOSPITAL_COMMUNITY): Payer: Self-pay

## 2019-08-29 ENCOUNTER — Other Ambulatory Visit: Payer: Self-pay

## 2019-08-29 ENCOUNTER — Emergency Department (HOSPITAL_COMMUNITY): Payer: PPO

## 2019-08-29 DIAGNOSIS — R0902 Hypoxemia: Secondary | ICD-10-CM

## 2019-08-29 DIAGNOSIS — I440 Atrioventricular block, first degree: Secondary | ICD-10-CM | POA: Diagnosis not present

## 2019-08-29 DIAGNOSIS — I129 Hypertensive chronic kidney disease with stage 1 through stage 4 chronic kidney disease, or unspecified chronic kidney disease: Secondary | ICD-10-CM | POA: Diagnosis not present

## 2019-08-29 DIAGNOSIS — Z515 Encounter for palliative care: Secondary | ICD-10-CM | POA: Diagnosis not present

## 2019-08-29 DIAGNOSIS — E785 Hyperlipidemia, unspecified: Secondary | ICD-10-CM | POA: Diagnosis present

## 2019-08-29 DIAGNOSIS — Z981 Arthrodesis status: Secondary | ICD-10-CM | POA: Diagnosis not present

## 2019-08-29 DIAGNOSIS — Z881 Allergy status to other antibiotic agents status: Secondary | ICD-10-CM

## 2019-08-29 DIAGNOSIS — K5730 Diverticulosis of large intestine without perforation or abscess without bleeding: Secondary | ICD-10-CM | POA: Diagnosis present

## 2019-08-29 DIAGNOSIS — I2699 Other pulmonary embolism without acute cor pulmonale: Secondary | ICD-10-CM | POA: Diagnosis present

## 2019-08-29 DIAGNOSIS — Z885 Allergy status to narcotic agent status: Secondary | ICD-10-CM

## 2019-08-29 DIAGNOSIS — J440 Chronic obstructive pulmonary disease with acute lower respiratory infection: Secondary | ICD-10-CM | POA: Diagnosis present

## 2019-08-29 DIAGNOSIS — J9601 Acute respiratory failure with hypoxia: Secondary | ICD-10-CM

## 2019-08-29 DIAGNOSIS — I712 Thoracic aortic aneurysm, without rupture, unspecified: Secondary | ICD-10-CM | POA: Diagnosis present

## 2019-08-29 DIAGNOSIS — J432 Centrilobular emphysema: Secondary | ICD-10-CM | POA: Diagnosis present

## 2019-08-29 DIAGNOSIS — Z886 Allergy status to analgesic agent status: Secondary | ICD-10-CM

## 2019-08-29 DIAGNOSIS — H4010X0 Unspecified open-angle glaucoma, stage unspecified: Secondary | ICD-10-CM | POA: Diagnosis present

## 2019-08-29 DIAGNOSIS — R21 Rash and other nonspecific skin eruption: Secondary | ICD-10-CM | POA: Diagnosis not present

## 2019-08-29 DIAGNOSIS — J9690 Respiratory failure, unspecified, unspecified whether with hypoxia or hypercapnia: Secondary | ICD-10-CM | POA: Diagnosis not present

## 2019-08-29 DIAGNOSIS — Z85828 Personal history of other malignant neoplasm of skin: Secondary | ICD-10-CM

## 2019-08-29 DIAGNOSIS — C61 Malignant neoplasm of prostate: Secondary | ICD-10-CM | POA: Diagnosis present

## 2019-08-29 DIAGNOSIS — U071 COVID-19: Secondary | ICD-10-CM | POA: Diagnosis not present

## 2019-08-29 DIAGNOSIS — N183 Chronic kidney disease, stage 3 unspecified: Secondary | ICD-10-CM | POA: Diagnosis present

## 2019-08-29 DIAGNOSIS — L8990 Pressure ulcer of unspecified site, unspecified stage: Secondary | ICD-10-CM | POA: Insufficient documentation

## 2019-08-29 DIAGNOSIS — Z823 Family history of stroke: Secondary | ICD-10-CM

## 2019-08-29 DIAGNOSIS — I2510 Atherosclerotic heart disease of native coronary artery without angina pectoris: Secondary | ICD-10-CM | POA: Diagnosis not present

## 2019-08-29 DIAGNOSIS — Z978 Presence of other specified devices: Secondary | ICD-10-CM

## 2019-08-29 DIAGNOSIS — D631 Anemia in chronic kidney disease: Secondary | ICD-10-CM | POA: Diagnosis not present

## 2019-08-29 DIAGNOSIS — I517 Cardiomegaly: Secondary | ICD-10-CM | POA: Diagnosis not present

## 2019-08-29 DIAGNOSIS — R918 Other nonspecific abnormal finding of lung field: Secondary | ICD-10-CM | POA: Diagnosis not present

## 2019-08-29 DIAGNOSIS — Z8371 Family history of colonic polyps: Secondary | ICD-10-CM

## 2019-08-29 DIAGNOSIS — R739 Hyperglycemia, unspecified: Secondary | ICD-10-CM | POA: Diagnosis not present

## 2019-08-29 DIAGNOSIS — Z7982 Long term (current) use of aspirin: Secondary | ICD-10-CM

## 2019-08-29 DIAGNOSIS — J90 Pleural effusion, not elsewhere classified: Secondary | ICD-10-CM | POA: Diagnosis not present

## 2019-08-29 DIAGNOSIS — N1831 Chronic kidney disease, stage 3a: Secondary | ICD-10-CM | POA: Diagnosis not present

## 2019-08-29 DIAGNOSIS — Z7951 Long term (current) use of inhaled steroids: Secondary | ICD-10-CM

## 2019-08-29 DIAGNOSIS — I82403 Acute embolism and thrombosis of unspecified deep veins of lower extremity, bilateral: Secondary | ICD-10-CM | POA: Diagnosis not present

## 2019-08-29 DIAGNOSIS — J1282 Pneumonia due to coronavirus disease 2019: Secondary | ICD-10-CM

## 2019-08-29 DIAGNOSIS — Z66 Do not resuscitate: Secondary | ICD-10-CM | POA: Diagnosis not present

## 2019-08-29 DIAGNOSIS — Z800 Family history of malignant neoplasm of digestive organs: Secondary | ICD-10-CM

## 2019-08-29 DIAGNOSIS — Z79899 Other long term (current) drug therapy: Secondary | ICD-10-CM

## 2019-08-29 DIAGNOSIS — I82443 Acute embolism and thrombosis of tibial vein, bilateral: Secondary | ICD-10-CM | POA: Diagnosis not present

## 2019-08-29 DIAGNOSIS — I10 Essential (primary) hypertension: Secondary | ICD-10-CM | POA: Diagnosis not present

## 2019-08-29 DIAGNOSIS — Z87891 Personal history of nicotine dependence: Secondary | ICD-10-CM

## 2019-08-29 DIAGNOSIS — Z8546 Personal history of malignant neoplasm of prostate: Secondary | ICD-10-CM

## 2019-08-29 DIAGNOSIS — R0602 Shortness of breath: Secondary | ICD-10-CM | POA: Diagnosis not present

## 2019-08-29 DIAGNOSIS — Z923 Personal history of irradiation: Secondary | ICD-10-CM

## 2019-08-29 DIAGNOSIS — Z882 Allergy status to sulfonamides status: Secondary | ICD-10-CM

## 2019-08-29 DIAGNOSIS — J939 Pneumothorax, unspecified: Secondary | ICD-10-CM

## 2019-08-29 DIAGNOSIS — Z8249 Family history of ischemic heart disease and other diseases of the circulatory system: Secondary | ICD-10-CM

## 2019-08-29 DIAGNOSIS — Z833 Family history of diabetes mellitus: Secondary | ICD-10-CM

## 2019-08-29 DIAGNOSIS — M7989 Other specified soft tissue disorders: Secondary | ICD-10-CM | POA: Diagnosis not present

## 2019-08-29 DIAGNOSIS — J189 Pneumonia, unspecified organism: Secondary | ICD-10-CM | POA: Diagnosis not present

## 2019-08-29 DIAGNOSIS — R040 Epistaxis: Secondary | ICD-10-CM | POA: Diagnosis not present

## 2019-08-29 DIAGNOSIS — J439 Emphysema, unspecified: Secondary | ICD-10-CM | POA: Diagnosis not present

## 2019-08-29 DIAGNOSIS — Z888 Allergy status to other drugs, medicaments and biological substances status: Secondary | ICD-10-CM

## 2019-08-29 LAB — CREATININE, SERUM
Creatinine, Ser: 1.24 mg/dL (ref 0.61–1.24)
GFR calc Af Amer: >60 mL/min (ref >60)
GFR calc non Af Amer: 56 mL/min — ABNORMAL LOW (ref >60)

## 2019-08-29 LAB — COMPREHENSIVE METABOLIC PANEL
ALT: 40 U/L (ref 0–44)
AST: 54 U/L — ABNORMAL HIGH (ref 15–41)
Albumin: 2.9 g/dL — ABNORMAL LOW (ref 3.5–5.0)
Alkaline Phosphatase: 59 U/L (ref 38–126)
Anion gap: 17 — ABNORMAL HIGH (ref 5–15)
BUN: 38 mg/dL — ABNORMAL HIGH (ref 8–23)
CO2: 20 mmol/L — ABNORMAL LOW (ref 22–32)
Calcium: 9.3 mg/dL (ref 8.9–10.3)
Chloride: 102 mmol/L (ref 98–111)
Creatinine, Ser: 1.69 mg/dL — ABNORMAL HIGH (ref 0.61–1.24)
GFR calc Af Amer: 44 mL/min — ABNORMAL LOW (ref >60)
GFR calc non Af Amer: 38 mL/min — ABNORMAL LOW (ref >60)
Glucose, Bld: 152 mg/dL — ABNORMAL HIGH (ref 70–99)
Potassium: 4.5 mmol/L (ref 3.5–5.1)
Sodium: 139 mmol/L (ref 135–145)
Total Bilirubin: 0.5 mg/dL (ref 0.3–1.2)
Total Protein: 7.4 g/dL (ref 6.5–8.1)

## 2019-08-29 LAB — ABO/RH: ABO/RH(D): A POS

## 2019-08-29 LAB — LACTIC ACID, PLASMA
Lactic Acid, Venous: 3.5 mmol/L (ref 0.5–1.9)
Lactic Acid, Venous: 1.9 mmol/L (ref 0.5–1.9)

## 2019-08-29 LAB — CBC WITH DIFFERENTIAL/PLATELET
Abs Immature Granulocytes: 0.05 10*3/uL (ref 0.00–0.07)
Basophils Absolute: 0 10*3/uL (ref 0.0–0.1)
Basophils Relative: 0 %
Eosinophils Absolute: 0 10*3/uL (ref 0.0–0.5)
Eosinophils Relative: 0 %
HCT: 31.6 % — ABNORMAL LOW (ref 39.0–52.0)
Hemoglobin: 10.5 g/dL — ABNORMAL LOW (ref 13.0–17.0)
Immature Granulocytes: 1 %
Lymphocytes Relative: 5 %
Lymphs Abs: 0.5 10*3/uL — ABNORMAL LOW (ref 0.7–4.0)
MCH: 30.9 pg (ref 26.0–34.0)
MCHC: 33.2 g/dL (ref 30.0–36.0)
MCV: 92.9 fL (ref 80.0–100.0)
Monocytes Absolute: 0.4 10*3/uL (ref 0.1–1.0)
Monocytes Relative: 4 %
Neutro Abs: 7.8 10*3/uL — ABNORMAL HIGH (ref 1.7–7.7)
Neutrophils Relative %: 90 %
Platelets: 253 10*3/uL (ref 150–400)
RBC: 3.4 MIL/uL — ABNORMAL LOW (ref 4.22–5.81)
RDW: 13.8 % (ref 11.5–15.5)
WBC: 8.7 10*3/uL (ref 4.0–10.5)
nRBC: 0 % (ref 0.0–0.2)

## 2019-08-29 LAB — SARS CORONAVIRUS 2 BY RT PCR (HOSPITAL ORDER, PERFORMED IN ~~LOC~~ HOSPITAL LAB): SARS Coronavirus 2: POSITIVE — AB

## 2019-08-29 LAB — TRIGLYCERIDES: Triglycerides: 106 mg/dL (ref <150)

## 2019-08-29 LAB — CBC
HCT: 26.6 % — ABNORMAL LOW (ref 39.0–52.0)
Hemoglobin: 8.7 g/dL — ABNORMAL LOW (ref 13.0–17.0)
MCH: 31.1 pg (ref 26.0–34.0)
MCHC: 32.7 g/dL (ref 30.0–36.0)
MCV: 95 fL (ref 80.0–100.0)
Platelets: 203 10*3/uL (ref 150–400)
RBC: 2.8 MIL/uL — ABNORMAL LOW (ref 4.22–5.81)
RDW: 13.9 % (ref 11.5–15.5)
WBC: 6.8 10*3/uL (ref 4.0–10.5)
nRBC: 0 % (ref 0.0–0.2)

## 2019-08-29 LAB — C-REACTIVE PROTEIN: CRP: 13.5 mg/dL — ABNORMAL HIGH (ref <1.0)

## 2019-08-29 LAB — PROCALCITONIN: Procalcitonin: <0.1 ng/mL

## 2019-08-29 LAB — FIBRINOGEN: Fibrinogen: 710 mg/dL — ABNORMAL HIGH (ref 210–475)

## 2019-08-29 LAB — LACTATE DEHYDROGENASE: LDH: 320 U/L — ABNORMAL HIGH (ref 98–192)

## 2019-08-29 LAB — FERRITIN: Ferritin: 484 ng/mL — ABNORMAL HIGH (ref 24–336)

## 2019-08-29 LAB — D-DIMER, QUANTITATIVE: D-Dimer, Quant: 1.23 ug/mL-FEU — ABNORMAL HIGH (ref 0.00–0.50)

## 2019-08-29 MED ORDER — NITROGLYCERIN 0.4 MG SL SUBL
0.4000 mg | SUBLINGUAL_TABLET | SUBLINGUAL | Status: DC | PRN
  Administered 2019-09-04 (×3): 0.4 mg via SUBLINGUAL
  Filled 2019-08-29: qty 1

## 2019-08-29 MED ORDER — LATANOPROST 0.005 % OP SOLN
1.0000 [drp] | Freq: Every day | OPHTHALMIC | Status: DC
  Administered 2019-08-29 – 2019-09-09 (×12): 1 [drp] via OPHTHALMIC
  Filled 2019-08-29: qty 2.5

## 2019-08-29 MED ORDER — BARICITINIB 2 MG PO TABS
2.0000 mg | ORAL_TABLET | Freq: Every day | ORAL | Status: DC
  Administered 2019-08-29: 2 mg via ORAL
  Filled 2019-08-29 (×2): qty 1

## 2019-08-29 MED ORDER — SODIUM CHLORIDE 0.9 % IV SOLN: INTRAVENOUS | Status: DC

## 2019-08-29 MED ORDER — SODIUM CHLORIDE 0.9 % IV SOLN: 100.0000 mg | Freq: Every day | INTRAVENOUS | Status: DC

## 2019-08-29 MED ORDER — SODIUM CHLORIDE 0.9 % IV BOLUS
1000.0000 mL | Freq: Once | INTRAVENOUS | Status: AC
  Administered 2019-08-29: 1000 mL via INTRAVENOUS

## 2019-08-29 MED ORDER — BARICITINIB 2 MG PO TABS
4.0000 mg | ORAL_TABLET | Freq: Every day | ORAL | Status: DC
  Administered 2019-08-30 – 2019-09-10 (×12): 4 mg via ORAL
  Filled 2019-08-29 (×14): qty 2

## 2019-08-29 MED ORDER — ENOXAPARIN SODIUM 40 MG/0.4ML ~~LOC~~ SOLN
40.0000 mg | SUBCUTANEOUS | Status: DC
  Administered 2019-08-29 – 2019-08-30 (×2): 40 mg via SUBCUTANEOUS
  Filled 2019-08-29 (×2): qty 0.4

## 2019-08-29 MED ORDER — ASPIRIN EC 81 MG PO TBEC
81.0000 mg | DELAYED_RELEASE_TABLET | Freq: Every day | ORAL | Status: DC
  Administered 2019-08-29 – 2019-09-10 (×13): 81 mg via ORAL
  Filled 2019-08-29 (×13): qty 1

## 2019-08-29 MED ORDER — ROSUVASTATIN CALCIUM 10 MG PO TABS
10.0000 mg | ORAL_TABLET | ORAL | Status: DC
  Administered 2019-08-31 – 2019-09-07 (×3): 10 mg via ORAL
  Filled 2019-08-29 (×5): qty 1

## 2019-08-29 MED ORDER — FENOFIBRATE 160 MG PO TABS
160.0000 mg | ORAL_TABLET | Freq: Every day | ORAL | Status: DC
  Administered 2019-08-30 – 2019-09-10 (×12): 160 mg via ORAL
  Filled 2019-08-29 (×14): qty 1

## 2019-08-29 MED ORDER — HYDROCOD POLST-CPM POLST ER 10-8 MG/5ML PO SUER
5.0000 mL | Freq: Two times a day (BID) | ORAL | Status: DC | PRN
  Administered 2019-08-30 – 2019-09-07 (×5): 5 mL via ORAL
  Filled 2019-08-29 (×5): qty 5

## 2019-08-29 MED ORDER — ALBUTEROL SULFATE HFA 108 (90 BASE) MCG/ACT IN AERS
INHALATION_SPRAY | RESPIRATORY_TRACT | Status: AC
  Administered 2019-08-29: 2 via RESPIRATORY_TRACT
  Filled 2019-08-29: qty 6.7

## 2019-08-29 MED ORDER — SODIUM CHLORIDE 0.9 % IV SOLN
100.0000 mg | Freq: Every day | INTRAVENOUS | Status: AC
  Administered 2019-08-30 – 2019-09-02 (×4): 100 mg via INTRAVENOUS
  Filled 2019-08-29 (×5): qty 20

## 2019-08-29 MED ORDER — GUAIFENESIN-DM 100-10 MG/5ML PO SYRP
10.0000 mL | ORAL_SOLUTION | ORAL | Status: DC | PRN
  Administered 2019-09-07: 10 mL via ORAL
  Filled 2019-08-29: qty 10

## 2019-08-29 MED ORDER — DORZOLAMIDE HCL 2 % OP SOLN
1.0000 [drp] | Freq: Two times a day (BID) | OPHTHALMIC | Status: DC
  Administered 2019-08-29 – 2019-09-10 (×20): 1 [drp] via OPHTHALMIC
  Filled 2019-08-29 (×2): qty 10

## 2019-08-29 MED ORDER — POLYETHYLENE GLYCOL 3350 17 G PO PACK: 17.0000 g | PACK | Freq: Every day | ORAL | Status: DC | PRN

## 2019-08-29 MED ORDER — METHYLPREDNISOLONE SODIUM SUCC 125 MG IJ SOLR
0.5000 mg/kg | Freq: Two times a day (BID) | INTRAMUSCULAR | Status: DC
  Administered 2019-08-29 – 2019-08-31 (×4): 50 mg via INTRAVENOUS
  Filled 2019-08-29 (×4): qty 2

## 2019-08-29 MED ORDER — SODIUM CHLORIDE 0.9 % IV SOLN
200.0000 mg | Freq: Once | INTRAVENOUS | Status: AC
  Administered 2019-08-29: 200 mg via INTRAVENOUS
  Filled 2019-08-29: qty 200

## 2019-08-29 MED ORDER — LACTATED RINGERS IV BOLUS: 1000.0000 mL | Freq: Once | INTRAVENOUS | Status: DC

## 2019-08-29 MED ORDER — BARICITINIB 2 MG PO TABS: 4.0000 mg | ORAL_TABLET | Freq: Every day | ORAL | Status: DC

## 2019-08-29 MED ORDER — DEXAMETHASONE SODIUM PHOSPHATE 10 MG/ML IJ SOLN
6.0000 mg | Freq: Once | INTRAMUSCULAR | Status: AC
  Administered 2019-08-29: 6 mg via INTRAVENOUS
  Filled 2019-08-29: qty 1

## 2019-08-29 MED ORDER — BRIMONIDINE TARTRATE 0.15 % OP SOLN
1.0000 [drp] | Freq: Two times a day (BID) | OPHTHALMIC | Status: DC
  Administered 2019-08-29 – 2019-09-10 (×20): 1 [drp] via OPHTHALMIC
  Filled 2019-08-29 (×2): qty 5

## 2019-08-29 MED ORDER — SODIUM CHLORIDE 0.9 % IV SOLN
200.0000 mg | Freq: Once | INTRAVENOUS | Status: DC
  Filled 2019-08-29: qty 40

## 2019-08-29 MED ORDER — ALBUTEROL SULFATE HFA 108 (90 BASE) MCG/ACT IN AERS
2.0000 | INHALATION_SPRAY | Freq: Four times a day (QID) | RESPIRATORY_TRACT | Status: DC
  Administered 2019-08-30 – 2019-09-10 (×43): 2 via RESPIRATORY_TRACT
  Filled 2019-08-29 (×2): qty 6.7

## 2019-08-29 MED ORDER — ALFUZOSIN HCL ER 10 MG PO TB24
10.0000 mg | ORAL_TABLET | Freq: Every day | ORAL | Status: DC
  Administered 2019-08-30 – 2019-09-10 (×12): 10 mg via ORAL
  Filled 2019-08-29 (×14): qty 1

## 2019-08-29 NOTE — ED Notes (Signed)
Dinner tray given

## 2019-08-29 NOTE — ED Provider Notes (Signed)
[facility] DEPT Provider Note   CSN: [id number] Arrival date & time: 08/25/2019  0932     History Chief Complaint  Patient presents with  . Shortness of Breath    David Cantrell is a 79 y.o. male.  HPI   78yM with dyspnea. Began having symptoms 08/19/19. Cough, congestion, chills and body aches. Reportedly tested positive through [employer] several days later. Set up for MAB infusion today and noted to be tahcypneic, tachycardic and hypoxic in the 40s on RA so sent to the ER. Feeling better currently but on significant oxygen. Significant hx including COPD/bronchiectasis.   Past Medical History:  Diagnosis Date  . Allergy   . Aortic ectasia (HCC)    a. 2.5cm aortic ectasia by CT 05/2017 (follow-up due 05/2022).  Marland Kitchen Arthritis   . Bronchiectasis ([place])    per pulmologist note -- dr [place] Bells-- related to multiple episodes pneumonia as adult  . CAD (coronary artery disease)    a. L PDA obstructive disease 2012, managed medically.  . Cancer ([place])    basal cell ca  . Cataract   . Centrilobular emphysema ([place])   . Congenital renal cyst   . COPD (chronic obstructive pulmonary disease) (HCC)    pulmologist-  dr [place] Bells  . Diverticulosis of colon   . Dyspnea    upon exerting self -beengoing on 10 yrs.  . Emphysema of lung ([place])   . First degree heart block   . Hemorrhoids   . History of adenomatous polyp of colon    hx multiple tubular adenoma's and hyperplastic polyp's since 2001--- last colonoscopy 05-29-2015  . History of basal cell carcinoma excision    10-31-2012  nose  . History of external beam radiation therapy 02-01-2016  to 03-16-2016   prostate 45Gy  plus radioactive prostate seed implants 04-07-2016  . History of kidney stones   . Hyperlipidemia   . Hypertension   . 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 Multiple pulmonary nodules    S/P  BILATERAL BRONCHOSCOPY  09-24-2014  no infection --    . Open-angle glaucoma    RIGHT EYE  ONLY/OPEN ANGLE  . Pneumonia    x3  . Prostate cancer [facility]) urologist-  dr eskridge/  oncologist-  dr Tammi Klippel   Stage T1c,  Gleason3+4, PSA 4.7--  s/p  external beam radiation therapy 02-01-2016 to 03-16-2016  . Seasonal allergies   . Thoracic aortic aneurysm ([place])    a. [name] following -4.3cm by CT 06/2018.  Marland Kitchen Wears dentures    full upper and lower parital   Patient Active Problem List   Diagnosis Date Noted  . Upper respiratory symptom 08/22/2019  . Cervical radiculopathy 03/01/2019  . Preop examination 03/01/2019  . Shortness of breath 07/10/2018  . Lightheadedness 07/10/2018  . Sinus bradycardia 06/28/2018  . CKD (chronic kidney disease) stage 3, GFR 30-59 ml/min   . Diverticulitis 10/11/2017  . Lumbar back pain 04/13/2017  . Piriformis syndrome 01/18/2017  . Thoracic aortic aneurysm ([place]) 04/12/2016  . Malignant neoplasm of prostate ([place]) 01/31/2016  . Centrilobular emphysema ([place]) 12/12/2014  . Bronchiectasis without acute exacerbation ([place]) 09/11/2014  . Bulging lumbar disc 10/03/2013  . Open-angle glaucoma 05/05/2013  . 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 Multiple pulmonary nodules 02/24/2013  . Basal cell carcinoma of ala nasi 02/24/2013  . At risk for obstructive sleep apnea 02/09/2013  . 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 COLONIC POLYPS, HX OF 12/13/2007  . Hyperlipidemia  12/03/2006  . Essential hypertension 12/03/2006  . Coronary atherosclerosis 12/03/2006  . 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 Nevus, non-neoplastic 09/24/2006  . RHINITIS, ALLERGIC NOS 09/24/2006  . Exercise-induced bronchospasm 09/24/2006    Past Surgical History:  Procedure Laterality Date  . ANTERIOR CERVICAL DECOMP/DISCECTOMY FUSION N/A 03/15/2019   Procedure: ANTERIOR CERVICAL DECOMPRESSION/DISCECTOMY FUSION CERVICAL FIVE-CERVICAL SIX, CERVICAL SIX- CERVICAL SEVEN;  Surgeon: Melina Schools, MD;  Location: [facility];  Service: Orthopedics;  Laterality:  N/A;  4 hrs  . CARDIAC CATHETERIZATION  09-04-2002  DR Wynonia Lawman   NORMAL LVF/  MILD TO MODERATE CAD INVOLVING PROXIMAL 40%  AND MID 40%  LAD/  POSTERIOR DESCENDING CX 70%  . CARDIAC CATHETERIZATION  06-05-2010  DR Fidela Juneau   NORMAL LM/  40% PROXIMAL MID LAD/ 70% DISTAL CFX/  80% PROXIMAL PDA/  SMALL & NONDOMINANT RCA  . CARDIOVASCULAR STRESS TEST  06-01-2013   dr Wynonia Lawman   normal lexiscan myoview w/ no ischemia/ normal LV function and wall motion, ef 61%  . COLONOSCOPY    . COLONOSCOPY W/ POLYPECTOMY  last one 05-29-2015  . CYSTOSCOPY N/A 04/07/2016   Procedure: CYSTOSCOPY FLEXIBLE;  Surgeon: Festus Aloe, MD;  Location: [facility];  Service: Urology;  Laterality: N/A;  no seeds found in bladder  . CYSTOSCOPY WITH RETROGRADE PYELOGRAM, URETEROSCOPY AND STENT PLACEMENT Right 02/02/2013   Procedure: CYSTOSCOPY WITH RETROGRADE PYELOGRAM, AND Right STENT PLACEMENT;  Surgeon: Molli Hazard, MD;  Location: [facility];  Service: Urology;  Laterality: Right;  . CYSTOSCOPY WITH RETROGRADE PYELOGRAM, URETEROSCOPY AND STENT PLACEMENT Right 02/15/2013   Procedure: CYSTOSCOPY WITH RETROGRADE PYELOGRAM, URETEROSCOPY AND STENT EXCHANGE;  Surgeon: Molli Hazard, MD;  Location: [facility];  Service: Urology;  Laterality: Right;  . ESI     ; for cervical radiculopathy  . HEMORRHOID SURGERY  07-03-1999  . HOLMIUM LASER APPLICATION Right 05/14/9765   Procedure: HOLMIUM LASER APPLICATION;  Surgeon: Molli Hazard, MD;  Location: [facility];  Service: Urology;  Laterality: Right;  . LEFT HEART CATH AND CORONARY ANGIOGRAPHY N/A 06/28/2018   Procedure: LEFT HEART CATH AND CORONARY ANGIOGRAPHY;  Surgeon: Martinique, Peter M, MD;  Location: [facility];  Service: Cardiovascular;  Laterality: N/A;  . POLYPECTOMY    . PROSTATE BIOPSY    . RADIOACTIVE SEED IMPLANT N/A 04/07/2016   Procedure: RADIOACTIVE SEED IMPLANT/BRACHYTHERAPY IMPLANT;  Surgeon: Festus Aloe, MD;  Location: [facility];  Service: Urology;  Laterality: N/A;  55 seeds implanted  . SHOULDER OPEN ROTATOR CUFF REPAIR Bilateral RIGHT X2  LAST ONE 1997/  LEFT 2005   right inferior glenoid screw retained  . VIDEO BRONCHOSCOPY Bilateral 09/24/2014   Procedure: VIDEO BRONCHOSCOPY WITH FLUORO;  Surgeon: Juanito Doom, MD;  Location: [facility];  Service: Cardiopulmonary;  Laterality: Bilateral;     Family History  Problem Relation Age of Onset  . Stroke Father        in 42s  . Colon cancer Father        upper 59's  . Cancer Father        colon  . Heart attack Brother 86       stent  . Cancer Brother        pre cancerous bladder lesion  . Diabetes Paternal Uncle   . Cancer Cousin        prostate  . Cancer Other        bladder  .  Colon polyps Sister   . Colon polyps Brother   . Colon polyps Brother   . Arthritis Neg Hx   . Asthma Neg Hx   . COPD Neg Hx   . Rectal cancer Neg Hx   . Stomach cancer Neg Hx   . Esophageal cancer Neg Hx    Social History   Tobacco Use  . Smoking status: Former Smoker    Packs/day: 2.00    Years: 3.00    Pack years: 6.00    Types: Cigarettes    Quit date: 01/12/1970    Years since quitting: 49.6  . Smokeless tobacco: Never Used  Vaping Use  . Vaping Use: Never used  Substance Use Topics  . Alcohol use: No    Alcohol/week: 0.0 standard drinks  . Drug use: No    Home Medications Prior to Admission medications   Medication Sig Start Date End Date Taking? Authorizing Provider  alfuzosin (UROXATRAL) 10 MG 24 hr tablet Take 10 mg by mouth daily. 02/09/19  Yes [provider]  aspirin EC 81 MG tablet Take 81 mg by mouth daily. Swallow whole.   Yes [provider]  bimatoprost (LUMIGAN) 0.01 % SOLN Place 1 drop into both eyes at bedtime.    Yes [provider]  brimonidine (ALPHAGAN) 0.15 % ophthalmic solution Place 1 drop into the right eye 2 (two) times daily.  02/08/16  Yes [provider]  Cholecalciferol (VITAMIN D) 50 MCG (2000 UT) CAPS Take 2,000 Units by mouth 2 (two) times daily after a meal.   Yes [provider]  dorzolamide (TRUSOPT) 2 % ophthalmic solution Place 1 drop into the right eye 2 (two) times daily.    Yes [provider]  doxycycline (VIBRA-TABS) 100 MG tablet Take 100 mg by mouth 2 (two) times daily. 08/25/19  Yes Leisa Lenz, MD  fenofibrate 160 MG tablet Take 1 tablet by mouth once daily Patient taking differently: Take 160 mg by mouth daily.  02/20/19  Yes Martinique, Peter M, MD  GARLIC PO Take 1 tablet by mouth 2 (two) times daily.   Yes [provider]  ivermectin (STROMECTOL) 3 MG TABS tablet Take 21 mg by mouth daily. 08/25/19  Yes Leisa Lenz, MD  Multiple Vitamin (MULTIVITAMIN WITH MINERALS) TABS tablet Take 1 tablet by mouth daily.   Yes [provider]  Multiple Vitamins-Minerals (PRESERVISION AREDS 2 PO) Take 1 tablet by mouth in the morning and at bedtime.   Yes [provider]  Omega-3 Fatty Acids (FISH OIL PO) Take 1 capsule by mouth daily.   Yes [provider]  predniSONE (STERAPRED UNI-PAK 48 TAB) 5 MG (48) TBPK tablet Take by mouth as directed. 08/25/19  Yes Leisa Lenz, MD  PROAIR HFA 108 [phone] Base) MCG/ACT inhaler Inhale 1-2 puffs into the lungs every 6 (six) hours as needed for wheezing or shortness of breath. 07/07/18  Yes Burns, Claudina Lick, MD  rosuvastatin (CRESTOR) 10 MG tablet TAKE 1 TABLET BY MOUTH TWICE  A WEEK. MONDAY AND THURSDAY. Patient taking differently: Take 10 mg by mouth as directed. Monday and Thursday 05/22/19  Yes Martinique, Peter M, MD  fluticasone furoate-vilanterol (BREO ELLIPTA) 200-25 MCG/INH AEPB Inhale 1 puff into the lungs daily. Patient not taking: Reported on 08/21/2019 11/23/18   [name], MD  hyoscyamine (LEVSIN SL) 0.125 MG SL tablet PLACE 1 TABLET UNDER THE TONGUE AS NEEDED Patient not taking: Reported on 09/12/2019 09/21/18   Pyrtle, Lajuan Lines, MD  nitroGLYCERIN (NITROSTAT) 0.4 MG SL tablet Place 1 tablet (0.4 mg total) under the tongue every 5 (five) minutes as needed for chest pain. 02/16/19   Martinique, Peter M, MD  ondansetron (ZOFRAN) 4 MG tablet Take 1 tablet (4 mg total) by mouth every 8 (eight) hours as needed for nausea or vomiting. Patient not taking: Reported on 08/24/2019 03/15/19   Melina Schools, MD    Allergies    Aspirin, Doxycycline, Fish oil, Lipitor [atorvastatin], Morphine and related, Niacin and related, Ramipril, Sulfa antibiotics, and Codeine  Review of Systems   Review of Systems All systems reviewed and negative, other than as noted in HPI.  Physical Exam Updated Vital Signs BP 105/69   Pulse 84   Temp (!) 97.5 F (36.4 C) (Oral)   Resp (!) 29   Ht 6\' 4"  (1.93 m)   Wt 99.8 kg   SpO2 (!) 85%   BMI 26.78 kg/m   Physical Exam Vitals and nursing note reviewed.  Constitutional:      Appearance: He is well-developed. He is ill-appearing.     Comments: Laying in bed.  Tired appearing, but not toxic.    HENT:     Head: Normocephalic and atraumatic.  Eyes:     General:        Right eye: No discharge.        Left eye: No discharge.     Conjunctiva/sclera: Conjunctivae normal.  Cardiovascular:     Rate and Rhythm: Regular rhythm. Tachycardia present.     Heart sounds: Normal heart sounds. No murmur heard.  No friction rub. No gallop.   Pulmonary:     Effort: Respiratory distress present.  Abdominal:     General: There is no distension.     Palpations: Abdomen is soft.     Tenderness: There is no abdominal tenderness.  Musculoskeletal:        General: No tenderness.     Cervical back: Neck supple.  Skin:    General: Skin is warm and dry.  Neurological:     Mental Status: He is alert.  Psychiatric:        Behavior: Behavior normal.        Thought Content: Thought content normal.     ED Results / Procedures / Treatments   Labs (all labs ordered are listed, but only abnormal results are  displayed) Labs Reviewed  SARS CORONAVIRUS 2 BY RT PCR ([facility]) - Abnormal; Notable for the following components:      Result Value   SARS Coronavirus 2 POSITIVE (*)    All other components within normal limits  LACTIC ACID, PLASMA - Abnormal; Notable for the following components:   Lactic Acid, Venous 3.5 (*)    All other components within normal limits  CBC WITH DIFFERENTIAL/PLATELET - Abnormal; Notable for the following components:   RBC 3.40 (*)    Hemoglobin 10.5 (*)    HCT 31.6 (*)    Neutro Abs 7.8 (*)    Lymphs Abs 0.5 (*)    All other components within normal limits  COMPREHENSIVE METABOLIC PANEL - Abnormal; Notable for the following components:   CO2 20 (*)    Glucose, Bld 152 (*)    BUN 38 (*)    Creatinine, Ser 1.69 (*)    Albumin 2.9 (*)    AST 54 (*)    GFR calc non Af Amer 38 (*)    GFR calc Af Amer 44 (*)  Anion gap 17 (*)    All other components within normal limits  D-DIMER, QUANTITATIVE (NOT AT [facility]) - Abnormal; Notable for the following components:   D-Dimer, Quant 1.23 (*)    All other components within normal limits  LACTATE DEHYDROGENASE - Abnormal; Notable for the following components:   LDH 320 (*)    All other components within normal limits  FERRITIN - Abnormal; Notable for the following components:   Ferritin 484 (*)    All other components within normal limits  FIBRINOGEN - Abnormal; Notable for the following components:   Fibrinogen 710 (*)    All other components within normal limits  C-REACTIVE PROTEIN - Abnormal; Notable for the following components:   CRP 13.5 (*)    All other components within normal limits  CULTURE, BLOOD (ROUTINE X 2)  CULTURE, BLOOD (ROUTINE X 2)  PROCALCITONIN  TRIGLYCERIDES  LACTIC ACID, PLASMA    EKG EKG Interpretation  Date/Time:  Tuesday August 29 2019 09:39:06 EDT Ventricular Rate:  86 PR Interval:    QRS Duration: 95 QT Interval:  365 QTC  Calculation: 437 R Axis:   -22 Text Interpretation: Sinus rhythm Prolonged PR interval Borderline left axis deviation Borderline T abnormalities, anterior leads Confirmed by Virgel Manifold [phone]) on 08/24/2019 10:37:57 AM   Radiology DG Chest Port 1 View  Result Date: 09/09/2019 CLINICAL DATA:  Hypoxia, COVID positive EXAM: PORTABLE CHEST 1 VIEW COMPARISON:  CT chest dated 06/28/2019 FINDINGS: Multifocal patchy airspace opacities in the lungs bilaterally, lower lung predominant. No pleural effusion or pneumothorax. The heart is normal in size. Cervical spine fixation hardware. IMPRESSION: Multifocal pneumonia in this patient with known COVID, lower lung predominant. Electronically Signed   By: Julian Hy M.D.   On: 08/13/2019 10:31    Procedures Procedures (including critical care time)  CRITICAL CARE Performed by: Virgel Manifold Total critical care time: 35 minutes Critical care time was exclusive of separately billable procedures and treating other patients. Critical care was necessary to treat or prevent imminent or life-threatening deterioration. Critical care was time spent personally by me on the following activities: development of treatment plan with patient and/or surrogate as well as nursing, discussions with consultants, evaluation of patient's response to treatment, examination of patient, obtaining history from patient or surrogate, ordering and performing treatments and interventions, ordering and review of laboratory studies, ordering and review of radiographic studies, pulse oximetry and re-evaluation of patient's condition.   Medications Ordered in ED Medications  remdesivir 200 mg in sodium chloride 0.9% 250 mL IVPB (0 mg Intravenous Stopped 08/18/2019 1731)    Followed by  remdesivir 100 mg in sodium chloride 0.9 % 100 mL IVPB (0 mg Intravenous Stopped 08/30/19 1026)  alfuzosin (UROXATRAL) 24 hr tablet 10 mg (10 mg Oral Given 08/30/19 0954)  aspirin EC tablet 81 mg (81  mg Oral Given 08/30/19 0952)  latanoprost (XALATAN) 0.005 % ophthalmic solution 1 drop (1 drop Both Eyes Given 08/30/19 2116)  brimonidine (ALPHAGAN) 0.15 % ophthalmic solution 1 drop (1 drop Right Eye Given 08/30/19 2116)  dorzolamide (TRUSOPT) 2 % ophthalmic solution 1 drop (1 drop Right Eye Given 08/30/19 2117)  fenofibrate tablet 160 mg (160 mg Oral Given 08/30/19 0954)  nitroGLYCERIN (NITROSTAT) SL tablet 0.4 mg (has no administration in time range)  rosuvastatin (CRESTOR) tablet 10 mg (has no administration in time range)  enoxaparin (LOVENOX) injection 40 mg (40 mg Subcutaneous Given 08/30/19 2118)  methylPREDNISolone sodium succinate (SOLU-MEDROL) 125 mg/2 mL injection 50 mg (50 mg Intravenous  Given 08/31/19 0332)  baricitinib (OLUMIANT) tablet 4 mg (4 mg Oral Given 08/30/19 0953)  guaiFENesin-dextromethorphan (ROBITUSSIN DM) 100-10 MG/5ML syrup 10 mL (has no administration in time range)  chlorpheniramine-HYDROcodone (TUSSIONEX) 10-8 MG/5ML suspension 5 mL (5 mLs Oral Given 08/30/19 1146)  polyethylene glycol (MIRALAX / GLYCOLAX) packet 17 g (has no administration in time range)  albuterol (VENTOLIN HFA) 108 (90 Base) MCG/ACT inhaler 2 puff (2 puffs Inhalation Given 08/31/19 0245)  dexamethasone (DECADRON) injection 6 mg (6 mg Intravenous Given 08/20/2019 1340)  sodium chloride 0.9 % bolus 1,000 mL (0 mLs Intravenous Stopped 08/24/2019 1731)  furosemide (LASIX) injection 40 mg (40 mg Intravenous Given 08/30/19 1148)  furosemide (LASIX) injection 40 mg (40 mg Intravenous Given 08/30/19 1839)    ED Course  I have reviewed the triage vital signs and the nursing notes.  Pertinent labs & imaging results that were available during my care of the patient were reviewed by me and considered in my medical decision making (see chart for details).    MDM Rules/Calculators/A&P                          56yM with COVID. Hypoxic with significant oxygen requirement. Steroids. Remdesivir. Admit.   David Cantrell  was evaluated in Emergency Department on 09/03/2019 for the symptoms described in the history of present illness. He was evaluated in the context of the global COVID-19 pandemic, which necessitated consideration that the patient might be at risk for infection with the SARS-CoV-2 virus that causes COVID-19. Institutional protocols and algorithms that pertain to the evaluation of patients at risk for COVID-19 are in a state of rapid change based on information released by regulatory bodies including the CDC and federal and state organizations. These policies and algorithms were followed during the patient's care in the ED.   Final Clinical Impression(s) / ED Diagnoses Final diagnoses:  COVID-19 virus infection    Rx / DC Orders ED Discharge Orders    None       Virgel Manifold, MD 09/03/19 [phone]

## 2019-08-29 NOTE — ED Notes (Signed)
Patient coming from the infusion clinic; covid +. Pt O2 stats in the mid 35s. Pt placed on NRB.

## 2019-08-29 NOTE — Discharge Instructions (Signed)

## 2019-08-29 NOTE — Progress Notes (Signed)
Patient arrived to outpatient infusion clinic for med. Patient noted hypoxic with O2 in the 40s, tachypneic, tachycardic, please see VS in flow sheet. Pt states he feels like he is about to black out. O2 applied, RRT at bedside. Nonrebreather applied with sat in the low 80s. ED with ready bed room 18. Will notified pt's spouse for admission. Patient verbalized he has not been vaccinated.   B/p 97/61 (74) Temp 99.2 RR 28 HR 101

## 2019-08-29 NOTE — ED Notes (Signed)
Patient received dinner tray 

## 2019-08-29 NOTE — H&P (Signed)
History and Physical  David Cantrell HTD:[id number] DOB: February 10, 1940 DOA: 09/06/2019  PCP: [name], MD Patient coming from: Home  I have personally briefly reviewed patient's old medical records in [place]   Chief Complaint: SOB  HPI: David Cantrell is a 79 y.o. male past medical history of COPD bronchiectasis essential hypertension chronic kidney disease stage IIIa who started having body aches on 08/19/2019 he then became short of breath when he got SARS-CoV-2 PCR positive David Cantrell came back positive on the 13th he was set up for [facility] infusion and noted to be tachypneic tachycardic and hypoxic in the 40s on room air was placed on 15 L of oxygen and transferred to the ED he relates he has been having a productive cough significant short of breath especially with ambulation tired weak and fatigued.  In the ED: High flow nasal cannula saturations improved to 96% now he is on a nonrebreather satting greater than 92%, as are stable, with a CRP of 13 lactic acid of 3.5 procalcitonin less than 0.1 hemoglobin of 10.5 white blood cell count of 8.7 SARS-CoV-2 PCR was positive, chest x-ray showed bilateral infiltrates   Review of Systems: All systems reviewed and apart from history of presenting illness, are negative.  Past Medical History:  Diagnosis Date  . Allergy   . Aortic ectasia (HCC)    a. 2.5cm aortic ectasia by CT 05/2017 (follow-up due 05/2022).  Marland Kitchen Arthritis   . Bronchiectasis ([place])    per pulmologist note -- dr Lake Bells-- related to multiple episodes pneumonia as adult  . CAD (coronary artery disease)    a. L PDA obstructive disease 2012, managed medically.  . Cancer ([place])    basal cell ca  . Cataract   . Centrilobular emphysema ([place])   . Congenital renal cyst   . COPD (chronic obstructive pulmonary disease) (HCC)    pulmologist-  dr Lake Bells  . Diverticulosis of colon   . Dyspnea    upon exerting self -beengoing on 10 yrs.  . Emphysema of lung ([place])   . First degree  heart block   . Hemorrhoids   . History of adenomatous polyp of colon    hx multiple tubular adenoma's and hyperplastic polyp's since 2001--- last colonoscopy 05-29-2015  . History of basal cell carcinoma excision    10-31-2012  nose  . History of external beam radiation therapy 02-01-2016  to 03-16-2016   prostate 45Gy  plus radioactive prostate seed implants 04-07-2016  . History of kidney stones   . Hyperlipidemia   . Hypertension   . 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 Multiple pulmonary nodules    S/P  BILATERAL BRONCHOSCOPY  09-24-2014  no infection --    . Open-angle glaucoma    RIGHT EYE ONLY/OPEN ANGLE  . Pneumonia    x3  . Prostate cancer [facility]) urologist-  dr eskridge/  oncologist-  dr Tammi Klippel   Stage T1c,  Gleason3+4, PSA 4.7--  s/p  external beam radiation therapy 02-01-2016 to 03-16-2016  . Seasonal allergies   . Thoracic aortic aneurysm ([place])    a. [name] following -4.3cm by CT 06/2018.  Marland Kitchen Wears dentures    full upper and lower parital   Past Surgical History:  Procedure Laterality Date  . ANTERIOR CERVICAL DECOMP/DISCECTOMY FUSION N/A 03/15/2019   Procedure: ANTERIOR CERVICAL DECOMPRESSION/DISCECTOMY FUSION CERVICAL FIVE-CERVICAL SIX, CERVICAL SIX- CERVICAL SEVEN;  Surgeon: Melina Schools, MD;  Location: [place];  Service:  Orthopedics;  Laterality: N/A;  4 hrs  . CARDIAC CATHETERIZATION  09-04-2002  DR Wynonia Lawman   NORMAL LVF/  MILD TO MODERATE CAD INVOLVING PROXIMAL 40%  AND MID 40%  LAD/  POSTERIOR DESCENDING CX 70%  . CARDIAC CATHETERIZATION  06-05-2010  DR Fidela Juneau   NORMAL LM/  40% PROXIMAL MID LAD/ 70% DISTAL CFX/  80% PROXIMAL PDA/  SMALL & NONDOMINANT RCA  . CARDIOVASCULAR STRESS TEST  06-01-2013   dr Wynonia Lawman   normal lexiscan myoview w/ no ischemia/ normal LV function and wall motion, ef 61%  . COLONOSCOPY    . COLONOSCOPY W/ POLYPECTOMY  last one 05-29-2015  . CYSTOSCOPY N/A 04/07/2016   Procedure: CYSTOSCOPY FLEXIBLE;  Surgeon: Festus Aloe, MD;   Location: [facility];  Service: Urology;  Laterality: N/A;  no seeds found in bladder  . CYSTOSCOPY WITH RETROGRADE PYELOGRAM, URETEROSCOPY AND STENT PLACEMENT Right 02/02/2013   Procedure: CYSTOSCOPY WITH RETROGRADE PYELOGRAM, AND Right STENT PLACEMENT;  Surgeon: Molli Hazard, MD;  Location: [facility];  Service: Urology;  Laterality: Right;  . CYSTOSCOPY WITH RETROGRADE PYELOGRAM, URETEROSCOPY AND STENT PLACEMENT Right 02/15/2013   Procedure: CYSTOSCOPY WITH RETROGRADE PYELOGRAM, URETEROSCOPY AND STENT EXCHANGE;  Surgeon: Molli Hazard, MD;  Location: [facility];  Service: Urology;  Laterality: Right;  . ESI     ; for cervical radiculopathy  . HEMORRHOID SURGERY  07-03-1999  . HOLMIUM LASER APPLICATION Right 09/16/6385   Procedure: HOLMIUM LASER APPLICATION;  Surgeon: Molli Hazard, MD;  Location: [facility];  Service: Urology;  Laterality: Right;  . LEFT HEART CATH AND CORONARY ANGIOGRAPHY N/A 06/28/2018   Procedure: LEFT HEART CATH AND CORONARY ANGIOGRAPHY;  Surgeon: Martinique, Peter M, MD;  Location: [facility];  Service: Cardiovascular;  Laterality: N/A;  . POLYPECTOMY    . PROSTATE BIOPSY    . RADIOACTIVE SEED IMPLANT N/A 04/07/2016   Procedure: RADIOACTIVE SEED IMPLANT/BRACHYTHERAPY IMPLANT;  Surgeon: Festus Aloe, MD;  Location: [facility];  Service: Urology;  Laterality: N/A;  55 seeds implanted  . SHOULDER OPEN ROTATOR CUFF REPAIR Bilateral RIGHT X2  LAST ONE 1997/  LEFT 2005   right inferior glenoid screw retained  . VIDEO BRONCHOSCOPY Bilateral 09/24/2014   Procedure: VIDEO BRONCHOSCOPY WITH FLUORO;  Surgeon: Juanito Doom, MD;  Location: [place];  Service: Cardiopulmonary;  Laterality: Bilateral;   Social History:  reports that he quit smoking about 49 years ago. His smoking use included cigarettes. He has a 6.00 pack-year smoking history. He has never used smokeless tobacco. He reports  that he does not drink alcohol and does not use drugs.   Allergies  Allergen Reactions  . Aspirin   . Doxycycline Other (See Comments)    Dizziness   . Fish Oil   . Lipitor [Atorvastatin] Other (See Comments)    MUSCLE ACHES  . Morphine And Related Other (See Comments)    Severe headache  . Niacin And Related Other (See Comments)    FLUSHING  . Ramipril Cough  . Sulfa Antibiotics Hives  . Codeine Other (See Comments)    CONSTIPATION    Family History  Problem Relation Age of Onset  . Stroke Father        in 38s  . Colon cancer Father        upper 51's  . Cancer Father        colon  . Heart attack Brother 64       stent  .  Cancer Brother        pre cancerous bladder lesion  . Diabetes Paternal Uncle   . Cancer Cousin        prostate  . Cancer Other        bladder  . Colon polyps Sister   . Colon polyps Brother   . Colon polyps Brother   . Arthritis Neg Hx   . Asthma Neg Hx   . COPD Neg Hx   . Rectal cancer Neg Hx   . Stomach cancer Neg Hx   . Esophageal cancer Neg Hx     Prior to Admission medications   Medication Sig Start Date End Date Taking? Authorizing Provider  alfuzosin (UROXATRAL) 10 MG 24 hr tablet Take 10 mg by mouth daily. 02/09/19  Yes [provider]  aspirin EC 81 MG tablet Take 81 mg by mouth daily. Swallow whole.   Yes [provider]  bimatoprost (LUMIGAN) 0.01 % SOLN Place 1 drop into both eyes at bedtime.    Yes [provider]  brimonidine (ALPHAGAN) 0.15 % ophthalmic solution Place 1 drop into the right eye 2 (two) times daily.  02/08/16  Yes [provider]  Cholecalciferol (VITAMIN D) 50 MCG (2000 UT) CAPS Take 2,000 Units by mouth 2 (two) times daily after a meal.   Yes [provider]  dorzolamide (TRUSOPT) 2 % ophthalmic solution Place 1 drop into the right eye 2 (two) times daily.    Yes [provider]  doxycycline (VIBRA-TABS) 100 MG tablet Take 100 mg by mouth 2 (two) times  daily. 08/25/19  Yes Leisa Lenz, MD  fenofibrate 160 MG tablet Take 1 tablet by mouth once daily Patient taking differently: Take 160 mg by mouth daily.  02/20/19  Yes Martinique, Peter M, MD  GARLIC PO Take 1 tablet by mouth 2 (two) times daily.   Yes [provider]  ivermectin (STROMECTOL) 3 MG TABS tablet Take 21 mg by mouth daily. 08/25/19  Yes Leisa Lenz, MD  Multiple Vitamin (MULTIVITAMIN WITH MINERALS) TABS tablet Take 1 tablet by mouth daily.   Yes [provider]  Multiple Vitamins-Minerals (PRESERVISION AREDS 2 PO) Take 1 tablet by mouth in the morning and at bedtime.   Yes [provider]  Omega-3 Fatty Acids (FISH OIL PO) Take 1 capsule by mouth daily.   Yes [provider]  predniSONE (STERAPRED UNI-PAK 48 TAB) 5 MG (48) TBPK tablet Take by mouth as directed. 08/25/19  Yes Leisa Lenz, MD  PROAIR HFA 108 [phone] Base) MCG/ACT inhaler Inhale 1-2 puffs into the lungs every 6 (six) hours as needed for wheezing or shortness of breath. 07/07/18  Yes Burns, Claudina Lick, MD  rosuvastatin (CRESTOR) 10 MG tablet TAKE 1 TABLET BY MOUTH TWICE  A WEEK. MONDAY AND THURSDAY. Patient taking differently: Take 10 mg by mouth as directed. Monday and Thursday 05/22/19  Yes Martinique, Peter M, MD  fluticasone furoate-vilanterol (BREO ELLIPTA) 200-25 MCG/INH AEPB Inhale 1 puff into the lungs daily. Patient not taking: Reported on 09/10/2019 11/23/18   [name], MD  hyoscyamine (LEVSIN SL) 0.125 MG SL tablet PLACE 1 TABLET UNDER THE TONGUE AS NEEDED Patient not taking: Reported on 08/28/2019 09/21/18   Pyrtle, Lajuan Lines, MD  nitroGLYCERIN (NITROSTAT) 0.4 MG SL tablet Place 1 tablet (0.4 mg total) under the tongue every 5 (five) minutes as needed for chest pain. 02/16/19   Martinique, Peter M, MD  ondansetron (ZOFRAN) 4 MG tablet Take 1 tablet (4 mg  total) by mouth every 8 (eight) hours as needed for nausea or vomiting. Patient not taking: Reported on 08/28/2019 03/15/19   Melina Schools,  MD   Physical Exam: Vitals:   09/01/2019 1133 09/10/2019 1209 09/12/2019 1245 08/19/2019 1316  BP: 102/67 105/69 117/75 121/73  Pulse: 86 84 87 85  Resp: (!) 34 (!) 29 (!) 26 (!) 25  Temp:      TempSrc:      SpO2: (!) 88% (!) 85% (!) 89% 92%  Weight:      Height:         General exam: Moderately built and nourished patient, lying comfortably supine on the gurney in no obvious distress.  Head, eyes and ENT: Nontraumatic and normocephalic. Pupils equally reacting to light and accommodation. Oral mucosa moist.  Neck: Supple. No JVD, carotid bruit or thyromegaly.  Lymphatics: No lymphadenopathy.  Respiratory system: Good air movement with diffuse crackles bilaterally.  Cardiovascular system: S1 and S2 heard, RRR. No JVD, murmurs, gallops, clicks or pedal edema.  Gastrointestinal system: Abdomen is nondistended, soft and nontender. Normal bowel sounds heard. No organomegaly or masses appreciated.  Extremities: Symmetric 5 x 5 power. Peripheral pulses symmetrically felt.   Skin: No rashes or acute findings.  Musculoskeletal system: Negative exam.  Psychiatry: Pleasant and cooperative.   Labs on Admission:  Basic Metabolic Panel: Recent Labs  Lab 09/04/2019 1003  NA 139  K 4.5  CL 102  CO2 20*  GLUCOSE 152*  BUN 38*  CREATININE 1.69*  CALCIUM 9.3   Liver Function Tests: Recent Labs  Lab 08/21/2019 1003  AST 54*  ALT 40  ALKPHOS 59  BILITOT 0.5  PROT 7.4  ALBUMIN 2.9*   No results for input(s): LIPASE, AMYLASE in the last 168 hours. No results for input(s): AMMONIA in the last 168 hours. CBC: Recent Labs  Lab 08/15/2019 1003  WBC 8.7  NEUTROABS 7.8*  HGB 10.5*  HCT 31.6*  MCV 92.9  PLT 253   Cardiac Enzymes: No results for input(s): CKTOTAL, CKMB, CKMBINDEX, TROPONINI in the last 168 hours.  BNP (last 3 results) No results for input(s): PROBNP in the last 8760 hours. CBG: No results for input(s): GLUCAP in the last 168 hours.  Radiological Exams on  Admission: DG Chest Port 1 View  Result Date: 08/16/2019 CLINICAL DATA:  Hypoxia, COVID positive EXAM: PORTABLE CHEST 1 VIEW COMPARISON:  CT chest dated 06/28/2019 FINDINGS: Multifocal patchy airspace opacities in the lungs bilaterally, lower lung predominant. No pleural effusion or pneumothorax. The heart is normal in size. Cervical spine fixation hardware. IMPRESSION: Multifocal pneumonia in this patient with known COVID, lower lung predominant. Electronically Signed   By: Julian Hy M.D.   On: 08/28/2019 10:31    EKG: Independently reviewed.  Sinus rhythm left axis deviation nonspecific T wave changes intervals are preserved.  Assessment/Plan Acute respiratory failure with hypoxia secondarily to pneumonia due to COVID-19 virus: He is on 15 L nonrebreather, will admit him to progressive care, start on IV remdesivir steroids and Baricitinib, The treatment plan and use of medications and known side effects were discussed with patient/family, they were clearly explained that there is no proven definitive treatment for COVID-19 infection, any medications used here are based on published clinical articles/anecdotal data which are not peer-reviewed or randomized control trials.  Complete risks and long-term side effects are unknown, however in the best clinical judgment they seem to be of some clinical benefit rather than medical risks.  Patient/family agree with the treatment plan and  want to receive the given medications. Try to keep improperly 16 hours a day is not prone out of bed to chair, continue symptom spirometry and flutter valve.  Consult physical therapy.  COPD/bronchiectasis: Noted, not on oxygen at home the patient has a very poor prognosis, I have explained this to him.  At this time we will continue albuterol inhalers. He is not wheezing on physical exam.  Essential hypertension Blood pressure seems to be borderline, he is currently on no antihypertensive medications at home we  will continue to monitor.  Malignant neoplasm of prostate ([place]) Noted.  Thoracic aortic aneurysm ([place]) Noted.  CKD (chronic kidney disease) stage 3, GFR 30-59 ml/min: Having seems to be at baseline we will continue to monitor intermittently.      DVT Prophylaxis: lovenox Code Status: full  Family Communication: none  Disposition Plan: inpatinet      It is my clinical opinion that admission to INPATIENT is reasonable and necessary in this 79 y.o. male past medical history of prostate cancer essential hypertension who tested positive for SARS-CoV-2, chest x-ray showed bilateral infiltrates he is hypoxic requiring 15 L of nonrebreather to keep saturations greater than 90%, started on IV remdesivir steroids and baricitanib.  Given the aforementioned, the predictability of an adverse outcome is felt to be significant. I expect that the patient will require at least 2 midnights in the hospital to treat this condition.  Charlynne Cousins MD [facility]   09/10/2019, 1:22 PM

## 2019-08-30 ENCOUNTER — Inpatient Hospital Stay (HOSPITAL_COMMUNITY): Payer: PPO

## 2019-08-30 LAB — COMPREHENSIVE METABOLIC PANEL
ALT: 32 U/L (ref 0–44)
AST: 48 U/L — ABNORMAL HIGH (ref 15–41)
Albumin: 2.3 g/dL — ABNORMAL LOW (ref 3.5–5.0)
Alkaline Phosphatase: 57 U/L (ref 38–126)
Anion gap: 11 (ref 5–15)
BUN: 38 mg/dL — ABNORMAL HIGH (ref 8–23)
CO2: 21 mmol/L — ABNORMAL LOW (ref 22–32)
Calcium: 8.4 mg/dL — ABNORMAL LOW (ref 8.9–10.3)
Chloride: 106 mmol/L (ref 98–111)
Creatinine, Ser: 1.27 mg/dL — ABNORMAL HIGH (ref 0.61–1.24)
GFR calc Af Amer: >60 mL/min (ref >60)
GFR calc non Af Amer: 54 mL/min — ABNORMAL LOW (ref >60)
Glucose, Bld: 156 mg/dL — ABNORMAL HIGH (ref 70–99)
Potassium: 4.4 mmol/L (ref 3.5–5.1)
Sodium: 138 mmol/L (ref 135–145)
Total Bilirubin: 0.4 mg/dL (ref 0.3–1.2)
Total Protein: 7.1 g/dL (ref 6.5–8.1)

## 2019-08-30 LAB — CBC WITH DIFFERENTIAL/PLATELET
Abs Immature Granulocytes: 0.03 10*3/uL (ref 0.00–0.07)
Basophils Absolute: 0 10*3/uL (ref 0.0–0.1)
Basophils Relative: 0 %
Eosinophils Absolute: 0 10*3/uL (ref 0.0–0.5)
Eosinophils Relative: 0 %
HCT: 30.6 % — ABNORMAL LOW (ref 39.0–52.0)
Hemoglobin: 10.2 g/dL — ABNORMAL LOW (ref 13.0–17.0)
Immature Granulocytes: 1 %
Lymphocytes Relative: 8 %
Lymphs Abs: 0.5 10*3/uL — ABNORMAL LOW (ref 0.7–4.0)
MCH: 31.3 pg (ref 26.0–34.0)
MCHC: 33.3 g/dL (ref 30.0–36.0)
MCV: 93.9 fL (ref 80.0–100.0)
Monocytes Absolute: 0.2 10*3/uL (ref 0.1–1.0)
Monocytes Relative: 4 %
Neutro Abs: 5.6 10*3/uL (ref 1.7–7.7)
Neutrophils Relative %: 87 %
Platelets: 265 10*3/uL (ref 150–400)
RBC: 3.26 MIL/uL — ABNORMAL LOW (ref 4.22–5.81)
RDW: 13.9 % (ref 11.5–15.5)
WBC: 6.3 10*3/uL (ref 4.0–10.5)
nRBC: 0 % (ref 0.0–0.2)

## 2019-08-30 LAB — C-REACTIVE PROTEIN: CRP: 17.3 mg/dL — ABNORMAL HIGH (ref <1.0)

## 2019-08-30 MED ORDER — FUROSEMIDE 10 MG/ML IJ SOLN: 40.0000 mg | Freq: Once | INTRAMUSCULAR | Status: AC

## 2019-08-30 MED ORDER — FUROSEMIDE 10 MG/ML IJ SOLN
40.0000 mg | Freq: Once | INTRAMUSCULAR | Status: AC
  Administered 2019-08-30: 40 mg via INTRAVENOUS
  Filled 2019-08-30: qty 4

## 2019-08-30 MED ORDER — FUROSEMIDE 10 MG/ML IJ SOLN
INTRAMUSCULAR | Status: AC
  Administered 2019-08-30: 40 mg via INTRAVENOUS
  Filled 2019-08-30: qty 4

## 2019-08-30 NOTE — Progress Notes (Signed)
OT Cancellation Note  Patient Details Name: David Cantrell MRN: [id number] DOB: [birth year]   Cancelled Treatment:    Reason Eval/Treat Not Completed: Medical issues which prohibited therapy. Spoke with MD, ask for hold of therapies today, pt currently on HFNC on 40L . Will continue to follow.  Delbert Phenix OT OT pager: [phone]   Rosemary Holms 08/30/2019, 9:31 AM

## 2019-08-30 NOTE — Progress Notes (Signed)
PROGRESS NOTE  David Cantrell OEU:[id number] DOB: 1940/05/16 DOA: 09/01/2019  PCP: [name], MD  Brief History/Interval Summary: 79 y.o. male past medical history of COPD bronchiectasis essential hypertension chronic kidney disease stage IIIa who started having body aches on 08/19/2019.  Apparently tested positive for COVID-19 on August 13.  He was apparently set up for monoclonal antibody infusion but was noted to be tachypneic tachycardic and hypoxic and so he was transferred to the emergency department.  Hospitalized for further management.    Reason for Visit: Acute respiratory failure with hypoxia.  Pneumonia due to COVID-19.  Consultants: None yet  Procedures: None  Antibiotics: Anti-infectives (From admission, onward)   Start     Dose/Rate Route Frequency Ordered Stop   08/30/19 1000  remdesivir 100 mg in sodium chloride 0.9 % 100 mL IVPB     Discontinue    "Followed by" Linked Group Details   100 mg 200 mL/hr over 30 Minutes Intravenous Daily 09/01/2019 1247 09/03/19 0959   08/30/19 1000  remdesivir 100 mg in sodium chloride 0.9 % 100 mL IVPB  Status:  Discontinued       "Followed by" Linked Group Details   100 mg 200 mL/hr over 30 Minutes Intravenous Daily 08/28/2019 1518 09/04/2019 1621   08/13/2019 1600  remdesivir 200 mg in sodium chloride 0.9% 250 mL IVPB  Status:  Discontinued       "Followed by" Linked Group Details   200 mg 580 mL/hr over 30 Minutes Intravenous Once 09/06/2019 1518 08/18/2019 1621   09/06/2019 1400  remdesivir 200 mg in sodium chloride 0.9% 250 mL IVPB       "Followed by" Linked Group Details   200 mg 580 mL/hr over 30 Minutes Intravenous Once 08/15/2019 1247 08/30/2019 1731      Subjective/Interval History: Patient mentions that he is having difficulty breathing.  Denies any chest pain.  Even minimal exertion causes him to become short of breath.  Has had a cough with whitish expectoration.  Denies any blood in the sputum.  ROS: Denies any nausea or  vomiting.    Assessment/Plan:  Acute Hypoxic Resp. Failure/Pneumonia due to COVID-19  Recent Labs  Lab 08/16/2019 1003 08/30/19 0442  DDIMER 1.23*  --   FERRITIN 484*  --   CRP 13.5* 17.3*  ALT 40 32  PROCALCITON <0.10  --     Objective findings: Fever: Noted to be afebrile Oxygen requirements: He was on HFNC and nonrebreather this morning.  Switched over to heated high flow at 40 L/min 100% FiO2.  COVID 19 Therapeutics: Antibacterials: None Remdesivir: Day 2 Steroids: Solu-Medrol 50 mg twice a day Diuretics: None Actemra/Baricitinib: On baricitinib PUD Prophylaxis: Will initiate Pepcid DVT Prophylaxis:  Lovenox   Patient with high oxygen requirements.  He was on high flow along with a nonrebreather saturating in the mid 80s.  He was noted to have slightly increased work of breathing.  He was switched over to heated high flow.  Chest x-ray to be repeated.  His inflammatory markers are noted to be elevated.  He is on Remdesivir steroids and also on baricitinib.  Discussed in detail with patient.  He does want to be intubated and put on mechanical ventilation if necessary.  Patient to be given a dose of Lasix.  May need to involve pulmonology if his oxygen requirements continue to climb.  Try to put him in prone position as much as possible.  Incentive spirometry.  Continue to trend inflammatory markers.  The treatment  plan and use of medications and known side effects were discussed with patient. Some of the medications used are based on case reports/anecdotal data.  All other medications being used in the management of COVID-19 based on limited study data.  Complete risks and long-term side effects are unknown, however in the best clinical judgment they seem to be of some benefit.  Despite lack of benefit noted on preliminary reports from RCT's patient does want this medication knowing that this is considered experimental treatment.  Patient was started on baricitinib.  Chronic  kidney disease stage IIIa Renal function seems to be at baseline.  Monitor urine output.  History of COPD and bronchiectasis Patient not on oxygen at home.  Currently not wheezing.    Essential hypertension Monitor blood pressures closely.  Not on any antihypertensives at home.  Mild transaminitis Secondary to COVID-19.  Normocytic anemia No evidence of overt bleeding.  Likely due to chronic disease.  Continue to monitor.  History of prostate cancer Stable.  History of thoracic aortic aneurysm Stable.  DVT Prophylaxis: Lovenox Code Status: Full code Family Communication: Discussed with the patient.  Will update his wife. Disposition Plan: Hopefully return home when improved.  Status is: Inpatient  Remains inpatient appropriate because:IV treatments appropriate due to intensity of illness or inability to take PO, Inpatient level of care appropriate due to severity of illness and Acute hypoxic respiratory failure   Dispo: The patient is from: Home              Anticipated d/c is to: Home              Anticipated d/c date is: > 3 days              Patient currently is not medically stable to d/c.    Medications:  Scheduled:  albuterol  2 puff Inhalation Q6H   alfuzosin  10 mg Oral Daily   aspirin EC  81 mg Oral Daily   baricitinib  4 mg Oral Daily   brimonidine  1 drop Right Eye BID   dorzolamide  1 drop Right Eye BID   enoxaparin (LOVENOX) injection  40 mg Subcutaneous Q24H   fenofibrate  160 mg Oral Daily   latanoprost  1 drop Both Eyes QHS   methylPREDNISolone (SOLU-MEDROL) injection  0.5 mg/kg Intravenous Q12H   [START ON 08/31/2019] rosuvastatin  10 mg Oral Once per day on Mon Thu   Continuous:  remdesivir 100 mg in NS 100 mL Stopped (08/30/19 1026)   ZOX:[id number], guaiFENesin-dextromethorphan, nitroGLYCERIN, polyethylene glycol   Objective:  Vital Signs  Vitals:   08/30/19 1153 08/30/19 1155 08/30/19 1200 08/30/19 1205    BP: (!) 144/84 (!) 144/84 135/82   Pulse: 75 73 81 69  Resp: (!) 31 (!) 25  (!) 21  Temp:      TempSrc:      SpO2: (!) 89% (!) 84%  92%  Weight:      Height:        Intake/Output Summary (Last 24 hours) at 08/30/2019 1211 Last data filed at 08/30/2019 1026 Gross per 24 hour  Intake 1100 ml  Output --  Net 1100 ml   Filed Weights   09/02/2019 0937 08/23/2019 0940  Weight: 99.8 kg 99.8 kg    General appearance: Awake alert.  In no distress.  Anxious. Resp: Tachypneic.  Use of accessory muscles noted.  Crackles bilateral bases.  No wheezing or rhonchi.   Cardio: S1-S2 is normal regular.  No S3-S4.  No rubs murmurs or bruit GI: Abdomen is soft.  Nontender nondistended.  Bowel sounds are present normal.  No masses organomegaly Extremities: No edema.  Full range of motion of lower extremities. Neurologic: Alert and oriented x3.  No focal neurological deficits.    Lab Results:  Data Reviewed: I have personally reviewed following labs and imaging studies  CBC: Recent Labs  Lab 08/30/2019 1003 09/05/2019 1819 08/30/19 0442  WBC 8.7 6.8 6.3  NEUTROABS 7.8*  --  5.6  HGB 10.5* 8.7* 10.2*  HCT 31.6* 26.6* 30.6*  MCV 92.9 95.0 93.9  PLT 253 203 237    Basic Metabolic Panel: Recent Labs  Lab 08/28/2019 1003 08/25/2019 1819 08/30/19 0442  NA 139  --  138  K 4.5  --  4.4  CL 102  --  106  CO2 20*  --  21*  GLUCOSE 152*  --  156*  BUN 38*  --  38*  CREATININE 1.69* 1.24 1.27*  CALCIUM 9.3  --  8.4*    GFR: Estimated Creatinine Clearance: 59.8 mL/min (A) (by C-G formula based on SCr of 1.27 mg/dL (H)).  Liver Function Tests: Recent Labs  Lab 09/05/2019 1003 08/30/19 0442  AST 54* 48*  ALT 40 32  ALKPHOS 59 57  BILITOT 0.5 0.4  PROT 7.4 7.1  ALBUMIN 2.9* 2.3*    Lipid Profile: Recent Labs    09/10/2019 1003  TRIG 106    Anemia Panel: Recent Labs    08/28/2019 1003  FERRITIN 484*    Recent Results (from the past 240 hour(s))  SARS Coronavirus 2 by RT PCR  (hospital order, performed in [facility] hospital lab) Nasopharyngeal Nasopharyngeal Swab     Status: Abnormal   Collection Time: 09/06/2019 10:29 AM   Specimen: Nasopharyngeal Swab  Result Value Ref Range Status   SARS Coronavirus 2 POSITIVE (A) NEGATIVE Final    Comment: RESULT CALLED TO, READ BACK BY AND VERIFIED WITH: GRANT,K. RN @1220  ON 8.17.2021 BY NMCCOY (NOTE) SARS-CoV-2 target nucleic acids are DETECTED  SARS-CoV-2 RNA is generally detectable in upper respiratory specimens  during the acute phase of infection.  Positive results are indicative  of the presence of the identified virus, but do not rule out bacterial infection or co-infection with other pathogens not detected by the test.  Clinical correlation with patient history and  other diagnostic information is necessary to determine patient infection status.  The expected result is negative.  Fact Sheet for Patients:   StrictlyIdeas.no   Fact Sheet for Healthcare Providers:   BankingDealers.co.za    This test is not yet approved or cleared by the [place] FDA and  has been authorized for detection and/or diagnosis of SARS-CoV-2 by FDA under an Emergency Use Authorization (EUA).  This EUA will remain in effect (meaning t his test can be used) for the duration of  the COVID-19 declaration under Section 564(b)(1) of the Act, 21 U.S.C. section 360-bbb-3(b)(1), unless the authorization is terminated or revoked sooner.  Performed at [facility], [place] [address] [place], [place] [address]   Blood Culture (routine x 2)     Status: None (Preliminary result)   Collection Time: 08/18/2019 10:42 AM   Specimen: Left Antecubital; Blood  Result Value Ref Range Status   Specimen Description   Final    LEFT ANTECUBITAL Performed at [facility] [address], [place], [place] [address]    Special Requests   Final    BOTTLES DRAWN AEROBIC  AND ANAEROBIC Blood Culture results may not be optimal due to an excessive volume of blood received in culture bottles Performed at [facility] [address], [place], [place] [address]    Culture   Final    NO GROWTH < 24 HOURS Performed at [facility] [address] [place], [place] [address]    Report Status PENDING  Incomplete  Blood Culture (routine x 2)     Status: None (Preliminary result)   Collection Time: 08/24/2019 10:52 AM   Specimen: Right Antecubital; Blood  Result Value Ref Range Status   Specimen Description   Final    RIGHT ANTECUBITAL Performed at [facility] [address] [place], [place] [address]    Special Requests   Final    BOTTLES DRAWN AEROBIC AND ANAEROBIC Blood Culture results may not be optimal due to an excessive volume of blood received in culture bottles Performed at [facility] [address] [place], [place] [address]    Culture   Final    NO GROWTH < 24 HOURS Performed at [facility] [address], [place], [place] [address]    Report Status PENDING  Incomplete      Radiology Studies: DG CHEST PORT 1 VIEW  Result Date: 08/30/2019 CLINICAL DATA:  79 year old male positive COVID-19. EXAM: PORTABLE CHEST 1 VIEW COMPARISON:  Portable chest 08/30/2019 and earlier. FINDINGS: Portable AP semi upright view at 1012 hours. Stable lung volumes and mediastinal contours. Widespread coarse bilateral pulmonary interstitial opacity is stable since yesterday. No pneumothorax or pleural effusion. Prior cervical ACDF. Postoperative changes also to the right scapula. No acute osseous abnormality identified. IMPRESSION: Widespread coarse bilateral pulmonary interstitial opacity compatible with COVID-19 pneumonia is stable since yesterday. Electronically Signed   By: Genevie Ann M.D.   On: 08/30/2019 10:40   DG Chest Port 1 View  Result Date: 08/28/2019 CLINICAL DATA:  Hypoxia, COVID  positive EXAM: PORTABLE CHEST 1 VIEW COMPARISON:  CT chest dated 06/28/2019 FINDINGS: Multifocal patchy airspace opacities in the lungs bilaterally, lower lung predominant. No pleural effusion or pneumothorax. The heart is normal in size. Cervical spine fixation hardware. IMPRESSION: Multifocal pneumonia in this patient with known COVID, lower lung predominant. Electronically Signed   By: Julian Hy M.D.   On: 08/13/2019 10:31       LOS: 1 day   [facility] Pager on [email or web address]  08/30/2019, 12:11 PM

## 2019-08-30 NOTE — ED Notes (Signed)
Pt sat up in bed, spo2 down to low 80's. Cont for over 7mins. Pt up to 85% and maintaining at 85% with 15HFNC and NRB. MD and resp paged.

## 2019-08-30 NOTE — ED Notes (Signed)
RT called due to pt's O2 dropping to 60's

## 2019-08-30 NOTE — ED Notes (Signed)
Pt given lunch tray, attempted to eat. Pt states he is too weak, and spo2 dropped quickly once NRB removed to eat. Placed back on NRB and HHFNC, spo2 back up >90%. Pt continues on [place], tolerating well when both on properly. MD made aware.

## 2019-08-30 NOTE — Progress Notes (Signed)
PT Cancellation Note  Patient Details Name: David Cantrell MRN: [id number] DOB: 01/19/1940   Cancelled Treatment:    Reason Eval/Treat Not Completed: Medical issues which prohibited therapy Tresa Endo PT Acute Rehabilitation Services Pager [phone] Office [phone]    Claretha Cooper 08/30/2019, 9:38 AM

## 2019-08-30 NOTE — ED Notes (Signed)
RT called due to pt's O2 sat's ranging from 79-83%, RR ranging from 35-42

## 2019-08-30 NOTE — ED Notes (Signed)
Pt assisted to BSC, O2 stayed between 87-95%.

## 2019-08-31 ENCOUNTER — Inpatient Hospital Stay (HOSPITAL_COMMUNITY): Payer: PPO

## 2019-08-31 DIAGNOSIS — M7989 Other specified soft tissue disorders: Secondary | ICD-10-CM

## 2019-08-31 LAB — CBC WITH DIFFERENTIAL/PLATELET
Abs Immature Granulocytes: 0.03 10*3/uL (ref 0.00–0.07)
Basophils Absolute: 0 10*3/uL (ref 0.0–0.1)
Basophils Relative: 0 %
Eosinophils Absolute: 0 10*3/uL (ref 0.0–0.5)
Eosinophils Relative: 0 %
HCT: 31.4 % — ABNORMAL LOW (ref 39.0–52.0)
Hemoglobin: 10.5 g/dL — ABNORMAL LOW (ref 13.0–17.0)
Immature Granulocytes: 0 %
Lymphocytes Relative: 8 %
Lymphs Abs: 0.6 10*3/uL — ABNORMAL LOW (ref 0.7–4.0)
MCH: 30.7 pg (ref 26.0–34.0)
MCHC: 33.4 g/dL (ref 30.0–36.0)
MCV: 91.8 fL (ref 80.0–100.0)
Monocytes Absolute: 0.4 10*3/uL (ref 0.1–1.0)
Monocytes Relative: 6 %
Neutro Abs: 6.5 10*3/uL (ref 1.7–7.7)
Neutrophils Relative %: 86 %
Platelets: 286 10*3/uL (ref 150–400)
RBC: 3.42 MIL/uL — ABNORMAL LOW (ref 4.22–5.81)
RDW: 13.8 % (ref 11.5–15.5)
WBC: 7.6 10*3/uL (ref 4.0–10.5)
nRBC: 0 % (ref 0.0–0.2)

## 2019-08-31 LAB — COMPREHENSIVE METABOLIC PANEL
ALT: 31 U/L (ref 0–44)
AST: 50 U/L — ABNORMAL HIGH (ref 15–41)
Albumin: 2.5 g/dL — ABNORMAL LOW (ref 3.5–5.0)
Alkaline Phosphatase: 65 U/L (ref 38–126)
Anion gap: 13 (ref 5–15)
BUN: 53 mg/dL — ABNORMAL HIGH (ref 8–23)
CO2: 23 mmol/L (ref 22–32)
Calcium: 8.4 mg/dL — ABNORMAL LOW (ref 8.9–10.3)
Chloride: 104 mmol/L (ref 98–111)
Creatinine, Ser: 1.46 mg/dL — ABNORMAL HIGH (ref 0.61–1.24)
GFR calc Af Amer: 53 mL/min — ABNORMAL LOW (ref >60)
GFR calc non Af Amer: 46 mL/min — ABNORMAL LOW (ref >60)
Glucose, Bld: 155 mg/dL — ABNORMAL HIGH (ref 70–99)
Potassium: 4.1 mmol/L (ref 3.5–5.1)
Sodium: 140 mmol/L (ref 135–145)
Total Bilirubin: 0.6 mg/dL (ref 0.3–1.2)
Total Protein: 6.9 g/dL (ref 6.5–8.1)

## 2019-08-31 LAB — C-REACTIVE PROTEIN: CRP: 11 mg/dL — ABNORMAL HIGH (ref <1.0)

## 2019-08-31 LAB — MRSA PCR SCREENING: MRSA by PCR: NEGATIVE

## 2019-08-31 LAB — D-DIMER, QUANTITATIVE: D-Dimer, Quant: >20 ug/mL-FEU — ABNORMAL HIGH (ref 0.00–0.50)

## 2019-08-31 MED ORDER — ORAL CARE MOUTH RINSE
15.0000 mL | Freq: Two times a day (BID) | OROMUCOSAL | Status: DC
  Administered 2019-08-31 – 2019-09-10 (×20): 15 mL via OROMUCOSAL

## 2019-08-31 MED ORDER — FAMOTIDINE 20 MG PO TABS
20.0000 mg | ORAL_TABLET | Freq: Every day | ORAL | Status: DC
  Administered 2019-08-31 – 2019-09-06 (×7): 20 mg via ORAL
  Filled 2019-08-31 (×7): qty 1

## 2019-08-31 MED ORDER — FUROSEMIDE 10 MG/ML IJ SOLN
60.0000 mg | Freq: Once | INTRAMUSCULAR | Status: AC
  Administered 2019-08-31: 60 mg via INTRAVENOUS
  Filled 2019-08-31: qty 6

## 2019-08-31 MED ORDER — METHYLPREDNISOLONE SODIUM SUCC 125 MG IJ SOLR
0.5000 mg/kg | Freq: Two times a day (BID) | INTRAMUSCULAR | Status: DC
  Administered 2019-08-31 – 2019-09-07 (×14): 50 mg via INTRAVENOUS
  Filled 2019-08-31 (×12): qty 2

## 2019-08-31 MED ORDER — ENOXAPARIN SODIUM 100 MG/ML ~~LOC~~ SOLN
1.0000 mg/kg | Freq: Two times a day (BID) | SUBCUTANEOUS | Status: DC
  Administered 2019-08-31 – 2019-09-10 (×21): 100 mg via SUBCUTANEOUS
  Filled 2019-08-31 (×23): qty 1

## 2019-08-31 MED ORDER — CHLORHEXIDINE GLUCONATE CLOTH 2 % EX PADS
6.0000 | MEDICATED_PAD | Freq: Every day | CUTANEOUS | Status: DC
  Administered 2019-08-31 – 2019-09-10 (×11): 6 via TOPICAL

## 2019-08-31 NOTE — Progress Notes (Signed)
ANTICOAGULATION CONSULT NOTE - Initial Consult  Pharmacy Consult for Lovenox Indication: Suspected VTE  Allergies  Allergen Reactions  . Aspirin   . Doxycycline Other (See Comments)    Dizziness   . Fish Oil   . Lipitor [Atorvastatin] Other (See Comments)    MUSCLE ACHES  . Morphine And Related Other (See Comments)    Severe headache  . Niacin And Related Other (See Comments)    FLUSHING  . Ramipril Cough  . Sulfa Antibiotics Hives  . Codeine Other (See Comments)    CONSTIPATION    Patient Measurements: Height: 6\' 4"  (193 cm) Weight: 99.8 kg (220 lb) IBW/kg (Calculated) : 86.8 Heparin Dosing Weight:  Vital Signs: Temp: 98.6 F (37 C) (08/19 0812) Temp Source: Oral (08/19 0812) BP: 146/76 (08/19 0900) Pulse Rate: 78 (08/19 0942)  Labs: Recent Labs    08/15/2019 1819 09/10/2019 1819 08/30/19 0442 08/31/19 0509  HGB 8.7*   < > 10.2* 10.5*  HCT 26.6*  --  30.6* 31.4*  PLT 203  --  265 286  CREATININE 1.24  --  1.27* 1.46*   < > = values in this interval not displayed.    Estimated Creatinine Clearance: 52 mL/min (A) (by C-G formula based on SCr of 1.46 mg/dL (H)).   Medical History: Past Medical History:  Diagnosis Date  . Allergy   . Aortic ectasia (HCC)    a. 2.5cm aortic ectasia by CT 05/2017 (follow-up due 05/2022).  Marland Kitchen Arthritis   . Bronchiectasis ([place])    per pulmologist note -- dr Lake Bells-- related to multiple episodes pneumonia as adult  . CAD (coronary artery disease)    a. L PDA obstructive disease 2012, managed medically.  . Cancer ([place])    basal cell ca  . Cataract   . Centrilobular emphysema ([place])   . Congenital renal cyst   . COPD (chronic obstructive pulmonary disease) (HCC)    pulmologist-  dr Lake Bells  . Diverticulosis of colon   . Dyspnea    upon exerting self -beengoing on 10 yrs.  . Emphysema of lung ([place])   . First degree heart block   . Hemorrhoids   . History of adenomatous polyp of colon    hx multiple tubular adenoma's and  hyperplastic polyp's since 2001--- last colonoscopy 05-29-2015  . History of basal cell carcinoma excision    10-31-2012  nose  . History of external beam radiation therapy 02-01-2016  to 03-16-2016   prostate 45Gy  plus radioactive prostate seed implants 04-07-2016  . History of kidney stones   . Hyperlipidemia   . Hypertension   . 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 Multiple pulmonary nodules    S/P  BILATERAL BRONCHOSCOPY  09-24-2014  no infection --    . Open-angle glaucoma    RIGHT EYE ONLY/OPEN ANGLE  . Pneumonia    x3  . Prostate cancer [facility]) urologist-  dr eskridge/  oncologist-  dr Tammi Klippel   Stage T1c,  Gleason3+4, PSA 4.7--  s/p  external beam radiation therapy 02-01-2016 to 03-16-2016  . Seasonal allergies   . Thoracic aortic aneurysm ([place])    a. [name] following -4.3cm by CT 06/2018.  Marland Kitchen Wears dentures    full upper and lower parital    Medications:  Scheduled:  . albuterol  2 puff Inhalation Q6H  . alfuzosin  10 mg Oral Daily  . aspirin EC  81 mg Oral Daily  . baricitinib  4 mg Oral  Daily  . brimonidine  1 drop Right Eye BID  . Chlorhexidine Gluconate Cloth  6 each Topical Daily  . dorzolamide  1 drop Right Eye BID  . enoxaparin (LOVENOX) injection  1 mg/kg Subcutaneous Q12H  . famotidine  20 mg Oral Daily  . fenofibrate  160 mg Oral Daily  . latanoprost  1 drop Both Eyes QHS  . methylPREDNISolone (SOLU-MEDROL) injection  0.5 mg/kg Intravenous Q12H  . rosuvastatin  10 mg Oral Once per day on Mon Thu   Infusions:  . remdesivir 100 mg in NS 100 mL Stopped (08/30/19 1026)    Assessment: 81 yoM admitted with COVID-19 pneumonia.  Initially started on Lovenox for VTE prophylaxis.  High suspicion for VTE today and pharmacy is consulted to dose Lovenox.   No PTA anticoagulation.  Lovenox 40mg  given 8/17-8/18 SCr 1.46, near baseline with CrCl ~ 52 ml/min CBC: Hgb 10.5, Plt wnl D-dimer increased (1.23 on 8/17, >20 on 8/19)  Goal of Therapy:   Anti-Xa level 0.6-1 units/ml 4hrs after LMWH dose given Monitor platelets by anticoagulation protocol: Yes   Plan:  Lovenox 1 mg/kg (100mg ) [place] q12h Follow up renal function, CBC, imaging, s/s bleeding.  Gretta Arab PharmD, BCPS Clinical Pharmacist [facility] main pharmacy [phone] 08/31/2019 11:35 AM

## 2019-08-31 NOTE — ED Notes (Signed)
Attempted to call report. Will try again shortly  

## 2019-08-31 NOTE — Progress Notes (Signed)
PT Cancellation Note  Patient Details Name: David Cantrell MRN: [id number] DOB: 03-26-1940   Cancelled Treatment:    Reason Eval/Treat Not Completed: Medical issues which prohibited therapy.   Claretha Cooper 08/31/2019, 8:22 AM  [place] Pager [phone] Office [phone]

## 2019-08-31 NOTE — Progress Notes (Signed)
OT Cancellation Note  Patient Details Name: David Cantrell MRN: [id number] DOB: [birth year]   Cancelled Treatment:    Reason Eval/Treat Not Completed: Medical issues which prohibited therapy. Will continue to follow.  Delbert Phenix OT OT pager: [place] 08/31/2019, 8:22 AM

## 2019-08-31 NOTE — ED Notes (Signed)
Pt eating breakfast 

## 2019-08-31 NOTE — Progress Notes (Signed)
Bilateral lower extremity venous duplex has been completed. Preliminary results can be found in CV Proc through chart review.  Results were given to the patient's nurse, Ubaldo Glassing.  08/31/19 2:38 PM Carlos Levering RVT

## 2019-08-31 NOTE — Progress Notes (Addendum)
PROGRESS NOTE  David Cantrell EVO:[id number] DOB: 1940-03-02 DOA: 08/21/2019  PCP: [name], MD  Brief History/Interval Summary: 79 y.o. male past medical history of COPD bronchiectasis essential hypertension chronic kidney disease stage IIIa who started having body aches on 08/19/2019.  Apparently tested positive for COVID-19 on August 13.  He was apparently set up for monoclonal antibody infusion but was noted to be tachypneic tachycardic and hypoxic and so he was transferred to the emergency department.  Hospitalized for further management.    Reason for Visit: Acute respiratory failure with hypoxia.  Pneumonia due to COVID-19.  Consultants: None yet  Procedures: None  Antibiotics: Anti-infectives (From admission, onward)   Start     Dose/Rate Route Frequency Ordered Stop   08/30/19 1000  remdesivir 100 mg in sodium chloride 0.9 % 100 mL IVPB       "Followed by" Linked Group Details   100 mg 200 mL/hr over 30 Minutes Intravenous Daily 08/25/2019 1247 09/03/19 0959   08/30/19 1000  remdesivir 100 mg in sodium chloride 0.9 % 100 mL IVPB  Status:  Discontinued       "Followed by" Linked Group Details   100 mg 200 mL/hr over 30 Minutes Intravenous Daily 08/21/2019 1518 09/12/2019 1621   08/30/2019 1600  remdesivir 200 mg in sodium chloride 0.9% 250 mL IVPB  Status:  Discontinued       "Followed by" Linked Group Details   200 mg 580 mL/hr over 30 Minutes Intravenous Once 08/24/2019 1518 09/04/2019 1621   09/12/2019 1400  remdesivir 200 mg in sodium chloride 0.9% 250 mL IVPB       "Followed by" Linked Group Details   200 mg 580 mL/hr over 30 Minutes Intravenous Once 08/30/2019 1247 09/06/2019 1731      Subjective/Interval History: Patient continues to have difficulty breathing.  Continues to be tired and fatigued.  Denies any chest pain.  No nausea vomiting.       Assessment/Plan:  Acute Hypoxic Resp. Failure/Pneumonia due to COVID-19  Recent Labs  Lab 09/10/2019 1003 08/30/19 0442  08/31/19 0509  DDIMER 1.23*  --  >20.00*  FERRITIN 484*  --   --   CRP 13.5* 17.3* 11.0*  ALT 40 32 31  PROCALCITON <0.10  --   --     Objective findings: Fever: Remains afebrile Oxygen requirements: Remains on heated high flow along with a nonrebreather saturating in the late 80s early 90s.    COVID 19 Therapeutics: Antibacterials: None.  Procalcitonin was less than 0.1 Remdesivir: Day 3 Steroids: Solu-Medrol 50 mg twice a day Diuretics: None Actemra/Baricitinib: On baricitinib PUD Prophylaxis: Pepcid DVT Prophylaxis: Therapeutic Lovenox   Patient continues to be on high amounts of oxygen.  Currently on heated high flow 40 L/min and also requiring nonrebreather.  Saturating in the late 80s to early 90s.  Patient remains on Remdesivir steroids.  Also getting baricitinib.  Patient says CRP noted to be slightly better today at 11.0.  However his D-dimer has gone up significantly to greater than 20.  Patient very tenuous to be transported for any radiological studies at this time.  Plus he has slightly elevated creatinine.  We will order lower extremity Dopplers. However at this time due to high suspicion for venous thromboembolism we will place him on therapeutic Lovenox. Also given furosemide yesterday with which he did diurese.  Due to elevated BUN and creatinine today we will hold off on further doses. Patient remains at high risk for decompensation.  Chronic  kidney disease stage IIIa Renal function close to baseline at this time.  Continue to monitor urine output.  History of COPD and bronchiectasis Patient not on oxygen at home.  Currently not wheezing.    Essential hypertension Monitor blood pressures closely.  Not on any antihypertensives at home.  Mild transaminitis Secondary to COVID-19.  Stable.  Normocytic anemia No evidence of overt bleeding.  Likely due to chronic disease.  Continue to monitor.  History of prostate cancer Stable.  History of thoracic aortic  aneurysm Stable.  DVT Prophylaxis: Lovenox Code Status: Full code Family Communication: Discussed with patient.  Wife was updated yesterday.  Will do so again today. Disposition Plan: Hopefully return home when improved.  Status is: Inpatient  Remains inpatient appropriate because:IV treatments appropriate due to intensity of illness or inability to take PO and Inpatient level of care appropriate due to severity of illness   Dispo:  Patient From: Home  Planned Disposition: To be determined  Expected discharge date: 09/03/19  Medically stable for discharge: No     Medications:  Scheduled: . albuterol  2 puff Inhalation Q6H  . alfuzosin  10 mg Oral Daily  . aspirin EC  81 mg Oral Daily  . baricitinib  4 mg Oral Daily  . brimonidine  1 drop Right Eye BID  . dorzolamide  1 drop Right Eye BID  . fenofibrate  160 mg Oral Daily  . latanoprost  1 drop Both Eyes QHS  . methylPREDNISolone (SOLU-MEDROL) injection  0.5 mg/kg Intravenous Q12H  . rosuvastatin  10 mg Oral Once per day on Mon Thu   Continuous: . remdesivir 100 mg in NS 100 mL Stopped (08/30/19 1026)   NLZ:JQBHALPFXTKWIOXB-DZHGDJMEQAS, guaiFENesin-dextromethorphan, nitroGLYCERIN, polyethylene glycol   Objective:  Vital Signs  Vitals:   08/31/19 0900 08/31/19 0942 08/31/19 1100 08/31/19 1115  BP: (!) 146/76     Pulse: 84 78    Resp: (!) 24 15    Temp:      TempSrc:      SpO2: (!) 87% 99% (!) 85% (!) 87%  Weight:      Height:        Intake/Output Summary (Last 24 hours) at 08/31/2019 1120 Last data filed at 08/30/2019 1839 Gross per 24 hour  Intake --  Output 1100 ml  Net -1100 ml   Filed Weights   08/24/2019 0937 09/10/2019 0940  Weight: 99.8 kg 99.8 kg    General appearance: Awake alert.  In no distress Resp: Tachypneic.  Some use of accessory muscles noted.  Crackles bilateral bases.  No wheezing or rhonchi.   Cardio: S1-S2 is normal regular.  No S3-S4.  No rubs murmurs or bruit GI: Abdomen is soft.   Nontender nondistended.  Bowel sounds are present normal.  No masses organomegaly Extremities: No edema.  Full range of motion of lower extremities. Neurologic: Alert and oriented x3.  No focal neurological deficits.    Lab Results:  Data Reviewed: I have personally reviewed following labs and imaging studies  CBC: Recent Labs  Lab 08/23/2019 1003 09/07/2019 1819 08/30/19 0442 08/31/19 0509  WBC 8.7 6.8 6.3 7.6  NEUTROABS 7.8*  --  5.6 6.5  HGB 10.5* 8.7* 10.2* 10.5*  HCT 31.6* 26.6* 30.6* 31.4*  MCV 92.9 95.0 93.9 91.8  PLT 253 203 265 341    Basic Metabolic Panel: Recent Labs  Lab 09/08/2019 1003 08/27/2019 1819 08/30/19 0442 08/31/19 0509  NA 139  --  138 140  K 4.5  --  4.4  4.1  CL 102  --  106 104  CO2 20*  --  21* 23  GLUCOSE 152*  --  156* 155*  BUN 38*  --  38* 53*  CREATININE 1.69* 1.24 1.27* 1.46*  CALCIUM 9.3  --  8.4* 8.4*    GFR: Estimated Creatinine Clearance: 52 mL/min (A) (by C-G formula based on SCr of 1.46 mg/dL (H)).  Liver Function Tests: Recent Labs  Lab 08/30/2019 1003 08/30/19 0442 08/31/19 0509  AST 54* 48* 50*  ALT 40 32 31  ALKPHOS 59 57 65  BILITOT 0.5 0.4 0.6  PROT 7.4 7.1 6.9  ALBUMIN 2.9* 2.3* 2.5*    Lipid Profile: Recent Labs    09/07/2019 1003  TRIG 106    Anemia Panel: Recent Labs    08/15/2019 1003  FERRITIN 484*    Recent Results (from the past 240 hour(s))  SARS Coronavirus 2 by RT PCR (hospital order, performed in [facility] hospital lab) Nasopharyngeal Nasopharyngeal Swab     Status: Abnormal   Collection Time: 08/28/2019 10:29 AM   Specimen: Nasopharyngeal Swab  Result Value Ref Range Status   SARS Coronavirus 2 POSITIVE (A) NEGATIVE Final    Comment: RESULT CALLED TO, READ BACK BY AND VERIFIED WITH: GRANT,K. RN @1220  ON 8.17.2021 BY NMCCOY (NOTE) SARS-CoV-2 target nucleic acids are DETECTED  SARS-CoV-2 RNA is generally detectable in upper respiratory specimens  during the acute phase of infection.  Positive  results are indicative  of the presence of the identified virus, but do not rule out bacterial infection or co-infection with other pathogens not detected by the test.  Clinical correlation with patient history and  other diagnostic information is necessary to determine patient infection status.  The expected result is negative.  Fact Sheet for Patients:   StrictlyIdeas.no   Fact Sheet for Healthcare Providers:   BankingDealers.co.za    This test is not yet approved or cleared by the [place] FDA and  has been authorized for detection and/or diagnosis of SARS-CoV-2 by FDA under an Emergency Use Authorization (EUA).  This EUA will remain in effect (meaning t his test can be used) for the duration of  the COVID-19 declaration under Section 564(b)(1) of the Act, 21 U.S.C. section 360-bbb-3(b)(1), unless the authorization is terminated or revoked sooner.  Performed at [facility], [place] [address], [place], [place] [address]   Blood Culture (routine x 2)     Status: None (Preliminary result)   Collection Time: 08/30/2019 10:42 AM   Specimen: Left Antecubital; Blood  Result Value Ref Range Status   Specimen Description   Final    LEFT ANTECUBITAL Performed at [place] [address] [place], [place] [address]    Special Requests   Final    BOTTLES DRAWN AEROBIC AND ANAEROBIC Blood Culture results may not be optimal due to an excessive volume of blood received in culture bottles Performed at [facility] [address] [place], [place] [address]    Culture   Final    NO GROWTH 2 DAYS Performed at [facility] [address] [place],  [address]    Report Status PENDING  Incomplete  Blood Culture (routine x 2)     Status: None (Preliminary result)   Collection Time: 09/03/2019 10:52 AM   Specimen: Right Antecubital; Blood  Result Value Ref Range Status    Specimen Description   Final    RIGHT ANTECUBITAL Performed at [facility], [place] [address], [place], [place] [address]    Special Requests   Final    BOTTLES DRAWN AEROBIC AND ANAEROBIC Blood Culture results may not be optimal due to an excessive volume of blood received in culture bottles Performed at [facility] [address] [place], [place] [address]    Culture   Final    NO GROWTH 2 DAYS Performed at [facility] [address] [place],  [address]    Report Status PENDING  Incomplete      Radiology Studies: DG CHEST PORT 1 VIEW  Result Date: 08/30/2019 CLINICAL DATA:  79 year old male positive COVID-19. EXAM: PORTABLE CHEST 1 VIEW COMPARISON:  Portable chest 08/24/2019 and earlier. FINDINGS: Portable AP semi upright view at 1012 hours. Stable lung volumes and mediastinal contours. Widespread coarse bilateral pulmonary interstitial opacity is stable since yesterday. No pneumothorax or pleural effusion. Prior cervical ACDF. Postoperative changes also to the right scapula. No acute osseous abnormality identified. IMPRESSION: Widespread coarse bilateral pulmonary interstitial opacity compatible with COVID-19 pneumonia is stable since yesterday. Electronically Signed   By: Genevie Ann M.D.   On: 08/30/2019 10:40       LOS: 2 days   [facility] Pager on [email or web address]  08/31/2019, 11:20 AM

## 2019-08-31 NOTE — ED Notes (Signed)
Pt. Documented in error see above note in chart. 

## 2019-08-31 NOTE — ED Notes (Signed)
Report to ICU

## 2019-09-01 ENCOUNTER — Inpatient Hospital Stay (HOSPITAL_COMMUNITY): Payer: PPO

## 2019-09-01 DIAGNOSIS — J9601 Acute respiratory failure with hypoxia: Secondary | ICD-10-CM

## 2019-09-01 DIAGNOSIS — I82403 Acute embolism and thrombosis of unspecified deep veins of lower extremity, bilateral: Secondary | ICD-10-CM

## 2019-09-01 LAB — CBC WITH DIFFERENTIAL/PLATELET
Abs Immature Granulocytes: 0.05 10*3/uL (ref 0.00–0.07)
Basophils Absolute: 0 10*3/uL (ref 0.0–0.1)
Basophils Relative: 0 %
Eosinophils Absolute: 0 10*3/uL (ref 0.0–0.5)
Eosinophils Relative: 0 %
HCT: 33.1 % — ABNORMAL LOW (ref 39.0–52.0)
Hemoglobin: 10.8 g/dL — ABNORMAL LOW (ref 13.0–17.0)
Immature Granulocytes: 1 %
Lymphocytes Relative: 10 %
Lymphs Abs: 0.8 10*3/uL (ref 0.7–4.0)
MCH: 30.7 pg (ref 26.0–34.0)
MCHC: 32.6 g/dL (ref 30.0–36.0)
MCV: 94 fL (ref 80.0–100.0)
Monocytes Absolute: 0.6 10*3/uL (ref 0.1–1.0)
Monocytes Relative: 7 %
Neutro Abs: 6.8 10*3/uL (ref 1.7–7.7)
Neutrophils Relative %: 82 %
Platelets: 297 10*3/uL (ref 150–400)
RBC: 3.52 MIL/uL — ABNORMAL LOW (ref 4.22–5.81)
RDW: 14 % (ref 11.5–15.5)
WBC: 8.2 10*3/uL (ref 4.0–10.5)
nRBC: 0 % (ref 0.0–0.2)

## 2019-09-01 LAB — COMPREHENSIVE METABOLIC PANEL
ALT: 47 U/L — ABNORMAL HIGH (ref 0–44)
AST: 65 U/L — ABNORMAL HIGH (ref 15–41)
Albumin: 2.4 g/dL — ABNORMAL LOW (ref 3.5–5.0)
Alkaline Phosphatase: 74 U/L (ref 38–126)
Anion gap: 12 (ref 5–15)
BUN: 50 mg/dL — ABNORMAL HIGH (ref 8–23)
CO2: 24 mmol/L (ref 22–32)
Calcium: 8.7 mg/dL — ABNORMAL LOW (ref 8.9–10.3)
Chloride: 105 mmol/L (ref 98–111)
Creatinine, Ser: 1.38 mg/dL — ABNORMAL HIGH (ref 0.61–1.24)
GFR calc Af Amer: 57 mL/min — ABNORMAL LOW (ref >60)
GFR calc non Af Amer: 49 mL/min — ABNORMAL LOW (ref >60)
Glucose, Bld: 157 mg/dL — ABNORMAL HIGH (ref 70–99)
Potassium: 5.6 mmol/L — ABNORMAL HIGH (ref 3.5–5.1)
Sodium: 141 mmol/L (ref 135–145)
Total Bilirubin: 0.7 mg/dL (ref 0.3–1.2)
Total Protein: 7 g/dL (ref 6.5–8.1)

## 2019-09-01 LAB — ECHOCARDIOGRAM LIMITED
AV Peak grad: 4 mmHg
Ao pk vel: 1 m/s
Area-P 1/2: 3.42 cm2
Height: 76 in
Weight: 3520 oz

## 2019-09-01 LAB — POTASSIUM: Potassium: 3.8 mmol/L (ref 3.5–5.1)

## 2019-09-01 LAB — C-REACTIVE PROTEIN: CRP: 7.7 mg/dL — ABNORMAL HIGH (ref <1.0)

## 2019-09-01 LAB — D-DIMER, QUANTITATIVE: D-Dimer, Quant: >20 ug/mL-FEU — ABNORMAL HIGH (ref 0.00–0.50)

## 2019-09-01 MED ORDER — SALINE SPRAY 0.65 % NA SOLN
1.0000 | NASAL | Status: DC | PRN
  Administered 2019-09-01 – 2019-09-07 (×2): 1 via NASAL
  Filled 2019-09-01: qty 44

## 2019-09-01 MED ORDER — FUROSEMIDE 10 MG/ML IJ SOLN
60.0000 mg | Freq: Once | INTRAMUSCULAR | Status: AC
  Administered 2019-09-01: 60 mg via INTRAVENOUS
  Filled 2019-09-01: qty 6

## 2019-09-01 MED ORDER — PERFLUTREN LIPID MICROSPHERE
1.0000 mL | INTRAVENOUS | Status: AC | PRN
  Administered 2019-09-01: 2 mL via INTRAVENOUS
  Filled 2019-09-01: qty 10

## 2019-09-01 NOTE — Progress Notes (Signed)
PROGRESS NOTE  David Cantrell MLY:[id number] DOB: [birth year] DOA: 09/10/2019  PCP: [name], MD  Brief History/Interval Summary: 79 y.o. male past medical history of COPD bronchiectasis essential hypertension chronic kidney disease stage IIIa who started having body aches on 08/19/2019.  Apparently tested positive for COVID-19 on August 13.  He was apparently set up for monoclonal antibody infusion but was noted to be tachypneic tachycardic and hypoxic and so he was transferred to the emergency department.  Hospitalized for further management.    Reason for Visit: Acute respiratory failure with hypoxia.  Pneumonia due to COVID-19.  Consultants: None yet  Procedures: None  Antibiotics: Anti-infectives (From admission, onward)   Start     Dose/Rate Route Frequency Ordered Stop   08/30/19 1000  remdesivir 100 mg in sodium chloride 0.9 % 100 mL IVPB       "Followed by" Linked Group Details   100 mg 200 mL/hr over 30 Minutes Intravenous Daily 09/04/2019 1247 09/03/19 0959   08/30/19 1000  remdesivir 100 mg in sodium chloride 0.9 % 100 mL IVPB  Status:  Discontinued       "Followed by" Linked Group Details   100 mg 200 mL/hr over 30 Minutes Intravenous Daily 08/19/2019 1518 09/05/2019 1621   08/13/2019 1600  remdesivir 200 mg in sodium chloride 0.9% 250 mL IVPB  Status:  Discontinued       "Followed by" Linked Group Details   200 mg 580 mL/hr over 30 Minutes Intravenous Once 09/04/2019 1518 08/23/2019 1621   08/21/2019 1400  remdesivir 200 mg in sodium chloride 0.9% 250 mL IVPB       "Followed by" Linked Group Details   200 mg 580 mL/hr over 30 Minutes Intravenous Once 08/26/2019 1247 08/15/2019 1731      Subjective/Interval History: Patient continues to have difficulty breathing.  Continues to feel fatigued.  Denies any chest pain.  No nausea vomiting.  Denies any new symptoms currently.     Assessment/Plan:  Acute Hypoxic Resp. Failure/Pneumonia due to COVID-19  Recent Labs  Lab  09/06/2019 1003 08/30/19 0442 08/31/19 0509 09/01/19 0215  DDIMER 1.23*  --  >20.00* >20.00*  FERRITIN 484*  --   --   --   CRP 13.5* 17.3* 11.0* 7.7*  ALT 40 32 31 47*  PROCALCITON <0.10  --   --   --     Objective findings: Fever: Remains afebrile Oxygen requirements: Remains on heated HFNC at 50 L/min along with a nonrebreather.  Saturating in the late 80s to early 90s.  COVID 19 Therapeutics: Antibacterials: None.  Procalcitonin was less than 0.1 Remdesivir: Day 4 Steroids: Solu-Medrol 50 mg twice a day Diuretics: None Actemra/Baricitinib: On baricitinib PUD Prophylaxis: Pepcid DVT Prophylaxis: Therapeutic Lovenox   Patient continues to be on high amounts of oxygen.  Respiratory status remains very tenuous.  He is on Remdesivir, steroids and baricitinib.  His inflammatory marker is improving with CRP down to 7.7.  D-dimer remains greater than 20.  See below.  Patient responded well to furosemide yesterday and so will be repeated.  Monitor ins and outs.  Monitor his renal function closely.  Patient remains at high risk for decompensation Encourage incentive spirometry prone positioning as much as possible.  Mobilization as much as possible.  Acute bilateral lower extremity DVT/presumed pulmonary embolism Patient noted to have significantly elevated D-dimer yesterday.  Lower extremity Doppler study showed DVT in both legs.  Patient did have mild hemoptysis.  He likely has acute PE as  well.  Could not do CT angiogram due to elevated creatinine as well as order tenuous respiratory status.  Doing CT scan will not change management.  Continue therapeutic Lovenox.  Chronic kidney disease stage IIIa Creatinine stable for the most part.  Continue to monitor while he is being diuresed.  Potassium noted to be 5.6 this morning.  We will recheck later today.  He is not getting any potassium supplementation at this time.  History of COPD and bronchiectasis Patient not on oxygen at home.   Currently not wheezing.  On albuterol MDI four times daily.  Essential hypertension Monitor blood pressures closely.  Not on any antihypertensives at home.  Mild transaminitis Secondary to COVID-19.  Stable.  Normocytic anemia No evidence of overt bleeding.  Likely due to chronic disease.  Hemoglobin has been stable for the last several days.  History of prostate cancer Stable.  History of thoracic aortic aneurysm Stable.  DVT Prophylaxis: Lovenox Code Status: Full code Family Communication: Discussed with the patient.  Wife being updated daily. Disposition Plan: Hopefully return home when improved.  Status is: Inpatient  Remains inpatient appropriate because:IV treatments appropriate due to intensity of illness or inability to take PO and Inpatient level of care appropriate due to severity of illness   Dispo:  Patient From: Home  Planned Disposition: To be determined  Expected discharge date: 09/03/19  Medically stable for discharge: No       Medications:  Scheduled:  albuterol  2 puff Inhalation Q6H   alfuzosin  10 mg Oral Daily   aspirin EC  81 mg Oral Daily   baricitinib  4 mg Oral Daily   brimonidine  1 drop Right Eye BID   Chlorhexidine Gluconate Cloth  6 each Topical Daily   dorzolamide  1 drop Right Eye BID   enoxaparin (LOVENOX) injection  1 mg/kg Subcutaneous Q12H   famotidine  20 mg Oral Daily   fenofibrate  160 mg Oral Daily   latanoprost  1 drop Both Eyes QHS   mouth rinse  15 mL Mouth Rinse BID   methylPREDNISolone (SOLU-MEDROL) injection  0.5 mg/kg Intravenous Q12H   rosuvastatin  10 mg Oral Once per day on Mon Thu   Continuous:  remdesivir 100 mg in NS 100 mL Stopped (09/01/19 1017)   NKN:LZJQBHALPFXTKWIO-XBDZHGDJMEQ, guaiFENesin-dextromethorphan, nitroGLYCERIN, polyethylene glycol   Objective:  Vital Signs  Vitals:   09/01/19 0800 09/01/19 0900 09/01/19 1000 09/01/19 1100  BP: 134/69 131/86 (!) 141/67 (!) 145/62    Pulse: 73 70 75 76  Resp: (!) 33 (!) 26 20 (!) 24  Temp: 97.7 F (36.5 C)     TempSrc: Axillary     SpO2: (!) 89% (!) 88% (!) 85% 92%  Weight:      Height:        Intake/Output Summary (Last 24 hours) at 09/01/2019 1141 Last data filed at 09/01/2019 1100 Gross per 24 hour  Intake 190.58 ml  Output 2100 ml  Net -1909.42 ml   Filed Weights   08/13/2019 0937 09/10/2019 0940  Weight: 99.8 kg 99.8 kg    General appearance: Awake alert.  In no distress Resp: Noted to be tachypneic with use of accessory muscles at times.  Coarse breath sounds with crackles bilateral bases.  No wheezing or rhonchi.   Cardio: S1-S2 is normal regular.  No S3-S4.  No rubs murmurs or bruit GI: Abdomen is soft.  Nontender nondistended.  Bowel sounds are present normal.  No masses organomegaly Extremities: No significant edema.  Moving all his extremities.. Neurologic: Alert and oriented x3.  No focal neurological deficits.    Lab Results:  Data Reviewed: I have personally reviewed following labs and imaging studies  CBC: Recent Labs  Lab 09/12/2019 1003 08/15/2019 1819 08/30/19 0442 08/31/19 0509 09/01/19 0215  WBC 8.7 6.8 6.3 7.6 8.2  NEUTROABS 7.8*  --  5.6 6.5 6.8  HGB 10.5* 8.7* 10.2* 10.5* 10.8*  HCT 31.6* 26.6* 30.6* 31.4* 33.1*  MCV 92.9 95.0 93.9 91.8 94.0  PLT 253 203 265 286 818    Basic Metabolic Panel: Recent Labs  Lab 08/28/2019 1003 09/02/2019 1819 08/30/19 0442 08/31/19 0509 09/01/19 0215  NA 139  --  138 140 141  K 4.5  --  4.4 4.1 5.6*  CL 102  --  106 104 105  CO2 20*  --  21* 23 24  GLUCOSE 152*  --  156* 155* 157*  BUN 38*  --  38* 53* 50*  CREATININE 1.69* 1.24 1.27* 1.46* 1.38*  CALCIUM 9.3  --  8.4* 8.4* 8.7*    GFR: Estimated Creatinine Clearance: 55 mL/min (A) (by C-G formula based on SCr of 1.38 mg/dL (H)).  Liver Function Tests: Recent Labs  Lab 09/05/2019 1003 08/30/19 0442 08/31/19 0509 09/01/19 0215  AST 54* 48* 50* 65*  ALT 40 32 31 47*  ALKPHOS 59  57 65 74  BILITOT 0.5 0.4 0.6 0.7  PROT 7.4 7.1 6.9 7.0  ALBUMIN 2.9* 2.3* 2.5* 2.4*     Recent Results (from the past 240 hour(s))  SARS Coronavirus 2 by RT PCR (hospital order, performed in [place] hospital lab) Nasopharyngeal Nasopharyngeal Swab     Status: Abnormal   Collection Time: 08/23/2019 10:29 AM   Specimen: Nasopharyngeal Swab  Result Value Ref Range Status   SARS Coronavirus 2 POSITIVE (A) NEGATIVE Final    Comment: RESULT CALLED TO, READ BACK BY AND VERIFIED WITH: GRANT,K. RN @1220  ON 8.17.2021 BY NMCCOY (NOTE) SARS-CoV-2 target nucleic acids are DETECTED  SARS-CoV-2 RNA is generally detectable in upper respiratory specimens  during the acute phase of infection.  Positive results are indicative  of the presence of the identified virus, but do not rule out bacterial infection or co-infection with other pathogens not detected by the test.  Clinical correlation with patient history and  other diagnostic information is necessary to determine patient infection status.  The expected result is negative.  Fact Sheet for Patients:   StrictlyIdeas.no   Fact Sheet for Healthcare Providers:   BankingDealers.co.za    This test is not yet approved or cleared by the [place] FDA and  has been authorized for detection and/or diagnosis of SARS-CoV-2 by FDA under an Emergency Use Authorization (EUA).  This EUA will remain in effect (meaning t his test can be used) for the duration of  the COVID-19 declaration under Section 564(b)(1) of the Act, 21 U.S.C. section 360-bbb-3(b)(1), unless the authorization is terminated or revoked sooner.  Performed at [facility], [place] [address] [place], [place] [address]   Blood Culture (routine x 2)     Status: None (Preliminary result)   Collection Time: 08/19/2019 10:42 AM   Specimen: Left Antecubital; Blood  Result Value Ref Range Status   Specimen Description    Final    LEFT ANTECUBITAL Performed at [facility] [address], [place], [place] [address]    Special Requests   Final    BOTTLES DRAWN AEROBIC AND ANAEROBIC Blood Culture results  may not be optimal due to an excessive volume of blood received in culture bottles Performed at [facility] [address], [place], [place] [address]    Culture   Final    NO GROWTH 3 DAYS Performed at [facility], [place] [address] [place], [place] [address]    Report Status PENDING  Incomplete  Blood Culture (routine x 2)     Status: None (Preliminary result)   Collection Time: 09/03/2019 10:52 AM   Specimen: Right Antecubital; Blood  Result Value Ref Range Status   Specimen Description   Final    RIGHT ANTECUBITAL Performed at [facility] [address], [place], [place] [address]    Special Requests   Final    BOTTLES DRAWN AEROBIC AND ANAEROBIC Blood Culture results may not be optimal due to an excessive volume of blood received in culture bottles Performed at [facility] [address] [place], [place] [address]    Culture   Final    NO GROWTH 3 DAYS Performed at [facility], [place] [address], [place], [place] [address]    Report Status PENDING  Incomplete  MRSA PCR Screening     Status: None   Collection Time: 08/31/19 12:00 PM   Specimen: Nasal Mucosa; Nasopharyngeal  Result Value Ref Range Status   MRSA by PCR NEGATIVE NEGATIVE Final    Comment:        The GeneXpert MRSA Assay (FDA approved for NASAL specimens only), is one component of a comprehensive MRSA colonization surveillance program. It is not intended to diagnose MRSA infection nor to guide or monitor treatment for MRSA infections. Performed at [facility], [place] [address], [place],  [address]       Radiology Studies: VAS [place] LOWER EXTREMITY VENOUS (DVT)  Result Date: 08/31/2019  Lower Venous  DVTStudy Indications: Swelling.  Risk Factors: COVID 19 positive. Comparison Study: No prior studies. Performing Technologist: Oliver Hum RVT  Examination Guidelines: A complete evaluation includes B-mode imaging, spectral Doppler, color Doppler, and power Doppler as needed of all accessible portions of each vessel. Bilateral testing is considered an integral part of a complete examination. Limited examinations for reoccurring indications may be performed as noted. The reflux portion of the exam is performed with the patient in reverse Trendelenburg.  +---------+---------------+---------+-----------+----------+--------------+  RIGHT     Compressibility Phasicity Spontaneity Properties Thrombus Aging  +---------+---------------+---------+-----------+----------+--------------+  CFV       Full            Yes       Yes                                    +---------+---------------+---------+-----------+----------+--------------+  SFJ       Full                                                             +---------+---------------+---------+-----------+----------+--------------+  FV Prox   Full                                                             +---------+---------------+---------+-----------+----------+--------------+  FV Mid    Full                                                             +---------+---------------+---------+-----------+----------+--------------+  FV Distal Full                                                             +---------+---------------+---------+-----------+----------+--------------+  PFV       Full                                                             +---------+---------------+---------+-----------+----------+--------------+  POP       Full            Yes       Yes                                    +---------+---------------+---------+-----------+----------+--------------+  PTV       Partial                                          Acute            +---------+---------------+---------+-----------+----------+--------------+  PERO      None                                             Acute           +---------+---------------+---------+-----------+----------+--------------+   +---------+---------------+---------+-----------+----------+--------------+  LEFT      Compressibility Phasicity Spontaneity Properties Thrombus Aging  +---------+---------------+---------+-----------+----------+--------------+  CFV       Full            Yes       Yes                                    +---------+---------------+---------+-----------+----------+--------------+  SFJ       Full                                                             +---------+---------------+---------+-----------+----------+--------------+  FV Prox   Full                                                             +---------+---------------+---------+-----------+----------+--------------+  FV Mid    Full                                                             +---------+---------------+---------+-----------+----------+--------------+  FV Distal Full                                                             +---------+---------------+---------+-----------+----------+--------------+  PFV       Full                                                             +---------+---------------+---------+-----------+----------+--------------+  POP       Full            Yes       Yes                                    +---------+---------------+---------+-----------+----------+--------------+  PTV       Partial                                          Acute           +---------+---------------+---------+-----------+----------+--------------+  PERO      Partial                                          Acute           +---------+---------------+---------+-----------+----------+--------------+     Summary: RIGHT: - Findings consistent with acute deep vein thrombosis involving the right posterior tibial veins, and  right peroneal veins. - No cystic structure found in the popliteal fossa.  LEFT: - Findings consistent with acute deep vein thrombosis involving the left posterior tibial veins, and left peroneal veins. - No cystic structure found in the popliteal fossa.  *See table(s) above for measurements and observations. Electronically signed by Servando Snare MD on 08/31/2019 at 2:44:40 PM.    Final        LOS: 3 days   [facility] Pager on [email or web address]  09/01/2019, 11:41 AM

## 2019-09-01 NOTE — Progress Notes (Signed)
  Echocardiogram 2D Echocardiogram has been performed.  Matilde Bash 09/01/2019, 2:23 PM

## 2019-09-01 NOTE — TOC Initial Note (Signed)
Transition of Care [facility]) - Initial/Assessment Note    Patient Details  Name: David Cantrell: [id number] Date of Birth: 05/18/1940  [facility]) CM/SW Contact:    Leeroy Cha, RN Phone Number: 09/01/2019, 8:20 AM  Clinical Narrative:                 cvoid 19 infection in a unvaccinated male with hx of copd, and ckd stage 3/ on o2 at 50l/min hfnrm/ Iv solumedrol, remdesivir through 518841, d.dimer is >20.0, crp 7.7, bun 50, creat 1.38 Following for progression and for toc needs.  Expected Discharge Plan: Home/Self Care Barriers to Discharge: Continued Medical Work up   Patient Goals and CMS Choice Patient states their goals for this hospitalization and ongoing recovery are:: i would like to get better and go home CMS Medicare.gov Compare Post Acute Care list provided to:: Patient    Expected Discharge Plan and Services Expected Discharge Plan: Home/Self Care   Discharge Planning Services: CM Consult   Living arrangements for the past 2 months: Single Family Home                                      Prior Living Arrangements/Services Living arrangements for the past 2 months: Single Family Home Lives with:: Spouse Patient language and need for interpreter reviewed:: Yes Do you feel safe going back to the place where you live?: Yes      Need for Family Participation in Patient Care: Yes (Comment) Care giver support system in place?: Yes (comment)   Criminal Activity/Legal Involvement Pertinent to Current Situation/Hospitalization: No - Comment as needed  Activities of Daily Living Home Assistive Devices/Equipment: Dentures (specify type), Eyeglasses (upper/lower dentures) ADL Screening (condition at time of admission) Patient's cognitive ability adequate to safely complete daily activities?: Yes Is the patient deaf or have 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) Does the patient have difficulty seeing, even when wearing glasses/contacts?: Yes Does  the patient have difficulty concentrating, remembering, or making decisions?: No Patient able to express need for assistance with ADLs?: Yes Does the patient have difficulty dressing or bathing?: No Independently performs ADLs?: Yes (appropriate for developmental age) Does the patient have difficulty walking or climbing stairs?: Yes (secondary to shortness of breath) Weakness of Legs: Both Weakness of Arms/Hands: None  Permission Sought/Granted                  Emotional Assessment Appearance:: Appears stated age Attitude/Demeanor/Rapport: Engaged Affect (typically observed): Calm Orientation: : Oriented to Self, Oriented to Place, Oriented to  Time, Oriented to Situation Alcohol / Substance Use: Tobacco Use Psych Involvement: No (comment)  Admission diagnosis:  Hypoxia [R09.02] COVID-19 virus infection [U07.1] Pneumonia due to COVID-19 virus [U07.1, J12.82] Patient Active Problem List   Diagnosis Date Noted  . Pneumonia due to COVID-19 virus 08/24/2019  . Acute respiratory failure with hypoxia ([place]) 08/28/2019  . Upper respiratory symptom 08/22/2019  . Cervical radiculopathy 03/01/2019  . Preop examination 03/01/2019  . Shortness of breath 07/10/2018  . Lightheadedness 07/10/2018  . Sinus bradycardia 06/28/2018  . CKD (chronic kidney disease) stage 3, GFR 30-59 ml/min   . Diverticulitis 10/11/2017  . Lumbar back pain 04/13/2017  . Piriformis syndrome 01/18/2017  . Thoracic aortic aneurysm ([place]) 04/12/2016  . Malignant neoplasm of prostate ([place]) 01/31/2016  . Centrilobular emphysema ([place]) 12/12/2014  . Bronchiectasis without acute exacerbation ([place]) 09/11/2014  . Bulging lumbar  disc 10/03/2013  . Open-angle glaucoma 05/05/2013  . 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 Multiple pulmonary nodules 02/24/2013  . Basal cell carcinoma of ala nasi 02/24/2013  . At risk for obstructive sleep apnea 02/09/2013  . 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 COLONIC POLYPS, HX  OF 12/13/2007  . Hyperlipidemia 12/03/2006  . Essential hypertension 12/03/2006  . Coronary atherosclerosis 12/03/2006  . 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 Nevus, non-neoplastic 09/24/2006  . RHINITIS, ALLERGIC NOS 09/24/2006  . Exercise-induced bronchospasm 09/24/2006   PCP:  [name], MD Pharmacy:   [facility] ([place]), [place] - [address] [place] ([place]) [place] [address] Phone: [phone] Fax: [phone]     Social Determinants of Health (SDOH) Interventions    Readmission Risk Interventions No flowsheet data found.

## 2019-09-01 NOTE — Progress Notes (Signed)
Echocardiogram 2D Echocardiogram has been performed.  David Cantrell 09/01/2019, 2:25 PM

## 2019-09-01 NOTE — Progress Notes (Signed)
PT Cancellation Note  Patient Details Name: David Cantrell MRN: [id number] DOB: [birth year]   Cancelled Treatment:    Reason Eval/Treat Not Completed: Medical issues which prohibited therapy (K+ elevated 5.6 today. RN plans to give lasix to address. Will follow at later time/date as schedule allows and pt able.)  Gwynneth Albright PT, DPT Acute Rehabilitation Services  Office [phone] Pager [phone]  09/01/2019 12:35 PM

## 2019-09-02 LAB — CBC WITH DIFFERENTIAL/PLATELET
Abs Immature Granulocytes: 0.08 10*3/uL — ABNORMAL HIGH (ref 0.00–0.07)
Basophils Absolute: 0 10*3/uL (ref 0.0–0.1)
Basophils Relative: 0 %
Eosinophils Absolute: 0 10*3/uL (ref 0.0–0.5)
Eosinophils Relative: 0 %
HCT: 35.3 % — ABNORMAL LOW (ref 39.0–52.0)
Hemoglobin: 11.3 g/dL — ABNORMAL LOW (ref 13.0–17.0)
Immature Granulocytes: 1 %
Lymphocytes Relative: 7 %
Lymphs Abs: 0.5 10*3/uL — ABNORMAL LOW (ref 0.7–4.0)
MCH: 29.9 pg (ref 26.0–34.0)
MCHC: 32 g/dL (ref 30.0–36.0)
MCV: 93.4 fL (ref 80.0–100.0)
Monocytes Absolute: 0.4 10*3/uL (ref 0.1–1.0)
Monocytes Relative: 5 %
Neutro Abs: 6.5 10*3/uL (ref 1.7–7.7)
Neutrophils Relative %: 87 %
Platelets: 322 10*3/uL (ref 150–400)
RBC: 3.78 MIL/uL — ABNORMAL LOW (ref 4.22–5.81)
RDW: 13.7 % (ref 11.5–15.5)
WBC: 7.5 10*3/uL (ref 4.0–10.5)
nRBC: 0 % (ref 0.0–0.2)

## 2019-09-02 LAB — COMPREHENSIVE METABOLIC PANEL
ALT: 65 U/L — ABNORMAL HIGH (ref 0–44)
AST: 65 U/L — ABNORMAL HIGH (ref 15–41)
Albumin: 2.5 g/dL — ABNORMAL LOW (ref 3.5–5.0)
Alkaline Phosphatase: 82 U/L (ref 38–126)
Anion gap: 17 — ABNORMAL HIGH (ref 5–15)
BUN: 48 mg/dL — ABNORMAL HIGH (ref 8–23)
CO2: 23 mmol/L (ref 22–32)
Calcium: 8.8 mg/dL — ABNORMAL LOW (ref 8.9–10.3)
Chloride: 103 mmol/L (ref 98–111)
Creatinine, Ser: 1.21 mg/dL (ref 0.61–1.24)
GFR calc Af Amer: >60 mL/min (ref >60)
GFR calc non Af Amer: 57 mL/min — ABNORMAL LOW (ref >60)
Glucose, Bld: 149 mg/dL — ABNORMAL HIGH (ref 70–99)
Potassium: 4.3 mmol/L (ref 3.5–5.1)
Sodium: 143 mmol/L (ref 135–145)
Total Bilirubin: 1 mg/dL (ref 0.3–1.2)
Total Protein: 7.1 g/dL (ref 6.5–8.1)

## 2019-09-02 LAB — C-REACTIVE PROTEIN: CRP: 5.1 mg/dL — ABNORMAL HIGH (ref <1.0)

## 2019-09-02 LAB — D-DIMER, QUANTITATIVE: D-Dimer, Quant: >20 ug/mL-FEU — ABNORMAL HIGH (ref 0.00–0.50)

## 2019-09-02 MED ORDER — POLYETHYLENE GLYCOL 3350 17 G PO PACK
17.0000 g | PACK | Freq: Every day | ORAL | Status: DC
  Administered 2019-09-02 – 2019-09-07 (×5): 17 g via ORAL
  Filled 2019-09-02 (×5): qty 1

## 2019-09-02 MED ORDER — SENNA 8.6 MG PO TABS
2.0000 | ORAL_TABLET | Freq: Two times a day (BID) | ORAL | Status: DC
  Administered 2019-09-02 – 2019-09-10 (×12): 17.2 mg via ORAL
  Filled 2019-09-02 (×13): qty 2

## 2019-09-02 MED ORDER — FUROSEMIDE 10 MG/ML IJ SOLN
60.0000 mg | Freq: Once | INTRAMUSCULAR | Status: AC
  Administered 2019-09-02: 60 mg via INTRAVENOUS
  Filled 2019-09-02: qty 6

## 2019-09-02 NOTE — Progress Notes (Signed)
PROGRESS NOTE  David Cantrell KXF:[id number] DOB: December 19, 1940 DOA: 09/03/2019  PCP: [name], MD  Brief History/Interval Summary: 79 y.o. male past medical history of COPD bronchiectasis essential hypertension chronic kidney disease stage IIIa who started having body aches on 08/19/2019.  Apparently tested positive for COVID-19 on August 13.  He was apparently set up for monoclonal antibody infusion but was noted to be tachypneic tachycardic and hypoxic and so he was transferred to the emergency department.  Hospitalized for further management.    Reason for Visit: Acute respiratory failure with hypoxia.  Pneumonia due to COVID-19.  Consultants: None yet  Procedures: None  Antibiotics: Anti-infectives (From admission, onward)   Start     Dose/Rate Route Frequency Ordered Stop   08/30/19 1000  remdesivir 100 mg in sodium chloride 0.9 % 100 mL IVPB       "Followed by" Linked Group Details   100 mg 200 mL/hr over 30 Minutes Intravenous Daily 09/06/2019 1247 09/02/19 0926   08/30/19 1000  remdesivir 100 mg in sodium chloride 0.9 % 100 mL IVPB  Status:  Discontinued       "Followed by" Linked Group Details   100 mg 200 mL/hr over 30 Minutes Intravenous Daily 08/22/2019 1518 09/10/2019 1621   08/25/2019 1600  remdesivir 200 mg in sodium chloride 0.9% 250 mL IVPB  Status:  Discontinued       "Followed by" Linked Group Details   200 mg 580 mL/hr over 30 Minutes Intravenous Once 08/13/2019 1518 09/02/2019 1621   08/28/2019 1400  remdesivir 200 mg in sodium chloride 0.9% 250 mL IVPB       "Followed by" Linked Group Details   200 mg 580 mL/hr over 30 Minutes Intravenous Once 08/19/2019 1247 08/31/2019 1731      Subjective/Interval History: Patient continues to have difficulty breathing and feels fatigued.  No chest pain.  Understands that this will be a long process of recovery.     Assessment/Plan:  Acute Hypoxic Resp. Failure/Pneumonia due to COVID-19  Recent Labs  Lab 08/19/2019 1003  08/30/19 0442 08/31/19 0509 09/01/19 0215 09/02/19 0224  DDIMER 1.23*  --  >20.00* >20.00* >20.00*  FERRITIN 484*  --   --   --   --   CRP 13.5* 17.3* 11.0* 7.7* 5.1*  ALT 40 32 31 47* 65*  PROCALCITON <0.10  --   --   --   --     Objective findings: Fever: Remains afebrile Oxygen requirements: Remains on heated HFNC at 50 L.  Also on nonrebreather.  Saturating in the late mid 80s to low 90s.    COVID 19 Therapeutics: Antibacterials: None.  Procalcitonin was less than 0.1 Remdesivir: Day 5 Steroids: Solu-Medrol 50 mg twice a day Diuretics: None Actemra/Baricitinib: On baricitinib PUD Prophylaxis: Pepcid DVT Prophylaxis: Therapeutic Lovenox   Patient continues to require high amounts of oxygen.  Respiratory status remains tenuous.  This is secondary to his pneumonia due to COVID-19 as well as presumed pulmonary embolism.  Inflammatory marker is improving.  D-dimer remains greater than 20.  Patient seen to diurese well with Lasix which will be continued for now.  Continue to monitor ins and outs.  Renal function is stable.  Patient remains at high risk for decompensation.  Continue incentive spirometry mobilization as much as possible.  Acute bilateral lower extremity DVT/presumed pulmonary embolism Patient noted to have significantly elevated D-dimer.  Lower extremity Doppler study showed acute DVT in both legs.  Patient did have mild hemoptysis.  He likely has acute PE as well.  Could not do CT angiogram due to elevated creatinine as well as order tenuous respiratory status.  Doing CT scan will not change management.  Continue therapeutic Lovenox.  DVT remains significantly elevated.  Continue to trend.  Echocardiogram shows mildly reduced RV function with increase in size.  Left ventricular function is normal.  Chronic kidney disease stage IIIa Creatinine remained stable.  Potassium is normal.  Continue to monitor urine output while he is getting diuresed.    History of COPD and  bronchiectasis Patient not on oxygen at home.  Currently not wheezing.  On albuterol MDI four times daily.  Essential hypertension Monitor blood pressures closely.  Not on any antihypertensives at home.  Mild transaminitis Secondary to COVID-19.  Stable.  Normocytic anemia No evidence of overt bleeding.  Likely due to chronic disease.  Hemoglobin has been stable for the last several days.  History of prostate cancer Stable.  History of thoracic aortic aneurysm Stable.  DVT Prophylaxis: Lovenox Code Status: Full code Family Communication: Discussed with the patient.  Wife being updated daily. Disposition Plan: Hopefully return home when improved.  Status is: Inpatient  Remains inpatient appropriate because:IV treatments appropriate due to intensity of illness or inability to take PO and Inpatient level of care appropriate due to severity of illness   Dispo:  Patient From: Home  Planned Disposition: To be determined  Expected discharge date: 09/03/19  Medically stable for discharge: No        Medications:  Scheduled: . albuterol  2 puff Inhalation Q6H  . alfuzosin  10 mg Oral Daily  . aspirin EC  81 mg Oral Daily  . baricitinib  4 mg Oral Daily  . brimonidine  1 drop Right Eye BID  . Chlorhexidine Gluconate Cloth  6 each Topical Daily  . dorzolamide  1 drop Right Eye BID  . enoxaparin (LOVENOX) injection  1 mg/kg Subcutaneous Q12H  . famotidine  20 mg Oral Daily  . fenofibrate  160 mg Oral Daily  . furosemide  60 mg Intravenous Once  . latanoprost  1 drop Both Eyes QHS  . mouth rinse  15 mL Mouth Rinse BID  . methylPREDNISolone (SOLU-MEDROL) injection  0.5 mg/kg Intravenous Q12H  . rosuvastatin  10 mg Oral Once per day on Mon Thu   Continuous:  UJW:[id number]-MVHQIONGEXB, guaiFENesin-dextromethorphan, nitroGLYCERIN, polyethylene glycol, sodium chloride   Objective:  Vital Signs  Vitals:   09/02/19 0809 09/02/19 0848 09/02/19 0900 09/02/19 1000    BP:   139/69 132/65  Pulse:   75 86  Resp:   20 (!) 28  Temp:  97.9 F (36.6 C)    TempSrc:  Oral    SpO2: (!) 86%  90% (!) 89%  Weight:      Height:        Intake/Output Summary (Last 24 hours) at 09/02/2019 1137 Last data filed at 09/02/2019 1100 Gross per 24 hour  Intake 193.89 ml  Output 2650 ml  Net -2456.11 ml   Filed Weights   08/21/2019 0937 08/21/2019 0940  Weight: 99.8 kg 99.8 kg    General appearance: Awake alert.  In no distress Resp: Continues to be tachycardic.  Use of accessory muscles.  Coarse breath sounds bilaterally crackles bilateral bases.  No wheezing or rhonchi.   Cardio: S1-S2 is normal regular.  No S3-S4.  No rubs murmurs or bruit GI: Abdomen is soft.  Nontender nondistended.  Bowel sounds are present normal.  No masses organomegaly  Extremities: No edema.  Full range of motion of lower extremities. Neurologic: Alert and oriented x3.  No focal neurological deficits.      Lab Results:  Data Reviewed: I have personally reviewed following labs and imaging studies  CBC: Recent Labs  Lab 09/04/2019 1003 09/08/2019 1003 08/17/2019 1819 08/30/19 0442 08/31/19 0509 09/01/19 0215 09/02/19 0224  WBC 8.7   < > 6.8 6.3 7.6 8.2 7.5  NEUTROABS 7.8*  --   --  5.6 6.5 6.8 6.5  HGB 10.5*   < > 8.7* 10.2* 10.5* 10.8* 11.3*  HCT 31.6*   < > 26.6* 30.6* 31.4* 33.1* 35.3*  MCV 92.9   < > 95.0 93.9 91.8 94.0 93.4  PLT 253   < > 203 265 286 297 322   < > = values in this interval not displayed.    Basic Metabolic Panel: Recent Labs  Lab 08/28/2019 1003 08/14/2019 1003 08/30/2019 1819 08/30/19 0442 08/31/19 0509 09/01/19 0215 09/01/19 1611 09/02/19 0224  NA 139  --   --  138 140 141  --  143  K 4.5   < >  --  4.4 4.1 5.6* 3.8 4.3  CL 102  --   --  106 104 105  --  103  CO2 20*  --   --  21* 23 24  --  23  GLUCOSE 152*  --   --  156* 155* 157*  --  149*  BUN 38*  --   --  38* 53* 50*  --  48*  CREATININE 1.69*   < > 1.24 1.27* 1.46* 1.38*  --  1.21  CALCIUM  9.3  --   --  8.4* 8.4* 8.7*  --  8.8*   < > = values in this interval not displayed.    GFR: Estimated Creatinine Clearance: 62.8 mL/min (by C-G formula based on SCr of 1.21 mg/dL).  Liver Function Tests: Recent Labs  Lab 08/23/2019 1003 08/30/19 0442 08/31/19 0509 09/01/19 0215 09/02/19 0224  AST 54* 48* 50* 65* 65*  ALT 40 32 31 47* 65*  ALKPHOS 59 57 65 74 82  BILITOT 0.5 0.4 0.6 0.7 1.0  PROT 7.4 7.1 6.9 7.0 7.1  ALBUMIN 2.9* 2.3* 2.5* 2.4* 2.5*     Recent Results (from the past 240 hour(s))  SARS Coronavirus 2 by RT PCR (hospital order, performed in [facility] hospital lab) Nasopharyngeal Nasopharyngeal Swab     Status: Abnormal   Collection Time: 09/03/2019 10:29 AM   Specimen: Nasopharyngeal Swab  Result Value Ref Range Status   SARS Coronavirus 2 POSITIVE (A) NEGATIVE Final    Comment: RESULT CALLED TO, READ BACK BY AND VERIFIED WITH: GRANT,K. RN @1220  ON 8.17.2021 BY NMCCOY (NOTE) SARS-CoV-2 target nucleic acids are DETECTED  SARS-CoV-2 RNA is generally detectable in upper respiratory specimens  during the acute phase of infection.  Positive results are indicative  of the presence of the identified virus, but do not rule out bacterial infection or co-infection with other pathogens not detected by the test.  Clinical correlation with patient history and  other diagnostic information is necessary to determine patient infection status.  The expected result is negative.  Fact Sheet for Patients:   StrictlyIdeas.no   Fact Sheet for Healthcare Providers:   BankingDealers.co.za    This test is not yet approved or cleared by the [place] FDA and  has been authorized for detection and/or diagnosis of SARS-CoV-2 by FDA under an Emergency Use Authorization (EUA).  This EUA will remain in effect (meaning t his test can be used) for the duration of  the COVID-19 declaration under Section 564(b)(1) of the Act, 21 U.S.C.  section 360-bbb-3(b)(1), unless the authorization is terminated or revoked sooner.  Performed at [facility], [place] [address], [place], [place] [address]   Blood Culture (routine x 2)     Status: None (Preliminary result)   Collection Time: 08/18/2019 10:42 AM   Specimen: Left Antecubital; Blood  Result Value Ref Range Status   Specimen Description   Final    LEFT ANTECUBITAL Performed at [place] [address] [place], [place] [address]    Special Requests   Final    BOTTLES DRAWN AEROBIC AND ANAEROBIC Blood Culture results may not be optimal due to an excessive volume of blood received in culture bottles Performed at [place] [address], [place], [place] [address]    Culture   Final    NO GROWTH 3 DAYS Performed at [facility], [place] [address], [place], [place] [address]    Report Status PENDING  Incomplete  Blood Culture (routine x 2)     Status: None (Preliminary result)   Collection Time: 09/10/2019 10:52 AM   Specimen: Right Antecubital; Blood  Result Value Ref Range Status   Specimen Description   Final    RIGHT ANTECUBITAL Performed at [place] [address], [place], [place] [address]    Special Requests   Final    BOTTLES DRAWN AEROBIC AND ANAEROBIC Blood Culture results may not be optimal due to an excessive volume of blood received in culture bottles Performed at [place] [address], [place], [place] [address]    Culture   Final    NO GROWTH 3 DAYS Performed at [facility], [place] [address] [place],  [address]    Report Status PENDING  Incomplete  MRSA PCR Screening     Status: None   Collection Time: 08/31/19 12:00 PM   Specimen: Nasal Mucosa; Nasopharyngeal  Result Value Ref Range Status   MRSA by PCR NEGATIVE NEGATIVE Final    Comment:        The GeneXpert MRSA Assay (FDA approved for NASAL specimens only), is one  component of a comprehensive MRSA colonization surveillance program. It is not intended to diagnose MRSA infection nor to guide or monitor treatment for MRSA infections. Performed at [facility], [place] [address], [place],  [address]       Radiology Studies: VAS [place] LOWER EXTREMITY VENOUS (DVT)  Result Date: 08/31/2019  Lower Venous DVTStudy Indications: Swelling.  Risk Factors: COVID 19 positive. Comparison Study: No prior studies. Performing Technologist: Oliver Hum RVT  Examination Guidelines: A complete evaluation includes B-mode imaging, spectral Doppler, color Doppler, and power Doppler as needed of all accessible portions of each vessel. Bilateral testing is considered an integral part of a complete examination. Limited examinations for reoccurring indications may be performed as noted. The reflux portion of the exam is performed with the patient in reverse Trendelenburg.  +---------+---------------+---------+-----------+----------+--------------+ RIGHT    CompressibilityPhasicitySpontaneityPropertiesThrombus Aging +---------+---------------+---------+-----------+----------+--------------+ CFV      Full           Yes      Yes                                 +---------+---------------+---------+-----------+----------+--------------+  SFJ      Full                                                        +---------+---------------+---------+-----------+----------+--------------+ FV Prox  Full                                                        +---------+---------------+---------+-----------+----------+--------------+ FV Mid   Full                                                        +---------+---------------+---------+-----------+----------+--------------+ FV DistalFull                                                        +---------+---------------+---------+-----------+----------+--------------+ PFV      Full                                                         +---------+---------------+---------+-----------+----------+--------------+ POP      Full           Yes      Yes                                 +---------+---------------+---------+-----------+----------+--------------+ PTV      Partial                                      Acute          +---------+---------------+---------+-----------+----------+--------------+ PERO     None                                         Acute          +---------+---------------+---------+-----------+----------+--------------+   +---------+---------------+---------+-----------+----------+--------------+ LEFT     CompressibilityPhasicitySpontaneityPropertiesThrombus Aging +---------+---------------+---------+-----------+----------+--------------+ CFV      Full           Yes      Yes                                 +---------+---------------+---------+-----------+----------+--------------+ SFJ      Full                                                        +---------+---------------+---------+-----------+----------+--------------+  FV Prox  Full                                                        +---------+---------------+---------+-----------+----------+--------------+ FV Mid   Full                                                        +---------+---------------+---------+-----------+----------+--------------+ FV DistalFull                                                        +---------+---------------+---------+-----------+----------+--------------+ PFV      Full                                                        +---------+---------------+---------+-----------+----------+--------------+ POP      Full           Yes      Yes                                 +---------+---------------+---------+-----------+----------+--------------+ PTV      Partial                                      Acute           +---------+---------------+---------+-----------+----------+--------------+ PERO     Partial                                      Acute          +---------+---------------+---------+-----------+----------+--------------+     Summary: RIGHT: - Findings consistent with acute deep vein thrombosis involving the right posterior tibial veins, and right peroneal veins. - No cystic structure found in the popliteal fossa.  LEFT: - Findings consistent with acute deep vein thrombosis involving the left posterior tibial veins, and left peroneal veins. - No cystic structure found in the popliteal fossa.  *See table(s) above for measurements and observations. Electronically signed by Servando Snare MD on 08/31/2019 at 2:44:40 PM.    Final    ECHOCARDIOGRAM LIMITED  Result Date: 09/01/2019    ECHOCARDIOGRAM LIMITED REPORT   Patient Name:   David Cantrell Date of Exam: 09/01/2019 Medical Rec #:  [id number]     Height:       76.0 in Accession #:    [id number]    Weight:       220.0 lb Date of Birth:  1940/01/15      BSA:          2.307 m Patient Age:    80 years      BP:  135/68 mmHg Patient Gender: M             HR:           71 bpm. Exam Location:  Inpatient Procedure: Limited Echo, Cardiac Doppler, Color Doppler and Intracardiac            Opacification Agent Indications:    Pulmonary Embolus  History:        Patient has no prior history of Echocardiogram examinations.                 COPD, Signs/Symptoms:Shortness of Breath; Risk                 Factors:Dyslipidemia and Hypertension. COVID+, CKD.  Sonographer:    Dustin Flock Referring Phys: [phone] [facility]  Sonographer Comments: Technically difficult study due to poor echo windows. Image acquisition challenging due to COPD. IMPRESSIONS  1. Left ventricular ejection fraction, by estimation, is 60 to 65%. The left ventricle has normal function. Left ventricular endocardial border not optimally defined to evaluate regional wall motion.  2. Right  ventricular systolic function is mildly reduced. The right ventricular size is mildly enlarged.  3. The mitral valve is grossly normal. Trivial mitral valve regurgitation.  4. The aortic valve was not well visualized. Aortic valve regurgitation is not visualized. No aortic stenosis is present.  5. The inferior vena cava is normal in size with greater than 50% respiratory variability, suggesting right atrial pressure of 3 mmHg. FINDINGS  Left Ventricle: Left ventricular ejection fraction, by estimation, is 60 to 65%. The left ventricle has normal function. Left ventricular endocardial border not optimally defined to evaluate regional wall motion. Definity contrast agent was given IV to delineate the left ventricular endocardial borders. The left ventricular internal cavity size was normal in size. Right Ventricle: The right ventricular size is mildly enlarged. Right ventricular systolic function is mildly reduced. Left Atrium: Left atrial size was not well visualized. Right Atrium: Right atrial size was not well visualized. Pericardium: The pericardium was not well visualized. Mitral Valve: The mitral valve is grossly normal. Trivial mitral valve regurgitation. Tricuspid Valve: The tricuspid valve is not well visualized. Aortic Valve: The aortic valve was not well visualized. Aortic valve regurgitation is not visualized. No aortic stenosis is present. Aortic valve peak gradient measures 4.0 mmHg. Pulmonic Valve: The pulmonic valve was not assessed. Aorta: The aortic root was not well visualized. Venous: The inferior vena cava is normal in size with greater than 50% respiratory variability, suggesting right atrial pressure of 3 mmHg. IAS/Shunts: No atrial level shunt detected by color flow Doppler.  Diastology LV e' lateral:   7.51 cm/s LV E/e' lateral: 9.9 LV e' medial:    5.55 cm/s LV E/e' medial:  13.4  RIGHT VENTRICLE RV Basal diam:  3.50 cm RV S prime:     5.11 cm/s AORTIC VALVE AV Vmax:      99.90 cm/s AV Peak  Grad: 4.0 mmHg MITRAL VALVE MV Area (PHT): 3.42 cm MV Decel Time: 222 msec MV E velocity: 74.60 cm/s MV A velocity: 83.10 cm/s MV E/A ratio:  0.90 Cherlynn Kaiser MD Electronically signed by Cherlynn Kaiser MD Signature Date/Time: 09/01/2019/5:13:05 PM    Final        LOS: 4 days   Bonnielee Haff  [facility] Pager on [email or web address]  09/02/2019, 11:37 AM

## 2019-09-03 LAB — CBC WITH DIFFERENTIAL/PLATELET
Abs Immature Granulocytes: 0.06 10*3/uL (ref 0.00–0.07)
Basophils Absolute: 0 10*3/uL (ref 0.0–0.1)
Basophils Relative: 0 %
Eosinophils Absolute: 0 10*3/uL (ref 0.0–0.5)
Eosinophils Relative: 0 %
HCT: 33.8 % — ABNORMAL LOW (ref 39.0–52.0)
Hemoglobin: 11.1 g/dL — ABNORMAL LOW (ref 13.0–17.0)
Immature Granulocytes: 1 %
Lymphocytes Relative: 6 %
Lymphs Abs: 0.5 10*3/uL — ABNORMAL LOW (ref 0.7–4.0)
MCH: 30.8 pg (ref 26.0–34.0)
MCHC: 32.8 g/dL (ref 30.0–36.0)
MCV: 93.9 fL (ref 80.0–100.0)
Monocytes Absolute: 0.5 10*3/uL (ref 0.1–1.0)
Monocytes Relative: 5 %
Neutro Abs: 7.5 10*3/uL (ref 1.7–7.7)
Neutrophils Relative %: 88 %
Platelets: 335 10*3/uL (ref 150–400)
RBC: 3.6 MIL/uL — ABNORMAL LOW (ref 4.22–5.81)
RDW: 13.7 % (ref 11.5–15.5)
WBC: 8.5 10*3/uL (ref 4.0–10.5)
nRBC: 0 % (ref 0.0–0.2)

## 2019-09-03 LAB — COMPREHENSIVE METABOLIC PANEL
ALT: 53 U/L — ABNORMAL HIGH (ref 0–44)
AST: 41 U/L (ref 15–41)
Albumin: 2.4 g/dL — ABNORMAL LOW (ref 3.5–5.0)
Alkaline Phosphatase: 80 U/L (ref 38–126)
Anion gap: 13 (ref 5–15)
BUN: 49 mg/dL — ABNORMAL HIGH (ref 8–23)
CO2: 22 mmol/L (ref 22–32)
Calcium: 8.4 mg/dL — ABNORMAL LOW (ref 8.9–10.3)
Chloride: 102 mmol/L (ref 98–111)
Creatinine, Ser: 1.16 mg/dL (ref 0.61–1.24)
GFR calc Af Amer: >60 mL/min (ref >60)
GFR calc non Af Amer: >60 mL/min (ref >60)
Glucose, Bld: 148 mg/dL — ABNORMAL HIGH (ref 70–99)
Potassium: 4.2 mmol/L (ref 3.5–5.1)
Sodium: 137 mmol/L (ref 135–145)
Total Bilirubin: 0.7 mg/dL (ref 0.3–1.2)
Total Protein: 6.6 g/dL (ref 6.5–8.1)

## 2019-09-03 LAB — CULTURE, BLOOD (ROUTINE X 2)
Culture: NO GROWTH
Culture: NO GROWTH

## 2019-09-03 LAB — C-REACTIVE PROTEIN: CRP: 4.4 mg/dL — ABNORMAL HIGH (ref <1.0)

## 2019-09-03 LAB — D-DIMER, QUANTITATIVE: D-Dimer, Quant: 11.78 ug/mL-FEU — ABNORMAL HIGH (ref 0.00–0.50)

## 2019-09-03 MED ORDER — FUROSEMIDE 10 MG/ML IJ SOLN
60.0000 mg | Freq: Two times a day (BID) | INTRAMUSCULAR | Status: AC
  Administered 2019-09-03 (×2): 60 mg via INTRAVENOUS
  Filled 2019-09-03 (×2): qty 6

## 2019-09-03 NOTE — Evaluation (Signed)
Physical Therapy Evaluation Patient Details Name: David Cantrell MRN: [id number] DOB: 10-17-1940 Today's Date: 09/03/2019   History of Present Illness  Pt admiited 2* SOB with COVID PNA and with hx of Prostate CA, Thoracic Aortic Aneurysm, Macular degeneration, COPD, CAD, R RCR, Anterior Cervical Fusion, and CKD  Clinical Impression  Pt admitted as above and presenting with functional mobility limitations 2* generalized weakness, balance deficits and very limited endurance. This date pt up to side of bed to use urinal and up to stand/pvt and step bkwd 2 ft to sit in chair but desating and requiring recovery time after each small task.  Pt very motivated, A&O x3 and eager to progress.    Follow Up Recommendations Home health PT    Equipment Recommendations  Rolling walker with 5" wheels    Recommendations for Other Services OT consult     Precautions / Restrictions Precautions Precautions: Fall;Other (comment) Precaution Comments: Monitor sats Restrictions Weight Bearing Restrictions: No      Mobility  Bed Mobility Overal bed mobility: Needs Assistance Bed Mobility: Supine to Sit     Supine to sit: HOB elevated;Min assist     General bed mobility comments: Increased time and min assist to complete transition to EOB sitting  Transfers Overall transfer level: Needs assistance Equipment used: Rolling walker (2 wheeled) Transfers: Sit to/from Stand Sit to Stand: Min assist;+2 physical assistance;+2 safety/equipment;From elevated surface         General transfer comment: cues for use of UEs to self assist, safe transition position; assist to bring wt up and fwd and to balance in initial standing  Ambulation/Gait Ambulation/Gait assistance: Min assist Gait Distance (Feet): 2 Feet Assistive device: Rolling walker (2 wheeled) Gait Pattern/deviations: Step-to pattern;Decreased step length - right;Decreased step length - left;Shuffle;Trunk flexed Gait velocity: decr    General Gait Details: cues for posture and position from RW; min assist to steady and manage RW  Stairs            Wheelchair Mobility    Modified Rankin (Stroke Patients Only)       Balance Overall balance assessment: Needs assistance Sitting-balance support: No upper extremity supported;Feet supported Sitting balance-Leahy Scale: Fair     Standing balance support: Bilateral upper extremity supported Standing balance-Leahy Scale: Poor                               Pertinent Vitals/Pain Pain Assessment: No/denies pain    Home Living Family/patient expects to be discharged to:: Private residence Living Arrangements: Spouse/significant other Available Help at Discharge: Family;Available 24 hours/day Type of Home: House Home Access: Level entry     Home Layout: One level Home Equipment: None      Prior Function Level of Independence: Independent               Hand Dominance        Extremity/Trunk Assessment   Upper Extremity Assessment Upper Extremity Assessment: Generalized weakness    Lower Extremity Assessment Lower Extremity Assessment: Generalized weakness       Communication   Communication: No difficulties  Cognition Arousal/Alertness: Awake/alert Behavior During Therapy: WFL for tasks assessed/performed Overall Cognitive Status: Within Functional Limits for tasks assessed                                        General Comments  Exercises     Assessment/Plan    PT Assessment Patient needs continued PT services  PT Problem List Decreased strength;Decreased activity tolerance;Decreased balance;Decreased mobility;Decreased knowledge of use of DME       PT Treatment Interventions DME instruction;Gait training;Functional mobility training;Therapeutic activities;Therapeutic exercise;Patient/family education;Balance training    PT Goals (Current goals can be found in the Care Plan section)  Acute  Rehab PT Goals Patient Stated Goal: Regain IND PT Goal Formulation: With patient Time For Goal Achievement: 09/17/19 Potential to Achieve Goals: Good    Frequency Min 3X/week   Barriers to discharge        Co-evaluation               AM-PAC PT "6 Clicks" Mobility  Outcome Measure Help needed turning from your back to your side while in a flat bed without using bedrails?: A Little Help needed moving from lying on your back to sitting on the side of a flat bed without using bedrails?: A Little Help needed moving to and from a bed to a chair (including a wheelchair)?: A Little Help needed standing up from a chair using your arms (e.g., wheelchair or bedside chair)?: A Little Help needed to walk in hospital room?: A Lot Help needed climbing 3-5 steps with a railing? : Total 6 Click Score: 15    End of Session Equipment Utilized During Treatment: Oxygen Activity Tolerance: Patient limited by fatigue;Patient tolerated treatment well Patient left: in chair;with call bell/phone within reach Nurse Communication: Mobility status PT Visit Diagnosis: Unsteadiness on feet (R26.81);Muscle weakness (generalized) (M62.81);Difficulty in walking, not elsewhere classified (R26.2)    Time: 1355-1445 PT Time Calculation (min) (ACUTE ONLY): 50 min   Charges:   PT Evaluation $PT Eval Moderate Complexity: 1 Mod PT Treatments $Gait Training: 8-22 mins $Therapeutic Activity: 8-22 mins        Debe Coder PT Acute Rehabilitation Services Pager [phone] Office [phone]   Collin Hendley 09/03/2019, 4:59 PM

## 2019-09-03 NOTE — Progress Notes (Addendum)
PROGRESS NOTE  David Cantrell QIW:[id number] DOB: 11/22/1940 DOA: 08/13/2019  PCP: [name], MD  Brief History/Interval Summary: 79 y.o. male past medical history of COPD bronchiectasis essential hypertension chronic kidney disease stage IIIa who started having body aches on 08/19/2019.  Apparently tested positive for COVID-19 on August 13.  He was apparently set up for monoclonal antibody infusion but was noted to be tachypneic tachycardic and hypoxic and so he was transferred to the emergency department.  Hospitalized for further management.    Reason for Visit: Acute respiratory failure with hypoxia.  Pneumonia due to COVID-19.  Consultants: None yet  Procedures: None  Antibiotics: Anti-infectives (From admission, onward)   Start     Dose/Rate Route Frequency Ordered Stop   08/30/19 1000  remdesivir 100 mg in sodium chloride 0.9 % 100 mL IVPB       "Followed by" Linked Group Details   100 mg 200 mL/hr over 30 Minutes Intravenous Daily 09/10/2019 1247 09/02/19 0926   08/30/19 1000  remdesivir 100 mg in sodium chloride 0.9 % 100 mL IVPB  Status:  Discontinued       "Followed by" Linked Group Details   100 mg 200 mL/hr over 30 Minutes Intravenous Daily 08/20/2019 1518 08/18/2019 1621   08/26/2019 1600  remdesivir 200 mg in sodium chloride 0.9% 250 mL IVPB  Status:  Discontinued       "Followed by" Linked Group Details   200 mg 580 mL/hr over 30 Minutes Intravenous Once 08/23/2019 1518 09/03/2019 1621   08/13/2019 1400  remdesivir 200 mg in sodium chloride 0.9% 250 mL IVPB       "Followed by" Linked Group Details   200 mg 580 mL/hr over 30 Minutes Intravenous Once 08/22/2019 1247 08/21/2019 1731      Subjective/Interval History: Patient states that he is about the same as yesterday.  Denies any new complaints.  Continues to have shortness of breath.  No chest pain.  No nausea vomiting.  Denies any dizziness or lightheadedness.     Assessment/Plan:  Acute Hypoxic Resp. Failure/Pneumonia  due to COVID-19  Recent Labs  Lab 09/04/2019 1003 08/24/2019 1003 08/30/19 0442 08/31/19 0509 09/01/19 0215 09/02/19 0224 09/03/19 0225  DDIMER 1.23*  --   --  >20.00* >20.00* >20.00* 11.78*  FERRITIN 484*  --   --   --   --   --   --   CRP 13.5*   < > 17.3* 11.0* 7.7* 5.1* 4.4*  ALT 40   < > 32 31 47* 65* 53*  PROCALCITON <0.10  --   --   --   --   --   --    < > = values in this interval not displayed.    Objective findings: Fever: Remains afebrile Oxygen requirements: Continues to be on heated HFNC at 50 L.  Also on nonrebreather.  Saturating in the late 80s to early 90s.  COVID 19 Therapeutics: Antibacterials: None.  Procalcitonin was less than 0.1 Remdesivir: Completed 5 days course on 8/21 Steroids: Solu-Medrol 50 mg twice a day Diuretics: Lasix being dosed daily depending on clinical condition.  Additional dose to be given today Actemra/Baricitinib: On baricitinib, started on 8/17 PUD Prophylaxis: Pepcid DVT Prophylaxis: Therapeutic Lovenox    Patient continues to require high amounts of oxygen.  His work of breathing seems to be slightly better today compared to yesterday.  Saturations are more consistently in the 90s.  Continue with steroids.  He has completed course of Remdesivir.  He remains on baricitinib.  Remains on therapeutic Lovenox for presumed PE.  Continue with incentive spirometry and mobilization as much as possible.  Patient was sitting out of bed in chair for most of the afternoon yesterday. Give Lasix x1.   CRP is better at 4.4.  Acute bilateral lower extremity DVT/presumed pulmonary embolism Patient noted to have significantly elevated D-dimer.  Lower extremity Doppler study showed acute DVT in both legs.  Patient did have mild hemoptysis.  He likely has acute PE as well.  Could not do CT angiogram due to elevated creatinine as well as order tenuous respiratory status.  Doing CT scan will not change management.  Continue therapeutic Lovenox.  Echocardiogram  shows mildly reduced RV function with increase in size.  Left ventricular function is normal. D-dimer noted to be slightly better today.  Chronic kidney disease stage IIIa Renal function has improved in the last several days and is stable.  Potassium remains normal.  Monitor urine output.    History of COPD and bronchiectasis Patient not on oxygen at home.  Currently not wheezing.  On albuterol MDI four times daily.  Essential hypertension Monitor blood pressures closely.  Not on any antihypertensives at home.  Mild transaminitis Secondary to COVID-19.  Stable.  No need to check daily LFTs.  Normocytic anemia No evidence of overt bleeding.  Likely due to chronic disease.  Hemoglobin remained stable.    History of prostate cancer Stable.  History of thoracic aortic aneurysm Stable.  DVT Prophylaxis: Lovenox Code Status: Full code Family Communication: Discussed with the patient.  Wife being updated daily. Disposition Plan: Hopefully return home when improved.  Status is: Inpatient  Remains inpatient appropriate because:IV treatments appropriate due to intensity of illness or inability to take PO and Inpatient level of care appropriate due to severity of illness   Dispo:  Patient From: Home  Planned Disposition: To be determined  Expected discharge date: 09/06/19  Medically stable for discharge: No        Medications:  Scheduled: . albuterol  2 puff Inhalation Q6H  . alfuzosin  10 mg Oral Daily  . aspirin EC  81 mg Oral Daily  . baricitinib  4 mg Oral Daily  . brimonidine  1 drop Right Eye BID  . Chlorhexidine Gluconate Cloth  6 each Topical Daily  . dorzolamide  1 drop Right Eye BID  . enoxaparin (LOVENOX) injection  1 mg/kg Subcutaneous Q12H  . famotidine  20 mg Oral Daily  . fenofibrate  160 mg Oral Daily  . latanoprost  1 drop Both Eyes QHS  . mouth rinse  15 mL Mouth Rinse BID  . methylPREDNISolone (SOLU-MEDROL) injection  0.5 mg/kg Intravenous Q12H  .  polyethylene glycol  17 g Oral Daily  . rosuvastatin  10 mg Oral Once per day on Mon Thu  . senna  2 tablet Oral BID   Continuous:  QQP:[id number], guaiFENesin-dextromethorphan, nitroGLYCERIN, sodium chloride   Objective:  Vital Signs  Vitals:   09/03/19 0700 09/03/19 0711 09/03/19 0808 09/03/19 0825  BP: (!) 147/84     Pulse: (!) 51     Resp: 20     Temp:   98.2 F (36.8 C)   TempSrc:   Axillary   SpO2: 93%   90%  Weight:  92 kg    Height:        Intake/Output Summary (Last 24 hours) at 09/03/2019 1102 Last data filed at 09/03/2019 0808 Gross per 24 hour  Intake 290 ml  Output 1250 ml  Net -960 ml   Filed Weights   09/07/2019 0937 08/17/2019 0940 09/03/19 0711  Weight: 99.8 kg 99.8 kg 92 kg    General appearance: Awake alert.  In no distress.  Noted to be somewhat fatigued. Resp: Tachypneic.  No use of accessory muscles.  Crackles bilateral bases.  Somewhat improved air entry noted today on exam. Cardio: S1-S2 is normal regular.  No S3-S4.  No rubs murmurs or bruit GI: Abdomen is soft.  Nontender nondistended.  Bowel sounds are present normal.  No masses organomegaly Extremities: No edema.  Full range of motion of lower extremities. Neurologic: Alert and oriented x3.  No focal neurological deficits.      Lab Results:  Data Reviewed: I have personally reviewed following labs and imaging studies  CBC: Recent Labs  Lab 08/30/19 0442 08/31/19 0509 09/01/19 0215 09/02/19 0224 09/03/19 0225  WBC 6.3 7.6 8.2 7.5 8.5  NEUTROABS 5.6 6.5 6.8 6.5 7.5  HGB 10.2* 10.5* 10.8* 11.3* 11.1*  HCT 30.6* 31.4* 33.1* 35.3* 33.8*  MCV 93.9 91.8 94.0 93.4 93.9  PLT 265 286 297 322 284    Basic Metabolic Panel: Recent Labs  Lab 08/30/19 0442 08/30/19 0442 08/31/19 0509 09/01/19 0215 09/01/19 1611 09/02/19 0224 09/03/19 0225  NA 138  --  140 141  --  143 137  K 4.4   < > 4.1 5.6* 3.8 4.3 4.2  CL 106  --  104 105  --  103 102  CO2 21*  --  23 24  --   23 22  GLUCOSE 156*  --  155* 157*  --  149* 148*  BUN 38*  --  53* 50*  --  48* 49*  CREATININE 1.27*  --  1.46* 1.38*  --  1.21 1.16  CALCIUM 8.4*  --  8.4* 8.7*  --  8.8* 8.4*   < > = values in this interval not displayed.    GFR: Estimated Creatinine Clearance: 65.5 mL/min (by C-G formula based on SCr of 1.16 mg/dL).  Liver Function Tests: Recent Labs  Lab 08/30/19 0442 08/31/19 0509 09/01/19 0215 09/02/19 0224 09/03/19 0225  AST 48* 50* 65* 65* 41  ALT 32 31 47* 65* 53*  ALKPHOS 57 65 74 82 80  BILITOT 0.4 0.6 0.7 1.0 0.7  PROT 7.1 6.9 7.0 7.1 6.6  ALBUMIN 2.3* 2.5* 2.4* 2.5* 2.4*     Recent Results (from the past 240 hour(s))  SARS Coronavirus 2 by RT PCR (hospital order, performed in [facility] hospital lab) Nasopharyngeal Nasopharyngeal Swab     Status: Abnormal   Collection Time: 09/05/2019 10:29 AM   Specimen: Nasopharyngeal Swab  Result Value Ref Range Status   SARS Coronavirus 2 POSITIVE (A) NEGATIVE Final    Comment: RESULT CALLED TO, READ BACK BY AND VERIFIED WITH: GRANT,K. RN @1220  ON 8.17.2021 BY NMCCOY (NOTE) SARS-CoV-2 target nucleic acids are DETECTED  SARS-CoV-2 RNA is generally detectable in upper respiratory specimens  during the acute phase of infection.  Positive results are indicative  of the presence of the identified virus, but do not rule out bacterial infection or co-infection with other pathogens not detected by the test.  Clinical correlation with patient history and  other diagnostic information is necessary to determine patient infection status.  The expected result is negative.  Fact Sheet for Patients:   StrictlyIdeas.no   Fact Sheet for Healthcare Providers:   BankingDealers.co.za    This test is not yet approved or cleared  by the [place] and  has been authorized for detection and/or diagnosis of SARS-CoV-2 by FDA under an Emergency Use Authorization (EUA).  This EUA  will remain in effect (meaning t his test can be used) for the duration of  the COVID-19 declaration under Section 564(b)(1) of the Act, 21 U.S.C. section 360-bbb-3(b)(1), unless the authorization is terminated or revoked sooner.  Performed at [facility], [place] [address], [place], [place] [address]   Blood Culture (routine x 2)     Status: None   Collection Time: 08/23/2019 10:42 AM   Specimen: Left Antecubital; Blood  Result Value Ref Range Status   Specimen Description   Final    LEFT ANTECUBITAL Performed at [facility] [address] [place], [place] [address]    Special Requests   Final    BOTTLES DRAWN AEROBIC AND ANAEROBIC Blood Culture results may not be optimal due to an excessive volume of blood received in culture bottles Performed at [facility] [address] [place], [place] [address]    Culture   Final    NO GROWTH 5 DAYS Performed at [facility], [place] [address] [place], [place] [address]    Report Status 09/03/2019 FINAL  Final  Blood Culture (routine x 2)     Status: None   Collection Time: 08/22/2019 10:52 AM   Specimen: Right Antecubital; Blood  Result Value Ref Range Status   Specimen Description   Final    RIGHT ANTECUBITAL Performed at [place] [facility] [address], [place], [place] [address]    Special Requests   Final    BOTTLES DRAWN AEROBIC AND ANAEROBIC Blood Culture results may not be optimal due to an excessive volume of blood received in culture bottles Performed at [facility] [address], [place], [place] [address]    Culture   Final    NO GROWTH 5 DAYS Performed at [facility], [place] [address], [place], [place] [address]    Report Status 09/03/2019 FINAL  Final  MRSA PCR Screening     Status: None   Collection Time: 08/31/19 12:00 PM   Specimen: Nasal Mucosa; Nasopharyngeal  Result Value Ref Range Status   MRSA by PCR  NEGATIVE NEGATIVE Final    Comment:        The GeneXpert MRSA Assay (FDA approved for NASAL specimens only), is one component of a comprehensive MRSA colonization surveillance program. It is not intended to diagnose MRSA infection nor to guide or monitor treatment for MRSA infections. Performed at [facility], [place] [address], [place], [place] [address]       Radiology Studies: ECHOCARDIOGRAM LIMITED  Result Date: 09/01/2019    ECHOCARDIOGRAM LIMITED REPORT   Patient Name:   JANTHONY HOLLEMAN Date of Exam: 09/01/2019 Medical Rec #:  [id number]     Height:       76.0 in Accession #:    [id number]    Weight:       220.0 lb Date of Birth:  Oct 10, 1940      BSA:          2.307 m Patient Age:    53 years      BP:           135/68 mmHg Patient Gender: M             HR:           71  bpm. Exam Location:  Inpatient Procedure: Limited Echo, Cardiac Doppler, Color Doppler and Intracardiac            Opacification Agent Indications:    Pulmonary Embolus  History:        Patient has no prior history of Echocardiogram examinations.                 COPD, Signs/Symptoms:Shortness of Breath; Risk                 Factors:Dyslipidemia and Hypertension. COVID+, CKD.  Sonographer:    Dustin Flock Referring Phys: [phone] [facility]  Sonographer Comments: Technically difficult study due to poor echo windows. Image acquisition challenging due to COPD. IMPRESSIONS  1. Left ventricular ejection fraction, by estimation, is 60 to 65%. The left ventricle has normal function. Left ventricular endocardial border not optimally defined to evaluate regional wall motion.  2. Right ventricular systolic function is mildly reduced. The right ventricular size is mildly enlarged.  3. The mitral valve is grossly normal. Trivial mitral valve regurgitation.  4. The aortic valve was not well visualized. Aortic valve regurgitation is not visualized. No aortic stenosis is present.  5. The inferior vena cava is normal in size  with greater than 50% respiratory variability, suggesting right atrial pressure of 3 mmHg. FINDINGS  Left Ventricle: Left ventricular ejection fraction, by estimation, is 60 to 65%. The left ventricle has normal function. Left ventricular endocardial border not optimally defined to evaluate regional wall motion. Definity contrast agent was given IV to delineate the left ventricular endocardial borders. The left ventricular internal cavity size was normal in size. Right Ventricle: The right ventricular size is mildly enlarged. Right ventricular systolic function is mildly reduced. Left Atrium: Left atrial size was not well visualized. Right Atrium: Right atrial size was not well visualized. Pericardium: The pericardium was not well visualized. Mitral Valve: The mitral valve is grossly normal. Trivial mitral valve regurgitation. Tricuspid Valve: The tricuspid valve is not well visualized. Aortic Valve: The aortic valve was not well visualized. Aortic valve regurgitation is not visualized. No aortic stenosis is present. Aortic valve peak gradient measures 4.0 mmHg. Pulmonic Valve: The pulmonic valve was not assessed. Aorta: The aortic root was not well visualized. Venous: The inferior vena cava is normal in size with greater than 50% respiratory variability, suggesting right atrial pressure of 3 mmHg. IAS/Shunts: No atrial level shunt detected by color flow Doppler.  Diastology LV e' lateral:   7.51 cm/s LV E/e' lateral: 9.9 LV e' medial:    5.55 cm/s LV E/e' medial:  13.4  RIGHT VENTRICLE RV Basal diam:  3.50 cm RV S prime:     5.11 cm/s AORTIC VALVE AV Vmax:      99.90 cm/s AV Peak Grad: 4.0 mmHg MITRAL VALVE MV Area (PHT): 3.42 cm MV Decel Time: 222 msec MV E velocity: 74.60 cm/s MV A velocity: 83.10 cm/s MV E/A ratio:  0.90 Cherlynn Kaiser MD Electronically signed by Cherlynn Kaiser MD Signature Date/Time: 09/01/2019/5:13:05 PM    Final        LOS: 5 days   Bonnielee Haff  [facility] Pager on  [email or web address]  09/03/2019, 11:02 AM

## 2019-09-03 NOTE — Progress Notes (Signed)
CCM notified RN that patient had several missed beats in cardiac rhythm. Patient asymptomatic. VS within patient's baseline. EKG performed, result in patient's chart. MD [place] Pink made aware. Will continue to monitor.

## 2019-09-04 LAB — CBC
HCT: 36.4 % — ABNORMAL LOW (ref 39.0–52.0)
Hemoglobin: 12.2 g/dL — ABNORMAL LOW (ref 13.0–17.0)
MCH: 30.8 pg (ref 26.0–34.0)
MCHC: 33.5 g/dL (ref 30.0–36.0)
MCV: 91.9 fL (ref 80.0–100.0)
Platelets: 411 10*3/uL — ABNORMAL HIGH (ref 150–400)
RBC: 3.96 MIL/uL — ABNORMAL LOW (ref 4.22–5.81)
RDW: 13.4 % (ref 11.5–15.5)
WBC: 9.2 10*3/uL (ref 4.0–10.5)
nRBC: 0 % (ref 0.0–0.2)

## 2019-09-04 LAB — D-DIMER, QUANTITATIVE: D-Dimer, Quant: 11.58 ug/mL-FEU — ABNORMAL HIGH (ref 0.00–0.50)

## 2019-09-04 LAB — BASIC METABOLIC PANEL
Anion gap: 16 — ABNORMAL HIGH (ref 5–15)
BUN: 53 mg/dL — ABNORMAL HIGH (ref 8–23)
CO2: 25 mmol/L (ref 22–32)
Calcium: 8.9 mg/dL (ref 8.9–10.3)
Chloride: 101 mmol/L (ref 98–111)
Creatinine, Ser: 1.37 mg/dL — ABNORMAL HIGH (ref 0.61–1.24)
GFR calc Af Amer: 57 mL/min — ABNORMAL LOW (ref >60)
GFR calc non Af Amer: 49 mL/min — ABNORMAL LOW (ref >60)
Glucose, Bld: 159 mg/dL — ABNORMAL HIGH (ref 70–99)
Potassium: 4.1 mmol/L (ref 3.5–5.1)
Sodium: 142 mmol/L (ref 135–145)

## 2019-09-04 LAB — PROTIME-INR
INR: 1.5 — ABNORMAL HIGH (ref 0.8–1.2)
Prothrombin Time: 17.3 seconds — ABNORMAL HIGH (ref 11.4–15.2)

## 2019-09-04 LAB — TROPONIN I (HIGH SENSITIVITY): Troponin I (High Sensitivity): 11 ng/L (ref <18)

## 2019-09-04 MED ORDER — ENSURE ENLIVE PO LIQD
237.0000 mL | Freq: Three times a day (TID) | ORAL | Status: DC
  Administered 2019-09-04 – 2019-09-10 (×17): 237 mL via ORAL

## 2019-09-04 MED ORDER — PHENYLEPHRINE HCL 0.25 % NA SOLN
1.0000 | Freq: Four times a day (QID) | NASAL | Status: DC | PRN
  Administered 2019-09-04: 1 via NASAL
  Filled 2019-09-04: qty 15

## 2019-09-04 MED ORDER — FENTANYL CITRATE (PF) 100 MCG/2ML IJ SOLN
25.0000 ug | Freq: Once | INTRAMUSCULAR | Status: AC
  Administered 2019-09-04: 25 ug via INTRAVENOUS
  Filled 2019-09-04: qty 2

## 2019-09-04 NOTE — TOC Progression Note (Signed)
Transition of Care [facility]) - Progression Note    Patient Details  Name: David Cantrell MRN: [id number] Date of Birth: 02/17/1940  Transition of Care [facility]) CM/SW Contact  Leeroy Cha, RN Phone Number: 09/04/2019, 8:30 AM  Clinical Narrative:    +covid patient with hfnrb mask at 50% fi02, desats to 84%,iv solu medrol, d.dimer and crp remains elevated, hx of ckd stage 3. Bun 53 creat 1.37. Will follow for progression and toc Ned[place].   Expected Discharge Plan: Home/Self Care Barriers to Discharge: Continued Medical Work up  Expected Discharge Plan and Services Expected Discharge Plan: Home/Self Care   Discharge Planning Services: CM Consult   Living arrangements for the past 2 months: Single Family Home                                       Social Determinants of Health (SDOH) Interventions    Readmission Risk Interventions No flowsheet data found.

## 2019-09-04 NOTE — Progress Notes (Addendum)
    BRIEF OVERNIGHT PROGRESS REPORT   SUBJECTIVE: Patient c/o retrosternal chest pain per RN resolved with nitro SL x 3 doses. No reports of associated nausea/vomiting, palpitation, SOB, diaphoresis or radiation to extremity. Patient also had an episode of epistaxis which has since resolved without intervention.  OBJECTIVE:On exam,  he was afebrile with blood pressure 90/48 mm Hg and pulse rate 60 beats/min. There were no focal neurological deficits; he was alert and oriented x4.   ASSESSMENT AND PLAN:  Atypical chest pain ( No dynamic EKG changes) could be due to underlying pumonary process presume PE and COVID Pneumonia. Low suspicion for cardiac etiology however will r/o - Will obtain EKG STAT - STAT troponin if dynamic changes noted on EKG - NTG + morphine PRN chest pain (Hold if hypotensive) - On Anti-coagulation with Lovenox for presumed PE/DVT  Epistaxis - single episode now resolved - Check PT/INR - Afrin nasal spray if recurrent - Continue anticoagulation for presumed PE, will consider holding if bleeding uncontrolled.  Acute Hypoxic Respiratory Failure secondary to COVID - 19 Pneumonia - COPD without evidence of acute exacerbation - Supplemental O2 as needed to maintain O2 saturations 88 to 92% on HHFNC @50L  + non-rebreahter - High risk for intubation - On baricitinib, started on 8/17. Completed 5 days course of Remdesivir on 8/21 - Solu-Medrol 50 mg BID - Follow intermittent ABG and chest x-ray as needed - IV Lasix as blood pressure and renal function permits; currently on Lasix 40 mg IV daily - As needed bronchodilators - Maintain Airborne and contact precautions  CKD Stage III - Monitor I&O's / urinary output - Follow BMP - Ensure adequate renal perfusion - Avoid nephrotoxic agents as able - Replace electrolytes as indicated       Rufina Falco, RN, BSN, MSN, DNP, CCRN, FNP-Board Certified Optometrist Practitioner  [facility]

## 2019-09-04 NOTE — Progress Notes (Signed)
PROGRESS NOTE  David Cantrell ZWC:[id number] DOB: [birth year] DOA: 08/21/2019  PCP: [name], MD  Brief History/Interval Summary: 79 y.o. male past medical history of COPD bronchiectasis essential hypertension chronic kidney disease stage IIIa who started having body aches on 08/19/2019.  Apparently tested positive for COVID-19 on August 13.  He was apparently set up for monoclonal antibody infusion but was noted to be tachypneic tachycardic and hypoxic and so he was transferred to the emergency department.  Hospitalized for further management.    Reason for Visit: Acute respiratory failure with hypoxia.  Pneumonia due to COVID-19.  Consultants: None yet  Procedures: None  Antibiotics: Anti-infectives (From admission, onward)   Start     Dose/Rate Route Frequency Ordered Stop   08/30/19 1000  remdesivir 100 mg in sodium chloride 0.9 % 100 mL IVPB       "Followed by" Linked Group Details   100 mg 200 mL/hr over 30 Minutes Intravenous Daily 09/07/2019 1247 09/02/19 0926   08/30/19 1000  remdesivir 100 mg in sodium chloride 0.9 % 100 mL IVPB  Status:  Discontinued       "Followed by" Linked Group Details   100 mg 200 mL/hr over 30 Minutes Intravenous Daily 08/17/2019 1518 09/10/2019 1621   09/09/2019 1600  remdesivir 200 mg in sodium chloride 0.9% 250 mL IVPB  Status:  Discontinued       "Followed by" Linked Group Details   200 mg 580 mL/hr over 30 Minutes Intravenous Once 09/07/2019 1518 09/07/2019 1621   09/04/2019 1400  remdesivir 200 mg in sodium chloride 0.9% 250 mL IVPB       "Followed by" Linked Group Details   200 mg 580 mL/hr over 30 Minutes Intravenous Once 08/15/2019 1247 08/30/2019 1731      Subjective/Interval History: Overnight events noted.  Patient currently denies any chest pain.  He mentions that he was experiencing sharp stabbing pains in the right chest especially with deep breaths.  No other complaints offered.     Assessment/Plan:  Acute Hypoxic Resp. Failure/Pneumonia  due to COVID-19  Recent Labs  Lab 08/13/2019 1003 08/30/2019 1003 08/30/19 0442 08/31/19 0509 09/01/19 0215 09/02/19 0224 09/03/19 0225 09/04/19 0201  DDIMER 1.23*   < >  --  >20.00* >20.00* >20.00* 11.78* 11.58*  FERRITIN 484*  --   --   --   --   --   --   --   CRP 13.5*   < > 17.3* 11.0* 7.7* 5.1* 4.4*  --   ALT 40   < > 32 31 47* 65* 53*  --   PROCALCITON <0.10  --   --   --   --   --   --   --    < > = values in this interval not displayed.    Objective findings: Fever: Remains afebrile Oxygen requirements: Heated high flow 50 L/min with nonrebreather.  Saturating in the late 80s.    COVID 19 Therapeutics: Antibacterials: None.  Procalcitonin was less than 0.1 Remdesivir: Completed 5 days course on 8/21 Steroids: Solu-Medrol 50 mg twice a day Diuretics: Lasix being dosed daily depending on clinical condition.  Was given 2 doses yesterday.  Hold today due to elevated creatinine. Actemra/Baricitinib: On baricitinib, started on 8/17 PUD Prophylaxis: Pepcid DVT Prophylaxis: Therapeutic Lovenox    Patient continues to require high amounts of oxygen.  Work of breathing is not that high.  Continue current treatment with steroids and baricitinib.  Patient completed course of Remdesivir.  He is also on therapeutic Lovenox for presumed PE Continue mobilization and incentive spirometry.  Hold off on Lasix today due to elevated creatinine.  CRP had improved.  D-dimer also better.  Acute bilateral lower extremity DVT/presumed pulmonary embolism Patient noted to have significantly elevated D-dimer.  Lower extremity Doppler study showed acute DVT in both legs.  Patient did have mild hemoptysis.  He likely has acute PE as well.  Could not do CT angiogram due to elevated creatinine as well as order tenuous respiratory status.  Continue therapeutic Lovenox.  Echocardiogram shows mildly reduced RV function with increase in size.  Left ventricular function is normal.  Chest pain Based on  history appears to be pleuritic.  None currently.  EKG did not show any ischemic changes.  Troponin is normal.  Pain likely due to pneumonia and PE.  Chronic kidney disease stage IIIa Renal function stable for the most part.  Continue to monitor urine output.    History of COPD and bronchiectasis Patient not on oxygen at home.  Currently not wheezing.  On albuterol MDI four times daily.  Essential hypertension Monitor blood pressures closely.  Not on any antihypertensives at home.  Mild transaminitis Secondary to COVID-19.  Has been stable.  Check periodically.  Normocytic anemia No evidence of overt bleeding.  Likely due to chronic disease.  Hemoglobin stable.    History of prostate cancer Stable.  History of thoracic aortic aneurysm Stable.  DVT Prophylaxis: Lovenox Code Status: Full code Family Communication: Discussed with the patient.  Wife being updated daily. Disposition Plan: Hopefully return home when improved.  Status is: Inpatient  Remains inpatient appropriate because:IV treatments appropriate due to intensity of illness or inability to take PO and Inpatient level of care appropriate due to severity of illness   Dispo:  Patient From: Home  Planned Disposition: To be determined  Expected discharge date: 09/06/19  Medically stable for discharge: No        Medications:  Scheduled: . albuterol  2 puff Inhalation Q6H  . alfuzosin  10 mg Oral Daily  . aspirin EC  81 mg Oral Daily  . baricitinib  4 mg Oral Daily  . brimonidine  1 drop Right Eye BID  . Chlorhexidine Gluconate Cloth  6 each Topical Daily  . dorzolamide  1 drop Right Eye BID  . enoxaparin (LOVENOX) injection  1 mg/kg Subcutaneous Q12H  . famotidine  20 mg Oral Daily  . fenofibrate  160 mg Oral Daily  . latanoprost  1 drop Both Eyes QHS  . mouth rinse  15 mL Mouth Rinse BID  . methylPREDNISolone (SOLU-MEDROL) injection  0.5 mg/kg Intravenous Q12H  . polyethylene glycol  17 g Oral Daily  .  rosuvastatin  10 mg Oral Once per day on Mon Thu  . senna  2 tablet Oral BID   Continuous:  WVP:[id number], guaiFENesin-dextromethorphan, nitroGLYCERIN, phenylephrine, sodium chloride   Objective:  Vital Signs  Vitals:   09/04/19 0900 09/04/19 1000 09/04/19 1100 09/04/19 1132  BP: 138/75 (!) 155/80 133/61   Pulse: 74 91 96   Resp: (!) 22 16 (!) 29   Temp: (!) 97.5 F (36.4 C)   97.9 F (36.6 C)  TempSrc: Axillary   Axillary  SpO2: (!) 84% (!) 88% (!) 89%   Weight:      Height:        Intake/Output Summary (Last 24 hours) at 09/04/2019 1138 Last data filed at 09/04/2019 0800 Gross per 24 hour  Intake --  Output  2200 ml  Net -2200 ml   Filed Weights   09/02/2019 0940 09/03/19 0711 09/04/19 0345  Weight: 99.8 kg 92 kg 92.2 kg    General appearance: Awake alert.  In no distress.  Seems to be slightly fatigued. Resp: Tachypneic.  Coarse breath sounds bilaterally.  No wheezing or rhonchi.  Few crackles at the bases. Cardio: S1-S2 is normal regular.  No S3-S4.  No rubs murmurs or bruit GI: Abdomen is soft.  Nontender nondistended.  Bowel sounds are present normal.  No masses organomegaly Extremities: No edema.  Full range of motion of lower extremities. Neurologic:   No focal neurological deficits.     Lab Results:  Data Reviewed: I have personally reviewed following labs and imaging studies  CBC: Recent Labs  Lab 08/30/19 0442 08/30/19 0442 08/31/19 0509 09/01/19 0215 09/02/19 0224 09/03/19 0225 09/04/19 0201  WBC 6.3   < > 7.6 8.2 7.5 8.5 9.2  NEUTROABS 5.6  --  6.5 6.8 6.5 7.5  --   HGB 10.2*   < > 10.5* 10.8* 11.3* 11.1* 12.2*  HCT 30.6*   < > 31.4* 33.1* 35.3* 33.8* 36.4*  MCV 93.9   < > 91.8 94.0 93.4 93.9 91.9  PLT 265   < > 286 297 322 335 411*   < > = values in this interval not displayed.    Basic Metabolic Panel: Recent Labs  Lab 08/31/19 0509 08/31/19 0509 09/01/19 0215 09/01/19 1611 09/02/19 0224 09/03/19 0225  09/04/19 0201  NA 140  --  141  --  143 137 142  K 4.1   < > 5.6* 3.8 4.3 4.2 4.1  CL 104  --  105  --  103 102 101  CO2 23  --  24  --  23 22 25   GLUCOSE 155*  --  157*  --  149* 148* 159*  BUN 53*  --  50*  --  48* 49* 53*  CREATININE 1.46*  --  1.38*  --  1.21 1.16 1.37*  CALCIUM 8.4*  --  8.7*  --  8.8* 8.4* 8.9   < > = values in this interval not displayed.    GFR: Estimated Creatinine Clearance: 55.4 mL/min (A) (by C-G formula based on SCr of 1.37 mg/dL (H)).  Liver Function Tests: Recent Labs  Lab 08/30/19 0442 08/31/19 0509 09/01/19 0215 09/02/19 0224 09/03/19 0225  AST 48* 50* 65* 65* 41  ALT 32 31 47* 65* 53*  ALKPHOS 57 65 74 82 80  BILITOT 0.4 0.6 0.7 1.0 0.7  PROT 7.1 6.9 7.0 7.1 6.6  ALBUMIN 2.3* 2.5* 2.4* 2.5* 2.4*     Recent Results (from the past 240 hour(s))  SARS Coronavirus 2 by RT PCR (hospital order, performed in [place] hospital lab) Nasopharyngeal Nasopharyngeal Swab     Status: Abnormal   Collection Time: 08/17/2019 10:29 AM   Specimen: Nasopharyngeal Swab  Result Value Ref Range Status   SARS Coronavirus 2 POSITIVE (A) NEGATIVE Final    Comment: RESULT CALLED TO, READ BACK BY AND VERIFIED WITH: GRANT,K. RN @1220  ON 8.17.2021 BY NMCCOY (NOTE) SARS-CoV-2 target nucleic acids are DETECTED  SARS-CoV-2 RNA is generally detectable in upper respiratory specimens  during the acute phase of infection.  Positive results are indicative  of the presence of the identified virus, but do not rule out bacterial infection or co-infection with other pathogens not detected by the test.  Clinical correlation with patient history and  other diagnostic information is necessary  to determine patient infection status.  The expected result is negative.  Fact Sheet for Patients:   StrictlyIdeas.no   Fact Sheet for Healthcare Providers:   BankingDealers.co.za    This test is not yet approved or cleared by the  [place] FDA and  has been authorized for detection and/or diagnosis of SARS-CoV-2 by FDA under an Emergency Use Authorization (EUA).  This EUA will remain in effect (meaning t his test can be used) for the duration of  the COVID-19 declaration under Section 564(b)(1) of the Act, 21 U.S.C. section 360-bbb-3(b)(1), unless the authorization is terminated or revoked sooner.  Performed at [facility], [place] [address] [place], [place] [address]   Blood Culture (routine x 2)     Status: None   Collection Time: 08/18/2019 10:42 AM   Specimen: Left Antecubital; Blood  Result Value Ref Range Status   Specimen Description   Final    LEFT ANTECUBITAL Performed at [facility] [address], [place], [place] [address]    Special Requests   Final    BOTTLES DRAWN AEROBIC AND ANAEROBIC Blood Culture results may not be optimal due to an excessive volume of blood received in culture bottles Performed at [facility] [address], [place], [place] [address]    Culture   Final    NO GROWTH 5 DAYS Performed at [facility], [place] [address], [place], [place] [address]    Report Status 09/03/2019 FINAL  Final  Blood Culture (routine x 2)     Status: None   Collection Time: 08/19/2019 10:52 AM   Specimen: Right Antecubital; Blood  Result Value Ref Range Status   Specimen Description   Final    RIGHT ANTECUBITAL Performed at [facility] [address] [place], [place] [address]    Special Requests   Final    BOTTLES DRAWN AEROBIC AND ANAEROBIC Blood Culture results may not be optimal due to an excessive volume of blood received in culture bottles Performed at [facility] [address] [place], [place] [address]    Culture   Final    NO GROWTH 5 DAYS Performed at [facility], [place] [address], [place], [place] [address]    Report Status 09/03/2019 FINAL  Final  MRSA PCR  Screening     Status: None   Collection Time: 08/31/19 12:00 PM   Specimen: Nasal Mucosa; Nasopharyngeal  Result Value Ref Range Status   MRSA by PCR NEGATIVE NEGATIVE Final    Comment:        The GeneXpert MRSA Assay (FDA approved for NASAL specimens only), is one component of a comprehensive MRSA colonization surveillance program. It is not intended to diagnose MRSA infection nor to guide or monitor treatment for MRSA infections. Performed at [facility], [place] [address] [place], [place] [address]       Radiology Studies: No results found.     LOS: 6 days   Katlynn Naser [employer] on [email or web address]  09/04/2019, 11:38 AM

## 2019-09-04 NOTE — Progress Notes (Signed)
Patient complaining of chest pain. RN administered PRN nitro (see MAR). RN paged triad and awaiting orders.  EKG completed stat. RN will continue to monitor.

## 2019-09-04 NOTE — Progress Notes (Signed)
OT Cancellation Note  Patient Details Name: David Cantrell MRN: [id number] DOB: 1940/11/11   Cancelled Treatment:    Reason Eval/Treat Not Completed: Other (comment) Spoke with RN reports recently got up to [facility] and recliner with RN, requesting to let him recover. Will check back 8/24  Delbert Phenix OT OT pager: [place] 09/04/2019, 1:58 PM

## 2019-09-04 NOTE — Progress Notes (Signed)
Patient had a nose bleed. Lovenox scheduled x2 daily and already administered earlier in shift (see MAR). RN paged [facility]. [facility]ad stated "Do not hold lovenox unless patient is actively bleeding". RN will continue to monitor.

## 2019-09-05 ENCOUNTER — Inpatient Hospital Stay (HOSPITAL_COMMUNITY): Payer: PPO

## 2019-09-05 DIAGNOSIS — J9601 Acute respiratory failure with hypoxia: Secondary | ICD-10-CM

## 2019-09-05 LAB — CBC
HCT: 36.7 % — ABNORMAL LOW (ref 39.0–52.0)
Hemoglobin: 12.1 g/dL — ABNORMAL LOW (ref 13.0–17.0)
MCH: 30.5 pg (ref 26.0–34.0)
MCHC: 33 g/dL (ref 30.0–36.0)
MCV: 92.4 fL (ref 80.0–100.0)
Platelets: 415 10*3/uL — ABNORMAL HIGH (ref 150–400)
RBC: 3.97 MIL/uL — ABNORMAL LOW (ref 4.22–5.81)
RDW: 13.4 % (ref 11.5–15.5)
WBC: 9.9 10*3/uL (ref 4.0–10.5)
nRBC: 0 % (ref 0.0–0.2)

## 2019-09-05 LAB — BASIC METABOLIC PANEL
Anion gap: 12 (ref 5–15)
BUN: 53 mg/dL — ABNORMAL HIGH (ref 8–23)
CO2: 26 mmol/L (ref 22–32)
Calcium: 9 mg/dL (ref 8.9–10.3)
Chloride: 98 mmol/L (ref 98–111)
Creatinine, Ser: 1.14 mg/dL (ref 0.61–1.24)
GFR calc Af Amer: >60 mL/min (ref >60)
GFR calc non Af Amer: >60 mL/min (ref >60)
Glucose, Bld: 177 mg/dL — ABNORMAL HIGH (ref 70–99)
Potassium: 4.3 mmol/L (ref 3.5–5.1)
Sodium: 136 mmol/L (ref 135–145)

## 2019-09-05 LAB — D-DIMER, QUANTITATIVE: D-Dimer, Quant: 3.88 ug/mL-FEU — ABNORMAL HIGH (ref 0.00–0.50)

## 2019-09-05 MED ORDER — FUROSEMIDE 10 MG/ML IJ SOLN
40.0000 mg | Freq: Once | INTRAMUSCULAR | Status: AC
  Administered 2019-09-05: 40 mg via INTRAVENOUS
  Filled 2019-09-05: qty 4

## 2019-09-05 NOTE — Progress Notes (Signed)
Pt complaining of dry nose from [place].  Temperature and humidity setting adjusted to increase the moisture content of the [facility].

## 2019-09-05 NOTE — Consult Note (Signed)
NAME:  David Cantrell, MRN:  [id number], DOB:  Mar 26, 1940, LOS: 7 ADMISSION DATE:  08/21/2019, CONSULTATION DATE:  09/05/19 REFERRING MD:  Dr Curly Rim, CHIEF COMPLAINT:  resp failure with concern for left apical pneumothorax   Brief History   Concern for left apical ptx and pneumomediastinum in setting of covid  History of present illness    79 year old male,, prior smoker, no hx of covid vaccination. Symptomatic since 08/19/19. S/P positive covid 08/25/19 and admitted 08/30/2019 with acute hypoxemic resp failure. COVID Rx by triad MD. On 09/04/19 on HHFNO 50L/min and NRB. Also on [facility] Rx with steroids, oral baricitinib and intermittent lasix and s.p remedesivir. On 09/05/19 routine cxr raised concern for small left apical pneumothorax and left pneumomediastinum and ccm consulted. No overt clinical change    Past Medical History  \ PFT Results Latest Ref Rng & Units 12/11/2014  FVC-Pre L 4.66  FVC-Predicted Pre % 100  FVC-Post L 4.86  FVC-Predicted Post % 104  Pre FEV1/FVC % % 70  Post FEV1/FCV % % 70  FEV1-Pre L 3.25  FEV1-Predicted Pre % 95  FEV1-Post L 3.42  DLCO uncorrected ml/min/mmHg 21.60  DLCO UNC% % 61  DLVA Predicted % 64      has a past medical history of Allergy, Aortic ectasia ([place]), Arthritis, Bronchiectasis ([place]), CAD (coronary artery disease), Cancer ([place]), Cataract, Centrilobular emphysema ([place]), Congenital renal cyst, COPD (chronic obstructive pulmonary disease) ([place]), Diverticulosis of colon, Dyspnea, Emphysema of lung ([place]), First degree heart block, Hemorrhoids, History of adenomatous polyp of colon, History of basal cell carcinoma excision, History of external beam radiation therapy (02-01-2016  to 03-16-2016), History of kidney stones, Hyperlipidemia, Hypertension, Macular degeneration, Multiple pulmonary nodules, Open-angle glaucoma, Pneumonia, Prostate cancer [facility]) (urologist-  dr eskridge/  oncologist-  dr Tammi Klippel), Seasonal allergies, Thoracic aortic aneurysm ([place]),  and Wears dentures.   reports that he quit smoking about 49 years ago. His smoking use included cigarettes. He has a 6.00 pack-year smoking history. He has never used smokeless tobacco.  Past Surgical History:  Procedure Laterality Date  . ANTERIOR CERVICAL DECOMP/DISCECTOMY FUSION N/A 03/15/2019   Procedure: ANTERIOR CERVICAL DECOMPRESSION/DISCECTOMY FUSION CERVICAL FIVE-CERVICAL SIX, CERVICAL SIX- CERVICAL SEVEN;  Surgeon: Melina Schools, MD;  Location: [place];  Service: Orthopedics;  Laterality: N/A;  4 hrs  . CARDIAC CATHETERIZATION  09-04-2002  DR Wynonia Lawman   NORMAL LVF/  MILD TO MODERATE CAD INVOLVING PROXIMAL 40%  AND MID 40%  LAD/  POSTERIOR DESCENDING CX 70%  . CARDIAC CATHETERIZATION  06-05-2010  DR Fidela Juneau   NORMAL LM/  40% PROXIMAL MID LAD/ 70% DISTAL CFX/  80% PROXIMAL PDA/  SMALL & NONDOMINANT RCA  . CARDIOVASCULAR STRESS TEST  06-01-2013   dr Wynonia Lawman   normal lexiscan myoview w/ no ischemia/ normal LV function and wall motion, ef 61%  . COLONOSCOPY    . COLONOSCOPY W/ POLYPECTOMY  last one 05-29-2015  . CYSTOSCOPY N/A 04/07/2016   Procedure: CYSTOSCOPY FLEXIBLE;  Surgeon: Festus Aloe, MD;  Location[facility];  Service: Urology;  Laterality: N/A;  no seeds found in bladder  . CYSTOSCOPY WITH RETROGRADE PYELOGRAM, URETEROSCOPY AND STENT PLACEMENT Right 02/02/2013   Procedure: CYSTOSCOPY WITH RETROGRADE PYELOGRAM, AND Right STENT PLACEMENT;  Surgeon: Molli Hazard, MD;  Location: [facility];  Service: Urology;  Laterality: Right;  . CYSTOSCOPY WITH RETROGRADE PYELOGRAM, URETEROSCOPY AND STENT PLACEMENT Right 02/15/2013   Procedure: CYSTOSCOPY WITH RETROGRADE PYELOGRAM, URETEROSCOPY AND STENT EXCHANGE;  Surgeon: Molli Hazard, MD;  Location: [place];  Service: Urology;  Laterality: Right;  . ESI     ; for cervical radiculopathy  . HEMORRHOID SURGERY  07-03-1999  . HOLMIUM LASER APPLICATION Right 02/15/2681   Procedure: HOLMIUM LASER  APPLICATION;  Surgeon: Molli Hazard, MD;  Location: [facility];  Service: Urology;  Laterality: Right;  . LEFT HEART CATH AND CORONARY ANGIOGRAPHY N/A 06/28/2018   Procedure: LEFT HEART CATH AND CORONARY ANGIOGRAPHY;  Surgeon: Martinique, Peter M, MD;  Location: [place] [facility];  Service: Cardiovascular;  Laterality: N/A;  . POLYPECTOMY    . PROSTATE BIOPSY    . RADIOACTIVE SEED IMPLANT N/A 04/07/2016   Procedure: RADIOACTIVE SEED IMPLANT/BRACHYTHERAPY IMPLANT;  Surgeon: Festus Aloe, MD;  Location: [facility];  Service: Urology;  Laterality: N/A;  55 seeds implanted  . SHOULDER OPEN ROTATOR CUFF REPAIR Bilateral RIGHT X2  LAST ONE 1997/  LEFT 2005   right inferior glenoid screw retained  . VIDEO BRONCHOSCOPY Bilateral 09/24/2014   Procedure: VIDEO BRONCHOSCOPY WITH FLUORO;  Surgeon: Juanito Doom, MD;  Location: [place];  Service: Cardiopulmonary;  Laterality: Bilateral;    Allergies  Allergen Reactions  . Aspirin   . Doxycycline Other (See Comments)    Dizziness   . Fish Oil   . Lipitor [Atorvastatin] Other (See Comments)    MUSCLE ACHES  . Morphine And Related Other (See Comments)    Severe headache  . Niacin And Related Other (See Comments)    FLUSHING  . Ramipril Cough  . Sulfa Antibiotics Hives  . Codeine Other (See Comments)    CONSTIPATION    Immunization History  Administered Date(s) Administered  . Fluad Quad(high Dose 65+) 10/06/2018  . Influenza Whole 12/03/2006, 02/21/2009  . Influenza, High Dose Seasonal PF 10/18/2012, 10/03/2013, 09/18/2014, 10/04/2015, 10/13/2016, 11/03/2017  . Influenza, Seasonal, Injecte, Preservative Fre 01/21/2012  . Pneumococcal Conjugate-13 10/17/2013  . Pneumococcal Polysaccharide-23 03/02/2007  . Td 12/13/2007, 04/01/2011  . Zoster 11/03/2012    Family History  Problem Relation Age of Onset  . Stroke Father        in 54s  . Colon cancer Father        upper 35's  . Cancer Father         colon  . Heart attack Brother 61       stent  . Cancer Brother        pre cancerous bladder lesion  . Diabetes Paternal Uncle   . Cancer Cousin        prostate  . Cancer Other        bladder  . Colon polyps Sister   . Colon polyps Brother   . Colon polyps Brother   . Arthritis Neg Hx   . Asthma Neg Hx   . COPD Neg Hx   . Rectal cancer Neg Hx   . Stomach cancer Neg Hx   . Esophageal cancer Neg Hx      Current Facility-Administered Medications:  .  albuterol (VENTOLIN HFA) 108 (90 Base) MCG/ACT inhaler 2 puff, 2 puff, Inhalation, Q6H, Charlynne Cousins, MD, 2 puff at 09/05/19 0846 .  alfuzosin (UROXATRAL) 24 hr tablet 10 mg, 10 mg, Oral, Daily, Charlynne Cousins, MD, 10 mg at 09/05/19 0858 .  aspirin EC tablet 81 mg, 81 mg, Oral, Daily, Charlynne Cousins, MD, 81 mg at 09/05/19 0859 .  baricitinib (OLUMIANT) tablet 4 mg, 4 mg, Oral, Daily, Charlynne Cousins, MD, 4 mg at  09/05/19 0857 .  brimonidine (ALPHAGAN) 0.15 % ophthalmic solution 1 drop, 1 drop, Right Eye, BID, Charlynne Cousins, MD, 1 drop at 09/05/19 0854 .  Chlorhexidine Gluconate Cloth 2 % PADS 6 each, 6 each, Topical, Daily, Bonnielee Haff, MD, 6 each at 09/04/19 1200 .  chlorpheniramine-HYDROcodone (TUSSIONEX) 10-8 MG/5ML suspension 5 mL, 5 mL, Oral, Q12H PRN, Charlynne Cousins, MD, 5 mL at 09/04/19 0341 .  dorzolamide (TRUSOPT) 2 % ophthalmic solution 1 drop, 1 drop, Right Eye, BID, Charlynne Cousins, MD, 1 drop at 09/05/19 0855 .  enoxaparin (LOVENOX) injection 100 mg, 1 mg/kg, Subcutaneous, Q12H, Shade, Christine E, RPH, 100 mg at 09/05/19 0849 .  famotidine (PEPCID) tablet 20 mg, 20 mg, Oral, Daily, Bonnielee Haff, MD, 20 mg at 09/05/19 0859 .  feeding supplement (ENSURE ENLIVE) (ENSURE ENLIVE) liquid 237 mL, 237 mL, Oral, TID BM, Bonnielee Haff, MD, 237 mL at 09/05/19 0906 .  fenofibrate tablet 160 mg, 160 mg, Oral, Daily, Charlynne Cousins, MD, 160 mg at 09/05/19 0858 .   guaiFENesin-dextromethorphan (ROBITUSSIN DM) 100-10 MG/5ML syrup 10 mL, 10 mL, Oral, Q4H PRN, Charlynne Cousins, MD .  latanoprost (XALATAN) 0.005 % ophthalmic solution 1 drop, 1 drop, Both Eyes, QHS, Charlynne Cousins, MD, 1 drop at 09/04/19 2140 .  MEDLINE mouth rinse, 15 mL, Mouth Rinse, BID, Bonnielee Haff, MD, 15 mL at 09/05/19 0856 .  methylPREDNISolone sodium succinate (SOLU-MEDROL) 125 mg/2 mL injection 50 mg, 0.5 mg/kg, Intravenous, Q12H, Green, Terri L, RPH, 50 mg at 09/05/19 0416 .  nitroGLYCERIN (NITROSTAT) SL tablet 0.4 mg, 0.4 mg, Sublingual, Q5 min PRN, Charlynne Cousins, MD, 0.4 mg at 09/04/19 0354 .  phenylephrine (NEO-SYNEPHRINE) 0.25 % nasal spray 1 spray, 1 spray, Each Nare, Q6H PRN, Lang Snow, NP, 1 spray at 09/04/19 0556 .  polyethylene glycol (MIRALAX / GLYCOLAX) packet 17 g, 17 g, Oral, Daily, Bonnielee Haff, MD, 17 g at 09/04/19 0936 .  rosuvastatin (CRESTOR) tablet 10 mg, 10 mg, Oral, Once per day on Mon Thu, Charlynne Cousins, MD, 10 mg at 09/04/19 0941 .  senna (SENOKOT) tablet 17.2 mg, 2 tablet, Oral, BID, Bonnielee Haff, MD, 17.2 mg at 09/05/19 0900 .  sodium chloride (OCEAN) 0.65 % nasal spray 1 spray, 1 spray, Each Nare, PRN, Bonnielee Haff, MD, 1 spray at 09/01/19 [facility] Events   09/12/2019 - admit 8/24 - ccm consult  Consults:  8/24 - ccm consult  Procedures:  x  Significant Diagnostic Tests:  x  Micro Data:   Recent Results (from the past 240 hour(s))  SARS Coronavirus 2 by RT PCR (hospital order, performed in [place] hospital lab) Nasopharyngeal Nasopharyngeal Swab     Status: Abnormal   Collection Time: 08/30/2019 10:29 AM   Specimen: Nasopharyngeal Swab  Result Value Ref Range Status   SARS Coronavirus 2 POSITIVE (A) NEGATIVE Final    Comment: RESULT CALLED TO, READ BACK BY AND VERIFIED WITH: GRANT,K. RN @1220  ON 8.17.2021 BY NMCCOY (NOTE) SARS-CoV-2 target nucleic acids are  DETECTED  SARS-CoV-2 RNA is generally detectable in upper respiratory specimens  during the acute phase of infection.  Positive results are indicative  of the presence of the identified virus, but do not rule out bacterial infection or co-infection with other pathogens not detected by the test.  Clinical correlation with patient history and  other diagnostic information is necessary to determine patient infection status.  The expected result is negative.  Fact Sheet for Patients:  StrictlyIdeas.no   Fact Sheet for Healthcare Providers:   BankingDealers.co.za    This test is not yet approved or cleared by the [place] FDA and  has been authorized for detection and/or diagnosis of SARS-CoV-2 by FDA under an Emergency Use Authorization (EUA).  This EUA will remain in effect (meaning t his test can be used) for the duration of  the COVID-19 declaration under Section 564(b)(1) of the Act, 21 U.S.C. section 360-bbb-3(b)(1), unless the authorization is terminated or revoked sooner.  Performed at [facility], [place] [address], [place], [place] [address]   Blood Culture (routine x 2)     Status: None   Collection Time: 08/28/2019 10:42 AM   Specimen: Left Antecubital; Blood  Result Value Ref Range Status   Specimen Description   Final    LEFT ANTECUBITAL Performed at [place] [address], [place], [place] [address]    Special Requests   Final    BOTTLES DRAWN AEROBIC AND ANAEROBIC Blood Culture results may not be optimal due to an excessive volume of blood received in culture bottles Performed at [place] [address], [place], [place] [address]    Culture   Final    NO GROWTH 5 DAYS Performed at [facility], [place] [address], [place], [place] [address]    Report Status 09/03/2019 FINAL  Final  Blood Culture (routine x 2)     Status: None   Collection Time:  09/05/2019 10:52 AM   Specimen: Right Antecubital; Blood  Result Value Ref Range Status   Specimen Description   Final    RIGHT ANTECUBITAL Performed at [place] [address] [place], [place] [address]    Special Requests   Final    BOTTLES DRAWN AEROBIC AND ANAEROBIC Blood Culture results may not be optimal due to an excessive volume of blood received in culture bottles Performed at [place] [address] [place], [place] [address]    Culture   Final    NO GROWTH 5 DAYS Performed at [facility], [place] [address] [place], [place] [address]    Report Status 09/03/2019 FINAL  Final  MRSA PCR Screening     Status: None   Collection Time: 08/31/19 12:00 PM   Specimen: Nasal Mucosa; Nasopharyngeal  Result Value Ref Range Status   MRSA by PCR NEGATIVE NEGATIVE Final    Comment:        The GeneXpert MRSA Assay (FDA approved for NASAL specimens only), is one component of a comprehensive MRSA colonization surveillance program. It is not intended to diagnose MRSA infection nor to guide or monitor treatment for MRSA infections. Performed at [facility], [place] [address] [place],  [address]      Antimicrobials:  * Anti-infectives (From admission, onward)   Start     Dose/Rate Route Frequency Ordered Stop   08/30/19 1000  remdesivir 100 mg in sodium chloride 0.9 % 100 mL IVPB       "Followed by" Linked Group Details   100 mg 200 mL/hr over 30 Minutes Intravenous Daily 09/05/2019 1247 09/02/19 0926   08/30/19 1000  remdesivir 100 mg in sodium chloride 0.9 % 100 mL IVPB  Status:  Discontinued       "Followed by" Linked Group Details   100 mg 200 mL/hr over 30 Minutes Intravenous Daily 08/28/2019 1518 08/24/2019 1621   08/18/2019 1600  remdesivir 200 mg in sodium  chloride 0.9% 250 mL IVPB  Status:  Discontinued       "Followed by" Linked Group Details   200 mg 580 mL/hr over 30 Minutes Intravenous Once  09/10/2019 1518 09/03/2019 1621   09/03/2019 1400  remdesivir 200 mg in sodium chloride 0.9% 250 mL IVPB       "Followed by" Linked Group Details   200 mg 580 mL/hr over 30 Minutes Intravenous Once 08/22/2019 1247 08/24/2019 1731       Interim history/subjective:  8/24 -s een in bed 1228  Objective   Blood pressure (!) 148/83, pulse 86, temperature 97.7 F (36.5 C), temperature source Axillary, resp. rate 19, height 6\' 4"  (1.93 m), weight 89 kg, SpO2 90 %.    FiO2 (%):  [100 %] 100 %   Intake/Output Summary (Last 24 hours) at 09/05/2019 1036 Last data filed at 09/05/2019 0418 Gross per 24 hour  Intake --  Output 850 ml  Net -850 ml   Filed Weights   09/03/19 0711 09/04/19 0345 09/05/19 0424  Weight: 92 kg 92.2 kg 89 kg    Examination: General: well built male. Some deconditioned. Lying in bed. No overt distress HENT: HHFNC and face mask o2+ Lungs: no distress. Overt crackles. Mild tachypnea. Not paradoxical Cardiovascular: Tachy. Normal heart sounds Abdomen: soft. Non tender Extremities: intact Neuro: alert and oriented x 3 GU: not examin  Bedside Thoracic Ultrasound  Rt side - sliding lung sign + Left side - sliding lung sign + but less prominent  CXR personal visualization   - not fully convinced  Resolved Hospital Problem list   x  Assessment & Plan:  Acute Hypoxemic Resp Failure to FYBOF-75 Course complicated by concern for left apical pneumothorax and pneumomediastinum   - personal visualization: too small or non-existent for intervention with chest tube  Plan  - hold off bipap today - normally should be a bipap caondidate based on new data on recovery trial - serial cxr - if worsens ptx then place chest tube - patient informed - he is agreeable     Best practice:  Per triad  D/w Dr Curly Rim     ATTESTATION & SIGNATURE   The patient Dhanvin Szeto Stoffer is critically ill with multiple organ systems failure and requires high complexity decision making for  assessment and support, frequent evaluation and titration of therapies, application of advanced monitoring technologies and extensive interpretation of multiple databases.   Critical Care Time devoted to patient care services described in this note is  45  Minutes. This time reflects time of care of this signee Dr Brand Males. This critical care time does not reflect procedure time, or teaching time or supervisory time of PA/NP/Med student/Med Resident etc but could involve care discussion time     Dr. Brand Males, M.D., [facility] Pulmonary and Critical Care Medicine Staff Physician [place] [facility] Pager: [phone], If no answer or between  15:00h - 7:00h: call [phone]  09/05/2019 10:36 AM    LABS    PULMONARY No results for input(s): PHART, PCO2ART, PO2ART, HCO3, TCO2, O2SAT in the last 168 hours.  Invalid input(s): PCO2, PO2  CBC Recent Labs  Lab 09/03/19 0225 09/04/19 0201 09/05/19 0242  HGB 11.1* 12.2* 12.1*  HCT 33.8* 36.4* 36.7*  WBC 8.5 9.2 9.9  PLT 335 411* 415*    COAGULATION Recent Labs  Lab 09/04/19 0759  INR 1.5*    CARDIAC  No results for  input(s): TROPONINI in the last 168 hours. No results for input(s): PROBNP in the last 168 hours.   CHEMISTRY Recent Labs  Lab 09/01/19 0215 09/01/19 1611 09/02/19 0224 09/02/19 0224 09/03/19 0225 09/03/19 0225 09/04/19 0201 09/05/19 0242  NA 141  --  143  --  137  --  142 136  K 5.6*   < > 4.3   < > 4.2   < > 4.1 4.3  CL 105  --  103  --  102  --  101 98  CO2 24  --  23  --  22  --  25 26  GLUCOSE 157*  --  149*  --  148*  --  159* 177*  BUN 50*  --  48*  --  49*  --  53* 53*  CREATININE 1.38*  --  1.21  --  1.16  --  1.37* 1.14  CALCIUM 8.7*  --  8.8*  --  8.4*  --  8.9 9.0   < > = values in this interval not displayed.   Estimated Creatinine Clearance: 66.6 mL/min (by C-G formula based on SCr of 1.14 mg/dL).   LIVER Recent Labs  Lab  08/30/19 0442 08/31/19 0509 09/01/19 0215 09/02/19 0224 09/03/19 0225 09/04/19 0759  AST 48* 50* 65* 65* 41  --   ALT 32 31 47* 65* 53*  --   ALKPHOS 57 65 74 82 80  --   BILITOT 0.4 0.6 0.7 1.0 0.7  --   PROT 7.1 6.9 7.0 7.1 6.6  --   ALBUMIN 2.3* 2.5* 2.4* 2.5* 2.4*  --   INR  --   --   --   --   --  1.5*     INFECTIOUS Recent Labs  Lab 09/07/2019 1348  LATICACIDVEN 1.9     ENDOCRINE CBG (last 3)  No results for input(s): GLUCAP in the last 72 hours.       IMAGING x48h  - image(s) personally visualized  -   highlighted in bold DG Chest Port 1 View  Result Date: 09/05/2019 CLINICAL DATA:  Pneumonia, COVID-19 EXAM: PORTABLE CHEST 1 VIEW COMPARISON:  Portable exam 4982 hours compared to 08/30/2019 FINDINGS: Stable heart size and pulmonary vascularity. Pneumomediastinum present, new since previous exam, extending into chest wall and supraclavicular regions. Small LEFT apex pneumothorax. Patchy infiltrates in mid to lower lungs consistent with multifocal pneumonia and COVID-19. No pleural effusion identified. Atherosclerotic calcification aorta. IMPRESSION: Persistent pulmonary infiltrates consistent with multifocal pneumonia. New pneumomediastinum and small LEFT apex pneumothorax. Critical Value/emergent results were called by telephone at the time of interpretation on 09/05/2019 at 0721 hrs to provider Bonnielee Haff MD, who verbally acknowledged these results. Electronically Signed   By: Lavonia Dana M.D.   On: 09/05/2019 07:25

## 2019-09-05 NOTE — Progress Notes (Signed)
    Repeat cxr now reporting bilateral apical ptx but RN reports improved hypoxemia  Plan  - 9pm cxr     SIGNATURE    Dr. Brand Males, M.D., F.C.C.P,  Pulmonary and Critical Care Medicine Staff Physician, [place] Director - Interstitial Lung Disease  Program  Pulmonary [place] [facility] [place], [place], [address]  Pager: [phone], If no answer  OR between  19:00-7:00h: page [phone] Telephone (clinical office): [phone] Telephone (research): [phone]  4:21 PM 09/05/2019      DG CHEST PORT 1 VIEW  Result Date: 09/05/2019 CLINICAL DATA:  COVID-19 infection. Tachypnea, tachycardia, and hypoxia. EXAM: PORTABLE CHEST 1 VIEW COMPARISON:  09/05/2019 at 4:54 a.m. FINDINGS: The cardiomediastinal silhouette is unchanged. Pneumomediastinum is again noted with subcutaneous emphysema also present in the chest wall and neck bilaterally. A small left apical pneumothorax is unchanged, while there is a new small right apical pneumothorax. Patchy bilateral lung opacities, greatest in the lower lungs, are unchanged. No sizable pleural effusion is identified. IMPRESSION: 1. Small bilateral apical pneumothoraces, new on the right and unchanged on the left. 2. Unchanged bilateral lung opacities consistent with pneumonia. Electronically Signed   By: Logan Bores M.D.   On: 09/05/2019 15:07   DG Chest Port 1 View  Result Date: 09/05/2019 CLINICAL DATA:  Pneumonia, COVID-19 EXAM: PORTABLE CHEST 1 VIEW COMPARISON:  Portable exam 0454 hours compared to 08/30/2019 FINDINGS: Stable heart size and pulmonary vascularity. Pneumomediastinum present, new since previous exam, extending into chest wall and supraclavicular regions. Small LEFT apex pneumothorax. Patchy infiltrates in mid to lower lungs consistent with multifocal pneumonia and COVID-19. No pleural effusion identified. Atherosclerotic calcification aorta. IMPRESSION:  Persistent pulmonary infiltrates consistent with multifocal pneumonia. New pneumomediastinum and small LEFT apex pneumothorax. Critical Value/emergent results were called by telephone at the time of interpretation on 09/05/2019 at 0721 hrs to provider Bonnielee Haff MD, who verbally acknowledged these results. Electronically Signed   By: Lavonia Dana M.D.   On: 09/05/2019 07:25

## 2019-09-05 NOTE — Progress Notes (Signed)
OT Cancellation Note  Patient Details Name: David Cantrell MRN: [id number] DOB: September 05, 1940   Cancelled Treatment:    Reason Eval/Treat Not Completed: Other (comment) RN ask to hold on OOB mobility until chest XR results are in. Will follow up 8/25.   Delbert Phenix OT OT pager: [phone]   Rosemary Holms 09/05/2019, 2:57 PM

## 2019-09-05 NOTE — Progress Notes (Signed)
PT Cancellation Note  Patient Details Name: David Cantrell MRN: [id number] DOB: 12/16/1940   Cancelled Treatment:    Reason Eval/Treat Not Completed: Medical issues which prohibited therapy. Attempted PT tx session this am-RN reported pt fatigued this a.m and she requested PT hold session until after chest x-ray this afternoon. Still awaiting results of chest xray at this time. Will check back as schedule allows-possibly tomorrow. Thanks.    [facility]  Office: [phone] Pager: [phone]

## 2019-09-05 NOTE — Progress Notes (Signed)
ANTICOAGULATION CONSULT NOTE - Follow Up Consult  Pharmacy Consult for Lovenox Indication: Suspected VTE  Allergies  Allergen Reactions  . Aspirin   . Doxycycline Other (See Comments)    Dizziness   . Fish Oil   . Lipitor [Atorvastatin] Other (See Comments)    MUSCLE ACHES  . Morphine And Related Other (See Comments)    Severe headache  . Niacin And Related Other (See Comments)    FLUSHING  . Ramipril Cough  . Sulfa Antibiotics Hives  . Codeine Other (See Comments)    CONSTIPATION    Patient Measurements: Height: 6\' 4"  (193 cm) Weight: 89 kg (196 lb 3.4 oz) IBW/kg (Calculated) : 86.8  Vital Signs: Temp: 97.7 F (36.5 C) (08/24 0800) Temp Source: Axillary (08/24 0800) BP: 148/83 (08/24 0900) Pulse Rate: 86 (08/24 0900)  Labs: Recent Labs    09/03/19 0225 09/03/19 0225 09/04/19 0201 09/04/19 0759 09/05/19 0242  HGB 11.1*   < > 12.2*  --  12.1*  HCT 33.8*  --  36.4*  --  36.7*  PLT 335  --  411*  --  415*  LABPROT  --   --   --  17.3*  --   INR  --   --   --  1.5*  --   CREATININE 1.16  --  1.37*  --  1.14  TROPONINIHS  --   --   --  11  --    < > = values in this interval not displayed.    Estimated Creatinine Clearance: 66.6 mL/min (by C-G formula based on SCr of 1.14 mg/dL).   Assessment: 48 yoM admitted with COVID-19 pneumonia.  Initially started on Lovenox for VTE prophylaxis.  High suspicion for VTE today and pharmacy is consulted to dose Lovenox.  Dopplers +DVTs bilaterally.   SCr improved 1.14, near baseline with CrCl ~ 66 ml/min  CBC: Hgb 12.1 is improved, Plt wnl  D-dimer increased (1.23 on 8/17, >20 on 8/19, 3.88 today 8/24)  Goal of Therapy:  Anti-Xa level 0.6-1 units/ml 4hrs after LMWH dose given Monitor platelets by anticoagulation protocol: Yes   Plan:  Continue Lovenox 1 mg/kg (100mg ) [place] q12h Follow up renal function, CBC, s/s bleeding.  Peggyann Juba, PharmD, BCPS [facility] main pharmacy [phone] 09/05/2019 10:56 AM

## 2019-09-05 NOTE — Progress Notes (Signed)
Assisted tele visit to patient with wife.  Adalea Handler R, RN  

## 2019-09-05 NOTE — Progress Notes (Signed)
PROGRESS NOTE  David Cantrell BPZ:[id number] DOB: December 17, 1940 DOA: 08/27/2019  PCP: [name], MD  Brief History/Interval Summary: 79 y.o. male past medical history of COPD bronchiectasis essential hypertension chronic kidney disease stage IIIa who started having body aches on 08/19/2019.  Apparently tested positive for COVID-19 on August 13.  He was apparently set up for monoclonal antibody infusion but was noted to be tachypneic tachycardic and hypoxic and so he was transferred to the emergency department.  Hospitalized for further management.    Reason for Visit: Acute respiratory failure with hypoxia.  Pneumonia due to COVID-19.  Consultants: Pulmonology  Procedures: None  Antibiotics: Anti-infectives (From admission, onward)   Start     Dose/Rate Route Frequency Ordered Stop   08/30/19 1000  remdesivir 100 mg in sodium chloride 0.9 % 100 mL IVPB       "Followed by" Linked Group Details   100 mg 200 mL/hr over 30 Minutes Intravenous Daily 08/24/2019 1247 09/02/19 0926   08/30/19 1000  remdesivir 100 mg in sodium chloride 0.9 % 100 mL IVPB  Status:  Discontinued       "Followed by" Linked Group Details   100 mg 200 mL/hr over 30 Minutes Intravenous Daily 08/28/2019 1518 08/17/2019 1621   09/06/2019 1600  remdesivir 200 mg in sodium chloride 0.9% 250 mL IVPB  Status:  Discontinued       "Followed by" Linked Group Details   200 mg 580 mL/hr over 30 Minutes Intravenous Once 08/24/2019 1518 08/28/2019 1621   08/19/2019 1400  remdesivir 200 mg in sodium chloride 0.9% 250 mL IVPB       "Followed by" Linked Group Details   200 mg 580 mL/hr over 30 Minutes Intravenous Once 08/17/2019 1247 08/28/2019 1731      Subjective/Interval History: Patient denies any new complaints.  Continues to have shortness of breath and feels fatigued.  No chest pain this morning.       Assessment/Plan:  Acute Hypoxic Resp. Failure/Pneumonia due to COVID-19  Recent Labs  Lab 08/30/19 0442 08/31/19 0509  08/31/19 0509 09/01/19 0215 09/02/19 0224 09/03/19 0225 09/04/19 0201 09/05/19 0242  DDIMER  --  >20.00*   < > >20.00* >20.00* 11.78* 11.58* 3.88*  CRP 17.3* 11.0*  --  7.7* 5.1* 4.4*  --   --   ALT 32 31  --  47* 65* 53*  --   --    < > = values in this interval not displayed.    Objective findings: Fever: Remains afebrile Oxygen requirements: Heated high flow 50 L/min with nonrebreather.  Saturating in the late 80s.    COVID 19 Therapeutics: Antibacterials: None.  Procalcitonin was less than 0.1 Remdesivir: Completed 5 days course on 8/21 Steroids: Solu-Medrol 50 mg twice a day Diuretics: Lasix being dosed daily depending on clinical condition.  Last given on 8/22.  Will repeat today.  Actemra/Baricitinib: On baricitinib, started on 8/17 PUD Prophylaxis: Pepcid DVT Prophylaxis: Therapeutic Lovenox    Patient continues to require high amounts of oxygen.  His x-ray from this morning shows evidence for pneumomediastinum and possibly small left apical pneumothorax.  Discussed with pulmonology who will consult.  Ideally would like to do a CT scan but due to his high oxygen requirements we will be unable to do this safely.  Patient is stable for the most part.  He has completed course of remdesivir.  Remains on steroids and baricitinib.  Therapeutic Lovenox for presumed PE  Continue mobilization.  Another dose of Lasix  today.  He has a negative fluid balance.  Continue to monitor daily weights.  D-dimer has improved to 3.88.  Acute bilateral lower extremity DVT/presumed pulmonary embolism Patient noted to have significantly elevated D-dimer.  Lower extremity Doppler study showed acute DVT in both legs.  Patient did have mild hemoptysis.  He likely has acute PE as well.  Could not do CT angiogram due to elevated creatinine as well as order tenuous respiratory status.  Continue therapeutic Lovenox.  Echocardiogram shows mildly reduced RV function with increase in size.  Left ventricular  function is normal. Patient is stable.  No signs of bleeding.  Chest pain Based on history appears to be pleuritic.  None currently.  EKG did not show any ischemic changes.  Troponin is normal.  Pain likely due to pneumonia and PE.  Chronic kidney disease stage IIIa Renal function stable for the most part.  Continue to monitor urine output.  Monitor creatinine while he is getting diuresed.  History of COPD and bronchiectasis Patient not on oxygen at home.  Currently not wheezing.  On albuterol MDI four times daily.  Essential hypertension Blood pressure is reasonably well controlled.  Continue to monitor.  Not on any antihypertensives at home.  Mild transaminitis Secondary to COVID-19.  LFTs had improved.  Normocytic anemia No evidence of overt bleeding.  Likely due to chronic disease.  Hemoglobin stable.    History of prostate cancer Stable.  History of thoracic aortic aneurysm Stable.  DVT Prophylaxis: Lovenox Code Status: Full code Family Communication: Discussed with the patient.  Wife being updated daily. Disposition Plan: Hopefully return home when improved.  Status is: Inpatient  Remains inpatient appropriate because:IV treatments appropriate due to intensity of illness or inability to take PO and Inpatient level of care appropriate due to severity of illness   Dispo:  Patient From: Home  Planned Disposition: To be determined  Expected discharge date: 09/06/19  Medically stable for discharge: No        Medications:  Scheduled: . albuterol  2 puff Inhalation Q6H  . alfuzosin  10 mg Oral Daily  . aspirin EC  81 mg Oral Daily  . baricitinib  4 mg Oral Daily  . brimonidine  1 drop Right Eye BID  . Chlorhexidine Gluconate Cloth  6 each Topical Daily  . dorzolamide  1 drop Right Eye BID  . enoxaparin (LOVENOX) injection  1 mg/kg Subcutaneous Q12H  . famotidine  20 mg Oral Daily  . feeding supplement (ENSURE ENLIVE)  237 mL Oral TID BM  . fenofibrate  160  mg Oral Daily  . latanoprost  1 drop Both Eyes QHS  . mouth rinse  15 mL Mouth Rinse BID  . methylPREDNISolone (SOLU-MEDROL) injection  0.5 mg/kg Intravenous Q12H  . polyethylene glycol  17 g Oral Daily  . rosuvastatin  10 mg Oral Once per day on Mon Thu  . senna  2 tablet Oral BID   Continuous:  KXF:[id number]-[id number], guaiFENesin-dextromethorphan, nitroGLYCERIN, phenylephrine, sodium chloride   Objective:  Vital Signs  Vitals:   09/05/19 0803 09/05/19 0900 09/05/19 1000 09/05/19 1100  BP:  (!) 148/83 (!) 149/82 (!) 146/71  Pulse:  86 94 (!) 108  Resp:  19 (!) 25 18  Temp:      TempSrc:      SpO2: (!) 86% 90% 91% (!) 79%  Weight:      Height:        Intake/Output Summary (Last 24 hours) at 09/05/2019 1253 Last data filed at  09/05/2019 1100 Gross per 24 hour  Intake 500 ml  Output 1150 ml  Net -650 ml   Filed Weights   09/03/19 0711 09/04/19 0345 09/05/19 0424  Weight: 92 kg 92.2 kg 89 kg    General appearance: Awake alert.  In no distress Resp: Tachypneic with coarse breath sounds bilaterally.  Few crackles at the bases.  No wheezing or rhonchi. Cardio: S1-S2 is normal regular.  No S3-S4.  No rubs murmurs or bruit GI: Abdomen is soft.  Nontender nondistended.  Bowel sounds are present normal.  No masses organomegaly Extremities: No edema.  Full range of motion of lower extremities. Neurologic No focal neurological deficits.      Lab Results:  Data Reviewed: I have personally reviewed following labs and imaging studies  CBC: Recent Labs  Lab 08/30/19 0442 08/30/19 0442 08/31/19 0509 08/31/19 0509 09/01/19 0215 09/02/19 0224 09/03/19 0225 09/04/19 0201 09/05/19 0242  WBC 6.3   < > 7.6   < > 8.2 7.5 8.5 9.2 9.9  NEUTROABS 5.6  --  6.5  --  6.8 6.5 7.5  --   --   HGB 10.2*   < > 10.5*   < > 10.8* 11.3* 11.1* 12.2* 12.1*  HCT 30.6*   < > 31.4*   < > 33.1* 35.3* 33.8* 36.4* 36.7*  MCV 93.9   < > 91.8   < > 94.0 93.4 93.9 91.9 92.4  PLT 265    < > 286   < > 297 322 335 411* 415*   < > = values in this interval not displayed.    Basic Metabolic Panel: Recent Labs  Lab 09/01/19 0215 09/01/19 0215 09/01/19 1611 09/02/19 0224 09/03/19 0225 09/04/19 0201 09/05/19 0242  NA 141  --   --  143 137 142 136  K 5.6*   < > 3.8 4.3 4.2 4.1 4.3  CL 105  --   --  103 102 101 98  CO2 24  --   --  23 22 25 26   GLUCOSE 157*  --   --  149* 148* 159* 177*  BUN 50*  --   --  48* 49* 53* 53*  CREATININE 1.38*  --   --  1.21 1.16 1.37* 1.14  CALCIUM 8.7*  --   --  8.8* 8.4* 8.9 9.0   < > = values in this interval not displayed.    GFR: Estimated Creatinine Clearance: 66.6 mL/min (by C-G formula based on SCr of 1.14 mg/dL).  Liver Function Tests: Recent Labs  Lab 08/30/19 0442 08/31/19 0509 09/01/19 0215 09/02/19 0224 09/03/19 0225  AST 48* 50* 65* 65* 41  ALT 32 31 47* 65* 53*  ALKPHOS 57 65 74 82 80  BILITOT 0.4 0.6 0.7 1.0 0.7  PROT 7.1 6.9 7.0 7.1 6.6  ALBUMIN 2.3* 2.5* 2.4* 2.5* 2.4*     Recent Results (from the past 240 hour(s))  SARS Coronavirus 2 by RT PCR (hospital order, performed in [place] hospital lab) Nasopharyngeal Nasopharyngeal Swab     Status: Abnormal   Collection Time: 09/04/2019 10:29 AM   Specimen: Nasopharyngeal Swab  Result Value [place] Status   SARS Coronavirus 2 POSITIVE (A) NEGATIVE Final    Comment: RESULT CALLED TO, READ BACK BY AND VERIFIED WITH: GRANT,K. RN @1220  ON 8.17.2021 BY NMCCOY (NOTE) SARS-CoV-2 target nucleic acids are DETECTED  SARS-CoV-2 RNA is generally detectable in upper respiratory specimens  during the acute phase of infection.  Positive results are indicative  of the presence of the identified virus, but do not rule out bacterial infection or co-infection with other pathogens not detected by the test.  Clinical correlation with patient history and  other diagnostic information is necessary to determine patient infection status.  The expected result is  negative.  Fact Sheet for Patients:   StrictlyIdeas.no   Fact Sheet for Healthcare Providers:   BankingDealers.co.za    This test is not yet approved or cleared by the [place] FDA and  has been authorized for detection and/or diagnosis of SARS-CoV-2 by FDA under an Emergency Use Authorization (EUA).  This EUA will remain in effect (meaning t his test can be used) for the duration of  the COVID-19 declaration under Section 564(b)(1) of the Act, 21 U.S.C. section 360-bbb-3(b)(1), unless the authorization is terminated or revoked sooner.  Performed at [facility], [place] [address], [place], [place] [address]   Blood Culture (routine x 2)     Status: None   Collection Time: 08/16/2019 10:42 AM   Specimen: Left Antecubital; Blood  Result Value Ref [place] Status   Specimen Description   Final    LEFT ANTECUBITAL Performed at [place] [address], [place], [place] [address]    Special Requests   Final    BOTTLES DRAWN AEROBIC AND ANAEROBIC Blood Culture results may not be optimal due to an excessive volume of blood received in culture bottles Performed at [place] [address] [place], [place] [address]    Culture   Final    NO GROWTH 5 DAYS Performed at [facility], [place] [address], [place], [place] [address]    Report Status 09/03/2019 FINAL  Final  Blood Culture (routine x 2)     Status: None   Collection Time: 08/13/2019 10:52 AM   Specimen: Right Antecubital; Blood  Result Value Ref [place] Status   Specimen Description   Final    RIGHT ANTECUBITAL Performed at [place] [address], [place], [place] [address]    Special Requests   Final    BOTTLES DRAWN AEROBIC AND ANAEROBIC Blood Culture results may not be optimal due to an excessive volume of blood received in culture bottles Performed at [place] [address], [place], [place] [address]    Culture   Final    NO GROWTH 5 DAYS Performed at [facility], [place] [address], [place], [place] [address]    Report Status 09/03/2019 FINAL  Final  MRSA PCR Screening     Status: None   Collection Time: 08/31/19 12:00 PM   Specimen: Nasal Mucosa; Nasopharyngeal  Result Value Ref [place] Status   MRSA by PCR NEGATIVE NEGATIVE Final    Comment:        The GeneXpert MRSA Assay (FDA approved for NASAL specimens only), is one component of a comprehensive MRSA colonization surveillance program. It is not intended to diagnose MRSA infection nor to guide or monitor treatment for MRSA infections. Performed at [facility], [place] [address] [place], [place] [address]       Radiology Studies: [facility] Chest Port 1 View  Result Date: 09/05/2019 CLINICAL DATA:  Pneumonia, COVID-19 EXAM: PORTABLE CHEST 1 VIEW COMPARISON:  Portable exam 7425 hours compared to 08/30/2019 FINDINGS: Stable heart size and pulmonary vascularity. Pneumomediastinum present, new since previous exam, extending into chest wall and supraclavicular regions. Small LEFT apex pneumothorax. Patchy infiltrates in mid to lower  lungs consistent with multifocal pneumonia and COVID-19. No pleural effusion identified. Atherosclerotic calcification aorta. IMPRESSION: Persistent pulmonary infiltrates consistent with multifocal pneumonia. New pneumomediastinum and small LEFT apex pneumothorax. Critical Value/emergent results were called by telephone at the time of interpretation on 09/05/2019 at 0721 hrs to provider Bonnielee Haff MD, who verbally acknowledged these results. Electronically Signed   By: Lavonia Dana M.D.   On: 09/05/2019 07:25       LOS: 7 days   [place] [facility] Pager on [email or web address]  09/05/2019, 12:53 PM

## 2019-09-06 ENCOUNTER — Inpatient Hospital Stay (HOSPITAL_COMMUNITY): Payer: PPO

## 2019-09-06 LAB — BASIC METABOLIC PANEL
Anion gap: 13 (ref 5–15)
BUN: 49 mg/dL — ABNORMAL HIGH (ref 8–23)
CO2: 27 mmol/L (ref 22–32)
Calcium: 9.3 mg/dL (ref 8.9–10.3)
Chloride: 98 mmol/L (ref 98–111)
Creatinine, Ser: 1.03 mg/dL (ref 0.61–1.24)
GFR calc Af Amer: >60 mL/min (ref >60)
GFR calc non Af Amer: >60 mL/min (ref >60)
Glucose, Bld: 194 mg/dL — ABNORMAL HIGH (ref 70–99)
Potassium: 4.1 mmol/L (ref 3.5–5.1)
Sodium: 138 mmol/L (ref 135–145)

## 2019-09-06 LAB — CBC
HCT: 36.5 % — ABNORMAL LOW (ref 39.0–52.0)
Hemoglobin: 12.1 g/dL — ABNORMAL LOW (ref 13.0–17.0)
MCH: 30.6 pg (ref 26.0–34.0)
MCHC: 33.2 g/dL (ref 30.0–36.0)
MCV: 92.4 fL (ref 80.0–100.0)
Platelets: 443 10*3/uL — ABNORMAL HIGH (ref 150–400)
RBC: 3.95 MIL/uL — ABNORMAL LOW (ref 4.22–5.81)
RDW: 13.3 % (ref 11.5–15.5)
WBC: 15.8 10*3/uL — ABNORMAL HIGH (ref 4.0–10.5)
nRBC: 0 % (ref 0.0–0.2)

## 2019-09-06 LAB — D-DIMER, QUANTITATIVE: D-Dimer, Quant: 3.68 ug/mL-FEU — ABNORMAL HIGH (ref 0.00–0.50)

## 2019-09-06 MED ORDER — PANTOPRAZOLE SODIUM 40 MG IV SOLR
40.0000 mg | Freq: Every day | INTRAVENOUS | Status: DC
  Administered 2019-09-06 – 2019-09-10 (×5): 40 mg via INTRAVENOUS
  Filled 2019-09-06 (×4): qty 40

## 2019-09-06 NOTE — Progress Notes (Signed)
D/w [facility] MD at bedside with visual exam on patient  No overall change. PTx/Pneumomediastinum is very small No change in resp status  Plan  - [facility] will do BID CXR and if PTX worsens or clinical situation changes will call CCM for tube thoracostomy  Ccm will be available   SIGNATURE    Dr. Brand Males, M.D., F.C.C.P,  Pulmonary and Critical Care Medicine Staff Physician, [place] Director - Interstitial Lung Disease  Program  Pulmonary [place] at [place], [place], [address]  Pager: [phone], If no answer  OR between  19:00-7:00h: page [phone] Telephone (clinical office): [phone] Telephone (research): [phone]  10:21 AM 09/06/2019

## 2019-09-06 NOTE — Progress Notes (Signed)
PROGRESS NOTE  David Cantrell  DOB: 11/13/1940  PCP: [name], MD FKC:[id number]  DOA: 09/10/2019  LOS: 8 days   Chief Complaint  Patient presents with  . Shortness of Breath   Brief narrative: David Cantrell is a 79 y.o. male with PMH of HTN, HLD, CAD, CKD 3, COPD bronchiectasis, prostate cancer. 8/7, started having body aches. 8/13, tested positive for COVID-19 as an outpatient. 8/17, he presented at infusion center for monoclonal antibody but was noted to be tachypneic, tachycardic and hypoxic and was hence transferred to ED.  In the ED, he required to be on 15 L oxygen by nasal cannula CRP 13, lactic acid 3.5, chest x-ray showed bilateral infiltrates He was admitted for Covid pneumonia.  Subjective: Patient was seen and examined this morning. propped up in bed.  On heated high flow oxygen at 50 L/min. Does not complain of chest pain. Noted pneumothorax and chest x-ray obtained yesterday.  Assessment/Plan: COVID pneumonia Acute respiratory failure with hypoxia  -Presented with hypoxia -COVID test: Covid PCR positive on 8/13 as an outpatient -Chest imaging: Chest x-ray on admission showed bilateral infiltrates -Treatment: Completed 5-day course of IV remdesivir on 8/21.  Currently on Solu-Medrol IV 50 mg twice daily.  He is also receiving baricitinib since 8/17.   -Lasix being dosed daily depending on clinical condition.  Last dose on 8/24. -Supportive care: Vitamin C, Zinc, inhalers, Tylenol, Antitussives (benzonatate/ Mucinex/Tussionex)  -Protonix while on steroids. -Oxygen - SpO2: (!) 88 % O2 Flow Rate (L/min): 50 L/min FiO2 (%): 100 % -Continue airborne/contact isolation precautions. -WBC, lactic acid, procalcitonin and inflammatory markers trend as below.  Lab Results  Component Value Date   SARSCOV2NAA POSITIVE (A) 08/18/2019   [place] NEGATIVE 03/11/2019   [place] Not Detected 02/03/2019   SARSCOV2NAA NOT DETECTED 06/24/2018    Recent Labs  Lab  08/31/19 0509 08/31/19 0509 09/01/19 0215 09/01/19 0215 09/02/19 0224 09/03/19 0225 09/04/19 0201 09/05/19 0242 09/06/19 0208  WBC 7.6   < > 8.2   < > 7.5 8.5 9.2 9.9 15.8*  DDIMER >20.00*   < > >20.00*   < > >20.00* 11.78* 11.58* 3.88* 3.68*  CRP 11.0*  --  7.7*  --  5.1* 4.4*  --   --   --   ALT 31  --  47*  --  65* 53*  --   --   --    < > = values in this interval not displayed.   The treatment plan and use of medications and known side effects were discussed with patient/family. Some of the medications used are based on case reports/anecdotal data.  All other medications being used in the management of COVID-19 based on limited study data.  Complete risks and long-term side effects are unknown, however in the best clinical judgment they seem to be of some benefit.  Patient wanted to proceed with treatment options provided.  Pneumothorax/pneumomediastinum -Chest x-ray obtained on 8/24 showed evidence for pneumomediastinum and possibly small left apical pneumothorax.   -Pulmonary consulted.  Repeat chest x-ray this morning showed stable findings.   -Does not require any chest tube at this time.  As per pulmonary recommendation, will obtain chest x-ray twice daily for next 2 to 3 days.   Acute bilateral lower extremity DVT Presumed pulmonary embolism -Patient noted to have significantly elevated D-dimer.   -Lower extremity Doppler study showed acute DVT in both legs.  -Patient did have mild hemoptysis.  He likely has acute PE as well.  Could not do CT angiogram due to elevated creatinine as well as order tenuous respiratory status.   -Currently patient remains on therapeutic Lovenox.  -Echocardiogram shows mildly reduced RV function with increase in size.  Left ventricular function is normal. Patient is stable.  No signs of bleeding.  Chest pain Based on history appears to be pleuritic.  Probably related to Covid pneumonia, pneumothorax, PE.  Also has history of bronchiectasis and  CAD.  However, troponin is normal and EKG did not show any ischemic   Chronic kidney disease stage IIIa Renal function stable for the most part.  Continue to monitor urine output.  Monitor creatinine while he is getting diuresed. Recent Labs    08/17/2019 1003 09/10/2019 1819 08/30/19 0442 08/31/19 0509 09/01/19 0215 09/02/19 0224 09/03/19 0225 09/04/19 0201 09/05/19 0242 09/06/19 0208  CREATININE 1.69* 1.24 1.27* 1.46* 1.38* 1.21 1.16 1.37* 1.14 1.03    History of COPD and bronchiectasis Continue bronchodilators.  Essential hypertension Blood pressure is reasonably well controlled.  Continue to monitor.  Not on any antihypertensives at home.  History of prostate cancer Stable.  History of thoracic aortic aneurysm Stable.  Mobility: Encourage ambulation once oxygen requirement improves Code Status:   Code Status: Full Code  Nutritional status: Body mass index is 23.94 kg/m.     Diet Order            Diet regular Room service appropriate? Yes; Fluid consistency: Thin  Diet effective now                 DVT prophylaxis:    Antimicrobials:  None Fluid: None  Consultants: PCCM Family Communication:  None at bedside  Status is: Inpatient  Remains inpatient appropriate because:Ongoing diagnostic testing needed not appropriate for outpatient work up and IV treatments appropriate due to intensity of illness or inability to take PO   Dispo:  Patient From: Home  Planned Disposition: To be determined  Expected discharge date: 09/08/19  Medically stable for discharge: No        Infusions:    Scheduled Meds: . albuterol  2 puff Inhalation Q6H  . alfuzosin  10 mg Oral Daily  . aspirin EC  81 mg Oral Daily  . baricitinib  4 mg Oral Daily  . brimonidine  1 drop Right Eye BID  . Chlorhexidine Gluconate Cloth  6 each Topical Daily  . dorzolamide  1 drop Right Eye BID  . enoxaparin (LOVENOX) injection  1 mg/kg Subcutaneous Q12H  . famotidine  20 mg  Oral Daily  . feeding supplement (ENSURE ENLIVE)  237 mL Oral TID BM  . fenofibrate  160 mg Oral Daily  . latanoprost  1 drop Both Eyes QHS  . mouth rinse  15 mL Mouth Rinse BID  . methylPREDNISolone (SOLU-MEDROL) injection  0.5 mg/kg Intravenous Q12H  . polyethylene glycol  17 g Oral Daily  . rosuvastatin  10 mg Oral Once per day on Mon Thu  . senna  2 tablet Oral BID    Antimicrobials: Anti-infectives (From admission, onward)   Start     Dose/Rate Route Frequency Ordered Stop   08/30/19 1000  remdesivir 100 mg in sodium chloride 0.9 % 100 mL IVPB       "Followed by" Linked Group Details   100 mg 200 mL/hr over 30 Minutes Intravenous Daily 08/15/2019 1247 09/02/19 0926   08/30/19 1000  remdesivir 100 mg in sodium chloride 0.9 % 100 mL IVPB  Status:  Discontinued       "  Followed by" Linked Group Details   100 mg 200 mL/hr over 30 Minutes Intravenous Daily 09/09/2019 1518 08/15/2019 1621   08/15/2019 1600  remdesivir 200 mg in sodium chloride 0.9% 250 mL IVPB  Status:  Discontinued       "Followed by" Linked Group Details   200 mg 580 mL/hr over 30 Minutes Intravenous Once 08/23/2019 1518 08/19/2019 1621   08/22/2019 1400  remdesivir 200 mg in sodium chloride 0.9% 250 mL IVPB       "Followed by" Linked Group Details   200 mg 580 mL/hr over 30 Minutes Intravenous Once 08/20/2019 1247 09/12/2019 1731      PRN meds: chlorpheniramine-HYDROcodone, guaiFENesin-dextromethorphan, nitroGLYCERIN, phenylephrine, sodium chloride   Objective: Vitals:   09/06/19 1200 09/06/19 1525  BP:  135/77  Pulse: 94 82  Resp: (!) 23 18  Temp: (!) 96.7 F (35.9 C)   SpO2: (!) 89% (!) 88%    Intake/Output Summary (Last 24 hours) at 09/06/2019 1640 Last data filed at 09/06/2019 0600 Gross per 24 hour  Intake 550 ml  Output 1050 ml  Net -500 ml   Filed Weights   09/04/19 0345 09/05/19 0424 09/06/19 0600  Weight: 92.2 kg 89 kg 89.2 kg   Weight change: 0.2 kg Body mass index is 23.94 kg/m.   Physical  Exam: General exam: Appears calm and comfortable.  On high flow oxygen.  Not in obvious distress Skin: No rashes, lesions or ulcers. HEENT: Atraumatic, normocephalic, supple neck, no obvious bleeding Lungs: Clear to auscultation bilaterally CVS: Regular rate and rhythm, no murmur GI/Abd soft, nontender, nondistended, bowel sound present CNS: Alert, awake, oriented to place and person.  Hard of hearing Psychiatry: Depressed look Extremities: No pedal edema, no calf tenderness  Data Review: I have personally reviewed the laboratory data and studies available.  Recent Labs  Lab 08/31/19 0509 08/31/19 0509 09/01/19 0215 09/01/19 0215 09/02/19 0224 09/03/19 0225 09/04/19 0201 09/05/19 0242 09/06/19 0208  WBC 7.6   < > 8.2   < > 7.5 8.5 9.2 9.9 15.8*  NEUTROABS 6.5  --  6.8  --  6.5 7.5  --   --   --   HGB 10.5*   < > 10.8*   < > 11.3* 11.1* 12.2* 12.1* 12.1*  HCT 31.4*   < > 33.1*   < > 35.3* 33.8* 36.4* 36.7* 36.5*  MCV 91.8   < > 94.0   < > 93.4 93.9 91.9 92.4 92.4  PLT 286   < > 297   < > 322 335 411* 415* 443*   < > = values in this interval not displayed.   Recent Labs  Lab 09/02/19 0224 09/03/19 0225 09/04/19 0201 09/05/19 0242 09/06/19 0208  NA 143 137 142 136 138  K 4.3 4.2 4.1 4.3 4.1  CL 103 102 101 98 98  CO2 23 22 25 26 27   GLUCOSE 149* 148* 159* 177* 194*  BUN 48* 49* 53* 53* 49*  CREATININE 1.21 1.16 1.37* 1.14 1.03  CALCIUM 8.8* 8.4* 8.9 9.0 9.3   Lab Results  Component Value Date   HGBA1C 5.6 03/19/2015       Component Value Date/Time   CHOL 96 06/28/2018 0603   CHOL 135 01/17/2018 0906   TRIG 106 08/22/2019 1003   TRIG 116 01/15/2006 0828   HDL 23 (L) 06/28/2018 0603   HDL 35 (L) 01/17/2018 0906   CHOLHDL 4.2 06/28/2018 0603   VLDL 17 06/28/2018 0603   LDLCALC 56 06/28/2018 0603  [place] 74 01/17/2018 0906   LDLDIRECT 74.7 02/21/2009 0932   LABVLDL 26 01/17/2018 0906   Signed, Terrilee Croak, MD [facility] Pager: [phone]  (Secure Chat preferred). 09/06/2019

## 2019-09-07 ENCOUNTER — Encounter: Payer: Self-pay | Admitting: Internal Medicine

## 2019-09-07 ENCOUNTER — Inpatient Hospital Stay (HOSPITAL_COMMUNITY): Payer: PPO

## 2019-09-07 LAB — CBC
HCT: 37.8 % — ABNORMAL LOW (ref 39.0–52.0)
Hemoglobin: 12.2 g/dL — ABNORMAL LOW (ref 13.0–17.0)
MCH: 30 pg (ref 26.0–34.0)
MCHC: 32.3 g/dL (ref 30.0–36.0)
MCV: 92.9 fL (ref 80.0–100.0)
Platelets: 386 10*3/uL (ref 150–400)
RBC: 4.07 MIL/uL — ABNORMAL LOW (ref 4.22–5.81)
RDW: 13.3 % (ref 11.5–15.5)
WBC: 14.1 10*3/uL — ABNORMAL HIGH (ref 4.0–10.5)
nRBC: 0 % (ref 0.0–0.2)

## 2019-09-07 LAB — HEMOGLOBIN A1C
Hgb A1c MFr Bld: 6.7 % — ABNORMAL HIGH (ref 4.8–5.6)
Mean Plasma Glucose: 145.59 mg/dL

## 2019-09-07 LAB — GLUCOSE, CAPILLARY
Glucose-Capillary: 168 mg/dL — ABNORMAL HIGH (ref 70–99)
Glucose-Capillary: 192 mg/dL — ABNORMAL HIGH (ref 70–99)
Glucose-Capillary: 229 mg/dL — ABNORMAL HIGH (ref 70–99)
Glucose-Capillary: 164 mg/dL — ABNORMAL HIGH (ref 70–99)

## 2019-09-07 LAB — BASIC METABOLIC PANEL
Anion gap: 12 (ref 5–15)
BUN: 49 mg/dL — ABNORMAL HIGH (ref 8–23)
CO2: 28 mmol/L (ref 22–32)
Calcium: 9.1 mg/dL (ref 8.9–10.3)
Chloride: 97 mmol/L — ABNORMAL LOW (ref 98–111)
Creatinine, Ser: 1.02 mg/dL (ref 0.61–1.24)
GFR calc Af Amer: >60 mL/min (ref >60)
GFR calc non Af Amer: >60 mL/min (ref >60)
Glucose, Bld: 182 mg/dL — ABNORMAL HIGH (ref 70–99)
Potassium: 4.9 mmol/L (ref 3.5–5.1)
Sodium: 137 mmol/L (ref 135–145)

## 2019-09-07 LAB — D-DIMER, QUANTITATIVE: D-Dimer, Quant: 3.36 ug/mL-FEU — ABNORMAL HIGH (ref 0.00–0.50)

## 2019-09-07 MED ORDER — INSULIN ASPART 100 UNIT/ML ~~LOC~~ SOLN
0.0000 [IU] | Freq: Three times a day (TID) | SUBCUTANEOUS | Status: DC
  Administered 2019-09-07: 2 [IU] via SUBCUTANEOUS
  Administered 2019-09-07: 3 [IU] via SUBCUTANEOUS
  Administered 2019-09-07 – 2019-09-08 (×2): 2 [IU] via SUBCUTANEOUS
  Administered 2019-09-08: 1 [IU] via SUBCUTANEOUS
  Administered 2019-09-08: 3 [IU] via SUBCUTANEOUS
  Administered 2019-09-09: 2 [IU] via SUBCUTANEOUS
  Administered 2019-09-09: 1 [IU] via SUBCUTANEOUS
  Administered 2019-09-09 – 2019-09-10 (×4): 2 [IU] via SUBCUTANEOUS

## 2019-09-07 MED ORDER — FUROSEMIDE 10 MG/ML IJ SOLN
40.0000 mg | Freq: Once | INTRAMUSCULAR | Status: AC
  Administered 2019-09-07: 40 mg via INTRAVENOUS
  Filled 2019-09-07: qty 4

## 2019-09-07 MED ORDER — SODIUM CHLORIDE 0.9 % IV BOLUS
1000.0000 mL | Freq: Once | INTRAVENOUS | Status: AC
  Administered 2019-09-07: 1000 mL via INTRAVENOUS

## 2019-09-07 MED ORDER — INSULIN ASPART 100 UNIT/ML ~~LOC~~ SOLN
0.0000 [IU] | Freq: Every day | SUBCUTANEOUS | Status: DC
  Administered 2019-09-09: 2 [IU] via SUBCUTANEOUS

## 2019-09-07 MED ORDER — METHYLPREDNISOLONE SODIUM SUCC 125 MG IJ SOLR
60.0000 mg | Freq: Four times a day (QID) | INTRAMUSCULAR | Status: DC
  Administered 2019-09-07 – 2019-09-10 (×12): 60 mg via INTRAVENOUS
  Filled 2019-09-07 (×12): qty 2

## 2019-09-07 NOTE — Progress Notes (Signed)
eLink Physician-Brief Progress Note Patient Name: David Cantrell DOB: October 22, 1940 MRN: [id number]   Date of Service  09/07/2019  HPI/Events of Note  Multiple issues: 1. Hyperglycemia - Blood glucose  = 182 Patient is on PO diet and 2. Oliguria - Patient has made about 25 mL of urine per hour. LVEF = 60-65%.  eICU Interventions  Plan: 1. AC/HS sensitive Novolog SSI. 2. Bolus with 0.9 NaCl 1 liter IV over 1 hour now.      Intervention Category Major Interventions: Other:;Hyperglycemia - active titration of insulin therapy  Kwame Ryland Cornelia Copa 09/07/2019, 4:26 AM

## 2019-09-07 NOTE — Consult Note (Addendum)
NAME:  David Cantrell, MRN:  [id number], DOB:  11/08/1940, LOS: 9 ADMISSION DATE:  08/17/2019, CONSULTATION DATE:  09/05/19 REFERRING MD:  Dr Curly Rim, CHIEF COMPLAINT:  resp failure with concern for left apical pneumothorax   Brief History   Concern for left apical ptx and pneumomediastinum in setting of covid  History of present illness    79 year old male,, prior smoker, no hx of covid vaccination. Symptomatic since 08/19/19. S/P positive covid 08/25/19 and admitted 09/02/2019 with acute hypoxemic resp failure. COVID Rx by triad MD. On 09/04/19 on HHFNO 50L/min and NRB. Also on [facility] Rx with steroids, oral baricitinib and intermittent lasix and s.p remedesivir. On 09/05/19 routine cxr raised concern for small left apical pneumothorax and left pneumomediastinum and ccm consulted. No overt clinical change    Past Medical History    has a past surgical history that includes ESI; Cystoscopy with retrograde pyelogram, ureteroscopy and stent placement (Right, 02/02/2013); Colonoscopy w/ polypectomy (last one 05-29-2015); Hemorrhoid surgery (07-03-1999); Cardiac catheterization (09-04-2002  DR Wynonia Lawman); Cardiac catheterization (06-05-2010  DR Fidela Juneau); Shoulder open rotator cuff repair (Bilateral, RIGHT X2  LAST ONE 1997/  LEFT 2005); Cystoscopy with retrograde pyelogram, ureteroscopy and stent placement (Right, 02/15/2013); Holmium laser application (Right, 01/18/6158); Video bronchoscopy (Bilateral, 09/24/2014); Prostate biopsy; Cardiovascular stress test (06-01-2013   dr Wynonia Lawman); Radioactive seed implant (N/A, 04/07/2016); Cystoscopy (N/A, 04/07/2016); Colonoscopy; LEFT HEART CATH AND CORONARY ANGIOGRAPHY (N/A, 06/28/2018); Polypectomy; and Anterior cervical decomp/discectomy fusion (N/A, 03/15/2019).   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[place]), Arthritis, Bronchiectasis ([place]), CAD (coronary artery disease), Cancer ([place]), Cataract, Centrilobular emphysema ([place]), Congenital renal cyst, COPD (chronic obstructive pulmonary disease) ([place]), Diverticulosis of colon, Dyspnea, Emphysema of lung ([place]), First degree heart block, Hemorrhoids, History of adenomatous polyp of colon, History of basal cell carcinoma excision, History of external beam radiation therapy (02-01-2016  to 03-16-2016), History of kidney stones, Hyperlipidemia, Hypertension, Macular degeneration, Multiple pulmonary nodules, Open-angle glaucoma, Pneumonia, Prostate cancer [facility]) (urologist-  dr eskridge/  oncologist-  dr Tammi Klippel), Seasonal allergies, Thoracic aortic aneurysm ([place]), and Wears dentures.   reports that he quit smoking about 49 years ago. His smoking use included cigarettes. He has a 6.00 pack-year smoking history. He has never used smokeless tobacco.  Significant Hospital Events   09/01/2019 - admit 8/24 - ccm consult  Consults:  8/24 - ccm consult  Procedures:  x  Significant Diagnostic Tests:  x  Micro Data:   8/17 - covid - positive 8/17 - blood culture - neg so far 8/19 - MRSA PCR - neg     Antimicrobials:   Anti-covid Olumiant 8/17 (x 14 days) Remdesivir 8/17  Solumedrol  Rx dose lovenox (DVT)   Interim history/subjective:   8/26 - cxr with  tiny apical ptx per report  (not sure by this MD) and pneumomediastinum (Agree) - both stable. LFT better., Creat stable. Sat on chair yesterday. Overall still on 50L HHFNC without improvement. He feels slow fatigue. He is on Rx dose lovenox  Objective   Blood pressure (!) 148/69, pulse 89, temperature  97.9 F (36.6 C), temperature source Axillary, resp. rate (!) 21, height 6\' 4"  (1.93 m), weight 89.8 kg, SpO2 (!) 87 %.    FiO2 (%):  [100  %] 100 %   Intake/Output Summary (Last 24 hours) at 09/07/2019 0855 Last data filed at 09/07/2019 0230 Gross per 24 hour  Intake 300 ml  Output 875 ml  Net -575 ml   Filed Weights   09/05/19 0424 09/06/19 0600 09/07/19 0409  Weight: 89 kg 89.2 kg 89.8 kg   General Appearance:  Looks ill on o2 but no increased wob. Looks deconditioned Head:  Normocephalic, without obvious abnormality, atraumatic Eyes:  PERRL - yes, conjunctiva/corneas - clear     Ears:  Normal external ear canals, both ears Nose:  G tube - no Throat:  ETT TUBE - no , OG tube - no Neck:  Supple,  No enlargement/tenderness/nodules Lungs: No paradoxical respiration. Has crackles. Mild tachypnea Heart:  S1 and S2 normal, no murmur, CVP - no.  Pressors - no Abdomen:  Soft, no masses, no organomegaly Genitalia / Rectal:  Not done Extremities:  Extremities- intact Skin:  ntact in exposed areas . Sacral area - not examined Neurologic:  Sedation - none -> RASS - +1 . Moves all 4s - yes. CAM-ICU - neg . Orientation - x3+      Resolved Hospital Problem list   x  Assessment & Plan:  Acute Hypoxemic Resp Failure to UXLKG-40  Course complicated by concern for left apical pneumothorax and pneumomediastinum - 09/05/19   On 09/07/2019 - overall no changed. Biapical ptx are too small in my view to warrant chest tube.   Plan  - hold off bipap due to ptx and pneumomediastinum today - normally should be a bipap caondidate based on new data on recovery trial - serial cxr - if worsens ptx then place chest tube - patient informed - he is agreeable   Others   - recommend goals of care by primary service  - check CK/Lactate as is on statin + fenofibrate   Best practice:  Per triad  D/w Dr Curly Rim     ATTESTATION & SIGNATURE   The patient David Cantrell is critically ill with multiple organ systems failure and requires high complexity decision making for assessment and support, frequent evaluation and titration of  therapies, application of advanced monitoring technologies and extensive interpretation of multiple databases.   Critical Care Time devoted to patient care services described in this note is  35  Minutes. This time reflects time of care of this signee Dr Brand Males. This critical care time does not reflect procedure time, or teaching time or supervisory time of PA/NP/Med student/Med Resident etc but could involve care discussion time     Dr. Brand Males, M.D., [facility].C.P Pulmonary and Critical Care Medicine Staff Physician [place] [facility] Pager: [phone], If no answer or between  15:00h - 7:00h: call [phone]  09/07/2019 10:08 AM    LABS   Results for Martenson, RABON SCHOLLE "TOM" (MRN [id number]) as of 09/07/2019 10:07  Ref. Range 09/03/2019 02:25 09/04/2019 02:01 09/05/2019 02:42 09/06/2019 02:08 09/07/2019 02:13  D-Dimer, Quant Latest Ref Range: 0.00 - 0.50 ug/mL-FEU 11.78 (H) 11.58 (H) 3.88 (H) 3.68 (H) 3.36 (H)   PULMONARY No results for input(s): PHART, PCO2ART, PO2ART, HCO3, TCO2, O2SAT in the last 168 hours.  Invalid input(s): PCO2, PO2  CBC Recent Labs  Lab 09/05/19 0242 09/06/19 0208  09/07/19 0213  HGB 12.1* 12.1* 12.2*  HCT 36.7* 36.5* 37.8*  WBC 9.9 15.8* 14.1*  PLT 415* 443* 386    COAGULATION Recent Labs  Lab 09/04/19 0759  INR 1.5*    CARDIAC  No results for input(s): TROPONINI in the last 168 hours. No results for input(s): PROBNP in the last 168 hours.   CHEMISTRY Recent Labs  Lab 09/03/19 0225 09/03/19 0225 09/04/19 0201 09/04/19 0201 09/05/19 0242 09/05/19 0242 09/06/19 0208 09/07/19 0213  NA 137  --  142  --  136  --  138 137  K 4.2   < > 4.1   < > 4.3   < > 4.1 4.9  CL 102  --  101  --  98  --  98 97*  CO2 22  --  25  --  26  --  27 28  GLUCOSE 148*  --  159*  --  177*  --  194* 182*  BUN 49*  --  53*  --  53*  --  49* 49*  CREATININE 1.16  --  1.37*  --  1.14  --  1.03 1.02  CALCIUM  8.4*  --  8.9  --  9.0  --  9.3 9.1   < > = values in this interval not displayed.   Estimated Creatinine Clearance: 74.5 mL/min (by C-G formula based on SCr of 1.02 mg/dL).   LIVER Recent Labs  Lab 09/01/19 0215 09/02/19 0224 09/03/19 0225 09/04/19 0759  AST 65* 65* 41  --   ALT 47* 65* 53*  --   ALKPHOS 74 82 80  --   BILITOT 0.7 1.0 0.7  --   PROT 7.0 7.1 6.6  --   ALBUMIN 2.4* 2.5* 2.4*  --   INR  --   --   --  1.5*     INFECTIOUS No results for input(s): LATICACIDVEN, PROCALCITON in the last 168 hours.   ENDOCRINE CBG (last 3)  Recent Labs    09/07/19 0814  GLUCAP 168*         IMAGING x48h  - image(s) personally visualized  -   highlighted in bold DG Chest 1 View  Result Date: 09/07/2019 CLINICAL DATA:  Pneumothorax.  COVID positive. EXAM: CHEST  1 VIEW COMPARISON:  09/06/2019. FINDINGS: Stable pneumomediastinum. Stable cardiomegaly. Diffuse bilateral pulmonary interstitial infiltrates/edema again noted. No pleural effusion. Stable small biapical pneumothoraces. Stable bilateral subcutaneous emphysema. No acute bony abnormality identified. IMPRESSION: 1. Stable pneumomediastinum. Stable small bilateral apical pneumothoraces. Stable bilateral chest wall subcutaneous emphysema. 2.  Stable cardiomegaly. 3. Diffuse bilateral interstitial infiltrates/edema again noted in this known COVID positive patient. No interim change. Electronically Signed   By: Marcello Moores  Register   On: 09/07/2019 06:27   DG CHEST PORT 1 VIEW  Result Date: 09/06/2019 CLINICAL DATA:  Follow-up pneumothorax EXAM: PORTABLE CHEST 1 VIEW COMPARISON:  Film from earlier in the same day. FINDINGS: Cardiac shadow is stable. Small biapical pneumothoraces are again seen and stable. Subcutaneous emphysema is noted. Diffuse airspace opacities are again identified bilaterally worst in the bases stable from the recent exam. Postsurgical changes in the cervical spine are noted. IMPRESSION: Stable airspace opacities  bilaterally. Stable biapical pneumothoraces. Electronically Signed   By: Inez Catalina M.D.   On: 09/06/2019 20:23   DG CHEST PORT 1 VIEW  Result Date: 09/06/2019 CLINICAL DATA:  Pneumothorax. EXAM: PORTABLE CHEST 1 VIEW COMPARISON:  Same day. FINDINGS: The heart size and mediastinal contours are within normal limits.  Stable minimal biapical pneumothorax is are noted. Stable subcutaneous emphysema is noted bilaterally. Stable pneumomediastinum is noted. Stable bibasilar opacities are noted concerning for edema or atelectasis. The visualized skeletal structures are unremarkable. IMPRESSION: Stable minimal biapical pneumothorax. Stable pneumomediastinum. Stable subcutaneous emphysema. Stable bibasilar opacities are noted concerning for edema or atelectasis. Electronically Signed   By: Marijo Conception M.D.   On: 09/06/2019 08:11   DG CHEST PORT 1 VIEW  Result Date: 09/06/2019 CLINICAL DATA:  79 year old male with positive COVID-19. Follow-up pneumothorax. EXAM: PORTABLE CHEST 1 VIEW COMPARISON:  Earlier radiograph dated 09/05/2019. FINDINGS: Small biapical pneumothorax without significant interval change. Diffuse bilateral pulmonary opacities as seen previously. There is pneumomediastinum and upper thoracic soft tissue emphysema similar to prior radiograph. Stable cardiomediastinal silhouette. No acute osseous pathology. IMPRESSION: 1. No significant interval change in the small biapical pneumothorax. Continued follow-up recommended. 2. Diffuse bilateral pulmonary opacities as seen previously. Electronically Signed   By: Anner Crete M.D.   On: 09/06/2019 02:08   DG CHEST PORT 1 VIEW  Result Date: 09/05/2019 CLINICAL DATA:  COVID-19 infection. Tachypnea, tachycardia, and hypoxia. EXAM: PORTABLE CHEST 1 VIEW COMPARISON:  09/05/2019 at 4:54 a.m. FINDINGS: The cardiomediastinal silhouette is unchanged. Pneumomediastinum is again noted with subcutaneous emphysema also present in the chest wall and neck  bilaterally. A small left apical pneumothorax is unchanged, while there is a new small right apical pneumothorax. Patchy bilateral lung opacities, greatest in the lower lungs, are unchanged. No sizable pleural effusion is identified. IMPRESSION: 1. Small bilateral apical pneumothoraces, new on the right and unchanged on the left. 2. Unchanged bilateral lung opacities consistent with pneumonia. Electronically Signed   By: Logan Bores M.D.   On: 09/05/2019 15:07

## 2019-09-07 NOTE — Evaluation (Signed)
Occupational Therapy Evaluation Patient Details Name: David Cantrell MRN: [id number] DOB: [birth year] Today's Date: 09/07/2019    History of Present Illness Pt admiited 2* SOB with COVID PNA and with hx of Prostate CA, Thoracic Aortic Aneurysm, Macular degeneration, COPD, CAD, R RCR, Anterior Cervical Fusion, and CKD   Clinical Impression   David Cantrell is a 79 year old man currently on 50 L HFNC and 100% Fio2 on day 9 of hospitalization. On evaluation patient presents with generalized weakness, decreased activity tolerance, and impaired balance resulting in decreased ability to perform functional mobility and ADLs. When therapist entered room patient supine in bed with HR of 94, o2 sat 90 and RR 19. Patient's upper body strength tested in supine and patient's o2 sat dropped to 85% and RR 30 but recovered quickly. Patient mod assist to transfer to side of bed and maintained edge of bed sitting x 17 minutes to work on improving activity tolerance. Patient stood x 1 with min assist for less than 10 seconds. o2 sats dropped to 84% and took several minutes to recover. Patient reports fatigue and weakness and returned to supine with sats dropping to 82%. Patient's poor activity tolerance and impaired cardiopulmonary endurance as well as his reports of weakness and fatigue has significantly limited his ADLs to bed level. Patient will benefit from skilled OT services to improve deficits, activity tolerance and learn compensatory strategies as needed in order to return to PLOF. Therapist recommends short term rehab at discharge.     Follow Up Recommendations  SNF    Equipment Recommendations  Other (comment) (TBD at next venue)    Recommendations for Other Services       Precautions / Restrictions Precautions Precautions: Fall;Other (comment) Precaution Comments: Monitor sats Restrictions Weight Bearing Restrictions: No      Mobility Bed Mobility Overal bed mobility: Needs Assistance Bed  Mobility: Supine to Sit     Supine to sit: HOB elevated;Mod assist     General bed mobility comments: Increased time and mod assist for trunk and LEs to complete transition to EOB sitting  Transfers Overall transfer level: Needs assistance Equipment used: Rolling walker (2 wheeled) Transfers: Sit to/from Stand Sit to Stand: Min assist         General transfer comment: Min assis to stand holding onto walker. min guard to take steps to head of bed.    Balance Overall balance assessment: Needs assistance Sitting-balance support: No upper extremity supported;Feet supported Sitting balance-Leahy Scale: Fair     Standing balance support: Bilateral upper extremity supported Standing balance-Leahy Scale: Poor                             ADL either performed or assessed with clinical judgement   ADL Overall ADL's : Needs assistance/impaired Eating/Feeding: Set up   Grooming: Set up;Sitting   Upper Body Bathing: Set up;Moderate assistance;Bed level   Lower Body Bathing: Set up;Bed level;Maximal assistance   Upper Body Dressing : Maximal assistance;Bed level   Lower Body Dressing: Total assistance;Bed level   Toilet Transfer: Stand-pivot;BSC;Minimal assistance;RW   Toileting- Clothing Manipulation and Hygiene: Total assistance;Sit to/from stand       Functional mobility during ADLs: Minimal assistance;Rolling walker       Vision   Vision Assessment?: No apparent visual deficits     Perception     Praxis      Pertinent Vitals/Pain Pain Assessment: No/denies pain     Hand  Dominance Right   Extremity/Trunk Assessment Upper Extremity Assessment Upper Extremity Assessment: RUE deficits/detail;LUE deficits/detail RUE Deficits / Details: 4-/5 shoulder, 4/5 elbow, 5/5 wrist, 4/5 grip RUE Sensation: WNL RUE Coordination: WNL LUE Deficits / Details: 4/5 shouler, 4/5 elbow, 5/5 wrist, 4/5 grip LUE Sensation: WNL LUE Coordination: WNL   Lower  Extremity Assessment Lower Extremity Assessment: Defer to PT evaluation   Cervical / Trunk Assessment Cervical / Trunk Assessment: Normal   Communication Communication Communication: No difficulties   Cognition Arousal/Alertness: Awake/alert Behavior During Therapy: WFL for tasks assessed/performed Overall Cognitive Status: Within Functional Limits for tasks assessed                                     General Comments       Exercises     Shoulder Instructions      Home Living Family/patient expects to be discharged to:: Private residence Living Arrangements: Spouse/significant other Available Help at Discharge: Family;Available 24 hours/day Type of Home: House Home Access: Level entry     Home Layout: One level     Bathroom Shower/Tub: Tub/shower unit;Door   [employer] Toilet: Standard Bathroom Accessibility: Yes   Home Equipment: None          Prior Functioning/Environment Level of Independence: Independent                 OT Problem List: Decreased strength;Decreased activity tolerance;Impaired balance (sitting and/or standing);Cardiopulmonary status limiting activity      OT Treatment/Interventions: Self-care/ADL training;Therapeutic exercise;DME and/or AE instruction;Energy conservation;Therapeutic activities;Balance training;Patient/family education    OT Goals(Current goals can be found in the care plan section) Acute Rehab OT Goals Patient Stated Goal: Regain IND OT Goal Formulation: With patient Time For Goal Achievement: 09/21/19 Potential to Achieve Goals: Fair  OT Frequency: Min 2X/week   Barriers to D/C:            Co-evaluation              AM-PAC OT "6 Clicks" Daily Activity     Outcome Measure Help from another person eating meals?: A Little Help from another person taking care of personal grooming?: A Little Help from another person toileting, which includes using toliet, bedpan, or urinal?: Total Help  from another person bathing (including washing, rinsing, drying)?: A Lot Help from another person to put on and taking off regular upper body clothing?: A Lot Help from another person to put on and taking off regular lower body clothing?: Total 6 Click Score: 12   End of Session Equipment Utilized During Treatment: Rolling walker;Gait belt Nurse Communication: Mobility status  Activity Tolerance: Patient limited by fatigue Patient left: in bed;with call bell/phone within reach;with bed alarm set  OT Visit Diagnosis: Unsteadiness on feet (R26.81);Muscle weakness (generalized) (M62.81)                Time: 1540-0867 OT Time Calculation (min): 34 min Charges:  OT General Charges $OT Visit: 1 Visit OT Evaluation $OT Eval Moderate Complexity: 1 Mod OT Treatments $Therapeutic Activity: 8-22 mins  Aarav Burgett, OTR/L [place] [phone] Pager: [phone]   Lenward Chancellor 09/07/2019, 12:49 PM

## 2019-09-07 NOTE — TOC Progression Note (Signed)
Transition of Care [facility]) - Progression Note    Patient Details  Name: David Cantrell MRN: [id number] Date of Birth: 1940/03/13  [facility] CM/SW Contact  Leeroy Cha, RN Phone Number: 09/07/2019, 8:28 AM  Clinical Narrative:    HFNCNRBM@50l /min, iv solumedrol, d dimer 3.36, wbc 14.1, pt is oliguric with bun of 49.  Following for progression and toc needs. Plan is to return to home.   Expected Discharge Plan: Home/Self Care Barriers to Discharge: Continued Medical Work up  Expected Discharge Plan and Services Expected Discharge Plan: Home/Self Care   Discharge Planning Services: CM Consult   Living arrangements for the past 2 months: Single Family Home                                       Social Determinants of Health (SDOH) Interventions    Readmission Risk Interventions No flowsheet data found.

## 2019-09-07 NOTE — Progress Notes (Signed)
PROGRESS NOTE  David Cantrell  DOB: 1940-03-05  PCP: [name], MD POE:[id number]  DOA: 08/14/2019  LOS: 9 days   Chief Complaint  Patient presents with  . Shortness of Breath   Brief narrative: David Cantrell is a 79 y.o. male with PMH of HTN, HLD, CAD, CKD 3, COPD bronchiectasis, prostate cancer. 8/7, started having body aches. 8/13, tested positive for COVID-19 as an outpatient. 8/17, he presented at infusion center for monoclonal antibody but was noted to be tachypneic, tachycardic and hypoxic and was hence transferred to ED.  In the ED, he required to be on 15 L oxygen by nasal cannula CRP 13, lactic acid 3.5, chest x-ray showed bilateral infiltrates He was admitted for Covid pneumonia.  Subjective: Patient was seen and examined this morning.   No complaints.   Remains on heated high flow oxygen by nasal cannula at 50 L/min. Labs with WBC count down to 14.1, A1c 6.7, D-dimer down to 3.36, creatinine 1.02. Pneumothorax stable.   Assessment/Plan: COVID pneumonia Acute respiratory failure with hypoxia  -Presented with hypoxia -COVID test: Covid PCR positive on 8/13 as an outpatient -Chest imaging: Chest x-ray on admission showed bilateral infiltrates -Treatment: Completed 5-day course of IV remdesivir on 8/21. Currently on Solu-Medrol IV 50 mg twice daily.  He is also receiving baricitinib since 8/17.    -Lasix being dosed daily depending on clinical condition.  Last dose on 8/24. -Supportive care: Vitamin C, Zinc, inhalers, Tylenol, Antitussives (benzonatate/ Mucinex/Tussionex)  -Protonix while on steroids.  -Patient's respiratory status still tenuous. Currently requiring 50 L/min of oxygen. I will give him a dose of Lasix 40 mg IV this afternoon. I will also increase his Solu-Medrol dose to 60 mg every 6 hours. -Continue airborne/contact isolation precautions. -WBC and inflammatory markers trend as below.  Lab Results  Component Value Date   SARSCOV2NAA POSITIVE (A)  08/19/2019   [place] NEGATIVE 03/11/2019   [place] Not Detected 02/03/2019   SARSCOV2NAA NOT DETECTED 06/24/2018    Recent Labs  Lab 09/01/19 0215 09/01/19 0215 09/02/19 0224 09/02/19 0224 09/03/19 0225 09/04/19 0201 09/05/19 0242 09/06/19 0208 09/07/19 0213  WBC 8.2   < > 7.5   < > 8.5 9.2 9.9 15.8* 14.1*  DDIMER >20.00*   < > >20.00*   < > 11.78* 11.58* 3.88* 3.68* 3.36*  CRP 7.7*  --  5.1*  --  4.4*  --   --   --   --   ALT 47*  --  65*  --  53*  --   --   --   --    < > = values in this interval not displayed.   The treatment plan and use of medications and known side effects were discussed with patient/family. Some of the medications used are based on case reports/anecdotal data.  All other medications being used in the management of COVID-19 based on limited study data.  Complete risks and long-term side effects are unknown, however in the best clinical judgment they seem to be of some benefit.  Patient wanted to proceed with treatment options provided.  Pneumothorax/pneumomediastinum -Chest x-ray obtained on 8/24 showed evidence for pneumomediastinum and possibly small left apical pneumothorax.   -Pulmonary consulted.  Subsequent chest x-rays have been stable. -Pulmonary consult appreciated.  Patient does not require any chest tube at this time.  Repeat chest x-ray this evening and tomorrow morning.  Acute bilateral lower extremity DVT Presumed pulmonary embolism -Patient noted to have significantly elevated D-dimer.   -  Lower extremity Doppler study showed acute DVT in both legs.  -Patient did have mild hemoptysis.  He likely has acute PE as well. Could not do CT angiogram due to elevated creatinine as well as order tenuous respiratory status.   -Currently patient remains on therapeutic Lovenox.  -Echocardiogram shows mildly reduced RV function with increase in size.  Left ventricular function is normal. Patient is stable.  No signs of bleeding..  Hemoglobin  stable. Recent Labs    09/01/2019 1819 08/30/19 0442 08/31/19 0509 09/01/19 0215 09/02/19 0224 09/03/19 0225 09/04/19 0201 09/05/19 0242 09/06/19 0208 09/07/19 0213  HGB 8.7* 10.2* 10.5* 10.8* 11.3* 11.1* 12.2* 12.1* 12.1* 12.2*   Chest pain Based on history appears to be pleuritic.  Probably related to Covid pneumonia, pneumothorax, PE.  Also has history of bronchiectasis and CAD.  However, troponin is normal and EKG did not show any ischemic   Chronic kidney disease stage IIIa Renal function stable for the most part. Continue to monitor urine output. Monitor creatinine while he is getting diuresed. Recent Labs    08/23/2019 1819 08/30/19 0442 08/31/19 0509 09/01/19 0215 09/02/19 0224 09/03/19 0225 09/04/19 0201 09/05/19 0242 09/06/19 0208 09/07/19 0213  CREATININE 1.24 1.27* 1.46* 1.38* 1.21 1.16 1.37* 1.14 1.03 1.02   History of COPD and bronchiectasis Continue bronchodilators.  Essential hypertension Blood pressure is reasonably well controlled. Continue to monitor. Not on any antihypertensives at home.  History of prostate cancer Stable.  History of thoracic aortic aneurysm Stable.  Mobility: Encourage ambulation once oxygen requirement improves Code Status:   Code Status: Full Code  Nutritional status: Body mass index is 24.1 kg/m.     Diet Order            Diet regular Room service appropriate? Yes; Fluid consistency: Thin  Diet effective now                 DVT prophylaxis:  full dose Lovenox subcu  Antimicrobials:  None  Fluid: None  Consultants: PCCM Family Communication: : Updated patient's wife this afternoon.  Status is: Inpatient  Remains inpatient appropriate because:Ongoing diagnostic testing needed not appropriate for outpatient work up and IV treatments appropriate due to intensity of illness or inability to take PO   Dispo:  Patient From: Home  Planned Disposition: To be determined  Expected discharge date: To be  determined.  Medically stable for discharge: No    Infusions:    Scheduled Meds: . albuterol  2 puff Inhalation Q6H  . alfuzosin  10 mg Oral Daily  . aspirin EC  81 mg Oral Daily  . baricitinib  4 mg Oral Daily  . brimonidine  1 drop Right Eye BID  . Chlorhexidine Gluconate Cloth  6 each Topical Daily  . dorzolamide  1 drop Right Eye BID  . enoxaparin (LOVENOX) injection  1 mg/kg Subcutaneous Q12H  . feeding supplement (ENSURE ENLIVE)  237 mL Oral TID BM  . fenofibrate  160 mg Oral Daily  . insulin aspart  0-5 Units Subcutaneous QHS  . insulin aspart  0-9 Units Subcutaneous TID WC  . latanoprost  1 drop Both Eyes QHS  . mouth rinse  15 mL Mouth Rinse BID  . methylPREDNISolone (SOLU-MEDROL) injection  0.5 mg/kg Intravenous Q12H  . pantoprazole (PROTONIX) IV  40 mg Intravenous Daily  . polyethylene glycol  17 g Oral Daily  . rosuvastatin  10 mg Oral Once per day on Mon Thu  . senna  2 tablet Oral BID  Antimicrobials: Anti-infectives (From admission, onward)   Start     Dose/Rate Route Frequency Ordered Stop   08/30/19 1000  remdesivir 100 mg in sodium chloride 0.9 % 100 mL IVPB       "Followed by" Linked Group Details   100 mg 200 mL/hr over 30 Minutes Intravenous Daily 08/20/2019 1247 09/02/19 0926   08/30/19 1000  remdesivir 100 mg in sodium chloride 0.9 % 100 mL IVPB  Status:  Discontinued       "Followed by" Linked Group Details   100 mg 200 mL/hr over 30 Minutes Intravenous Daily 08/21/2019 1518 09/02/2019 1621   08/13/2019 1600  remdesivir 200 mg in sodium chloride 0.9% 250 mL IVPB  Status:  Discontinued       "Followed by" Linked Group Details   200 mg 580 mL/hr over 30 Minutes Intravenous Once 09/05/2019 1518 08/31/2019 1621   08/27/2019 1400  remdesivir 200 mg in sodium chloride 0.9% 250 mL IVPB       "Followed by" Linked Group Details   200 mg 580 mL/hr over 30 Minutes Intravenous Once 08/15/2019 1247 09/01/2019 1731      PRN meds: chlorpheniramine-HYDROcodone,  guaiFENesin-dextromethorphan, nitroGLYCERIN, phenylephrine, sodium chloride   Objective: Vitals:   09/07/19 1200 09/07/19 1400  BP:  (!) 164/69  Pulse:  96  Resp:  (!) 23  Temp: (!) 97.4 F (36.3 C)   SpO2: 93% 94%    Intake/Output Summary (Last 24 hours) at 09/07/2019 1402 Last data filed at 09/07/2019 1320 Gross per 24 hour  Intake 1350 ml  Output 1075 ml  Net 275 ml   Filed Weights   09/05/19 0424 09/06/19 0600 09/07/19 0409  Weight: 89 kg 89.2 kg 89.8 kg   Weight change: 0.6 kg Body mass index is 24.1 kg/m.   Physical Exam: General exam: Appears calm and comfortable.  Continues to remain on high flow oxygen.  Not in obvious distress Skin: No rashes, lesions or ulcers. HEENT: Atraumatic, normocephalic, supple neck, no obvious bleeding Lungs: Clear to auscultation bilaterally.  CVS: Regular rate and rhythm, no murmur GI/Abd soft, nontender, nondistended, bowel sound present CNS: Alert, awake, oriented to place and person.  Hard of hearing Psychiatry: Depressed look. Extremities: No pedal edema, no calf tenderness  Data Review: I have personally reviewed the laboratory data and studies available.  Recent Labs  Lab 09/01/19 0215 09/01/19 0215 09/02/19 0224 09/02/19 0224 09/03/19 0225 09/04/19 0201 09/05/19 0242 09/06/19 0208 09/07/19 0213  WBC 8.2   < > 7.5   < > 8.5 9.2 9.9 15.8* 14.1*  NEUTROABS 6.8  --  6.5  --  7.5  --   --   --   --   HGB 10.8*   < > 11.3*   < > 11.1* 12.2* 12.1* 12.1* 12.2*  HCT 33.1*   < > 35.3*   < > 33.8* 36.4* 36.7* 36.5* 37.8*  MCV 94.0   < > 93.4   < > 93.9 91.9 92.4 92.4 92.9  PLT 297   < > 322   < > 335 411* 415* 443* 386   < > = values in this interval not displayed.   Recent Labs  Lab 09/03/19 0225 09/04/19 0201 09/05/19 0242 09/06/19 0208 09/07/19 0213  NA 137 142 136 138 137  K 4.2 4.1 4.3 4.1 4.9  CL 102 101 98 98 97*  CO2 22 25 26 27 28   GLUCOSE 148* 159* 177* 194* 182*  BUN 49* 53* 53* 49* 49*  CREATININE  1.16 1.37* 1.14 1.03 1.02  CALCIUM 8.4* 8.9 9.0 9.3 9.1   Lab Results  Component Value Date   HGBA1C 6.7 (H) 09/07/2019       Component Value Date/Time   CHOL 96 06/28/2018 0603   CHOL 135 01/17/2018 0906   TRIG 106 08/31/2019 1003   TRIG 116 01/15/2006 0828   HDL 23 (L) 06/28/2018 0603   HDL 35 (L) 01/17/2018 0906   CHOLHDL 4.2 06/28/2018 0603   VLDL 17 06/28/2018 0603   LDLCALC 56 06/28/2018 0603   LDLCALC 74 01/17/2018 0906   LDLDIRECT 74.7 02/21/2009 0932   LABVLDL 26 01/17/2018 0906   Signed, Terrilee Croak, MD [facility] Pager: [phone] (Secure Chat preferred). 09/07/2019

## 2019-09-08 ENCOUNTER — Inpatient Hospital Stay (HOSPITAL_COMMUNITY): Payer: PPO

## 2019-09-08 LAB — COMPREHENSIVE METABOLIC PANEL
ALT: 37 U/L (ref 0–44)
AST: 35 U/L (ref 15–41)
Albumin: 2.5 g/dL — ABNORMAL LOW (ref 3.5–5.0)
Alkaline Phosphatase: 81 U/L (ref 38–126)
Anion gap: 11 (ref 5–15)
BUN: 43 mg/dL — ABNORMAL HIGH (ref 8–23)
CO2: 30 mmol/L (ref 22–32)
Calcium: 8.9 mg/dL (ref 8.9–10.3)
Chloride: 97 mmol/L — ABNORMAL LOW (ref 98–111)
Creatinine, Ser: 1.11 mg/dL (ref 0.61–1.24)
GFR calc Af Amer: >60 mL/min (ref >60)
GFR calc non Af Amer: >60 mL/min (ref >60)
Glucose, Bld: 167 mg/dL — ABNORMAL HIGH (ref 70–99)
Potassium: 5 mmol/L (ref 3.5–5.1)
Sodium: 138 mmol/L (ref 135–145)
Total Bilirubin: 1.2 mg/dL (ref 0.3–1.2)
Total Protein: 6.6 g/dL (ref 6.5–8.1)

## 2019-09-08 LAB — GLUCOSE, CAPILLARY
Glucose-Capillary: 179 mg/dL — ABNORMAL HIGH (ref 70–99)
Glucose-Capillary: 196 mg/dL — ABNORMAL HIGH (ref 70–99)
Glucose-Capillary: 140 mg/dL — ABNORMAL HIGH (ref 70–99)
Glucose-Capillary: 202 mg/dL — ABNORMAL HIGH (ref 70–99)

## 2019-09-08 LAB — D-DIMER, QUANTITATIVE: D-Dimer, Quant: 2.96 ug/mL-FEU — ABNORMAL HIGH (ref 0.00–0.50)

## 2019-09-08 LAB — CBC
HCT: 36.4 % — ABNORMAL LOW (ref 39.0–52.0)
Hemoglobin: 11.6 g/dL — ABNORMAL LOW (ref 13.0–17.0)
MCH: 30.2 pg (ref 26.0–34.0)
MCHC: 31.9 g/dL (ref 30.0–36.0)
MCV: 94.8 fL (ref 80.0–100.0)
Platelets: 404 10*3/uL — ABNORMAL HIGH (ref 150–400)
RBC: 3.84 MIL/uL — ABNORMAL LOW (ref 4.22–5.81)
RDW: 13.5 % (ref 11.5–15.5)
WBC: 13.8 10*3/uL — ABNORMAL HIGH (ref 4.0–10.5)
nRBC: 0 % (ref 0.0–0.2)

## 2019-09-08 LAB — CK TOTAL AND CKMB (NOT AT ARMC)
CK, MB: 7.7 ng/mL — ABNORMAL HIGH (ref 0.5–5.0)
Relative Index: INVALID (ref 0.0–2.5)
Total CK: 67 U/L (ref 49–397)

## 2019-09-08 LAB — PHOSPHORUS: Phosphorus: 3.3 mg/dL (ref 2.5–4.6)

## 2019-09-08 LAB — LACTIC ACID, PLASMA: Lactic Acid, Venous: 2.5 mmol/L (ref 0.5–1.9)

## 2019-09-08 LAB — MAGNESIUM: Magnesium: 2.3 mg/dL (ref 1.7–2.4)

## 2019-09-08 MED ORDER — ROCURONIUM BROMIDE 10 MG/ML (PF) SYRINGE
PREFILLED_SYRINGE | INTRAVENOUS | Status: AC
  Filled 2019-09-08: qty 10

## 2019-09-08 MED ORDER — FENTANYL CITRATE (PF) 100 MCG/2ML IJ SOLN
INTRAMUSCULAR | Status: AC
  Filled 2019-09-08: qty 2

## 2019-09-08 MED ORDER — MIDAZOLAM HCL 2 MG/2ML IJ SOLN
INTRAMUSCULAR | Status: AC
  Filled 2019-09-08: qty 4

## 2019-09-08 MED ORDER — MORPHINE SULFATE (PF) 2 MG/ML IV SOLN
2.0000 mg | INTRAVENOUS | Status: DC | PRN
  Administered 2019-09-08 – 2019-09-10 (×7): 2 mg via INTRAVENOUS
  Filled 2019-09-08 (×9): qty 1

## 2019-09-08 MED ORDER — DIPHENHYDRAMINE HCL 25 MG PO CAPS: 25.0000 mg | ORAL_CAPSULE | Freq: Once | ORAL | Status: DC

## 2019-09-08 MED ORDER — ETOMIDATE 2 MG/ML IV SOLN
INTRAVENOUS | Status: AC
  Filled 2019-09-08: qty 20

## 2019-09-08 NOTE — Progress Notes (Signed)
PROGRESS NOTE  David Cantrell  DOB: Jun 08, 1940  PCP: [name], MD ZHG:[id number]  DOA: 08/18/2019  LOS: 10 days   Chief Complaint  Patient presents with  . Shortness of Breath   Brief narrative: David Cantrell is a 79 y.o. male with PMH of HTN, HLD, CAD, CKD 3, COPD bronchiectasis, prostate cancer. 8/7, started having body aches. 8/13, tested positive for COVID-19 as an outpatient. 8/17, he presented at infusion center for monoclonal antibody but was noted to be tachypneic, tachycardic and hypoxic and was hence transferred to ED.  In the ED, he required to be on 15 L oxygen by nasal cannula CRP 13, lactic acid 3.5, chest x-ray showed bilateral infiltrates He was admitted for Covid pneumonia.  Subjective: Patient was seen and examined this morning.   Feels tired.  Looks tired.  Tachypneic.  On 40 L high flow oxygen.  Labs with this morning showed elevated BUN/creatinine 243/1.1, elevated lactate 2.5. Chest x-ray this morning with progressive infiltrates.  Assessment/Plan: COVID pneumonia Acute respiratory failure with hypoxia  -Presented with hypoxia -COVID test: Covid PCR positive on 8/13 as an outpatient -Chest imaging: Chest x-ray on admission showed bilateral infiltrates -Treatment: Completed 5-day course of IV remdesivir on 8/21. Currently on Solu-Medrol IV 60 mg every 6 hours. He is also receiving baricitinib since 8/17.  Chest x-ray this morning however continues to show worsening infiltrates. -Lasix being dosed daily depending on clinical condition. Last dose on 8/24.  BUN/creatinine ratio significantly elevated.  Patient seems clinically dry. -Supportive care: Vitamin C, Zinc, inhalers, Tylenol, Antitussives (benzonatate/ Mucinex/Tussionex)  -Protonix while on steroids.  -Patient's respiratory status does not seem improving.  He may require BiPAP or intubation.  CCM involved.  Lab Results  Component Value Date   SARSCOV2NAA POSITIVE (A) 09/10/2019   [place]  NEGATIVE 03/11/2019   [place] Not Detected 02/03/2019   SARSCOV2NAA NOT DETECTED 06/24/2018    Recent Labs  Lab 09/02/19 0224 09/02/19 0224 09/03/19 0225 09/03/19 0225 09/04/19 0201 09/05/19 0242 09/06/19 0208 09/07/19 0213 09/08/19 0252  WBC 7.5   < > 8.5   < > 9.2 9.9 15.8* 14.1* 13.8*  LATICACIDVEN  --   --   --   --   --   --   --   --  2.5*  DDIMER >20.00*   < > 11.78*   < > 11.58* 3.88* 3.68* 3.36* 2.96*  CRP 5.1*  --  4.4*  --   --   --   --   --   --   ALT 65*  --  53*  --   --   --   --   --  37   < > = values in this interval not displayed.   The treatment plan and use of medications and known side effects were discussed with patient/family. Some of the medications used are based on case reports/anecdotal data.  All other medications being used in the management of COVID-19 based on limited study data.  Complete risks and long-term side effects are unknown, however in the best clinical judgment they seem to be of some benefit.  Patient wanted to proceed with treatment options provided.  Pneumothorax/pneumomediastinum -Chest x-ray obtained on 8/24 showed evidence for pneumomediastinum and possibly small left apical pneumothorax.   -Pulmonary consulted.  Subsequent chest x-rays have been stable. -Patient does not require any chest tube at this time.  Acute bilateral lower extremity DVT Presumed pulmonary embolism -Patient noted to have significantly elevated D-dimer.   -  Lower extremity Doppler study showed acute DVT in both legs.  -Patient did have mild hemoptysis.  He likely has acute PE as well. Could not do CT angiogram due to elevated creatinine as well as order tenuous respiratory status.   -Currently patient remains on therapeutic Lovenox.  -Echocardiogram shows mildly reduced RV function with increase in size.  Left ventricular function is normal. Patient is stable.  No signs of bleeding..  Hemoglobin stable. Recent Labs    08/30/19 0442 08/31/19 0509  09/01/19 0215 09/02/19 0224 09/03/19 0225 09/04/19 0201 09/05/19 0242 09/06/19 0208 09/07/19 0213 09/08/19 0252  HGB 10.2* 10.5* 10.8* 11.3* 11.1* 12.2* 12.1* 12.1* 12.2* 11.6*   Chest pain Based on history appears to be pleuritic.  Probably related to Covid pneumonia, pneumothorax, PE.  Also has history of bronchiectasis and CAD.  However, troponin is normal and EKG did not show any ischemic   Chronic kidney disease stage IIIa Renal function stable for the most part. Continue to monitor urine output. Monitor creatinine while he is getting diuresed. Recent Labs    08/30/19 0442 08/31/19 0509 09/01/19 0215 09/02/19 0224 09/03/19 0225 09/04/19 0201 09/05/19 0242 09/06/19 0208 09/07/19 0213 09/08/19 0252  CREATININE 1.27* 1.46* 1.38* 1.21 1.16 1.37* 1.14 1.03 1.02 1.11   History of COPD and bronchiectasis Continue bronchodilators.  Essential hypertension Blood pressure is reasonably well controlled. Continue to monitor. Not on any antihypertensives at home.  History of prostate cancer Stable.  History of thoracic aortic aneurysm Stable.  Mobility: Encourage ambulation once oxygen requirement improves Code Status:   Code Status: Full Code  Nutritional status: Body mass index is 24.8 kg/m.     Diet Order            Diet regular Room service appropriate? Yes; Fluid consistency: Thin  Diet effective now                 DVT prophylaxis:  full dose Lovenox subcu  Antimicrobials:  None  Fluid: None  Consultants: PCCM Family Communication: : Updated patient's wife yesterday afternoon.  She got update from RN this morning.  Status is: Inpatient  Remains inpatient appropriate because:Ongoing diagnostic testing needed not appropriate for outpatient work up and IV treatments appropriate due to intensity of illness or inability to take PO   Dispo:  Patient From: Home  Planned Disposition: To be determined  Expected discharge date: To be  determined.  Medically stable for discharge: No    Infusions:    Scheduled Meds: . albuterol  2 puff Inhalation Q6H  . alfuzosin  10 mg Oral Daily  . aspirin EC  81 mg Oral Daily  . baricitinib  4 mg Oral Daily  . brimonidine  1 drop Right Eye BID  . Chlorhexidine Gluconate Cloth  6 each Topical Daily  . dorzolamide  1 drop Right Eye BID  . enoxaparin (LOVENOX) injection  1 mg/kg Subcutaneous Q12H  . feeding supplement (ENSURE ENLIVE)  237 mL Oral TID BM  . fenofibrate  160 mg Oral Daily  . insulin aspart  0-5 Units Subcutaneous QHS  . insulin aspart  0-9 Units Subcutaneous TID WC  . latanoprost  1 drop Both Eyes QHS  . mouth rinse  15 mL Mouth Rinse BID  . methylPREDNISolone (SOLU-MEDROL) injection  60 mg Intravenous Q6H  . pantoprazole (PROTONIX) IV  40 mg Intravenous Daily  . polyethylene glycol  17 g Oral Daily  . rosuvastatin  10 mg Oral Once per day on Mon Thu  .  senna  2 tablet Oral BID    Antimicrobials: Anti-infectives (From admission, onward)   Start     Dose/Rate Route Frequency Ordered Stop   08/30/19 1000  remdesivir 100 mg in sodium chloride 0.9 % 100 mL IVPB       "Followed by" Linked Group Details   100 mg 200 mL/hr over 30 Minutes Intravenous Daily 08/24/2019 1247 09/02/19 0926   08/30/19 1000  remdesivir 100 mg in sodium chloride 0.9 % 100 mL IVPB  Status:  Discontinued       "Followed by" Linked Group Details   100 mg 200 mL/hr over 30 Minutes Intravenous Daily 08/17/2019 1518 08/31/2019 1621   08/26/2019 1600  remdesivir 200 mg in sodium chloride 0.9% 250 mL IVPB  Status:  Discontinued       "Followed by" Linked Group Details   200 mg 580 mL/hr over 30 Minutes Intravenous Once 08/14/2019 1518 08/20/2019 1621   08/23/2019 1400  remdesivir 200 mg in sodium chloride 0.9% 250 mL IVPB       "Followed by" Linked Group Details   200 mg 580 mL/hr over 30 Minutes Intravenous Once 08/23/2019 1247 08/17/2019 1731      PRN meds: chlorpheniramine-HYDROcodone,  guaiFENesin-dextromethorphan, nitroGLYCERIN, phenylephrine, sodium chloride   Objective: Vitals:   09/08/19 1100 09/08/19 1110  BP: (!) 145/73   Pulse: (!) 101 99  Resp: 20 20  Temp:    SpO2: (!) 84% (!) 78%    Intake/Output Summary (Last 24 hours) at 09/08/2019 1137 Last data filed at 09/08/2019 0200 Gross per 24 hour  Intake 750 ml  Output 1625 ml  Net -875 ml   Filed Weights   09/06/19 0600 09/07/19 0409 09/08/19 0214  Weight: 89.2 kg 89.8 kg 92.4 kg   Weight change: 2.6 kg Body mass index is 24.8 kg/m.   Physical Exam: General exam: Tachypneic, dependent on heated high flow oxygen by nasal cannula.   Skin: No rashes, lesions or ulcers. HEENT: Atraumatic, normocephalic, supple neck, no obvious bleeding Lungs: Labored breathing.  Clear to auscultation bilaterally.  CVS: Regular rate and rhythm, no murmur GI/Abd soft, nontender, nondistended, bowel sound present CNS: Alert, awake, oriented to place and person.  Hard of hearing Psychiatry: Depressed look. Extremities: No pedal edema, no calf tenderness  Data Review: I have personally reviewed the laboratory data and studies available.  Recent Labs  Lab 09/02/19 0224 09/02/19 0224 09/03/19 0225 09/03/19 0225 09/04/19 0201 09/05/19 0242 09/06/19 0208 09/07/19 0213 09/08/19 0252  WBC 7.5   < > 8.5   < > 9.2 9.9 15.8* 14.1* 13.8*  NEUTROABS 6.5  --  7.5  --   --   --   --   --   --   HGB 11.3*   < > 11.1*   < > 12.2* 12.1* 12.1* 12.2* 11.6*  HCT 35.3*   < > 33.8*   < > 36.4* 36.7* 36.5* 37.8* 36.4*  MCV 93.4   < > 93.9   < > 91.9 92.4 92.4 92.9 94.8  PLT 322   < > 335   < > 411* 415* 443* 386 404*   < > = values in this interval not displayed.   Recent Labs  Lab 09/04/19 0201 09/05/19 0242 09/06/19 0208 09/07/19 0213 09/08/19 0252  NA 142 136 138 137 138  K 4.1 4.3 4.1 4.9 5.0  CL 101 98 98 97* 97*  CO2 25 26 27 28 30   GLUCOSE 159* 177* 194* 182* 167*  BUN  53* 53* 49* 49* 43*  CREATININE 1.37* 1.14  1.03 1.02 1.11  CALCIUM 8.9 9.0 9.3 9.1 8.9  MG  --   --   --   --  2.3  PHOS  --   --   --   --  3.3   Lab Results  Component Value Date   HGBA1C 6.7 (H) 09/07/2019       Component Value Date/Time   CHOL 96 06/28/2018 0603   CHOL 135 01/17/2018 0906   TRIG 106 09/05/2019 1003   TRIG 116 01/15/2006 0828   HDL 23 (L) 06/28/2018 0603   HDL 35 (L) 01/17/2018 0906   CHOLHDL 4.2 06/28/2018 0603   VLDL 17 06/28/2018 0603   LDLCALC 56 06/28/2018 0603   LDLCALC 74 01/17/2018 0906   LDLDIRECT 74.7 02/21/2009 0932   LABVLDL 26 01/17/2018 0906   Signed, Terrilee Croak, MD [facility] Pager: [phone] (Secure Chat preferred). 09/08/2019

## 2019-09-08 NOTE — Progress Notes (Signed)
PT Cancellation Note  Patient Details Name: David Cantrell MRN: [id number] DOB: 28-Jun-1940   Cancelled Treatment:    Reason Eval/Treat Not Completed: Medical issues which prohibited therapy - Pt requiring max support on [place], very increased work of breathing and desats at rest. PT to check back when medically appropriate.  [place] Pager [phone]  Office [phone]     [place] 09/08/2019, 1:08 PM

## 2019-09-08 NOTE — Progress Notes (Signed)
Updated wife on phone

## 2019-09-08 NOTE — Consult Note (Signed)
NAME:  David Cantrell, MRN:  [id number], DOB:  [birth year], LOS: 49 ADMISSION DATE:  09/02/2019, CONSULTATION DATE:  09/05/19 REFERRING MD:  Dr Curly Rim, CHIEF COMPLAINT:  resp failure with concern for left apical pneumothorax   Brief History   Concern for left apical ptx and pneumomediastinum in setting of covid  History of present illness    79 year old male,, prior smoker, no hx of covid vaccination. Symptomatic since 08/19/19. S/P positive covid 08/25/19 and admitted 08/18/2019 with acute hypoxemic resp failure. COVID Rx by [facility] MD. On 09/04/19 on HHFNO 50L/min and NRB. Also on [facility] Rx with steroids, oral baricitinib and intermittent lasix and s.p remedesivir. On 09/05/19 routine cxr raised concern for small left apical pneumothorax and left pneumomediastinum and ccm consulted. No overt clinical change  . CCM called 09/05/19   Past Medical History    has a past surgical history that includes ESI; Cystoscopy with retrograde pyelogram, ureteroscopy and stent placement (Right, 02/02/2013); Colonoscopy w/ polypectomy (last one 05-29-2015); Hemorrhoid surgery (07-03-1999); Cardiac catheterization (09-04-2002  DR Wynonia Lawman); Cardiac catheterization (06-05-2010  DR Fidela Juneau); Shoulder open rotator cuff repair (Bilateral, RIGHT X2  LAST ONE 1997/  LEFT 2005); Cystoscopy with retrograde pyelogram, ureteroscopy and stent placement (Right, 02/15/2013); Holmium laser application (Right, 03/20/1827); Video bronchoscopy (Bilateral, 09/24/2014); Prostate biopsy; Cardiovascular stress test (06-01-2013   dr Wynonia Lawman); Radioactive seed implant (N/A, 04/07/2016); Cystoscopy (N/A, 04/07/2016); Colonoscopy; LEFT HEART CATH AND CORONARY ANGIOGRAPHY (N/A, 06/28/2018); Polypectomy; and Anterior cervical decomp/discectomy fusion (N/A, 03/15/2019).   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[place]), Arthritis, Bronchiectasis ([place]), CAD (coronary artery disease), Cancer ([place]), Cataract, Centrilobular emphysema ([place]), Congenital renal cyst, COPD (chronic obstructive pulmonary disease) ([place]), Diverticulosis of colon, Dyspnea, Emphysema of lung ([place]), First degree heart block, Hemorrhoids, History of adenomatous polyp of colon, History of basal cell carcinoma excision, History of external beam radiation therapy (02-01-2016  to 03-16-2016), History of kidney stones, Hyperlipidemia, Hypertension, Macular degeneration, Multiple pulmonary nodules, Open-angle glaucoma, Pneumonia, Prostate cancer [facility]) (urologist-  dr eskridge/  oncologist-  dr Tammi Klippel), Seasonal allergies, Thoracic aortic aneurysm ([place]), and Wears dentures.   reports that he quit smoking about 49 years ago. His smoking use included cigarettes. He has a 6.00 pack-year smoking history. He has never used smokeless tobacco.  Significant Hospital Events   09/09/2019 - admit 8/24 - ccm consult 8/26 -  8/26 - cxr with  tiny apical ptx per report  (not sure by this MD) and pneumomediastinum (Agree) - both stable. LFT better., Creat stable. Sat on chair yesterday. Overall still on 50L HHFNC without improvement. He feels slow fatigue. He is on Rx dose lovenox   Consults:  8/24 - ccm consult  Procedures:  x  Significant Diagnostic Tests:  x  Micro Data:   8/17 - covid - positive 8/17 - blood culture - neg so far 8/19 - MRSA PCR - neg     Antimicrobials:   Anti-covid Olumiant 8/17 (x 14 days) Remdesivir 8/17  Solumedrol  Rx dose lovenox (DVT)   Interim history/subjective:    8/27 -  progressive resp failure - after back and forth much discussion with wife and wife with him and RN via video and later MD - DNR, DNI but full medical care.  Wife is aware that next step is progress towards comfort if he continues to decline but her goal is full medical Rx of the reversible -> then went and spoke to patient -> he cofnirmed DNI, No CPR. In fact he stated to me that he wants to go route of comfort care starting anytime now  He has developed left sided subcut air -> when I asked him about chest tubes of pneumothorax -> he could not comprehend  Objective   Blood pressure (!) 145/73, pulse 96, temperature 97.6 F (36.4 C), temperature source Axillary, resp. rate 16, height 6\' 4"  (1.93 m), weight 92.4 kg, SpO2 95 %.    FiO2 (%):  [80 %-100 %] 100 %   Intake/Output Summary (Last 24 hours) at 09/08/2019 1452 Last data filed at 09/08/2019 0200 Gross per 24 hour  Intake 200 ml  Output 1625 ml  Net -1425 ml   Filed Weights   09/06/19 0600 09/07/19 0409 09/08/19 0214  Weight: 89.2 kg 89.8 kg 92.4 kg     General Appearance:  Looks criticall ill. Fatigued and resp dsistrress Head:  Normocephalic, without obvious abnormality, atraumatic Eyes:  PERRL - yes, conjunctiva/corneas - mudd     Ears:  Normal external ear canals, both ears Nose:  G tube - [place] o3 Throat:  ETT TUBE - no , OG tube - no. FACE MASK o2 Neck:  Supple,  No enlargement/tenderness/nodules Lungs: Signfican subctu air left chest, resp distress, paradoxical somewhat, able to talk some sentences Heart:  S1 and S2 normal, no murmur, CVP - no.  Pressors - no Abdomen:  Soft, no masses, no organomegaly Genitalia / Rectal:  Not done Extremities:  Extremities- intact Skin:  ntact in exposed areas . Sacral area - not examined Neurologic:  Sedation - none -> RASS - +1 . Moves all 4s - yes. CAM-ICU - neg . Orientation - x3+          Resolved Hospital Problem list   x  Assessment & Plan:  Acute Hypoxemic Resp Failure to [id number]  Course complicated by concern for left apical pneumothorax and pneumomediastinum - 09/05/19   On 09/08/2019 -worse resp failure with subcut  air. No significant ptx in morning cxr. Wishes are for DNR, DNI and per patient also comfort. Wife stated is in "gods hands and he is knocking on heavens doors"  Plan  - o2 HHFNC and FM - advised concurrent morphine gtt - d/w [facility] -who will take over continued goals of care and get clarity on chest tubes if required or not   His prognosis is very poor. Do not see him surviving this illness . Patient and wife aware  pccm will be availaable  Best practice:  Per [facility]  D/w Dr  Pietro Cassis      ATTESTATION & SIGNATURE   The patient Jamall Strohmeier Allum is critically ill with multiple organ systems failure and requires high complexity decision making for assessment and support, frequent evaluation and titration of therapies, application of advanced monitoring technologies and extensive interpretation of multiple databases.   Critical Care Time devoted to patient care services described in this note is  60  Minutes. This time reflects time of care of this signee Dr Brand Males. This critical care time does not reflect procedure time, or teaching time or supervisory time of  PA/NP/Med student/Med Resident etc but could involve care discussion time     Dr. Brand Males, M.D., [facility]C.P Pulmonary and Critical Care Medicine Staff Physician [place] [facility] Pager: [phone], If no answer or between  15:00h - 7:00h: call [phone]  09/08/2019 3:15 PM    LABS   Results for VONTAE, COURT "TOM" (MRN [id number]) as of 09/07/2019 10:07  Ref. Range 09/03/2019 02:25 09/04/2019 02:01 09/05/2019 02:42 09/06/2019 02:08 09/07/2019 02:13  D-Dimer, Quant Latest Ref Range: 0.00 - 0.50 ug/mL-FEU 11.78 (H) 11.58 (H) 3.88 (H) 3.68 (H) 3.36 (H)   PULMONARY No results for input(s): PHART, PCO2ART, PO2ART, HCO3, TCO2, O2SAT in the last 168 hours.  Invalid input(s): PCO2, PO2  CBC Recent Labs  Lab 09/06/19 0208 09/07/19 0213 09/08/19 0252  HGB 12.1* 12.2*  11.6*  HCT 36.5* 37.8* 36.4*  WBC 15.8* 14.1* 13.8*  PLT 443* 386 404*    COAGULATION Recent Labs  Lab 09/04/19 0759  INR 1.5*    CARDIAC  No results for input(s): TROPONINI in the last 168 hours. No results for input(s): PROBNP in the last 168 hours.   CHEMISTRY Recent Labs  Lab 09/04/19 0201 09/04/19 0201 09/05/19 0242 09/05/19 0242 09/06/19 0208 09/06/19 0208 09/07/19 0213 09/08/19 0252  NA 142  --  136  --  138  --  137 138  K 4.1   < > 4.3   < > 4.1   < > 4.9 5.0  CL 101  --  98  --  98  --  97* 97*  CO2 25  --  26  --  27  --  28 30  GLUCOSE 159*  --  177*  --  194*  --  182* 167*  BUN 53*  --  53*  --  49*  --  49* 43*  CREATININE 1.37*  --  1.14  --  1.03  --  1.02 1.11  CALCIUM 8.9  --  9.0  --  9.3  --  9.1 8.9  MG  --   --   --   --   --   --   --  2.3  PHOS  --   --   --   --   --   --   --  3.3   < > = values in this interval not displayed.   Estimated Creatinine Clearance: 68.4 mL/min (by C-G formula based on SCr of 1.11 mg/dL).   LIVER Recent Labs  Lab 09/02/19 0224 09/03/19 0225 09/04/19 0759 09/08/19 0252  AST 65* 41  --  35  ALT 65* 53*  --  37  ALKPHOS 82 80  --  81  BILITOT 1.0 0.7  --  1.2  PROT 7.1 6.6  --  6.6  ALBUMIN 2.5* 2.4*  --  2.5*  INR  --   --  1.5*  --      INFECTIOUS Recent Labs  Lab 09/08/19 0252  LATICACIDVEN 2.5*     ENDOCRINE CBG (last 3)  Recent Labs    09/07/19 2138 09/08/19 0834 09/08/19 1128  GLUCAP 164* 179* 196*         IMAGING x48h  - image(s) personally visualized  -   highlighted in bold DG Chest 1 View  Result Date: 09/07/2019 CLINICAL DATA:  Pneumothorax.  COVID positive. EXAM: CHEST  1 VIEW COMPARISON:  09/06/2019. FINDINGS: Stable pneumomediastinum. Stable cardiomegaly. Diffuse bilateral pulmonary interstitial infiltrates/edema  again noted. No pleural effusion. Stable small biapical pneumothoraces. Stable bilateral subcutaneous emphysema. No acute bony abnormality identified.  IMPRESSION: 1. Stable pneumomediastinum. Stable small bilateral apical pneumothoraces. Stable bilateral chest wall subcutaneous emphysema. 2.  Stable cardiomegaly. 3. Diffuse bilateral interstitial infiltrates/edema again noted in this known COVID positive patient. No interim change. Electronically Signed   By: Marcello Moores  Register   On: 09/07/2019 06:27   DG CHEST PORT 1 VIEW  Result Date: 09/08/2019 CLINICAL DATA:  COVID. EXAM: PORTABLE CHEST 1 VIEW COMPARISON:  09/07/2019. FINDINGS: Heart size stable. Pneumomediastinum and small bilateral pneumothoraces again noted. Diffuse bilateral interstitial prominence again noted with slight progression from prior exam. No pleural effusion. Bilateral chest wall subcutaneous emphysema again noted. Postsurgical changes right shoulder. IMPRESSION: 1. Pneumomediastinum small bilateral pneumothoraces again noted. Similar findings on prior exam. 2. Diffuse bilateral interstitial infiltrates again noted with slight progression from prior exam. Electronically Signed   By: Marcello Moores  Register   On: 09/08/2019 06:34   DG CHEST PORT 1 VIEW  Result Date: 09/07/2019 CLINICAL DATA:  History of COVID-19 positivity EXAM: PORTABLE CHEST 1 VIEW COMPARISON:  09/07/2019 FINDINGS: Cardiac shadow is stable. Persistent subcutaneous emphysema is noted bilaterally. Stable apical pneumothoraces are seen. Patchy airspace opacities are noted consistent with the given clinical history of COVID-19 positivity. No bony abnormality is seen. Mild pneumomediastinum is noted as well. IMPRESSION: Changes consistent with small bilateral pneumothoraces and pneumomediastinum. Stable opacities consistent with the given clinical history. Electronically Signed   By: Inez Catalina M.D.   On: 09/07/2019 20:39   DG CHEST PORT 1 VIEW  Result Date: 09/06/2019 CLINICAL DATA:  Follow-up pneumothorax EXAM: PORTABLE CHEST 1 VIEW COMPARISON:  Film from earlier in the same day. FINDINGS: Cardiac shadow is stable. Small  biapical pneumothoraces are again seen and stable. Subcutaneous emphysema is noted. Diffuse airspace opacities are again identified bilaterally worst in the bases stable from the recent exam. Postsurgical changes in the cervical spine are noted. IMPRESSION: Stable airspace opacities bilaterally. Stable biapical pneumothoraces. Electronically Signed   By: Inez Catalina M.D.   On: 09/06/2019 20:23

## 2019-09-08 NOTE — Progress Notes (Signed)
ANTICOAGULATION CONSULT NOTE - Follow Up Consult  Pharmacy Consult for Lovenox Indication: DVT, suspected PE  Allergies  Allergen Reactions  . Aspirin   . Doxycycline Other (See Comments)    Dizziness   . Fish Oil   . Lipitor [Atorvastatin] Other (See Comments)    MUSCLE ACHES  . Morphine And Related Other (See Comments)    Severe headache  . Niacin And Related Other (See Comments)    FLUSHING  . Ramipril Cough  . Sulfa Antibiotics Hives  . Codeine Other (See Comments)    CONSTIPATION    Patient Measurements: Height: 6\' 4"  (193 cm) Weight: 92.4 kg (203 lb 11.3 oz) IBW/kg (Calculated) : 86.8  Vital Signs: Temp: 97.6 F (36.4 C) (08/27 0900) Temp Source: Axillary (08/27 0900) BP: 145/73 (08/27 1100) Pulse Rate: 99 (08/27 1110)  Labs: Recent Labs    09/06/19 0208 09/06/19 0208 09/07/19 0213 09/08/19 0252  HGB 12.1*   < > 12.2* 11.6*  HCT 36.5*  --  37.8* 36.4*  PLT 443*  --  386 404*  CREATININE 1.03  --  1.02 1.11  CKTOTAL  --   --   --  67  CKMB  --   --   --  7.7*   < > = values in this interval not displayed.    Estimated Creatinine Clearance: 68.4 mL/min (by C-G formula based on SCr of 1.11 mg/dL).   Assessment: 5 yoM admitted with COVID-19 pneumonia.  Initially started on Lovenox for VTE prophylaxis.  High suspicion for VTE today and pharmacy is consulted to dose Lovenox.  Dopplers +DVTs bilaterally, presumed PE.   SCr improved 1.11, near baseline with CrCl ~ 68 ml/min  CBC: Hgb decreased to 11.6, Plt remain elevated  No bleeding or complications reported  D-dimer decreasing ( 1.23, increased to > 20, down 11.58 > 3.88 > 3.68> 3.36 > 2.96)  Goal of Therapy:  Anti-Xa level 0.6-1 units/ml 4hrs after LMWH dose given Monitor platelets by anticoagulation protocol: Yes   Plan:  Continue Lovenox 1 mg/kg (100mg ) [place] q12h Follow up renal function, CBC, s/s bleeding.  Gretta Arab PharmD, BCPS Clinical Pharmacist [facility] main pharmacy  [phone] 09/08/2019 12:00 PM

## 2019-09-08 NOTE — Progress Notes (Signed)
CRITICAL VALUE ALERT  Critical Value:  Lactic @ 2.5  Date & Time Notied: 0415 09/08/2019  Provider Notified: [facility] paged   Orders Received/Actions taken: [facility] paged; RN awaiting orders

## 2019-09-08 NOTE — Progress Notes (Signed)
Patients work of breathing increasing and had to increase to max support on [place].  MD aware.

## 2019-09-08 NOTE — Progress Notes (Signed)
Assisted tele visit to patient with wife.  Armonte Tortorella eLink RN

## 2019-09-09 LAB — GLUCOSE, CAPILLARY
Glucose-Capillary: 155 mg/dL — ABNORMAL HIGH (ref 70–99)
Glucose-Capillary: 142 mg/dL — ABNORMAL HIGH (ref 70–99)
Glucose-Capillary: 187 mg/dL — ABNORMAL HIGH (ref 70–99)
Glucose-Capillary: 230 mg/dL — ABNORMAL HIGH (ref 70–99)

## 2019-09-09 LAB — PHOSPHORUS: Phosphorus: 3.1 mg/dL (ref 2.5–4.6)

## 2019-09-09 MED ORDER — LORAZEPAM 2 MG/ML IJ SOLN: 0.5000 mg | Freq: Once | INTRAMUSCULAR | Status: AC

## 2019-09-09 MED ORDER — LORAZEPAM 2 MG/ML IJ SOLN
INTRAMUSCULAR | Status: AC
  Administered 2019-09-09: 0.5 mg
  Filled 2019-09-09: qty 1

## 2019-09-09 NOTE — Progress Notes (Signed)
pts wife video called into room, pts wife also spoke with pt on room phone.

## 2019-09-09 NOTE — Progress Notes (Signed)
Gershon Mussel has been visited by 3 children, and wife - Edd Fabian. Patient and family need more information from MD about the pros and cons of comfort care.

## 2019-09-09 NOTE — Progress Notes (Signed)
Patient preference is not to wear HHFNC at this time. RN aware

## 2019-09-09 NOTE — Progress Notes (Signed)
PROGRESS NOTE  David Cantrell  DOB: May 29, 1940  PCP: [name], MD SHF:[id number]  DOA: 09/07/2019  LOS: 11 days   Chief Complaint  Patient presents with  . Shortness of Breath   Brief narrative: David Cantrell is a 79 y.o. male with PMH of HTN, HLD, CAD, CKD 3, COPD bronchiectasis, prostate cancer. 8/7, started having body aches. 8/13, tested positive for COVID-19 as an outpatient. 8/17, he presented at infusion center for monoclonal antibody but was noted to be tachypneic, tachycardic and hypoxic and was hence transferred to ED.  In the ED, he required to be on 15 L oxygen by nasal cannula CRP 13, lactic acid 3.5, chest x-ray showed bilateral infiltrates He was admitted for Covid pneumonia.  Subjective: Patient was seen and examined this morning.   Looks very tired.  On high flow oxygen at 60 L/min. Per RN, earlier he tried to take the oxygen off and wanted to be comfortable only.  He put it back again.  Definitely struggling with making a decision about comfort care.  Assessment/Plan: COVID pneumonia Acute respiratory failure with hypoxia  -Presented with hypoxia -COVID test: Covid PCR positive on 8/13 as an outpatient -Chest imaging: Chest x-ray on admission showed bilateral infiltrates -Treatment: Completed 5-day course of IV remdesivir on 8/21. Currently on Solu-Medrol IV 60 mg every 6 hours. He is also receiving baricitinib since 8/17.  Chest x-ray this morning however continues to show worsening infiltrates. -Lasix being dosed daily depending on clinical condition. Last dose on 8/24.  BUN/creatinine ratio significantly elevated.  Patient seems clinically dry. -Supportive care: Vitamin C, Zinc, inhalers, Tylenol, Antitussives (benzonatate/ Mucinex/Tussionex)  -Protonix while on steroids.  -Patient's respiratory status is not improving despite maximal medical treatment.  Patient and family have decided a DNR/DNI status.  Patient however wants to hang onto using oxygen as  long as he can tolerate.  If he wants, it would be reasonable to make him comfort care on low-flow oxygen.  Pneumothorax/pneumomediastinum -Chest x-ray obtained on 8/24 showed evidence for pneumomediastinum and possibly small left apical pneumothorax.   -Pulmonary consulted.  Subsequent chest x-rays have been stable. -Patient does not require any chest tube at this time. -Patient and family would not consider chest tube either.  Acute bilateral lower extremity DVT Presumed pulmonary embolism -Patient noted to have significantly elevated D-dimer.   -Lower extremity Doppler study showed acute DVT in both legs.  -Patient did have mild hemoptysis.  He likely has acute PE as well. Could not do CT angiogram due to elevated creatinine as well as order tenuous respiratory status.   -Currently patient remains on therapeutic Lovenox.  -Echocardiogram shows mildly reduced RV function with increase in size.  Left ventricular function is normal. Patient is stable.  No signs of bleeding..  Hemoglobin stable. Recent Labs    08/30/19 0442 08/31/19 0509 09/01/19 0215 09/02/19 0224 09/03/19 0225 09/04/19 0201 09/05/19 0242 09/06/19 0208 09/07/19 0213 09/08/19 0252  HGB 10.2* 10.5* 10.8* 11.3* 11.1* 12.2* 12.1* 12.1* 12.2* 11.6*   Chest pain Based on history appears to be pleuritic.  Probably related to Covid pneumonia, pneumothorax, PE.  Also has history of bronchiectasis and CAD.  However, troponin is normal and EKG did not show any ischemic   Chronic kidney disease stage IIIa Renal function stable for the most part. Continue to monitor urine output. Monitor creatinine while he is getting diuresed. Recent Labs    08/30/19 0442 08/31/19 0509 09/01/19 0215 09/02/19 0224 09/03/19 0225 09/04/19 0201 09/05/19  1517 09/06/19 0208 09/07/19 0213 09/08/19 0252  CREATININE 1.27* 1.46* 1.38* 1.21 1.16 1.37* 1.14 1.03 1.02 1.11   History of COPD and bronchiectasis Continue  bronchodilators.  Essential hypertension Blood pressure is reasonably well controlled. Continue to monitor. Not on any antihypertensives at home.  History of prostate cancer Stable.  History of thoracic aortic aneurysm Stable.  Mobility: Encourage ambulation once oxygen requirement improves Code Status:   Code Status: Partial Code  Nutritional status: Body mass index is 24.69 kg/m.     Diet Order    None      DVT prophylaxis:  full dose Lovenox subcu  Antimicrobials:  None  Fluid: None  Consultants: PCCM Family Communication: I called and updated patient's multiple family members this afternoon.  They wants to go by patient's wish.  They clearly understand that if patient decides to stop high flow oxygen, will respect his wish and do so.  Patient and family understand that that would probably lead to significant hypoxia and he may die in a few minutes to hours.   Patient is not Comfort Care status yet but seems hearing that way.  Status is: Inpatient  Remains inpatient appropriate because:Ongoing diagnostic testing needed not appropriate for outpatient work up and IV treatments appropriate due to intensity of illness or inability to take PO  Dispo:  Patient From: Home  Planned Disposition: Unlikely to survive this hospital stay  expected discharge date: To be determined.  Medically stable for discharge: No  Infusions:    Scheduled Meds: . albuterol  2 puff Inhalation Q6H  . alfuzosin  10 mg Oral Daily  . aspirin EC  81 mg Oral Daily  . baricitinib  4 mg Oral Daily  . brimonidine  1 drop Right Eye BID  . Chlorhexidine Gluconate Cloth  6 each Topical Daily  . diphenhydrAMINE  25 mg Oral Once  . dorzolamide  1 drop Right Eye BID  . enoxaparin (LOVENOX) injection  1 mg/kg Subcutaneous Q12H  . feeding supplement (ENSURE ENLIVE)  237 mL Oral TID BM  . fenofibrate  160 mg Oral Daily  . insulin aspart  0-5 Units Subcutaneous QHS  . insulin aspart  0-9 Units  Subcutaneous TID WC  . latanoprost  1 drop Both Eyes QHS  . mouth rinse  15 mL Mouth Rinse BID  . methylPREDNISolone (SOLU-MEDROL) injection  60 mg Intravenous Q6H  . pantoprazole (PROTONIX) IV  40 mg Intravenous Daily  . polyethylene glycol  17 g Oral Daily  . rosuvastatin  10 mg Oral Once per day on Mon Thu  . senna  2 tablet Oral BID    Antimicrobials: Anti-infectives (From admission, onward)   Start     Dose/Rate Route Frequency Ordered Stop   08/30/19 1000  remdesivir 100 mg in sodium chloride 0.9 % 100 mL IVPB       "Followed by" Linked Group Details   100 mg 200 mL/hr over 30 Minutes Intravenous Daily 08/30/2019 1247 09/02/19 0926   08/30/19 1000  remdesivir 100 mg in sodium chloride 0.9 % 100 mL IVPB  Status:  Discontinued       "Followed by" Linked Group Details   100 mg 200 mL/hr over 30 Minutes Intravenous Daily 08/21/2019 1518 08/26/2019 1621   08/14/2019 1600  remdesivir 200 mg in sodium chloride 0.9% 250 mL IVPB  Status:  Discontinued       "Followed by" Linked Group Details   200 mg 580 mL/hr over 30 Minutes Intravenous Once 09/04/2019 1518 08/24/2019  1621   09/08/2019 1400  remdesivir 200 mg in sodium chloride 0.9% 250 mL IVPB       "Followed by" Linked Group Details   200 mg 580 mL/hr over 30 Minutes Intravenous Once 09/04/2019 1247 08/26/2019 1731      PRN meds: chlorpheniramine-HYDROcodone, guaiFENesin-dextromethorphan, morphine injection, nitroGLYCERIN, phenylephrine, sodium chloride   Objective: Vitals:   09/09/19 1300 09/09/19 1400  BP: (!) 163/78 136/77  Pulse: 97 89  Resp: 16 18  Temp:    SpO2: 92% (!) 87%   No intake or output data in the 24 hours ending 09/09/19 1530 Filed Weights   09/07/19 0409 09/08/19 0214 09/09/19 0500  Weight: 89.8 kg 92.4 kg 92 kg   Weight change: -0.4 kg Body mass index is 24.69 kg/m.   Physical Exam: General exam: Tachypneic, dependent on heated high flow oxygen by nasal cannula.   Skin: No rashes, lesions or ulcers. HEENT:  Atraumatic, normocephalic, supple neck, no obvious bleeding Lungs: Labored breathing. Clear to auscultation bilaterally.  CVS: Regular rate and rhythm, no murmur GI/Abd soft, nontender, nondistended, bowel sound present CNS: Barely able to open eyes on verbal command. Psychiatry: Depressed look. Extremities: No pedal edema, no calf tenderness  Data Review: I have personally reviewed the laboratory data and studies available.  Recent Labs  Lab 09/03/19 0225 09/03/19 0225 09/04/19 0201 09/05/19 0242 09/06/19 0208 09/07/19 0213 09/08/19 0252  WBC 8.5   < > 9.2 9.9 15.8* 14.1* 13.8*  NEUTROABS 7.5  --   --   --   --   --   --   HGB 11.1*   < > 12.2* 12.1* 12.1* 12.2* 11.6*  HCT 33.8*   < > 36.4* 36.7* 36.5* 37.8* 36.4*  MCV 93.9   < > 91.9 92.4 92.4 92.9 94.8  PLT 335   < > 411* 415* 443* 386 404*   < > = values in this interval not displayed.   Recent Labs  Lab 09/04/19 0201 09/05/19 0242 09/06/19 0208 09/07/19 0213 09/08/19 0252 09/09/19 0305  NA 142 136 138 137 138  --   K 4.1 4.3 4.1 4.9 5.0  --   CL 101 98 98 97* 97*  --   CO2 25 26 27 28 30   --   GLUCOSE 159* 177* 194* 182* 167*  --   BUN 53* 53* 49* 49* 43*  --   CREATININE 1.37* 1.14 1.03 1.02 1.11  --   CALCIUM 8.9 9.0 9.3 9.1 8.9  --   MG  --   --   --   --  2.3  --   PHOS  --   --   --   --  3.3 3.1   Lab Results  Component Value Date   HGBA1C 6.7 (H) 09/07/2019       Component Value Date/Time   CHOL 96 06/28/2018 0603   CHOL 135 01/17/2018 0906   TRIG 106 08/24/2019 1003   TRIG 116 01/15/2006 0828   HDL 23 (L) 06/28/2018 0603   HDL 35 (L) 01/17/2018 0906   CHOLHDL 4.2 06/28/2018 0603   VLDL 17 06/28/2018 0603   LDLCALC 56 06/28/2018 0603   LDLCALC 74 01/17/2018 0906   LDLDIRECT 74.7 02/21/2009 0932   LABVLDL 26 01/17/2018 0906   Signed, Terrilee Croak, MD [facility] Pager: [phone] (Secure Chat preferred). 09/09/2019

## 2019-09-09 NOTE — Progress Notes (Signed)
eLink Physician-Brief Progress Note Patient Name: David Cantrell DOB: Oct 11, 1940 MRN: [id number]   Date of Service  09/09/2019  HPI/Events of Note  Patient needs something to help him sleep.  eICU Interventions  Ativan 0.5 mg iv x 1 ordered.        [name] 09/09/2019, 12:13 AM

## 2019-09-09 NOTE — Progress Notes (Signed)
[facility] Progress Note Patient Name: David Cantrell DOB: 1940-11-08 MRN: [id number]   Date of Service  09/09/2019  HPI/Events of Note  No indication of agitation when I observed him via camera.  eICU Interventions  No intervention indicated at this time.        Kerry Kass Paetyn Pietrzak 09/09/2019, 3:36 AM

## 2019-09-09 NOTE — Progress Notes (Signed)
Assisted tele visit to patient with daughter.  David Cantrell eLink RN

## 2019-09-09 NOTE — Progress Notes (Signed)
OT Cancellation Note  Patient Details Name: David Cantrell MRN: [id number] DOB: [birth year]   Cancelled Treatment:    Reason Eval/Treat Not Completed: Medical issues which prohibited therapy. Hold therapy today per RN.  Syreeta Figler L Trenika Hudson 09/09/2019, 3:28 PM

## 2019-09-10 DIAGNOSIS — L8990 Pressure ulcer of unspecified site, unspecified stage: Secondary | ICD-10-CM | POA: Insufficient documentation

## 2019-09-10 LAB — BASIC METABOLIC PANEL
Anion gap: 15 (ref 5–15)
BUN: 62 mg/dL — ABNORMAL HIGH (ref 8–23)
CO2: 28 mmol/L (ref 22–32)
Calcium: 9.2 mg/dL (ref 8.9–10.3)
Chloride: 99 mmol/L (ref 98–111)
Creatinine, Ser: 1.03 mg/dL (ref 0.61–1.24)
GFR calc Af Amer: >60 mL/min (ref >60)
GFR calc non Af Amer: >60 mL/min (ref >60)
Glucose, Bld: 168 mg/dL — ABNORMAL HIGH (ref 70–99)
Potassium: 4.3 mmol/L (ref 3.5–5.1)
Sodium: 142 mmol/L (ref 135–145)

## 2019-09-10 LAB — GLUCOSE, CAPILLARY
Glucose-Capillary: 167 mg/dL — ABNORMAL HIGH (ref 70–99)
Glucose-Capillary: 196 mg/dL — ABNORMAL HIGH (ref 70–99)
Glucose-Capillary: 155 mg/dL — ABNORMAL HIGH (ref 70–99)

## 2019-09-10 LAB — PHOSPHORUS: Phosphorus: 3.5 mg/dL (ref 2.5–4.6)

## 2019-09-10 MED ORDER — MORPHINE SULFATE (PF) 2 MG/ML IV SOLN
2.0000 mg | INTRAVENOUS | Status: DC | PRN
  Administered 2019-09-10 – 2019-09-11 (×7): 2 mg via INTRAVENOUS
  Filled 2019-09-10 (×7): qty 1

## 2019-09-10 MED ORDER — HALOPERIDOL LACTATE 5 MG/ML IJ SOLN
0.5000 mg | INTRAMUSCULAR | Status: DC | PRN
  Administered 2019-09-10: 0.5 mg via INTRAVENOUS
  Filled 2019-09-10: qty 1

## 2019-09-10 MED ORDER — HALOPERIDOL 1 MG PO TABS: 0.5000 mg | ORAL_TABLET | ORAL | Status: DC | PRN

## 2019-09-10 MED ORDER — LORAZEPAM 2 MG/ML IJ SOLN
1.0000 mg | INTRAMUSCULAR | Status: DC | PRN
  Administered 2019-09-10 – 2019-09-11 (×3): 1 mg via INTRAVENOUS
  Filled 2019-09-10 (×3): qty 1

## 2019-09-10 MED ORDER — MELATONIN 3 MG PO TABS: 3.0000 mg | ORAL_TABLET | Freq: Every day | ORAL | Status: AC

## 2019-09-10 MED ORDER — LORAZEPAM 2 MG/ML PO CONC: 1.0000 mg | ORAL | Status: DC | PRN

## 2019-09-10 MED ORDER — MORPHINE SULFATE (PF) 2 MG/ML IV SOLN
2.0000 mg | INTRAVENOUS | Status: DC | PRN
  Administered 2019-09-10 (×2): 2 mg via INTRAVENOUS
  Filled 2019-09-10: qty 1

## 2019-09-10 MED ORDER — HALOPERIDOL LACTATE 2 MG/ML PO CONC
0.5000 mg | ORAL | Status: DC | PRN
  Filled 2019-09-10: qty 0.3

## 2019-09-10 MED ORDER — LORAZEPAM 1 MG PO TABS: 1.0000 mg | ORAL_TABLET | ORAL | Status: DC | PRN

## 2019-09-10 MED ORDER — MELATONIN 3 MG PO TABS: 3.0000 mg | ORAL_TABLET | Freq: Every day | ORAL | Status: DC

## 2019-09-10 NOTE — Plan of Care (Signed)
  Problem: Health Behavior/Discharge Planning: Goal: Ability to manage health-related needs will improve Outcome: Progressing   Problem: Clinical Measurements: Goal: Ability to maintain clinical measurements within normal limits will improve Outcome: Progressing Goal: Will remain free from infection Outcome: Progressing Goal: Diagnostic test results will improve Outcome: Progressing Goal: Respiratory complications will improve Outcome: Progressing Goal: Cardiovascular complication will be avoided Outcome: Progressing   Problem: Activity: Goal: Risk for activity intolerance will decrease Outcome: Progressing   Problem: Nutrition: Goal: Adequate nutrition will be maintained Outcome: Progressing   Problem: Coping: Goal: Level of anxiety will decrease Outcome: Progressing   Problem: Elimination: Goal: Will not experience complications related to bowel motility Outcome: Progressing   Problem: Pain Managment: Goal: General experience of comfort will improve Outcome: Progressing   Problem: Safety: Goal: Ability to remain free from injury will improve Outcome: Progressing   Problem: Skin Integrity: Goal: Risk for impaired skin integrity will decrease Outcome: Progressing   Problem: Coping: Goal: Psychosocial and spiritual needs will be supported Outcome: Progressing   Problem: Respiratory: Goal: Will maintain a patent airway Outcome: Progressing Goal: Complications related to the disease process, condition or treatment will be avoided or minimized Outcome: Progressing   Problem: Activity: Goal: Ability to tolerate increased activity will improve Outcome: Progressing   Problem: Clinical Measurements: Goal: Ability to maintain a body temperature in the normal range will improve Outcome: Progressing   Problem: Respiratory: Goal: Ability to maintain adequate ventilation will improve Outcome: Progressing Goal: Ability to maintain a clear airway will  improve Outcome: Progressing

## 2019-09-10 NOTE — Progress Notes (Signed)
MD spoke to family, pt changed to comfort measures, pt stated he wanted to be comfortable and that he understands that means end of life. 3 Family members at bedside, [name], [name], and [name]

## 2019-09-10 NOTE — Significant Event (Signed)
Patient is very uncomfortable today. Despite adequate oxygen supply, he is feeling air hunger.  He is consistently asking to make him more comfortable. In my conversation this morning, patient said, ' I am not sure how long I can hang onto this.  I am very uncomfortable.'  I reassured him that if he changes his mind to be comfortable only and stop fighting, we will respect his wish.  He verbally acknowledged to me that it would mean he would die not long after oxygen is held.   In the afternoon, patient is asking for more and more morphine.  He states 'I can take this any longer, let me go.'   I had a long conversation with patient's family twice this afternoon. I conveyed patient's wish to them.  Family agrees to respect his wish. We will change the patient to comfort care status. Family members are coming to visit him. Once they visit him, I have instructed the nurse to slow down on high flow oxygen and use PRN morphine and as needed Ativan.

## 2019-09-10 NOTE — Progress Notes (Signed)
eLink Physician-Brief Progress Note Patient Name: David Cantrell DOB: [birth year] MRN: [id number]   Date of Service  09/10/2019  HPI/Events of Note  Patient needs something for sleep.  eICU Interventions  Melatonin 3 mg po Hs ordered for sleep.        Greenley Martone U Winston Misner 09/10/2019, 1:05 AM

## 2019-09-10 NOTE — Progress Notes (Signed)
PROGRESS NOTE  David Cantrell  DOB: 03/02/1940  PCP: [name], MD ASN:[id number]  DOA: 08/26/2019  LOS: 12 days   Chief Complaint  Patient presents with  . Shortness of Breath   Brief narrative: David Cantrell is a 79 y.o. male with PMH of HTN, HLD, CAD, CKD 3, COPD bronchiectasis, prostate cancer. 8/7, started having body aches. 8/13, tested positive for COVID-19 as an outpatient. 8/17, he presented at infusion center for monoclonal antibody but was noted to be tachypneic, tachycardic and hypoxic and was hence transferred to ED.  In the ED, he required to be on 15 L oxygen by nasal cannula CRP 13, lactic acid 3.5, chest x-ray showed bilateral infiltrates He was admitted for Covid pneumonia.  Subjective: Patient was seen and examined this morning.   On 100% nonrebreather mask, attached to high flow oxygen. Able to open eyes and carry simple conversation. He says his mouth is dry.  He is not sure how long he can hang onto high flow oxygen but does not want to stop it at this time.  Assessment/Plan: COVID pneumonia Acute respiratory failure with hypoxia  -Presented with hypoxia -COVID test: Covid PCR positive on 8/13 as an outpatient -Chest imaging: Chest x-ray on admission showed bilateral infiltrates -Treatment: Completed 5-day course of IV remdesivir on 8/21. Currently on Solu-Medrol IV 60 mg every 6 hours. He is also receiving baricitinib since 8/17, a maximum 14 days.   -Currently on 100% nonrebreather mask, attached to high flow oxygen.  -Progression: Not much of improvement, remains on high flow oxygen.  He also has underlying pneumothorax which actually is a poor prognostic factor for Covid mortality. -Last chest x-ray from 8/27 did not show any change in airspace opacities.  We will repeat chest x-ray tomorrow. -Continue high-dose steroids, baricitinib. -Too try to receive any more Lasix. --Supportive care: Vitamin C, Zinc, inhalers, Tylenol, Antitussives  (benzonatate/ Mucinex/Tussionex)  -Protonix while on steroids.  I have had multiple conversations with the patient and family.  He is a DNR/DNI status Patient however wants to hang onto using oxygen as long as he can tolerate.  If he wants, it would be reasonable to make him comfort care on low-flow oxygen.  Family agrees to continue with patient's wishes.  Pneumothorax/pneumomediastinum -Chest x-ray obtained on 8/24 showed evidence for pneumomediastinum and possibly small left apical pneumothorax.   -Pulmonary consulted.  Subsequent chest x-rays have been stable. -Patient does not require any chest tube at this time. -Patient and family would not consider chest tube either. -Repeat chest x-ray tomorrow.  Acute bilateral lower extremity DVT Presumed pulmonary embolism -Patient noted to have significantly elevated D-dimer.   -Lower extremity Doppler study showed acute DVT in both legs.  -Patient did have mild hemoptysis.  He likely has acute PE as well. Could not do CT angiogram due to elevated creatinine as well as order tenuous respiratory status.   -Currently patient remains on therapeutic Lovenox.  -Echocardiogram shows mildly reduced RV function with increase in size.  Left ventricular function is normal. Patient is stable.  No signs of bleeding..  Hemoglobin stable. Recent Labs    08/30/19 0442 08/31/19 0509 09/01/19 0215 09/02/19 0224 09/03/19 0225 09/04/19 0201 09/05/19 0242 09/06/19 0208 09/07/19 0213 09/08/19 0252  HGB 10.2* 10.5* 10.8* 11.3* 11.1* 12.2* 12.1* 12.1* 12.2* 11.6*   Chest pain Based on history appears to be pleuritic.  Probably related to Covid pneumonia, pneumothorax, PE.  Also has history of bronchiectasis and CAD.  However, troponin  is normal and EKG did not show any ischemic   Chronic kidney disease stage IIIa Renal function stable for the most part.  Recent Labs    08/31/19 0509 09/01/19 0215 09/02/19 0224 09/03/19 0225 09/04/19 0201  09/05/19 0242 09/06/19 0208 09/07/19 0213 09/08/19 0252 09/10/19 0205  CREATININE 1.46* 1.38* 1.21 1.16 1.37* 1.14 1.03 1.02 1.11 1.03   History of COPD and bronchiectasis Continue bronchodilators.  Essential hypertension Blood pressure is reasonably well controlled. Continue to monitor. Not on any antihypertensives at home.  History of prostate cancer Stable.  History of thoracic aortic aneurysm Stable.  Mobility: Encourage ambulation once oxygen requirement improves Code Status:   Code Status: Partial Code  Nutritional status: Body mass index is 24.69 kg/m.     Diet Order    None      DVT prophylaxis:  full dose Lovenox subcu  Antimicrobials:  None  Fluid: None  Consultants: PCCM Family Communication: : Updated patient's family today again.  Status is: Inpatient  Remains inpatient appropriate because:Ongoing diagnostic testing needed not appropriate for outpatient work up and IV treatments appropriate due to intensity of illness or inability to take PO  Dispo:  Patient From: Home  Planned Disposition: Unlikely to survive this hospital stay  expected discharge date: To be determined.  Medically stable for discharge: No  Infusions:    Scheduled Meds: . albuterol  2 puff Inhalation Q6H  . alfuzosin  10 mg Oral Daily  . aspirin EC  81 mg Oral Daily  . baricitinib  4 mg Oral Daily  . brimonidine  1 drop Right Eye BID  . Chlorhexidine Gluconate Cloth  6 each Topical Daily  . dorzolamide  1 drop Right Eye BID  . enoxaparin (LOVENOX) injection  1 mg/kg Subcutaneous Q12H  . feeding supplement (ENSURE ENLIVE)  237 mL Oral TID BM  . fenofibrate  160 mg Oral Daily  . insulin aspart  0-5 Units Subcutaneous QHS  . insulin aspart  0-9 Units Subcutaneous TID WC  . latanoprost  1 drop Both Eyes QHS  . mouth rinse  15 mL Mouth Rinse BID  . melatonin  3 mg Oral QHS  . methylPREDNISolone (SOLU-MEDROL) injection  60 mg Intravenous Q6H  . pantoprazole (PROTONIX)  IV  40 mg Intravenous Daily  . polyethylene glycol  17 g Oral Daily  . rosuvastatin  10 mg Oral Once per day on Mon Thu  . senna  2 tablet Oral BID    Antimicrobials: Anti-infectives (From admission, onward)   Start     Dose/Rate Route Frequency Ordered Stop   08/30/19 1000  remdesivir 100 mg in sodium chloride 0.9 % 100 mL IVPB       "Followed by" Linked Group Details   100 mg 200 mL/hr over 30 Minutes Intravenous Daily 09/09/2019 1247 09/02/19 0926   08/30/19 1000  remdesivir 100 mg in sodium chloride 0.9 % 100 mL IVPB  Status:  Discontinued       "Followed by" Linked Group Details   100 mg 200 mL/hr over 30 Minutes Intravenous Daily 08/14/2019 1518 08/25/2019 1621   08/24/2019 1600  remdesivir 200 mg in sodium chloride 0.9% 250 mL IVPB  Status:  Discontinued       "Followed by" Linked Group Details   200 mg 580 mL/hr over 30 Minutes Intravenous Once 09/12/2019 1518 08/23/2019 1621   09/04/2019 1400  remdesivir 200 mg in sodium chloride 0.9% 250 mL IVPB       "Followed by" Linked Group Details  200 mg 580 mL/hr over 30 Minutes Intravenous Once 08/13/2019 1247 08/13/2019 1731      PRN meds: chlorpheniramine-HYDROcodone, guaiFENesin-dextromethorphan, morphine injection, nitroGLYCERIN, phenylephrine, sodium chloride   Objective: Vitals:   09/10/19 1113 09/10/19 1200  BP:  (!) 171/91  Pulse: (!) 101 (!) 104  Resp: (!) 24 (!) 25  Temp:    SpO2: 90% (!) 85%    Intake/Output Summary (Last 24 hours) at 09/10/2019 1327 Last data filed at 09/10/2019 0800 Gross per 24 hour  Intake 1280 ml  Output 475 ml  Net 805 ml   Filed Weights   09/07/19 0409 09/08/19 0214 09/09/19 0500  Weight: 89.8 kg 92.4 kg 92 kg   Weight change:  Body mass index is 24.69 kg/m.   Physical Exam: General exam: Weak.  Tachypneic, dependent on heated high flow oxygen by nasal cannula.   Skin: No rashes, lesions or ulcers. HEENT: Atraumatic, normocephalic, supple neck, no obvious bleeding Lungs: Labored breathing.  Clear to auscultation bilaterally.  CVS: Regular rate and rhythm, no murmur GI/Abd soft, nontender, nondistended, bowel sound present CNS: Barely able to open eyes on verbal command. Psychiatry: Depressed look. Extremities: No pedal edema, no calf tenderness  Data Review: I have personally reviewed the laboratory data and studies available.  Recent Labs  Lab 09/04/19 0201 09/05/19 0242 09/06/19 0208 09/07/19 0213 09/08/19 0252  WBC 9.2 9.9 15.8* 14.1* 13.8*  HGB 12.2* 12.1* 12.1* 12.2* 11.6*  HCT 36.4* 36.7* 36.5* 37.8* 36.4*  MCV 91.9 92.4 92.4 92.9 94.8  PLT 411* 415* 443* 386 404*   Recent Labs  Lab 09/05/19 0242 09/06/19 0208 09/07/19 0213 09/08/19 0252 09/09/19 0305 09/10/19 0205  NA 136 138 137 138  --  142  K 4.3 4.1 4.9 5.0  --  4.3  CL 98 98 97* 97*  --  99  CO2 26 27 28 30   --  28  GLUCOSE 177* 194* 182* 167*  --  168*  BUN 53* 49* 49* 43*  --  62*  CREATININE 1.14 1.03 1.02 1.11  --  1.03  CALCIUM 9.0 9.3 9.1 8.9  --  9.2  MG  --   --   --  2.3  --   --   PHOS  --   --   --  3.3 3.1 3.5   Lab Results  Component Value Date   HGBA1C 6.7 (H) 09/07/2019       Component Value Date/Time   CHOL 96 06/28/2018 0603   CHOL 135 01/17/2018 0906   TRIG 106 08/26/2019 1003   TRIG 116 01/15/2006 0828   HDL 23 (L) 06/28/2018 0603   HDL 35 (L) 01/17/2018 0906   CHOLHDL 4.2 06/28/2018 0603   VLDL 17 06/28/2018 0603   LDLCALC 56 06/28/2018 0603   LDLCALC 74 01/17/2018 0906   LDLDIRECT 74.7 02/21/2009 0932   LABVLDL 26 01/17/2018 0906   Signed, Terrilee Croak, MD [facility] Pager: [phone] (Secure Chat preferred). 09/10/2019

## 2019-09-11 ENCOUNTER — Inpatient Hospital Stay (HOSPITAL_COMMUNITY): Payer: PPO

## 2019-09-11 MED ORDER — MORPHINE 100MG IN NS 100ML (1MG/ML) PREMIX INFUSION
5.0000 mg/h | INTRAVENOUS | Status: DC
  Administered 2019-09-11: 5 mg/h via INTRAVENOUS
  Filled 2019-09-11: qty 100

## 2019-09-13 NOTE — Progress Notes (Signed)
David Cantrell died at 0608 with his wife and 2 nurses at his side. He had a calm and peaceful death. His heart rate slowed and breathing stopped. He became asystole on the monitor with no heart tones heard by 2 RNs. He was apneic and was pronounced  At 0608. Notified [facility]. His wife took his ring and no other personal belongings were left. His teeth were in his mouth. Placed in body bag for transport.  Signed  Jim Like. Delicia Berens RN Clarene Critchley, RN

## 2019-09-13 NOTE — Progress Notes (Signed)
Pt. Was weaned off [place] by nursing several hours ago.  Will transition to [place].

## 2019-09-13 NOTE — TOC Transition Note (Signed)
Transition of Care [facility]) - CM/SW Discharge Note   Patient Details  Name: David Cantrell MRN: [id number] Date of Birth: May 09, 1940  Transition of Care [facility]) CM/SW Contact:  Leeroy Cha, RN Phone Number: 09-28-19, 9:11 AM   Clinical Narrative:    Patient expired.   Final next level of care: Expired Barriers to Discharge: Barriers Resolved   Patient Goals and CMS Choice Patient states their goals for this hospitalization and ongoing recovery are:: i would like to get better and go home CMS Medicare.gov Compare Post Acute Care list provided to:: Patient    Discharge Placement                       Discharge Plan and Services   Discharge Planning Services: CM Consult                                 Social Determinants of Health (SDOH) Interventions     Readmission Risk Interventions No flowsheet data found.

## 2019-09-13 NOTE — Death Summary Note (Signed)
DEATH SUMMARY   Patient Details  Name: David Cantrell MRN: [id number] DOB: December 19, 1940  Admission/Discharge Information   Admit Date:  09/02/2019  Date of Death: Date of Death: 2019/09/15  Time of Death: Time of Death: 0608  Length of Stay: 2022/03/29  Referring Physician: [name], MD   Reason(s) for Hospitalization  Covid pneumonia  Diagnoses  Preliminary cause of death:  Secondary Diagnoses (including complications and co-morbidities):  Active Problems:   Essential hypertension   Malignant neoplasm of prostate (HCC)   Thoracic aortic aneurysm (HCC)   CKD (chronic kidney disease) stage 3, GFR 30-59 ml/min   Pneumonia due to COVID-19 virus   Acute respiratory failure with hypoxia (HCC)   Pressure injury of skin   Brief Hospital Course (including significant findings, care, treatment, and services provided and events leading to death)   David Cantrell is a 79 y.o. male with PMH of HTN, HLD, CAD, CKD 3, COPD bronchiectasis, prostate cancer. 8/7, started having body aches. 8/13, tested positive for COVID-19 as an outpatient. 09/02/2022, he presented at infusion center for monoclonal antibody but was noted to be tachypneic, tachycardic and hypoxic and was hence transferred to ED.  In the ED, he required to be on 15 L oxygen by nasal cannula CRP 13, lactic acid 3.5, chest x-ray showed bilateral infiltrates He was admitted for Covid pneumonia.  He underwent maximal medical treatment for Covid pneumonia.  He remained on high flow oxygen for several days.  He sustained pneumothorax as well because of lung injury. His oxygen level did not improve despite maximal medical treatment.  Patient felt increasingly tired and wanted to be comfortable.  I had several long conversation with patient and his multiple family members.  Patient wanted to be left comfortable knowing the outcome of imminent death.  Family decided to respect his wish.  He was given low doses of IV morphine and IV Ativan as needed  at his wish. He expired on 09/15/19 at 06:08 am.  Pertinent Labs and Studies  Significant Diagnostic Studies DG Chest 1 View  Result Date: 09/07/2019 CLINICAL DATA:  Pneumothorax.  COVID positive. EXAM: CHEST  1 VIEW COMPARISON:  09/06/2019. FINDINGS: Stable pneumomediastinum. Stable cardiomegaly. Diffuse bilateral pulmonary interstitial infiltrates/edema again noted. No pleural effusion. Stable small biapical pneumothoraces. Stable bilateral subcutaneous emphysema. No acute bony abnormality identified. IMPRESSION: 1. Stable pneumomediastinum. Stable small bilateral apical pneumothoraces. Stable bilateral chest wall subcutaneous emphysema. 2.  Stable cardiomegaly. 3. Diffuse bilateral interstitial infiltrates/edema again noted in this known COVID positive patient. No interim change. Electronically Signed   By: Marcello Moores  Register   On: 09/07/2019 06:27   DG CHEST PORT 1 VIEW  Result Date: 09/15/19 CLINICAL DATA:  COVID-19. EXAM: PORTABLE CHEST 1 VIEW COMPARISON:  09/08/2019 FINDINGS: 0458 hours. Low volume film. Cardiopericardial silhouette is at upper limits of normal for size. Stable mediastinal prominence. Tiny left apical pneumothorax again noted. Trace pneumomediastinum not excluded. The diffuse interstitial and basilar airspace disease is similar to prior. Bones are diffusely demineralized. Telemetry leads overlie the chest. IMPRESSION: 1. No substantial change in exam. Tiny left apical pneumothorax. 2. Trace pneumomediastinum not excluded. 3. Diffuse interstitial and basilar airspace disease, similar to prior. 4. Decreasing subcutaneous emphysema. Electronically Signed   By: Misty Stanley M.D.   On: September 15, 2019 07:43   DG Chest Port 1 View  Result Date: 09/08/2019 CLINICAL DATA:  Acute hypoxemic respiratory failure. COVID-19 pneumonia. EXAM: PORTABLE CHEST 1 VIEW COMPARISON:  Radiograph earlier this day, additional priors. FINDINGS: Small  bilateral pneumothoraces, unchanged. Pneumomediastinum  unchanged, with large volume of subcutaneous emphysema about both chest walls and supraclavicular soft tissues. Lower lung volumes from prior exam. Patchy bilateral airspace opacities are not significantly changed. Stable heart size and mediastinal contours. IMPRESSION: 1. Unchanged small bilateral pneumothoraces and pneumomediastinum. Stable extensive subcutaneous emphysema. 2. Unchanged patchy bilateral airspace disease consistent with COVID-19 pneumonia. Electronically Signed   By: Keith Rake M.D.   On: 09/08/2019 21:41   DG CHEST PORT 1 VIEW  Result Date: 09/08/2019 CLINICAL DATA:  COVID. EXAM: PORTABLE CHEST 1 VIEW COMPARISON:  09/07/2019. FINDINGS: Heart size stable. Pneumomediastinum and small bilateral pneumothoraces again noted. Diffuse bilateral interstitial prominence again noted with slight progression from prior exam. No pleural effusion. Bilateral chest wall subcutaneous emphysema again noted. Postsurgical changes right shoulder. IMPRESSION: 1. Pneumomediastinum small bilateral pneumothoraces again noted. Similar findings on prior exam. 2. Diffuse bilateral interstitial infiltrates again noted with slight progression from prior exam. Electronically Signed   By: Marcello Moores  Register   On: 09/08/2019 06:34   DG CHEST PORT 1 VIEW  Result Date: 09/07/2019 CLINICAL DATA:  History of COVID-19 positivity EXAM: PORTABLE CHEST 1 VIEW COMPARISON:  09/07/2019 FINDINGS: Cardiac shadow is stable. Persistent subcutaneous emphysema is noted bilaterally. Stable apical pneumothoraces are seen. Patchy airspace opacities are noted consistent with the given clinical history of COVID-19 positivity. No bony abnormality is seen. Mild pneumomediastinum is noted as well. IMPRESSION: Changes consistent with small bilateral pneumothoraces and pneumomediastinum. Stable opacities consistent with the given clinical history. Electronically Signed   By: Inez Catalina M.D.   On: 09/07/2019 20:39   DG CHEST PORT 1  VIEW  Result Date: 09/06/2019 CLINICAL DATA:  Follow-up pneumothorax EXAM: PORTABLE CHEST 1 VIEW COMPARISON:  Film from earlier in the same day. FINDINGS: Cardiac shadow is stable. Small biapical pneumothoraces are again seen and stable. Subcutaneous emphysema is noted. Diffuse airspace opacities are again identified bilaterally worst in the bases stable from the recent exam. Postsurgical changes in the cervical spine are noted. IMPRESSION: Stable airspace opacities bilaterally. Stable biapical pneumothoraces. Electronically Signed   By: Inez Catalina M.D.   On: 09/06/2019 20:23   DG CHEST PORT 1 VIEW  Result Date: 09/06/2019 CLINICAL DATA:  Pneumothorax. EXAM: PORTABLE CHEST 1 VIEW COMPARISON:  Same day. FINDINGS: The heart size and mediastinal contours are within normal limits. Stable minimal biapical pneumothorax is are noted. Stable subcutaneous emphysema is noted bilaterally. Stable pneumomediastinum is noted. Stable bibasilar opacities are noted concerning for edema or atelectasis. The visualized skeletal structures are unremarkable. IMPRESSION: Stable minimal biapical pneumothorax. Stable pneumomediastinum. Stable subcutaneous emphysema. Stable bibasilar opacities are noted concerning for edema or atelectasis. Electronically Signed   By: Marijo Conception M.D.   On: 09/06/2019 08:11   DG CHEST PORT 1 VIEW  Result Date: 09/06/2019 CLINICAL DATA:  79 year old male with positive COVID-19. Follow-up pneumothorax. EXAM: PORTABLE CHEST 1 VIEW COMPARISON:  Earlier radiograph dated 09/05/2019. FINDINGS: Small biapical pneumothorax without significant interval change. Diffuse bilateral pulmonary opacities as seen previously. There is pneumomediastinum and upper thoracic soft tissue emphysema similar to prior radiograph. Stable cardiomediastinal silhouette. No acute osseous pathology. IMPRESSION: 1. No significant interval change in the small biapical pneumothorax. Continued follow-up recommended. 2. Diffuse  bilateral pulmonary opacities as seen previously. Electronically Signed   By: Anner Crete M.D.   On: 09/06/2019 02:08   DG CHEST PORT 1 VIEW  Result Date: 09/05/2019 CLINICAL DATA:  COVID-19 infection. Tachypnea, tachycardia, and hypoxia. EXAM: PORTABLE CHEST 1 VIEW COMPARISON:  09/05/2019 at 4:54 a.m. FINDINGS: The cardiomediastinal silhouette is unchanged. Pneumomediastinum is again noted with subcutaneous emphysema also present in the chest wall and neck bilaterally. A small left apical pneumothorax is unchanged, while there is a new small right apical pneumothorax. Patchy bilateral lung opacities, greatest in the lower lungs, are unchanged. No sizable pleural effusion is identified. IMPRESSION: 1. Small bilateral apical pneumothoraces, new on the right and unchanged on the left. 2. Unchanged bilateral lung opacities consistent with pneumonia. Electronically Signed   By: Logan Bores M.D.   On: 09/05/2019 15:07   DG Chest Port 1 View  Result Date: 09/05/2019 CLINICAL DATA:  Pneumonia, COVID-19 EXAM: PORTABLE CHEST 1 VIEW COMPARISON:  Portable exam 0454 hours compared to 08/30/2019 FINDINGS: Stable heart size and pulmonary vascularity. Pneumomediastinum present, new since previous exam, extending into chest wall and supraclavicular regions. Small LEFT apex pneumothorax. Patchy infiltrates in mid to lower lungs consistent with multifocal pneumonia and COVID-19. No pleural effusion identified. Atherosclerotic calcification aorta. IMPRESSION: Persistent pulmonary infiltrates consistent with multifocal pneumonia. New pneumomediastinum and small LEFT apex pneumothorax. Critical Value/emergent results were called by telephone at the time of interpretation on 09/05/2019 at 0721 hrs to provider Bonnielee Haff MD, who verbally acknowledged these results. Electronically Signed   By: Lavonia Dana M.D.   On: 09/05/2019 07:25   DG CHEST PORT 1 VIEW  Result Date: 08/30/2019 CLINICAL DATA:  79 year old male  positive COVID-19. EXAM: PORTABLE CHEST 1 VIEW COMPARISON:  Portable chest 08/24/2019 and earlier. FINDINGS: Portable AP semi upright view at 1012 hours. Stable lung volumes and mediastinal contours. Widespread coarse bilateral pulmonary interstitial opacity is stable since yesterday. No pneumothorax or pleural effusion. Prior cervical ACDF. Postoperative changes also to the right scapula. No acute osseous abnormality identified. IMPRESSION: Widespread coarse bilateral pulmonary interstitial opacity compatible with COVID-19 pneumonia is stable since yesterday. Electronically Signed   By: Genevie Ann M.D.   On: 08/30/2019 10:40   DG Chest Port 1 View  Result Date: 08/14/2019 CLINICAL DATA:  Hypoxia, COVID positive EXAM: PORTABLE CHEST 1 VIEW COMPARISON:  CT chest dated 06/28/2019 FINDINGS: Multifocal patchy airspace opacities in the lungs bilaterally, lower lung predominant. No pleural effusion or pneumothorax. The heart is normal in size. Cervical spine fixation hardware. IMPRESSION: Multifocal pneumonia in this patient with known COVID, lower lung predominant. Electronically Signed   By: Julian Hy M.D.   On: 09/05/2019 10:31   VAS [place] LOWER EXTREMITY VENOUS (DVT)  Result Date: 08/31/2019  Lower Venous DVTStudy Indications: Swelling.  Risk Factors: COVID 19 positive. Comparison Study: No prior studies. Performing Technologist: Oliver Hum RVT  Examination Guidelines: A complete evaluation includes B-mode imaging, spectral Doppler, color Doppler, and power Doppler as needed of all accessible portions of each vessel. Bilateral testing is considered an integral part of a complete examination. Limited examinations for reoccurring indications may be performed as noted. The reflux portion of the exam is performed with the patient in reverse Trendelenburg.  +---------+---------------+---------+-----------+----------+--------------+ RIGHT    CompressibilityPhasicitySpontaneityPropertiesThrombus Aging  +---------+---------------+---------+-----------+----------+--------------+ CFV      Full           Yes      Yes                                 +---------+---------------+---------+-----------+----------+--------------+ SFJ      Full                                                        +---------+---------------+---------+-----------+----------+--------------+  FV Prox  Full                                                        +---------+---------------+---------+-----------+----------+--------------+ FV Mid   Full                                                        +---------+---------------+---------+-----------+----------+--------------+ FV DistalFull                                                        +---------+---------------+---------+-----------+----------+--------------+ PFV      Full                                                        +---------+---------------+---------+-----------+----------+--------------+ POP      Full           Yes      Yes                                 +---------+---------------+---------+-----------+----------+--------------+ PTV      Partial                                      Acute          +---------+---------------+---------+-----------+----------+--------------+ PERO     None                                         Acute          +---------+---------------+---------+-----------+----------+--------------+   +---------+---------------+---------+-----------+----------+--------------+ LEFT     CompressibilityPhasicitySpontaneityPropertiesThrombus Aging +---------+---------------+---------+-----------+----------+--------------+ CFV      Full           Yes      Yes                                 +---------+---------------+---------+-----------+----------+--------------+ SFJ      Full                                                         +---------+---------------+---------+-----------+----------+--------------+ FV Prox  Full                                                        +---------+---------------+---------+-----------+----------+--------------+  FV Mid   Full                                                        +---------+---------------+---------+-----------+----------+--------------+ FV DistalFull                                                        +---------+---------------+---------+-----------+----------+--------------+ PFV      Full                                                        +---------+---------------+---------+-----------+----------+--------------+ POP      Full           Yes      Yes                                 +---------+---------------+---------+-----------+----------+--------------+ PTV      Partial                                      Acute          +---------+---------------+---------+-----------+----------+--------------+ PERO     Partial                                      Acute          +---------+---------------+---------+-----------+----------+--------------+     Summary: RIGHT: - Findings consistent with acute deep vein thrombosis involving the right posterior tibial veins, and right peroneal veins. - No cystic structure found in the popliteal fossa.  LEFT: - Findings consistent with acute deep vein thrombosis involving the left posterior tibial veins, and left peroneal veins. - No cystic structure found in the popliteal fossa.  *See table(s) above for measurements and observations. Electronically signed by Servando Snare MD on 08/31/2019 at 2:44:40 PM.    Final    ECHOCARDIOGRAM LIMITED  Result Date: 09/01/2019    ECHOCARDIOGRAM LIMITED REPORT   Patient Name:   JAY HASKEW Date of Exam: 09/01/2019 Medical Rec #:  [id number]     Height:       76.0 in Accession #:    [id number]    Weight:       220.0 lb Date of Birth:  August 25, 1940      BSA:          2.307  m Patient Age:    79 years      BP:           135/68 mmHg Patient Gender: M             HR:           71 bpm. Exam Location:  Inpatient Procedure: Limited Echo, Cardiac Doppler, Color Doppler and Intracardiac            Opacification Agent Indications:  Pulmonary Embolus  History:        Patient has no prior history of Echocardiogram examinations.                 COPD, Signs/Symptoms:Shortness of Breath; Risk                 Factors:Dyslipidemia and Hypertension. COVID+, CKD.  Sonographer:    Dustin Flock Referring Phys: [phone] [facility]  Sonographer Comments: Technically difficult study due to poor echo windows. Image acquisition challenging due to COPD. IMPRESSIONS  1. Left ventricular ejection fraction, by estimation, is 60 to 65%. The left ventricle has normal function. Left ventricular endocardial border not optimally defined to evaluate regional wall motion.  2. Right ventricular systolic function is mildly reduced. The right ventricular size is mildly enlarged.  3. The mitral valve is grossly normal. Trivial mitral valve regurgitation.  4. The aortic valve was not well visualized. Aortic valve regurgitation is not visualized. No aortic stenosis is present.  5. The inferior vena cava is normal in size with greater than 50% respiratory variability, suggesting right atrial pressure of 3 mmHg. FINDINGS  Left Ventricle: Left ventricular ejection fraction, by estimation, is 60 to 65%. The left ventricle has normal function. Left ventricular endocardial border not optimally defined to evaluate regional wall motion. Definity contrast agent was given IV to delineate the left ventricular endocardial borders. The left ventricular internal cavity size was normal in size. Right Ventricle: The right ventricular size is mildly enlarged. Right ventricular systolic function is mildly reduced. Left Atrium: Left atrial size was not well visualized. Right Atrium: Right atrial size was not well visualized. Pericardium:  The pericardium was not well visualized. Mitral Valve: The mitral valve is grossly normal. Trivial mitral valve regurgitation. Tricuspid Valve: The tricuspid valve is not well visualized. Aortic Valve: The aortic valve was not well visualized. Aortic valve regurgitation is not visualized. No aortic stenosis is present. Aortic valve peak gradient measures 4.0 mmHg. Pulmonic Valve: The pulmonic valve was not assessed. Aorta: The aortic root was not well visualized. Venous: The inferior vena cava is normal in size with greater than 50% respiratory variability, suggesting right atrial pressure of 3 mmHg. IAS/Shunts: No atrial level shunt detected by color flow Doppler.  Diastology LV e' lateral:   7.51 cm/s LV E/e' lateral: 9.9 LV e' medial:    5.55 cm/s LV E/e' medial:  13.4  RIGHT VENTRICLE RV Basal diam:  3.50 cm RV S prime:     5.11 cm/s AORTIC VALVE AV Vmax:      99.90 cm/s AV Peak Grad: 4.0 mmHg MITRAL VALVE MV Area (PHT): 3.42 cm MV Decel Time: 222 msec MV E velocity: 74.60 cm/s MV A velocity: 83.10 cm/s MV E/A ratio:  0.90 Cherlynn Kaiser MD Electronically signed by Cherlynn Kaiser MD Signature Date/Time: 09/01/2019/5:13:05 PM    Final     Microbiology No results found for this or any previous visit (from the past 240 hour(s)).  Lab Basic Metabolic Panel: Recent Labs  Lab 09/06/19 0208 09/07/19 0213 09/08/19 0252 09/09/19 0305 09/10/19 0205  NA 138 137 138  --  142  K 4.1 4.9 5.0  --  4.3  CL 98 97* 97*  --  99  CO2 27 28 30   --  28  GLUCOSE 194* 182* 167*  --  168*  BUN 49* 49* 43*  --  62*  CREATININE 1.03 1.02 1.11  --  1.03  CALCIUM 9.3 9.1 8.9  --  9.2  MG  --   --  2.3  --   --   PHOS  --   --  3.3 3.1 3.5   Liver Function Tests: Recent Labs  Lab 09/08/19 0252  AST 35  ALT 37  ALKPHOS 81  BILITOT 1.2  PROT 6.6  ALBUMIN 2.5*   No results for input(s): LIPASE, AMYLASE in the last 168 hours. No results for input(s): AMMONIA in the last 168 hours. CBC: Recent Labs  Lab  09/06/19 0208 09/07/19 0213 09/08/19 0252  WBC 15.8* 14.1* 13.8*  HGB 12.1* 12.2* 11.6*  HCT 36.5* 37.8* 36.4*  MCV 92.4 92.9 94.8  PLT 443* 386 404*   Cardiac Enzymes: Recent Labs  Lab 09/08/19 0252  CKTOTAL 67  CKMB 7.7*   Sepsis Labs: Recent Labs  Lab 09/06/19 0208 09/07/19 0213 09/08/19 0252  WBC 15.8* 14.1* 13.8*  LATICACIDVEN  --   --  2.5*     Elliana Bal 09/12/2019, 6:23 PM

## 2019-09-13 NOTE — Progress Notes (Signed)
Patient was having break through with the medications we had for comfort. Contacted Doctor and requested Morphine gtt. Orders received. Explained to wife at bedside.

## 2019-09-13 NOTE — Progress Notes (Signed)
90 mL of morphine wasted post-mortem. Witnessed by Celene Skeen, rn

## 2019-09-13 DEATH — deceased

## 2020-01-18 ENCOUNTER — Telehealth: Payer: Self-pay | Admitting: Internal Medicine

## 2020-01-18 NOTE — Telephone Encounter (Signed)
    Spouse calling to request form completion  Forms from [employer] being sent to office in regards to Critical Illness claim   Fax [phone] or [phone]

## 2020-01-18 NOTE — Telephone Encounter (Signed)
I have not received the forms yet. I will keep a look out for them.

## 2020-01-18 NOTE — Telephone Encounter (Signed)
I have received the forms via fax.

## 2020-01-18 NOTE — Telephone Encounter (Signed)
Forms have been signed, Faxed to [employer] @ [phone], Copy sent to scan.   Wife has been informed and original mail for her records.

## 2020-01-18 NOTE — Telephone Encounter (Signed)
Forms have been completed and Placed in providers box to review and sign.  Spoke with provider in person, She agreed to sign form, Patient passed at hospital on 10-05-19.

## 2020-09-14 IMAGING — CR DG CHEST 2V
2 series · 2 of 2 positions shown · non-contrast
Comparison: Chest x-ray 10/11/2017.

CLINICAL DATA: 78-year-old male with history of cervical fusion.
Former smoker.

EXAM:
CHEST - 2 VIEW

[w chest pa]
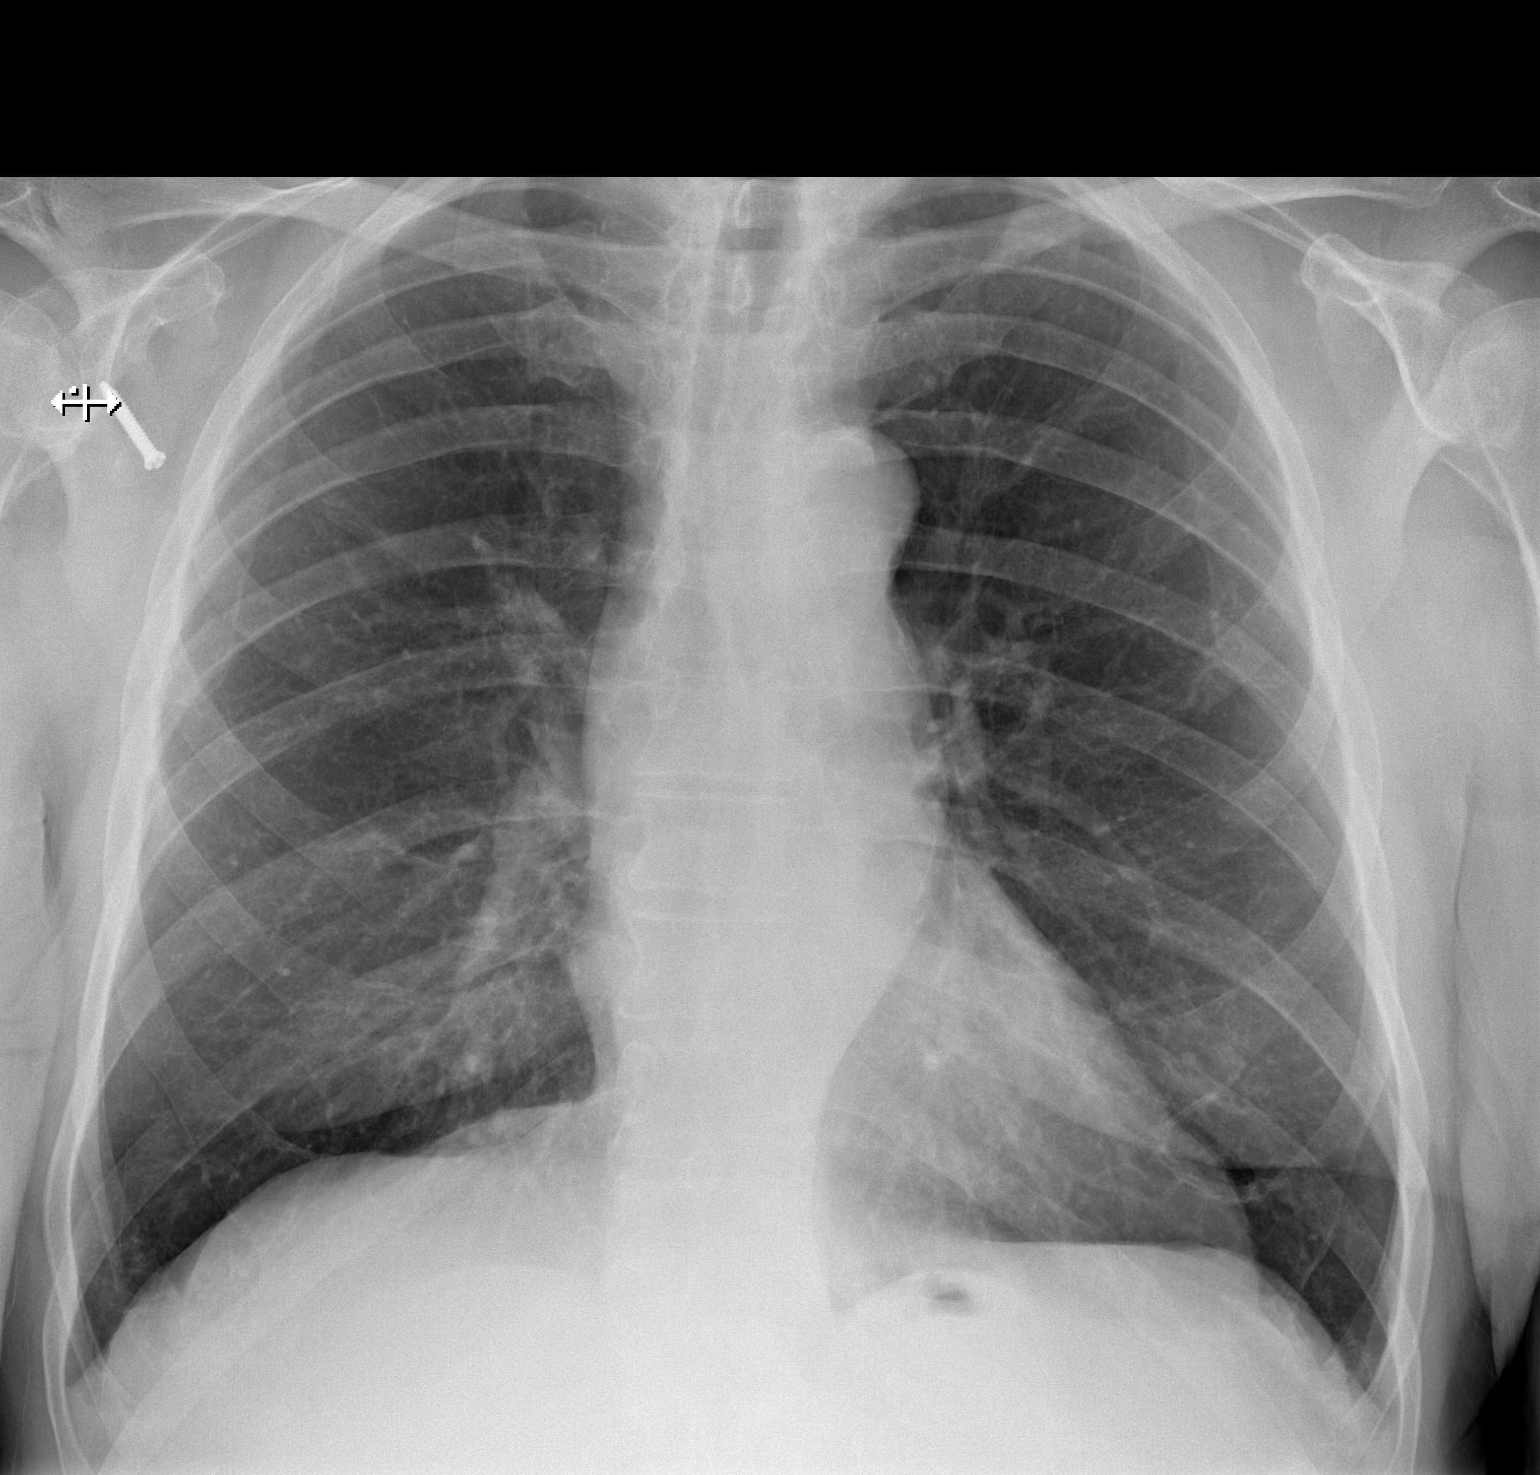

[w chest lat]
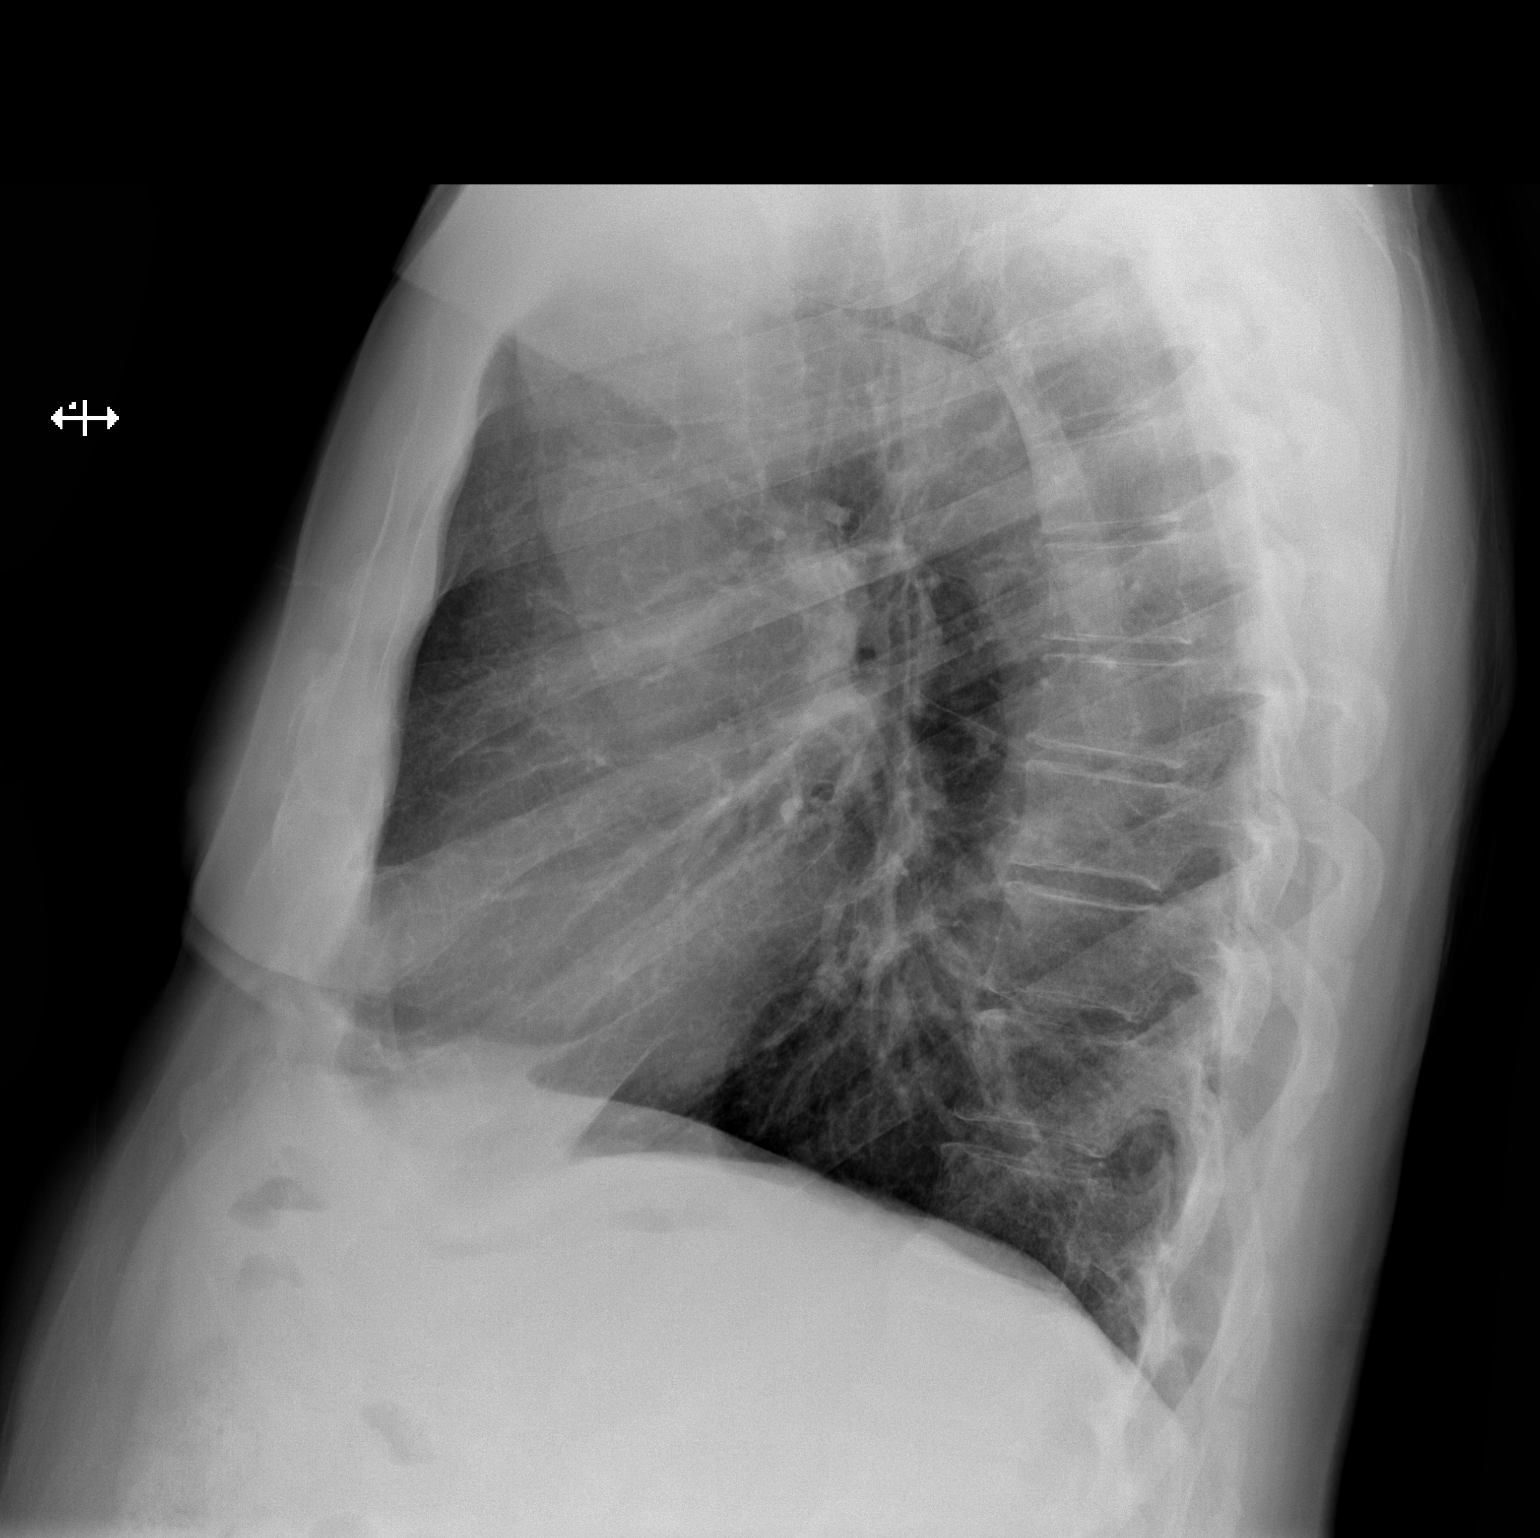

[2 of 2 positions shown; findings below may reference images not displayed]

FINDINGS: Lung volumes are normal. No consolidative airspace disease. No
pleural effusions. No pneumothorax. No pulmonary nodule or mass
noted. Pulmonary vasculature and the cardiomediastinal silhouette
are within normal limits. Aortic atherosclerosis.
IMPRESSION: 1.  No radiographic evidence of acute cardiopulmonary disease.
2. Aortic atherosclerosis.

## 2020-09-20 IMAGING — RF DG CERVICAL SPINE 2 OR 3 VIEWS
1 series · 3 of 3 positions shown · non-contrast
Comparison: None.

CLINICAL DATA: Intraoperative imaging for C5-7 ACDF.

EXAM:
CERVICAL SPINE - 2-3 VIEW; DG C-ARM 1-60 MIN

[Series 1: run · 3 of 3 slices shown]
[im 1/3]
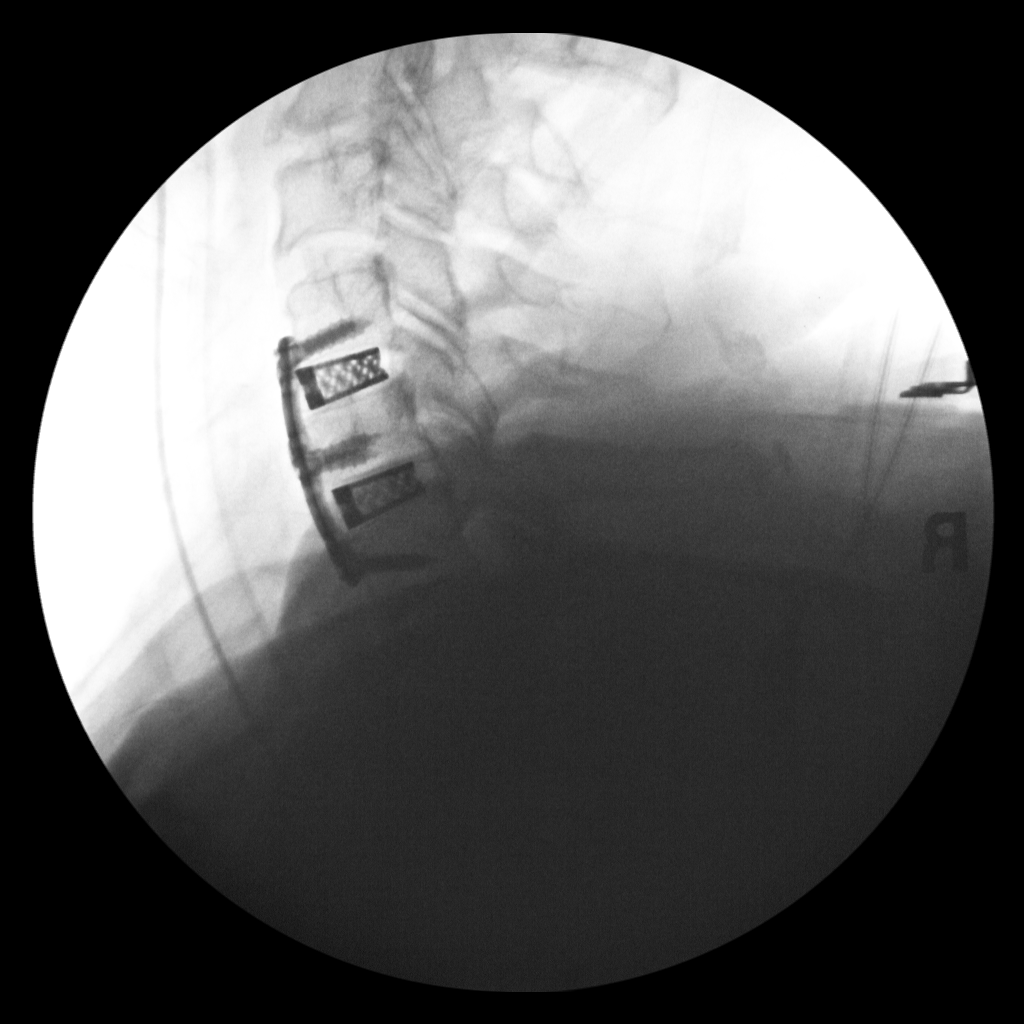
[im 2/3]
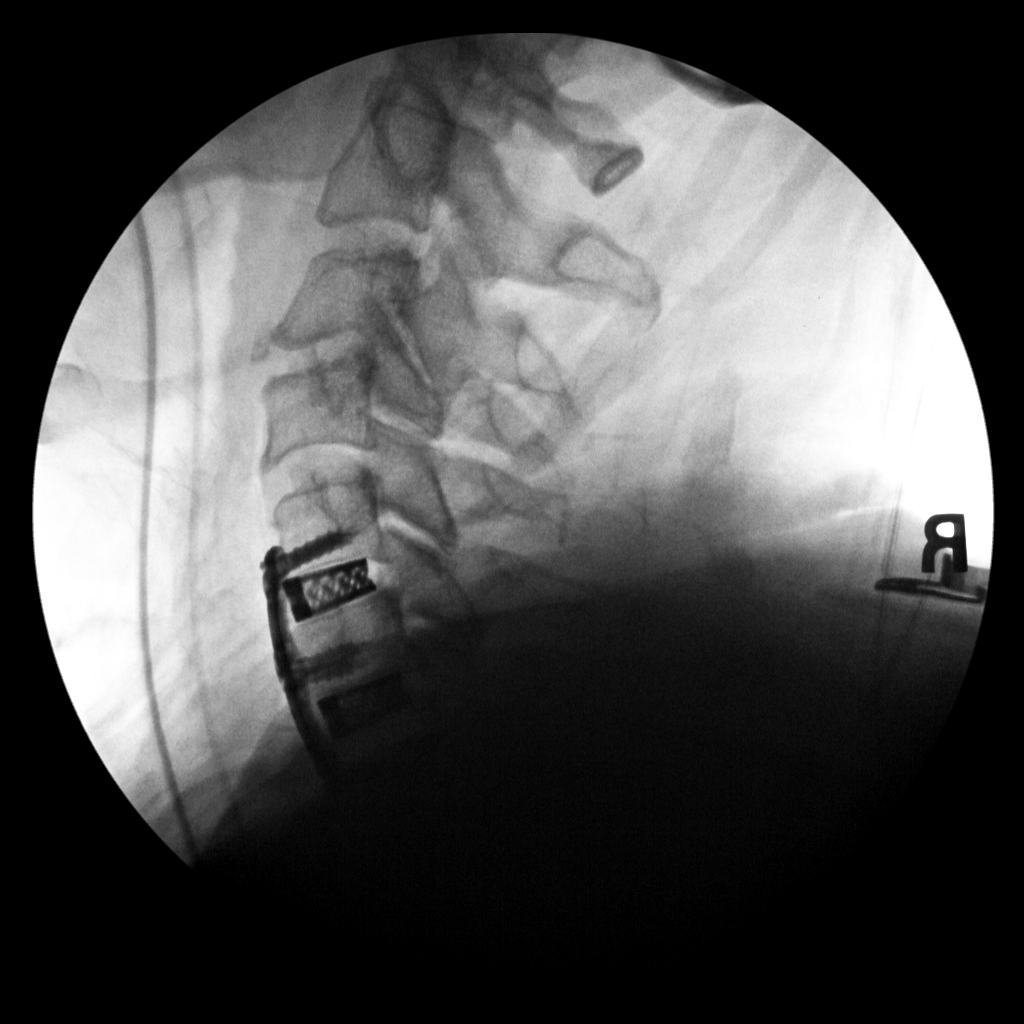
[im 3/3]
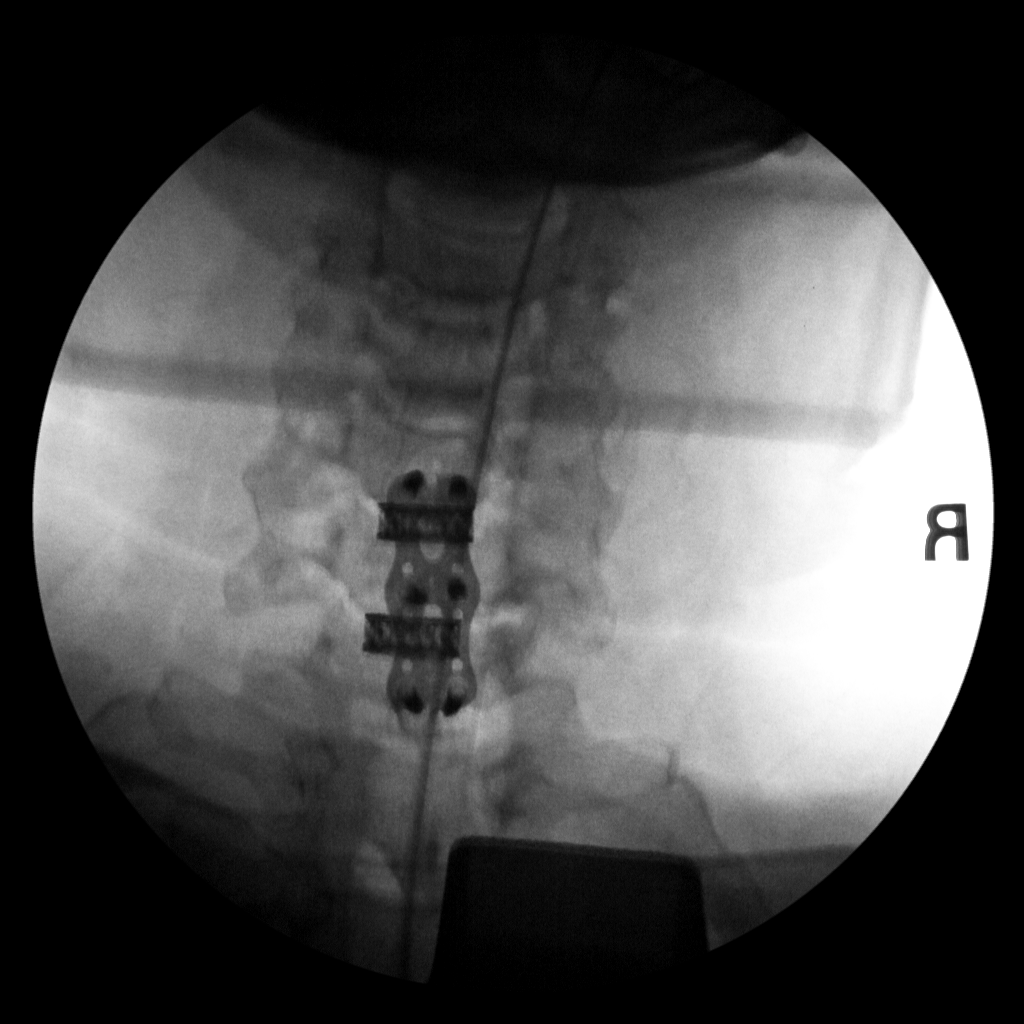

[3 of 3 positions shown; findings below may reference images not displayed]

FINDINGS: Three fluoroscopic spot views of the cervical spine demonstrate
anterior plate and screws and interbody spacers in place from C5-C7.
No acute abnormality.
IMPRESSION: Intraoperative imaging for C5-7 ACDF.

## 2020-09-20 IMAGING — RF DG C-ARM 1-60 MIN
1 series · 3 of 3 positions shown · non-contrast
Comparison: None.

CLINICAL DATA: Intraoperative imaging for C5-7 ACDF.

EXAM:
CERVICAL SPINE - 2-3 VIEW; DG C-ARM 1-60 MIN

[Series 1: run · 3 of 3 slices shown]
[im 1/3]
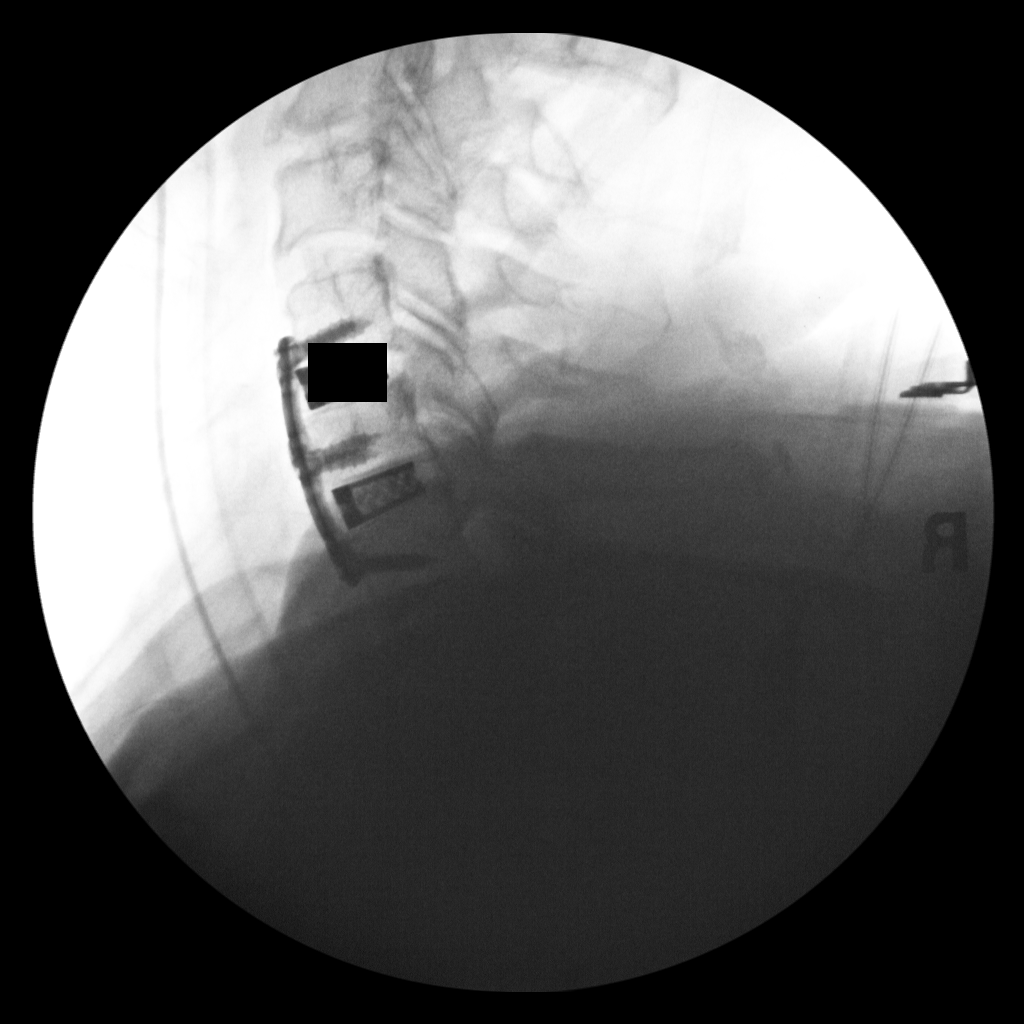
[im 2/3]
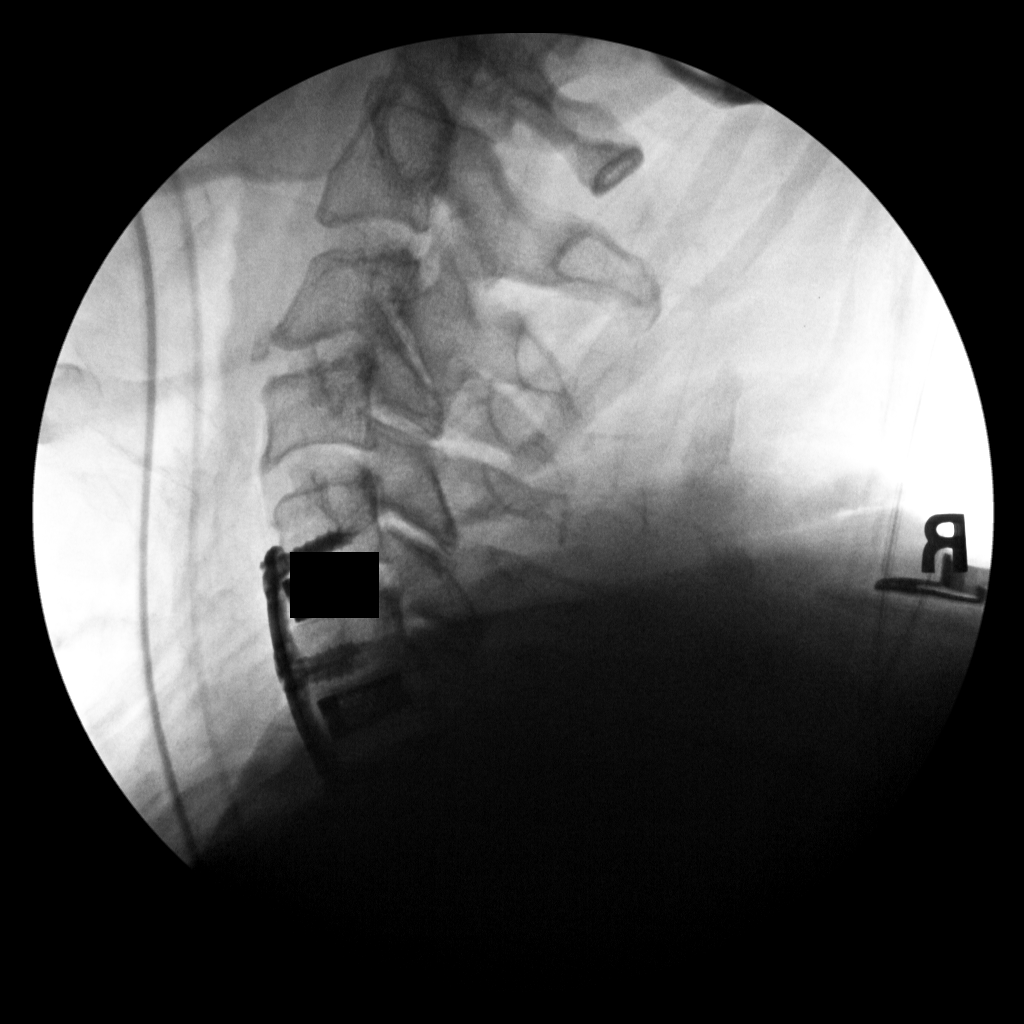
[im 3/3]
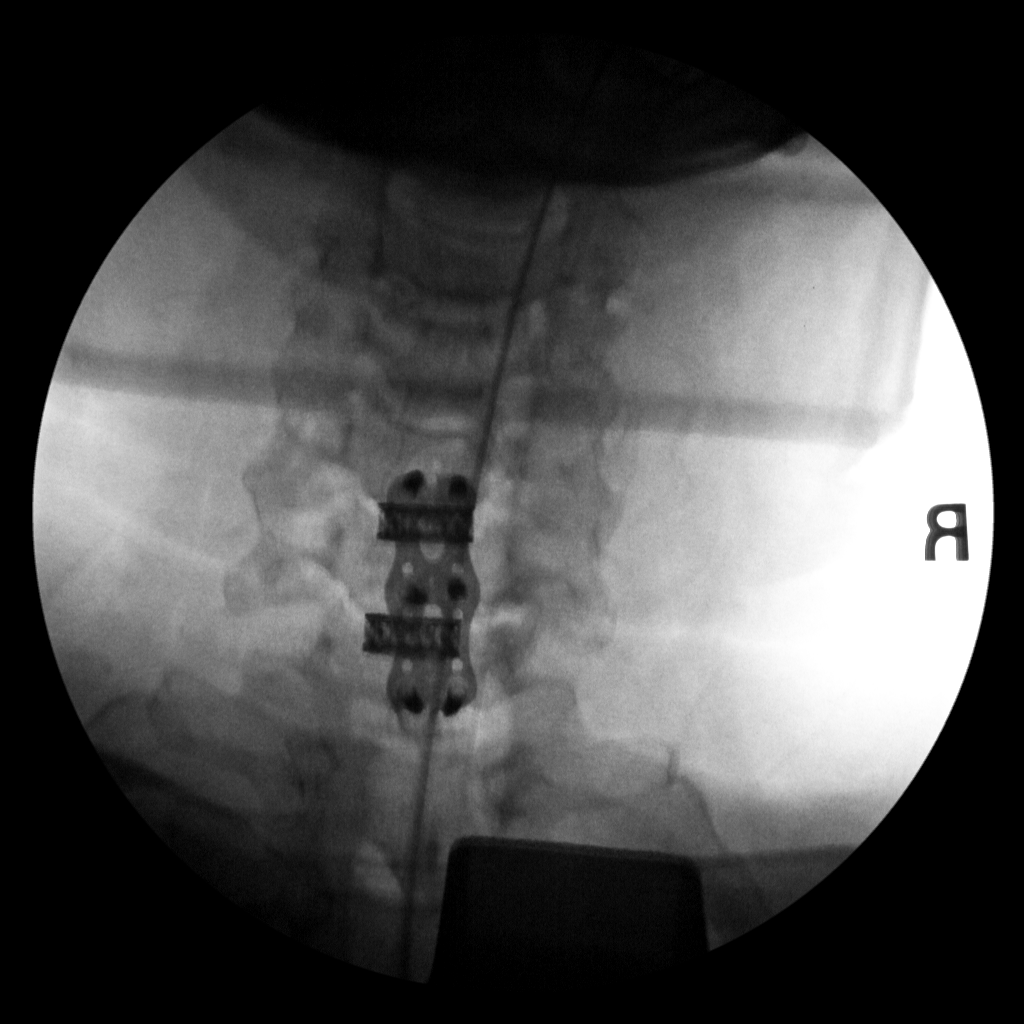

[3 of 3 positions shown; findings below may reference images not displayed]

FINDINGS: Three fluoroscopic spot views of the cervical spine demonstrate
anterior plate and screws and interbody spacers in place from C5-C7.
No acute abnormality.
IMPRESSION: Intraoperative imaging for C5-7 ACDF.
